# Patient Record
Sex: Female | Born: 1970 | Race: Black or African American | Hispanic: No | Marital: Single | Smoking: Never smoker
Health system: Southern US, Community
[De-identification: ages and names within clinical notes are randomized; demographics above are authoritative.]

## PROBLEM LIST (undated history)

## (undated) ENCOUNTER — Emergency Department (HOSPITAL_COMMUNITY): Payer: 59

## (undated) DIAGNOSIS — I639 Cerebral infarction, unspecified: Secondary | ICD-10-CM

## (undated) DIAGNOSIS — N939 Abnormal uterine and vaginal bleeding, unspecified: Secondary | ICD-10-CM

## (undated) DIAGNOSIS — J45909 Unspecified asthma, uncomplicated: Secondary | ICD-10-CM

## (undated) HISTORY — DX: Cerebral infarction, unspecified: I63.9

## (undated) HISTORY — PX: FOOT SURGERY: SHX648

## (undated) HISTORY — PX: TUBAL LIGATION: SHX77

---

## 2017-08-23 DIAGNOSIS — J45909 Unspecified asthma, uncomplicated: Secondary | ICD-10-CM | POA: Insufficient documentation

## 2021-05-20 ENCOUNTER — Other Ambulatory Visit: Payer: Self-pay

## 2021-05-20 ENCOUNTER — Ambulatory Visit (HOSPITAL_COMMUNITY)
Admission: RE | Admit: 2021-05-20 | Discharge: 2021-05-20 | Disposition: A | Discharge status: Home / Self Care | Payer: Self-pay | Source: Ambulatory Visit | Attending: Emergency Medicine | Admitting: Emergency Medicine

## 2021-05-20 ENCOUNTER — Encounter (HOSPITAL_COMMUNITY): Payer: Self-pay

## 2021-05-20 VITALS — BP 131/90 | HR 85 | Temp 98.5°F | Resp 17

## 2021-05-20 DIAGNOSIS — N939 Abnormal uterine and vaginal bleeding, unspecified: Secondary | ICD-10-CM | POA: Insufficient documentation

## 2021-05-20 LAB — POCT URINALYSIS DIPSTICK, ED / UC
Bilirubin Urine: NEGATIVE
Glucose, UA: NEGATIVE mg/dL
Ketones, ur: NEGATIVE mg/dL
Nitrite: NEGATIVE
Protein, ur: NEGATIVE mg/dL
Specific Gravity, Urine: 1.015 (ref 1.005–1.030)
Urobilinogen, UA: 1 mg/dL (ref 0.0–1.0)
pH: 6 (ref 5.0–8.0)

## 2021-05-20 LAB — POC URINE PREG, ED: Preg Test, Ur: NEGATIVE

## 2021-05-20 MED ORDER — MEGESTROL ACETATE 40 MG PO TABS: 40.0000 mg | ORAL_TABLET | Freq: Two times a day (BID) | ORAL | 3 refills | Status: DC

## 2021-05-20 NOTE — ED Provider Notes (Signed)
[facility]    CSN: [id number] Arrival date & time: 05/20/21  1646      History   Chief Complaint Chief Complaint  Patient presents with   APPOINTMENT : Vaginal Bleeding    HPI April Reilly is a 50 y.o. female.   Patient here for evaluation of abnormal vaginal bleeding and passing large clots for the past 2 weeks.  Patient reports that she does have uterine fibroids and has had irregular periods in the past.  Reports having a heavy period approximately 3 years ago but states her bleeding was heavy but without the clots.  Reports that she is not having to change tampons or pads frequently.  Denies any dizziness, weakness, fatigue, or paleness.  Patient does report that she moved from [place] several years ago and has not gotten established with a GYN locally.  Denies any trauma, injury, or other precipitating event.  Denies any specific alleviating or aggravating factors.  Denies any fevers, chest pain, shortness of breath, N/V/D, numbness, tingling, weakness, abdominal pain, or headaches.    The history is provided by the patient.   History reviewed. No pertinent past medical history.  There are no problems to display for this patient.   History reviewed. No pertinent surgical history.  OB History   No obstetric history on file.      Home Medications    Prior to Admission medications   Medication Sig Start Date End Date Taking? Authorizing Provider  megestrol (MEGACE) 40 MG tablet Take 1 tablet (40 mg total) by mouth 2 (two) times daily. 05/20/21  Yes Pearson Forster, NP    Family History Family History  Family history unknown: Yes    Social History Social History   Tobacco Use   Smoking status: Never   Smokeless tobacco: Never     Allergies   Patient has no known allergies.   Review of Systems Review of Systems  Genitourinary:  Positive for vaginal bleeding.  All other systems reviewed and are negative.   Physical Exam Triage Vital  Signs ED Triage Vitals  Enc Vitals Group     BP 05/20/21 1719 131/90     Pulse Rate 05/20/21 1719 85     Resp 05/20/21 1719 17     Temp 05/20/21 1719 98.5 F (36.9 C)     Temp Source 05/20/21 1719 Oral     SpO2 05/20/21 1719 100 %     Weight --      Height --      Head Circumference --      Peak Flow --      Pain Score 05/20/21 1724 2     Pain Loc --      Pain Edu? --      Excl. in [place]? --    No data found.  Updated Vital Signs BP 131/90 (BP Location: Left Arm)   Pulse 85   Temp 98.5 F (36.9 C) (Oral)   Resp 17   LMP 05/04/2021 Comment: Continous bleeding from 04/30/2021  SpO2 100%   Visual Acuity Right Eye Distance:   Left Eye Distance:   Bilateral Distance:    Right Eye Near:   Left Eye Near:    Bilateral Near:     Physical Exam Vitals and nursing note reviewed.  Constitutional:      General: She is not in acute distress.    Appearance: Normal appearance. She is not ill-appearing, toxic-appearing or diaphoretic.  HENT:     Head: Normocephalic  and atraumatic.  Eyes:     Conjunctiva/sclera: Conjunctivae normal.  Cardiovascular:     Rate and Rhythm: Normal rate.     Pulses: Normal pulses.  Pulmonary:     Effort: Pulmonary effort is normal.  Abdominal:     General: Abdomen is flat.     Tenderness: There is no right CVA tenderness or left CVA tenderness.  Musculoskeletal:        General: Normal range of motion.     Cervical back: Normal range of motion.  Skin:    General: Skin is warm and dry.  Neurological:     General: No focal deficit present.     Mental Status: She is alert and oriented to person, place, and time.  Psychiatric:        Mood and Affect: Mood normal.     UC Treatments / Results  Labs (all labs ordered are listed, but only abnormal results are displayed) Labs Reviewed  POCT URINALYSIS DIPSTICK, ED / UC - Abnormal; Notable for the following components:      Result Value   Hgb urine dipstick LARGE (*)    Leukocytes,Ua SMALL (*)     All other components within normal limits  URINE CULTURE  POC URINE PREG, ED    EKG   Radiology No results found.  Procedures Procedures (including critical care time)  Medications Ordered in UC Medications - No data to display  Initial Impression / Assessment and Plan / UC Course  I have reviewed the triage vital signs and the nursing notes.  Pertinent labs & imaging results that were available during my care of the patient were reviewed by me and considered in my medical decision making (see chart for details).    Assessment negative for red flags or concerns.  Urinalysis was positive for hemoglobin and leukocytes so we will obtain a urine culture.  Patient declined CBC to check hemoglobin and as she denies any dizziness or fatigue and is not pale I think her hemoglobin is likely fine.  We will treat abnormal vaginal bleeding with Megace twice daily.  Patient was instructed to continue medication until she has been seen by a gynecologist.  Referral to OB/GYN placed. Final Clinical Impressions(s) / UC Diagnoses   Final diagnoses:  Abnormal vaginal bleeding     Discharge Instructions      Take the Megace twice a day until you are seen by a gynecologist.  You can take Tylenol and/or ibuprofen as needed for pain relief and fever reduction. Make sure you are drinking plenty of fluids, especially water.  Follow-up with the gynecologist as soon as possible for reevaluation.  A referral has been placed so someone should be contacting you in the next few days but if you do not hear anything please call the number provided.     ED Prescriptions     Medication Sig Dispense Auth. Provider   megestrol (MEGACE) 40 MG tablet Take 1 tablet (40 mg total) by mouth 2 (two) times daily. 60 tablet Pearson Forster, NP      PDMP not reviewed this encounter.   Pearson Forster, NP 05/20/21 [phone]

## 2021-05-20 NOTE — Discharge Instructions (Addendum)
Take the Megace twice a day until you are seen by a gynecologist.  You can take Tylenol and/or ibuprofen as needed for pain relief and fever reduction. Make sure you are drinking plenty of fluids, especially water.  Follow-up with the gynecologist as soon as possible for reevaluation.  A referral has been placed so someone should be contacting you in the next few days but if you do not hear anything please call the number provided.

## 2021-05-20 NOTE — ED Triage Notes (Signed)
Pt presents with abnormal vaginal bleeding for over 2 weeks with passing huge clots.

## 2021-05-21 LAB — URINE CULTURE

## 2021-08-21 ENCOUNTER — Encounter (HOSPITAL_COMMUNITY): Payer: Self-pay

## 2021-08-21 ENCOUNTER — Emergency Department (HOSPITAL_COMMUNITY): Payer: 59

## 2021-08-21 ENCOUNTER — Emergency Department (HOSPITAL_COMMUNITY)
Admission: EM | Admit: 2021-08-21 | Discharge: 2021-08-21 | Disposition: A | Discharge status: Home / Self Care | Payer: 59 | Attending: Emergency Medicine | Admitting: Emergency Medicine

## 2021-08-21 ENCOUNTER — Other Ambulatory Visit: Payer: Self-pay

## 2021-08-21 DIAGNOSIS — R000 Tachycardia, unspecified: Secondary | ICD-10-CM | POA: Insufficient documentation

## 2021-08-21 DIAGNOSIS — N938 Other specified abnormal uterine and vaginal bleeding: Secondary | ICD-10-CM | POA: Diagnosis not present

## 2021-08-21 DIAGNOSIS — D649 Anemia, unspecified: Secondary | ICD-10-CM | POA: Diagnosis not present

## 2021-08-21 DIAGNOSIS — R110 Nausea: Secondary | ICD-10-CM | POA: Insufficient documentation

## 2021-08-21 DIAGNOSIS — R1032 Left lower quadrant pain: Secondary | ICD-10-CM | POA: Diagnosis present

## 2021-08-21 DIAGNOSIS — R102 Pelvic and perineal pain: Secondary | ICD-10-CM

## 2021-08-21 HISTORY — DX: Unspecified asthma, uncomplicated: J45.909

## 2021-08-21 LAB — COMPREHENSIVE METABOLIC PANEL
ALT: 16 U/L (ref 0–44)
AST: 25 U/L (ref 15–41)
Albumin: 4.1 g/dL (ref 3.5–5.0)
Alkaline Phosphatase: 63 U/L (ref 38–126)
Anion gap: 8 (ref 5–15)
BUN: 10 mg/dL (ref 6–20)
CO2: 21 mmol/L — ABNORMAL LOW (ref 22–32)
Calcium: 9.2 mg/dL (ref 8.9–10.3)
Chloride: 108 mmol/L (ref 98–111)
Creatinine, Ser: 0.77 mg/dL (ref 0.44–1.00)
GFR, Estimated: >60 mL/min (ref >60)
Glucose, Bld: 102 mg/dL — ABNORMAL HIGH (ref 70–99)
Potassium: 3.7 mmol/L (ref 3.5–5.1)
Sodium: 137 mmol/L (ref 135–145)
Total Bilirubin: 0.9 mg/dL (ref 0.3–1.2)
Total Protein: 8.3 g/dL — ABNORMAL HIGH (ref 6.5–8.1)

## 2021-08-21 LAB — CBC
HCT: 29.7 % — ABNORMAL LOW (ref 36.0–46.0)
Hemoglobin: 8.8 g/dL — ABNORMAL LOW (ref 12.0–15.0)
MCH: 19.5 pg — ABNORMAL LOW (ref 26.0–34.0)
MCHC: 29.6 g/dL — ABNORMAL LOW (ref 30.0–36.0)
MCV: 65.7 fL — ABNORMAL LOW (ref 80.0–100.0)
Platelets: 287 10*3/uL (ref 150–400)
RBC: 4.52 MIL/uL (ref 3.87–5.11)
RDW: 19 % — ABNORMAL HIGH (ref 11.5–15.5)
WBC: 15 10*3/uL — ABNORMAL HIGH (ref 4.0–10.5)
nRBC: 0 % (ref 0.0–0.2)

## 2021-08-21 LAB — URINALYSIS, ROUTINE W REFLEX MICROSCOPIC
Bilirubin Urine: NEGATIVE
Glucose, UA: NEGATIVE mg/dL
Ketones, ur: NEGATIVE mg/dL
Leukocytes,Ua: NEGATIVE
Nitrite: NEGATIVE
Specific Gravity, Urine: 1.01 (ref 1.005–1.030)
pH: 8 (ref 5.0–8.0)

## 2021-08-21 LAB — WET PREP, GENITAL
Clue Cells Wet Prep HPF POC: NONE SEEN
Sperm: NONE SEEN
Trich, Wet Prep: NONE SEEN
WBC, Wet Prep HPF POC: <10 (ref <10)
Yeast Wet Prep HPF POC: NONE SEEN

## 2021-08-21 LAB — MAGNESIUM: Magnesium: 2 mg/dL (ref 1.7–2.4)

## 2021-08-21 LAB — LACTIC ACID, PLASMA
Lactic Acid, Venous: 2 mmol/L (ref 0.5–1.9)
Lactic Acid, Venous: 1.6 mmol/L (ref 0.5–1.9)

## 2021-08-21 LAB — URINALYSIS, MICROSCOPIC (REFLEX)
RBC / HPF: >50 RBC/hpf (ref 0–5)
WBC, UA: NONE SEEN WBC/hpf (ref 0–5)

## 2021-08-21 LAB — TROPONIN I (HIGH SENSITIVITY)
Troponin I (High Sensitivity): <2 ng/L (ref <18)
Troponin I (High Sensitivity): 2 ng/L (ref <18)

## 2021-08-21 LAB — POC URINE PREG, ED: Preg Test, Ur: NEGATIVE

## 2021-08-21 LAB — LIPASE, BLOOD: Lipase: 46 U/L (ref 11–51)

## 2021-08-21 MED ORDER — ONDANSETRON HCL 4 MG/2ML IJ SOLN: 4.0000 mg | Freq: Once | INTRAMUSCULAR | Status: AC

## 2021-08-21 MED ORDER — LACTATED RINGERS IV BOLUS: 1000.0000 mL | Freq: Once | INTRAVENOUS | Status: DC

## 2021-08-21 MED ORDER — KETOROLAC TROMETHAMINE 15 MG/ML IJ SOLN
15.0000 mg | Freq: Once | INTRAMUSCULAR | Status: AC
  Administered 2021-08-21: 15 mg via INTRAVENOUS
  Filled 2021-08-21: qty 1

## 2021-08-21 MED ORDER — HYDROMORPHONE HCL 1 MG/ML IJ SOLN
1.0000 mg | Freq: Once | INTRAMUSCULAR | Status: AC
  Administered 2021-08-21: 1 mg via INTRAVENOUS
  Filled 2021-08-21: qty 1

## 2021-08-21 MED ORDER — LACTATED RINGERS IV BOLUS
1000.0000 mL | Freq: Once | INTRAVENOUS | Status: AC
  Administered 2021-08-21: 1000 mL via INTRAVENOUS

## 2021-08-21 MED ORDER — IOHEXOL 350 MG/ML SOLN
100.0000 mL | Freq: Once | INTRAVENOUS | Status: AC | PRN
  Administered 2021-08-21: 100 mL via INTRAVENOUS

## 2021-08-21 MED ORDER — LACTATED RINGERS IV BOLUS
1000.0000 mL | Freq: Once | INTRAVENOUS | Status: AC
Start: 2021-08-21 — End: 2021-08-21
  Administered 2021-08-21: 1000 mL via INTRAVENOUS

## 2021-08-21 MED ORDER — OXYCODONE-ACETAMINOPHEN 5-325 MG PO TABS: 1.0000 | ORAL_TABLET | Freq: Three times a day (TID) | ORAL | 0 refills | Status: DC | PRN

## 2021-08-21 MED ORDER — ONDANSETRON HCL 4 MG/2ML IJ SOLN
INTRAMUSCULAR | Status: AC
  Administered 2021-08-21: 4 mg via INTRAVENOUS
  Filled 2021-08-21: qty 2

## 2021-08-21 MED ORDER — ONDANSETRON HCL 4 MG/2ML IJ SOLN
4.0000 mg | Freq: Once | INTRAMUSCULAR | Status: AC
  Administered 2021-08-21: 4 mg via INTRAVENOUS
  Filled 2021-08-21: qty 2

## 2021-08-21 NOTE — ED Notes (Signed)
Patient Alert and oriented to baseline. Stable and ambulatory to baseline. Patient verbalized understanding of the discharge instructions.  Patient belongings were taken by the patient.   

## 2021-08-21 NOTE — ED Triage Notes (Signed)
Patient complaining of lower left quadrant abdominal pain with nausea and back pain that started the previous day. Has had abnormal vaginal bleeding for the past couple months.

## 2021-08-21 NOTE — ED Provider Notes (Signed)
[facility] EMERGENCY DEPARTMENT Provider Note   CSN: [id number] Arrival date & time: 08/21/21  0160     History  Chief Complaint  Patient presents with   Abdominal Pain    April Reilly is a 51 y.o. female.   Abdominal Pain Associated symptoms: nausea   Patient presents for left lower quadrant abdominal pain and nausea.  Medical history is notable for abnormal uterine bleeding for the past 3 months.  Patient was previously living in [place] and moved to the local area within the last 2 years.  She was seen by OB/GYN in October for abnormal vaginal bleeding.  She was started on Megace which she continues to take.  She denies any other daily medications.  She has had improvement in her uterine bleeding but states that she still has persistent spotting.  Yesterday, while at rest, she developed the acute onset of left lower quadrant/left pelvic pain.  This pain has been persistent since that time.  She has had some associated nausea but has not had vomiting.  She has not had any fevers or chills.  Pain does radiate to the area of her lower back.  She has not had any recent dysuria.  She has never experienced pain like this before.  She still has all of her organs.  She has undergone tubal ligation in the past.    Home Medications Prior to Admission medications   Medication Sig Start Date End Date Taking? Authorizing Provider  ibuprofen (ADVIL) 200 MG tablet Take 200 mg by mouth every 6 (six) hours as needed for mild pain.   Yes [provider]  megestrol (MEGACE) 40 MG tablet Take 1 tablet (40 mg total) by mouth 2 (two) times daily. 05/20/21  Yes Pearson Forster, NP  oxyCODONE-acetaminophen (PERCOCET/ROXICET) 5-325 MG tablet Take 1-2 tablets by mouth every 8 (eight) hours as needed for severe pain. 08/21/21  Yes Davonna Belling, MD      Allergies    Patient has no known allergies.    Review of Systems   Review of Systems  Gastrointestinal:  Positive for abdominal pain and  nausea.  Genitourinary:  Positive for pelvic pain.  Musculoskeletal:  Positive for back pain.  All other systems reviewed and are negative.  Physical Exam Updated Vital Signs BP 109/73    Pulse (!) 101    Temp 99.4 F (37.4 C) (Oral)    Resp 18    Ht 4\' 11"  (1.499 m)    Wt 66.7 kg    SpO2 99%    BMI 29.69 kg/m  Physical Exam Vitals and nursing note reviewed. Exam conducted with a chaperone present.  Constitutional:      General: She is not in acute distress.    Appearance: She is well-developed and normal weight. She is not ill-appearing, toxic-appearing or diaphoretic.  HENT:     Head: Normocephalic and atraumatic.     Mouth/Throat:     Mouth: Mucous membranes are moist.     Pharynx: Oropharynx is clear.  Eyes:     General: No scleral icterus.    Conjunctiva/sclera: Conjunctivae normal.     Pupils: Pupils are equal, round, and reactive to light.  Cardiovascular:     Rate and Rhythm: Regular rhythm. Tachycardia present.     Heart sounds: No murmur heard. Pulmonary:     Effort: Pulmonary effort is normal. No respiratory distress.     Breath sounds: Normal breath sounds. No wheezing or rales.  Abdominal:  Palpations: Abdomen is soft.     Tenderness: There is abdominal tenderness in the left lower quadrant. There is right CVA tenderness. There is no left CVA tenderness, guarding or rebound.     Hernia: No hernia is present.  Genitourinary:    General: Normal vulva.     Vagina: Normal. No foreign body. No vaginal discharge, erythema or bleeding.     Cervix: No cervical motion tenderness, discharge, friability, erythema or cervical bleeding.  Musculoskeletal:        General: No swelling.     Cervical back: Neck supple.  Skin:    General: Skin is warm and dry.     Capillary Refill: Capillary refill takes less than 2 seconds.  Neurological:     General: No focal deficit present.     Mental Status: She is alert and oriented to person, place, and time.     Cranial Nerves: No  cranial nerve deficit.     Motor: No weakness.  Psychiatric:        Mood and Affect: Mood normal.        Behavior: Behavior normal.    ED Results / Procedures / Treatments   Labs (all labs ordered are listed, but only abnormal results are displayed) Labs Reviewed  COMPREHENSIVE METABOLIC PANEL - Abnormal; Notable for the following components:      Result Value   CO2 21 (*)    Glucose, Bld 102 (*)    Total Protein 8.3 (*)    All other components within normal limits  CBC - Abnormal; Notable for the following components:   WBC 15.0 (*)    Hemoglobin 8.8 (*)    HCT 29.7 (*)    MCV 65.7 (*)    MCH 19.5 (*)    MCHC 29.6 (*)    RDW 19.0 (*)    All other components within normal limits  URINALYSIS, ROUTINE W REFLEX MICROSCOPIC - Abnormal; Notable for the following components:   Hgb urine dipstick LARGE (*)    Protein, ur TRACE (*)    All other components within normal limits  LACTIC ACID, PLASMA - Abnormal; Notable for the following components:   Lactic Acid, Venous 2.0 (*)    All other components within normal limits  URINALYSIS, MICROSCOPIC (REFLEX) - Abnormal; Notable for the following components:   Bacteria, UA RARE (*)    All other components within normal limits  WET PREP, GENITAL  LIPASE, BLOOD  LACTIC ACID, PLASMA  MAGNESIUM  POC URINE PREG, ED  GC/CHLAMYDIA PROBE AMP ([place]) NOT AT [facility]  TROPONIN I (HIGH SENSITIVITY)  TROPONIN I (HIGH SENSITIVITY)    EKG EKG Interpretation  Date/Time:  Tuesday August 21 2021 08:30:54 EST Ventricular Rate:  115 PR Interval:  132 QRS Duration: 94 QT Interval:  324 QTC Calculation: 447 R Axis:   -1 Text Interpretation: Sinus tachycardia Anterior infarct, old Confirmed by Godfrey Pick ([id number]) on 08/21/2021 9:40:05 AM  Radiology US PELVIC COMPLETE WITH TRANSVAGINAL  Result Date: 08/21/2021 CLINICAL DATA:  Left lower quadrant pain, nausea and vomiting EXAM: TRANSABDOMINAL AND TRANSVAGINAL ULTRASOUND OF PELVIS DOPPLER  ULTRASOUND OF OVARIES TECHNIQUE: Both transabdominal and transvaginal ultrasound examinations of the pelvis were performed. Transabdominal technique was performed for global imaging of the pelvis including uterus, ovaries, adnexal regions, and pelvic cul-de-sac. It was necessary to proceed with endovaginal exam following the transabdominal exam to visualize the uterus/endometrium and ovaries. Color and duplex Doppler ultrasound was utilized to evaluate blood flow to the ovaries. COMPARISON:  Same  day CT FINDINGS: Uterus Measurements: 10.3 x 4.7 x 5.5 cm = volume: 140.0 mL. There is a large fundal uterine fibroid measuring 11.5 x 10.6 x 11.5 cm. Endometrium Thickness: 10 mm. This is considered abnormal if postmenopausal and can be normal if premenopausal. Right ovary Not visualized. Left ovary Measurements: 3.0 x 1.2 x 2.9 cm = volume: 5.26 mL. Normal appearance/no adnexal mass. Pulsed Doppler evaluation of both ovaries demonstrates normal low-resistance arterial and venous waveforms. Other findings Trace simple appearing free fluid in the pelvis, likely physiologic. IMPRESSION: No evidence of left-sided ovarian torsion. The right ovary is not visualized. Large fundal uterine fibroid measuring up to 11.5 x 10.6 x 11.5 cm. Endometrial thickness measures 10 mm. This is normal if premenopausal but abnormal if postmenopausal. Electronically Signed   By: Maurine Simmering M.D.   On: 08/21/2021 16:02   CT Angio Chest/Abd/Pel for Dissection W and/or Wo Contrast  Result Date: 08/21/2021 CLINICAL DATA:  Chest pain, abdominal pain, radiating to back EXAM: CT ANGIOGRAPHY CHEST, ABDOMEN AND PELVIS TECHNIQUE: Noncontrast CT of the chest was initially obtained. Multidetector CT imaging through the chest, abdomen and pelvis was performed using the standard protocol during bolus administration of intravenous contrast. Multiplanar reconstructed images and MIPs were obtained and reviewed to evaluate the vascular anatomy. RADIATION DOSE  REDUCTION: This exam was performed according to the departmental dose-optimization program which includes automated exposure control, adjustment of the mA and/or kV according to patient size and/or use of iterative reconstruction technique. CONTRAST:  120mL OMNIPAQUE IOHEXOL 350 MG/ML SOLN COMPARISON:  None. FINDINGS: CTA CHEST FINDINGS Cardiovascular: Heart size normal. No pericardial effusion. RV nondilated. Satisfactory opacification of pulmonary arteries noted, and there is no evidence of pulmonary emboli. Adequate contrast opacification of the thoracic aorta with no evidence of dissection, aneurysm, or stenosis. There is classic 3-vessel brachiocephalic arch anatomy without proximal stenosis. Mediastinum/Nodes: No mediastinal hematoma, mass or adenopathy. Lungs/Pleura: No pleural effusion. Lungs are clear. No pneumothorax. Musculoskeletal: There is a dextroscoliosis of the thoracic spine apex T9 without evident underlying vertebral anomaly. Bilateral breast implants. Review of the MIP images confirms the above findings. CTA ABDOMEN AND PELVIS FINDINGS VASCULAR Aorta: Normal caliber aorta without aneurysm, dissection, vasculitis or significant stenosis. Celiac: Patent without evidence of aneurysm, dissection, vasculitis or significant stenosis. SMA: Patent without evidence of aneurysm, dissection, vasculitis or significant stenosis. Renals: Both renal arteries are patent without evidence of aneurysm, dissection, vasculitis, fibromuscular dysplasia or significant stenosis. IMA: Patent without evidence of aneurysm, dissection, vasculitis or significant stenosis. Inflow: Patent without evidence of aneurysm, dissection, vasculitis or significant stenosis. Markedly enlarged bilateral uterine arteries and distal branches supplying large uterine fibroids. Veins: No obvious venous abnormality within the limitations of this arterial phase study. Review of the MIP images confirms the above findings. NON-VASCULAR  Hepatobiliary: No focal liver abnormality is seen. No gallstones, gallbladder wall thickening, or biliary dilatation. Pancreas: Unremarkable. No pancreatic ductal dilatation or surrounding inflammatory changes. Spleen: Normal in size without focal abnormality. Adrenals/Urinary Tract: Adrenal glands are unremarkable. Kidneys are normal, without renal calculi, focal lesion, or hydronephrosis. Bladder is unremarkable. Stomach/Bowel: Stomach incompletely distended, unremarkable. Small bowel decompressed. Normal appendix. The colon is nondilated, unremarkable. Lymphatic: No abdominal or pelvic adenopathy. Reproductive: Marked uterine enlargement containing multiple low-attenuation lesions presumably fibroids, with enlargement of bilateral uterine arterial supply. No adnexal mass. Other: No ascites.  No free air. Musculoskeletal: There is a mild compensatory levoscoliosis in the lumbar spine. Review of the MIP images confirms the above findings. IMPRESSION: 1. Negative for  acute PE or thoracic aortic dissection. 2. Enlarged myomatous uterus 3. Moderate thoracic dextroscoliosis Electronically Signed   By: Lucrezia Europe M.D.   On: 08/21/2021 10:58    Procedures Procedures    Medications Ordered in ED Medications  HYDROmorphone (DILAUDID) injection 1 mg (1 mg Intravenous Given 08/21/21 1014)  lactated ringers bolus 1,000 mL (0 mLs Intravenous Stopped 08/21/21 1255)  ondansetron (ZOFRAN) injection 4 mg (4 mg Intravenous Given 08/21/21 1014)  iohexol (OMNIPAQUE) 350 MG/ML injection 100 mL (100 mLs Intravenous Contrast Given 08/21/21 1038)  HYDROmorphone (DILAUDID) injection 1 mg (1 mg Intravenous Given 08/21/21 1254)  ketorolac (TORADOL) 15 MG/ML injection 15 mg (15 mg Intravenous Given 08/21/21 1708)  lactated ringers bolus 1,000 mL (0 mLs Intravenous Stopped 08/21/21 1742)  ondansetron (ZOFRAN) injection 4 mg (4 mg Intravenous Given 08/21/21 1635)    ED Course/ Medical Decision Making/ A&P                            Medical Decision Making Amount and/or Complexity of Data Reviewed Labs: ordered. Radiology: ordered.  Risk Prescription drug management.   This patient presents to the ED for concern of left lower quadrant abdominal pain, this involves an extensive number of treatment options, and is a complaint that carries with it a high risk of complications and morbidity.  The differential diagnosis includes diverticulitis, constipation, PID, ovarian torsion, ovarian cystic rupture   Co morbidities that complicate the patient evaluation  Anemia, abnormal uterine bleeding   Additional history obtained:  Additional history obtained from N/A External records from outside source obtained and reviewed including EMR   Lab Tests:  I Ordered, and personally interpreted labs.  The pertinent results include: Anemia, leukocytosis   Imaging Studies ordered:  I ordered imaging studies including CT angio of chest, abdomen, and pelvis; transvaginal pelvic ultrasound I independently visualized and interpreted imaging which showed presence of fibroids without acute findings to explain patient's pain I agree with the radiologist interpretation   Cardiac Monitoring:  The patient was maintained on a cardiac monitor.  I personally viewed and interpreted the cardiac monitored which showed an underlying rhythm of: Sinus rhythm   Medicines ordered and prescription drug management:  I ordered medication including Dilaudid for analgesia, Zofran for nausea; IV fluids for tachycardia; Toradol for further analgesia Reevaluation of the patient after these medicines showed that the patient improved I have reviewed the patients home medicines and have made adjustments as needed  Problem List / ED Course:  Healthy 51 year old female presenting for cute onset of left lower quadrant/left pelvic pain starting yesterday.  She has had ongoing abnormal uterine bleeding for the past 3 months.  She does state that she  has a history of anemia but does not know what her baseline hemoglobin is.  She was previously seen by medical providers in [place].  She moved to the local area 2 years ago.  Since that time, she has only been seen by OB/GYN last October for her abnormal uterine bleeding.  No previous lab work is available in EMR.  On arrival in the ED, she does appear uncomfortable but is otherwise well-appearing.  Dilaudid was ordered for analgesia.  Vital signs are notable for tachycardia.  IV fluids were given.  On exam, she has tenderness in the left pelvic region as well as the right CVA region.  Patient underwent lab work which did not show any evidence of urinary infection.  There was hematuria  which I suspect is secondary to her ongoing, mild uterine bleeding.  Today, patient has a microcytic anemia with a hemoglobin of 8.8.  Patient is not aware of what her baseline hemoglobin is.  She states that the uterine bleeding she has had over the past several months has been minimal with spotting only.  Additional lab findings show a leukocytosis.  This is nonspecific but does raise concern of possible infection.  Given her abdominal pain that radiates to her back, patient underwent a CT angiogram of the chest, abdomen, and pelvis.  This did not show any vessel abnormalities.  It did show evidence of uterine fibroids, which may be related to her pain and is likely related to her ongoing uterine bleeding.  There were, however, no definitive acute findings to explain her pain.  Following this scan, patient had improved but persistent pain.  Additional Dilaudid was given.  Patient underwent a pelvic ultrasound.  Ultrasound was able to visualize the left adnexa and did not show evidence of ovarian torsion.  There was a trace amount of free pelvic fluid which might suggest a cystic rupture causing her pain.  On pelvic exam, with nurse chaperone present, there was a small amount of serosanguineous fluid in the vaginal vault.  No  active bleeding was identified.  There was no evidence of friability or purulence to suggest cervicitis.  Swabs were obtained.  At this time, patient remained tachycardic.  Additional IV fluids were given.  Toradol was ordered for further analgesia.  Care of patient was signed out to oncoming ED provider.   Reevaluation:  After the interventions noted above, I reevaluated the patient and found that they have :improved   Social Determinants of Health:  Patient recently moved to the local area 2 years ago and has not yet established primary care doctor.   Dispostion:  After consideration of the diagnostic results and the patients response to treatment, I feel that the patent would benefit from reassessment.          Final Clinical Impression(s) / ED Diagnoses Final diagnoses:  Pelvic pain  Dysfunctional uterine bleeding  Anemia, unspecified type    Rx / DC Orders ED Discharge Orders          Ordered    oxyCODONE-acetaminophen (PERCOCET/ROXICET) 5-325 MG tablet  Every 8 hours PRN        08/21/21 1821              Godfrey Pick, MD 08/22/21 [phone]

## 2021-08-21 NOTE — ED Provider Notes (Signed)
°  Physical Exam  BP 121/88    Pulse 100    Temp 99.4 F (37.4 C) (Oral)    Resp 18    Ht 4\' 11"  (1.499 m)    Wt 66.7 kg    SpO2 98%    BMI 29.69 kg/m   Physical Exam  Procedures  Procedures  ED Course / MDM    Medical Decision Making Amount and/or Complexity of Data Reviewed Labs: ordered. Radiology: ordered.  Risk Prescription drug management.   Received in signout.  Lower abdominal pain.  Acute.  Also tachycardia.  Has an anemia.  I think most likely chronic.  Has had vaginal bleeding for 3 months now.  Not hypotensive and not dizzy but the tachycardia could be related to it.  CT scan and ultrasound done.  No clear cause of pain found although does have fibroids.  Potentially could have been a cyst.  Will discharge home with OB follow-up.  His microcytic anemia is likely iron deficiency, however patient has states she has not done well taking iron because it will constipate her.  With the abdominal pain she is been having will not add iron back on at this time.  Will discharge.       Davonna Belling, MD 08/21/21 [phone]

## 2021-08-22 LAB — GC/CHLAMYDIA PROBE AMP (~~LOC~~) NOT AT ARMC
Chlamydia: NEGATIVE
Comment: NEGATIVE
Comment: NORMAL
Neisseria Gonorrhea: NEGATIVE

## 2021-09-10 ENCOUNTER — Inpatient Hospital Stay (HOSPITAL_COMMUNITY): Payer: 59

## 2021-09-10 ENCOUNTER — Inpatient Hospital Stay (HOSPITAL_COMMUNITY)
Admission: EM | Admit: 2021-09-10 | Discharge: 2021-09-19 | DRG: 064 | Disposition: A | Discharge status: Rehabilitation Facility | Payer: 59 | Attending: Internal Medicine | Admitting: Internal Medicine

## 2021-09-10 ENCOUNTER — Emergency Department (HOSPITAL_COMMUNITY): Payer: 59

## 2021-09-10 ENCOUNTER — Inpatient Hospital Stay: Payer: Self-pay

## 2021-09-10 DIAGNOSIS — R509 Fever, unspecified: Secondary | ICD-10-CM

## 2021-09-10 DIAGNOSIS — D571 Sickle-cell disease without crisis: Secondary | ICD-10-CM | POA: Diagnosis present

## 2021-09-10 DIAGNOSIS — I676 Nonpyogenic thrombosis of intracranial venous system: Secondary | ICD-10-CM | POA: Diagnosis present

## 2021-09-10 DIAGNOSIS — D573 Sickle-cell trait: Secondary | ICD-10-CM | POA: Diagnosis not present

## 2021-09-10 DIAGNOSIS — J189 Pneumonia, unspecified organism: Secondary | ICD-10-CM

## 2021-09-10 DIAGNOSIS — R5381 Other malaise: Secondary | ICD-10-CM | POA: Diagnosis not present

## 2021-09-10 DIAGNOSIS — G936 Cerebral edema: Secondary | ICD-10-CM | POA: Diagnosis not present

## 2021-09-10 DIAGNOSIS — I10 Essential (primary) hypertension: Secondary | ICD-10-CM | POA: Diagnosis present

## 2021-09-10 DIAGNOSIS — M79661 Pain in right lower leg: Secondary | ICD-10-CM | POA: Diagnosis not present

## 2021-09-10 DIAGNOSIS — R401 Stupor: Secondary | ICD-10-CM

## 2021-09-10 DIAGNOSIS — E785 Hyperlipidemia, unspecified: Secondary | ICD-10-CM | POA: Diagnosis present

## 2021-09-10 DIAGNOSIS — E039 Hypothyroidism, unspecified: Secondary | ICD-10-CM | POA: Diagnosis present

## 2021-09-10 DIAGNOSIS — R000 Tachycardia, unspecified: Secondary | ICD-10-CM | POA: Diagnosis not present

## 2021-09-10 DIAGNOSIS — E1165 Type 2 diabetes mellitus with hyperglycemia: Secondary | ICD-10-CM | POA: Diagnosis not present

## 2021-09-10 DIAGNOSIS — Z79899 Other long term (current) drug therapy: Secondary | ICD-10-CM

## 2021-09-10 DIAGNOSIS — E870 Hyperosmolality and hypernatremia: Secondary | ICD-10-CM | POA: Diagnosis not present

## 2021-09-10 DIAGNOSIS — Z4659 Encounter for fitting and adjustment of other gastrointestinal appliance and device: Secondary | ICD-10-CM

## 2021-09-10 DIAGNOSIS — F32A Depression, unspecified: Secondary | ICD-10-CM | POA: Diagnosis not present

## 2021-09-10 DIAGNOSIS — N939 Abnormal uterine and vaginal bleeding, unspecified: Secondary | ICD-10-CM | POA: Diagnosis not present

## 2021-09-10 DIAGNOSIS — D259 Leiomyoma of uterus, unspecified: Secondary | ICD-10-CM | POA: Diagnosis present

## 2021-09-10 DIAGNOSIS — G9341 Metabolic encephalopathy: Secondary | ICD-10-CM

## 2021-09-10 DIAGNOSIS — I611 Nontraumatic intracerebral hemorrhage in hemisphere, cortical: Secondary | ICD-10-CM | POA: Diagnosis present

## 2021-09-10 DIAGNOSIS — R4182 Altered mental status, unspecified: Secondary | ICD-10-CM

## 2021-09-10 DIAGNOSIS — I6389 Other cerebral infarction: Principal | ICD-10-CM | POA: Diagnosis present

## 2021-09-10 DIAGNOSIS — R197 Diarrhea, unspecified: Secondary | ICD-10-CM | POA: Diagnosis not present

## 2021-09-10 DIAGNOSIS — W06XXXA Fall from bed, initial encounter: Secondary | ICD-10-CM | POA: Diagnosis present

## 2021-09-10 DIAGNOSIS — G935 Compression of brain: Secondary | ICD-10-CM | POA: Diagnosis present

## 2021-09-10 DIAGNOSIS — Z20822 Contact with and (suspected) exposure to covid-19: Secondary | ICD-10-CM | POA: Diagnosis present

## 2021-09-10 DIAGNOSIS — D72829 Elevated white blood cell count, unspecified: Secondary | ICD-10-CM

## 2021-09-10 DIAGNOSIS — J690 Pneumonitis due to inhalation of food and vomit: Secondary | ICD-10-CM | POA: Diagnosis present

## 2021-09-10 DIAGNOSIS — G8194 Hemiplegia, unspecified affecting left nondominant side: Secondary | ICD-10-CM | POA: Diagnosis present

## 2021-09-10 DIAGNOSIS — R29810 Facial weakness: Secondary | ICD-10-CM | POA: Diagnosis present

## 2021-09-10 DIAGNOSIS — Z789 Other specified health status: Secondary | ICD-10-CM

## 2021-09-10 DIAGNOSIS — R29727 NIHSS score 27: Secondary | ICD-10-CM | POA: Diagnosis present

## 2021-09-10 DIAGNOSIS — D509 Iron deficiency anemia, unspecified: Secondary | ICD-10-CM | POA: Diagnosis not present

## 2021-09-10 DIAGNOSIS — R569 Unspecified convulsions: Secondary | ICD-10-CM

## 2021-09-10 DIAGNOSIS — I639 Cerebral infarction, unspecified: Secondary | ICD-10-CM

## 2021-09-10 DIAGNOSIS — G08 Intracranial and intraspinal phlebitis and thrombophlebitis: Secondary | ICD-10-CM

## 2021-09-10 DIAGNOSIS — F419 Anxiety disorder, unspecified: Secondary | ICD-10-CM | POA: Diagnosis present

## 2021-09-10 DIAGNOSIS — R1310 Dysphagia, unspecified: Secondary | ICD-10-CM | POA: Diagnosis present

## 2021-09-10 DIAGNOSIS — R471 Dysarthria and anarthria: Secondary | ICD-10-CM | POA: Diagnosis present

## 2021-09-10 DIAGNOSIS — I619 Nontraumatic intracerebral hemorrhage, unspecified: Secondary | ICD-10-CM

## 2021-09-10 DIAGNOSIS — E872 Acidosis, unspecified: Secondary | ICD-10-CM | POA: Diagnosis present

## 2021-09-10 HISTORY — DX: Abnormal uterine and vaginal bleeding, unspecified: N93.9

## 2021-09-10 LAB — CBC WITH DIFFERENTIAL/PLATELET
Abs Immature Granulocytes: 0.06 10*3/uL (ref 0.00–0.07)
Basophils Absolute: 0 10*3/uL (ref 0.0–0.1)
Basophils Relative: 0 %
Eosinophils Absolute: 0 10*3/uL (ref 0.0–0.5)
Eosinophils Relative: 0 %
HCT: 32 % — ABNORMAL LOW (ref 36.0–46.0)
Hemoglobin: 9.1 g/dL — ABNORMAL LOW (ref 12.0–15.0)
Immature Granulocytes: 1 %
Lymphocytes Relative: 7 %
Lymphs Abs: 0.9 10*3/uL (ref 0.7–4.0)
MCH: 18.8 pg — ABNORMAL LOW (ref 26.0–34.0)
MCHC: 28.4 g/dL — ABNORMAL LOW (ref 30.0–36.0)
MCV: 66 fL — ABNORMAL LOW (ref 80.0–100.0)
Monocytes Absolute: 0.7 10*3/uL (ref 0.1–1.0)
Monocytes Relative: 6 %
Neutro Abs: 11.2 10*3/uL — ABNORMAL HIGH (ref 1.7–7.7)
Neutrophils Relative %: 86 %
Platelets: 402 10*3/uL — ABNORMAL HIGH (ref 150–400)
RBC: 4.85 MIL/uL (ref 3.87–5.11)
RDW: 20.5 % — ABNORMAL HIGH (ref 11.5–15.5)
WBC: 12.9 10*3/uL — ABNORMAL HIGH (ref 4.0–10.5)
nRBC: 0 % (ref 0.0–0.2)

## 2021-09-10 LAB — URINALYSIS, ROUTINE W REFLEX MICROSCOPIC
Bilirubin Urine: NEGATIVE
Glucose, UA: NEGATIVE mg/dL
Ketones, ur: 5 mg/dL — AB
Nitrite: NEGATIVE
Protein, ur: 30 mg/dL — AB
Specific Gravity, Urine: 1.016 (ref 1.005–1.030)
pH: 5 (ref 5.0–8.0)

## 2021-09-10 LAB — COMPREHENSIVE METABOLIC PANEL
ALT: 19 U/L (ref 0–44)
AST: 30 U/L (ref 15–41)
Albumin: 3.9 g/dL (ref 3.5–5.0)
Alkaline Phosphatase: 58 U/L (ref 38–126)
Anion gap: 15 (ref 5–15)
BUN: 8 mg/dL (ref 6–20)
CO2: 18 mmol/L — ABNORMAL LOW (ref 22–32)
Calcium: 9.9 mg/dL (ref 8.9–10.3)
Chloride: 105 mmol/L (ref 98–111)
Creatinine, Ser: 0.79 mg/dL (ref 0.44–1.00)
GFR, Estimated: >60 mL/min (ref >60)
Glucose, Bld: 139 mg/dL — ABNORMAL HIGH (ref 70–99)
Potassium: 3.6 mmol/L (ref 3.5–5.1)
Sodium: 138 mmol/L (ref 135–145)
Total Bilirubin: 1.2 mg/dL (ref 0.3–1.2)
Total Protein: 8.1 g/dL (ref 6.5–8.1)

## 2021-09-10 LAB — RESP PANEL BY RT-PCR (FLU A&B, COVID) ARPGX2
Influenza A by PCR: NEGATIVE
Influenza B by PCR: NEGATIVE
SARS Coronavirus 2 by RT PCR: NEGATIVE

## 2021-09-10 LAB — CK TOTAL AND CKMB (NOT AT ARMC)
CK, MB: 1.6 ng/mL (ref 0.5–5.0)
Relative Index: 0.7 (ref 0.0–2.5)
Total CK: 230 U/L (ref 38–234)

## 2021-09-10 LAB — ECHOCARDIOGRAM COMPLETE
Area-P 1/2: 2.8 cm2
Height: 59 in
S' Lateral: 2.3 cm
Weight: 2186.96 oz

## 2021-09-10 LAB — PROCALCITONIN: Procalcitonin: 0.14 ng/mL

## 2021-09-10 LAB — LACTIC ACID, PLASMA
Lactic Acid, Venous: 4.2 mmol/L (ref 0.5–1.9)
Lactic Acid, Venous: 2.9 mmol/L (ref 0.5–1.9)
Lactic Acid, Venous: 1.2 mmol/L (ref 0.5–1.9)

## 2021-09-10 LAB — SODIUM
Sodium: 178 mmol/L (ref 135–145)
Sodium: 143 mmol/L (ref 135–145)
Sodium: 140 mmol/L (ref 135–145)

## 2021-09-10 LAB — RAPID URINE DRUG SCREEN, HOSP PERFORMED
Amphetamines: NOT DETECTED
Barbiturates: NOT DETECTED
Benzodiazepines: NOT DETECTED
Cocaine: NOT DETECTED
Opiates: NOT DETECTED
Tetrahydrocannabinol: NOT DETECTED

## 2021-09-10 LAB — AMMONIA: Ammonia: 22 umol/L (ref 9–35)

## 2021-09-10 LAB — I-STAT BETA HCG BLOOD, ED (MC, WL, AP ONLY): I-stat hCG, quantitative: <5 m[IU]/mL (ref <5)

## 2021-09-10 LAB — HIV ANTIBODY (ROUTINE TESTING W REFLEX): HIV Screen 4th Generation wRfx: NONREACTIVE

## 2021-09-10 LAB — MRSA NEXT GEN BY PCR, NASAL: MRSA by PCR Next Gen: NOT DETECTED

## 2021-09-10 LAB — PROTIME-INR
INR: 1.2 (ref 0.8–1.2)
Prothrombin Time: 15.5 seconds — ABNORMAL HIGH (ref 11.4–15.2)

## 2021-09-10 LAB — APTT: aPTT: 30 seconds (ref 24–36)

## 2021-09-10 MED ORDER — ACETAMINOPHEN 650 MG RE SUPP
650.0000 mg | RECTAL | Status: DC | PRN
  Administered 2021-09-10 (×2): 650 mg via RECTAL
  Filled 2021-09-10 (×2): qty 1

## 2021-09-10 MED ORDER — SODIUM CHLORIDE 0.9 % IV SOLN
20.0000 mg/kg | Freq: Once | INTRAVENOUS | Status: AC
  Administered 2021-09-10: 1240 mg via INTRAVENOUS
  Filled 2021-09-10: qty 24.8

## 2021-09-10 MED ORDER — SODIUM CHLORIDE 0.9% FLUSH: 10.0000 mL | INTRAVENOUS | Status: DC | PRN

## 2021-09-10 MED ORDER — LACTATED RINGERS IV BOLUS (SEPSIS)
1000.0000 mL | Freq: Once | INTRAVENOUS | Status: AC
  Administered 2021-09-10: 1000 mL via INTRAVENOUS

## 2021-09-10 MED ORDER — ACETAMINOPHEN 325 MG PO TABS
650.0000 mg | ORAL_TABLET | ORAL | Status: DC | PRN
  Administered 2021-09-15 – 2021-09-16 (×2): 650 mg via ORAL
  Filled 2021-09-10 (×3): qty 2

## 2021-09-10 MED ORDER — CHLORHEXIDINE GLUCONATE CLOTH 2 % EX PADS
6.0000 | MEDICATED_PAD | Freq: Every day | CUTANEOUS | Status: DC
  Administered 2021-09-11 – 2021-09-19 (×9): 6 via TOPICAL

## 2021-09-10 MED ORDER — STROKE: EARLY STAGES OF RECOVERY BOOK
Freq: Once | Status: AC
  Filled 2021-09-10: qty 1

## 2021-09-10 MED ORDER — HEPARIN (PORCINE) 25000 UT/250ML-% IV SOLN
850.0000 [IU]/h | INTRAVENOUS | Status: DC
  Administered 2021-09-11: 01:00:00 600 [IU]/h via INTRAVENOUS
  Administered 2021-09-12 – 2021-09-15 (×3): 850 [IU]/h via INTRAVENOUS
  Administered 2021-09-16: 750 [IU]/h via INTRAVENOUS
  Administered 2021-09-17: 850 [IU]/h via INTRAVENOUS
  Filled 2021-09-10 (×7): qty 250

## 2021-09-10 MED ORDER — SENNOSIDES-DOCUSATE SODIUM 8.6-50 MG PO TABS: 1.0000 | ORAL_TABLET | Freq: Every evening | ORAL | Status: DC | PRN

## 2021-09-10 MED ORDER — SODIUM CHLORIDE 0.9 % IV SOLN
2.0000 g | INTRAVENOUS | Status: DC
  Administered 2021-09-10: 2 g via INTRAVENOUS
  Filled 2021-09-10: qty 20

## 2021-09-10 MED ORDER — ACETAMINOPHEN 650 MG RE SUPP
650.0000 mg | Freq: Once | RECTAL | Status: AC
  Administered 2021-09-10: 650 mg via RECTAL
  Filled 2021-09-10: qty 1

## 2021-09-10 MED ORDER — PHENYTOIN SODIUM 50 MG/ML IJ SOLN
100.0000 mg | Freq: Three times a day (TID) | INTRAMUSCULAR | Status: DC
  Administered 2021-09-11 – 2021-09-16 (×16): 100 mg via INTRAVENOUS
  Filled 2021-09-10 (×18): qty 2

## 2021-09-10 MED ORDER — SODIUM CHLORIDE 0.9% FLUSH
10.0000 mL | Freq: Two times a day (BID) | INTRAVENOUS | Status: DC
  Administered 2021-09-10 – 2021-09-11 (×3): 10 mL
  Administered 2021-09-12: 20 mL
  Administered 2021-09-12 – 2021-09-14 (×4): 10 mL
  Administered 2021-09-14: 20 mL
  Administered 2021-09-15 – 2021-09-18 (×6): 10 mL
  Administered 2021-09-18: 30 mL
  Administered 2021-09-19: 10 mL

## 2021-09-10 MED ORDER — ORAL CARE MOUTH RINSE
15.0000 mL | Freq: Two times a day (BID) | OROMUCOSAL | Status: DC
  Administered 2021-09-10 – 2021-09-16 (×13): 15 mL via OROMUCOSAL

## 2021-09-10 MED ORDER — SODIUM CHLORIDE 3 % IV SOLN
INTRAVENOUS | Status: DC
  Filled 2021-09-10 (×3): qty 500

## 2021-09-10 MED ORDER — LORAZEPAM 2 MG/ML IJ SOLN
INTRAMUSCULAR | Status: AC
  Administered 2021-09-10: 2 mg via INTRAVENOUS
  Filled 2021-09-10: qty 1

## 2021-09-10 MED ORDER — CHLORHEXIDINE GLUCONATE 0.12 % MT SOLN
15.0000 mL | Freq: Two times a day (BID) | OROMUCOSAL | Status: DC
  Administered 2021-09-10 – 2021-09-19 (×14): 15 mL via OROMUCOSAL
  Filled 2021-09-10 (×13): qty 15

## 2021-09-10 MED ORDER — LORAZEPAM 2 MG/ML IJ SOLN
INTRAMUSCULAR | Status: AC
  Administered 2021-09-10: 1 mg via INTRAVENOUS
  Filled 2021-09-10: qty 1

## 2021-09-10 MED ORDER — ACETAMINOPHEN 160 MG/5ML PO SOLN
650.0000 mg | ORAL | Status: DC | PRN
  Administered 2021-09-11 – 2021-09-15 (×10): 650 mg
  Filled 2021-09-10 (×10): qty 20.3

## 2021-09-10 MED ORDER — IOHEXOL 350 MG/ML SOLN
75.0000 mL | Freq: Once | INTRAVENOUS | Status: AC | PRN
  Administered 2021-09-10: 75 mL via INTRAVENOUS

## 2021-09-10 MED ORDER — GADOBUTROL 1 MMOL/ML IV SOLN
6.0000 mL | Freq: Once | INTRAVENOUS | Status: AC | PRN
  Administered 2021-09-10: 6 mL via INTRAVENOUS

## 2021-09-10 MED ORDER — PANTOPRAZOLE SODIUM 40 MG IV SOLR
40.0000 mg | INTRAVENOUS | Status: DC
  Administered 2021-09-10 – 2021-09-15 (×6): 40 mg via INTRAVENOUS
  Filled 2021-09-10 (×6): qty 10

## 2021-09-10 MED ORDER — LORAZEPAM 2 MG/ML IJ SOLN: 2.0000 mg | Freq: Once | INTRAMUSCULAR | Status: AC

## 2021-09-10 MED ORDER — LACTATED RINGERS IV SOLN: INTRAVENOUS | Status: DC

## 2021-09-10 MED ORDER — DEXTROSE 5 % IV SOLN
600.0000 mg | Freq: Three times a day (TID) | INTRAVENOUS | Status: DC
  Administered 2021-09-10 – 2021-09-11 (×2): 600 mg via INTRAVENOUS
  Filled 2021-09-10 (×4): qty 12

## 2021-09-10 MED ORDER — LABETALOL HCL 5 MG/ML IV SOLN
10.0000 mg | INTRAVENOUS | Status: DC | PRN
  Administered 2021-09-12: 10 mg via INTRAVENOUS
  Filled 2021-09-10: qty 4

## 2021-09-10 MED ORDER — SODIUM CHLORIDE 0.9 % IV SOLN
500.0000 mg | INTRAVENOUS | Status: DC
  Administered 2021-09-10: 500 mg via INTRAVENOUS
  Filled 2021-09-10 (×2): qty 5

## 2021-09-10 MED ORDER — LORAZEPAM 2 MG/ML IJ SOLN: 1.0000 mg | Freq: Once | INTRAMUSCULAR | Status: AC

## 2021-09-10 NOTE — Progress Notes (Signed)
EEG completed, results pending. 

## 2021-09-10 NOTE — Progress Notes (Signed)
Echocardiogram 2D Echocardiogram has been performed.  Oneal Deputy Jaquitta Dupriest RDCS 09/10/2021, 2:35 PM

## 2021-09-10 NOTE — Plan of Care (Signed)
°  Problem: Education: Goal: Knowledge of General Education information will improve Description: Including pain rating scale, medication(s)/side effects and non-pharmacologic comfort measures Outcome: Progressing   Problem: Coping: Goal: Level of anxiety will decrease Outcome: Progressing   Problem: Safety: Goal: Ability to remain free from injury will improve Outcome: Progressing   Problem: Education: Goal: Knowledge of disease or condition will improve Outcome: Progressing

## 2021-09-10 NOTE — H&P (Addendum)
Admission H&P    Chief Complaint: Altered mental status HPI: April Reilly is an 51 y.o. female with history of sickle cell trait, heavy vaginal bleeding and anemia from uterine fibroids presents after an unwitnessed fall out of bed last night and 3 day history of flu-like symptom and worsening altered mental status. Family reports gradual worsening somnolence for past 3 days and today was minimally arousable. Family reports no significant symptoms from sickle cell trait to their knowledge.   Family at bedside and patient continues to be obtunded.   ED Course: CT Head showed significant edema in R frontal, parietal, occipital and temporal lobe with superimposed hemorrhage. Neurology was consulted due to CT findings. Lactic acid on admission 4.2, now 2.9. Patient on IV azithromycin, ceftriaxone, LR infusion and hypertonic saline solution. Patient to be admitted to neuro ICU.   Past Medical History:  Diagnosis Date   Asthma     Past Surgical History:  Procedure Laterality Date   FOOT SURGERY     TUBAL LIGATION      Family History  Family history unknown: Yes   Social History:  reports that she has never smoked. She has never used smokeless tobacco. She reports current alcohol use. She reports that she does not use drugs.  Allergies: No Known Allergies  (Not in a hospital admission)   ROS: Unable to assess due to obtundation   Physical Examination: Blood pressure (!) 119/97, pulse 81, temperature (!) 101.3 F (38.5 C), temperature source Rectal, resp. rate 19, height 4\' 11"  (1.499 m), weight 66.6 kg, SpO2 97 %.  HEENT-  Normocephalic, no lesions, without obvious abnormality.  Normal external eye and conjunctiva.  Normal TM's bilaterally.  Normal auditory canals and external ears. Normal external nose, mucus membranes and septum.  Normal pharynx. Neck supple with no masses, nodes, nodules or enlargement. Cardiovascular - regular rate and rhythm, S1, S2 normal, no murmur, click, rub  or gallop Lungs - chest clear, no wheezing, rales, normal symmetric air entry, Heart exam - S1, S2 normal, no murmur, no gallop, rate regular Abdomen - soft, non-tender; bowel sounds normal; no masses,  no organomegaly Extremities - less then 2 second capillary refill  Neurologic Examination: Mental Status: Patient is non-arousable and minimally responsive. Does not open eyes to voice or any stimuli. Groans barely audibly to noxious. Will semipurposefully move RUE. Only followed command to squeeze examiner's hand (with her right hand); not following any other commands. Cranial Nerves: Right pupil fluctuates from 5 mm nonreactive to 4 mm sluggishly reactive. Left pupil reactive and 3 mm. When passively elevating lids; disconjugate gaze with eyes near the midline; does not fixate or track.  Minimal facial movement appears symmetric.  Hearing intact to one command. Unable to assess uvula and palate due to mental status Unable to assess shoulder shrug due to noncooperation. Motor/Sensory: Tone is normal. Bulk is normal. Semi purposeful movement to noxious stimuli in BUE but appears to be move RUE more than LUE. Inward rotation and slight flexion at hips and knees to noxious stimuli in BLE, with less brisk movement on the left. Sensation appears intact to noxious stimuli and relatively symmetric in all extremities.   Deep Tendon Reflexes: Brisk in the biceps and patellae bilatearally.   Positive Babinski in Left foot and negative Babinski in right foot. Coordination: Unable to assess due to obtundation. Gait - Unable to assess  Results for orders placed or performed during the hospital encounter of 09/10/21 (from the past 48 hour(s))  Lactic acid,  plasma     Status: Abnormal   Collection Time: 09/10/21  8:45 AM  Result Value Ref Range   Lactic Acid, Venous 4.2 (HH) 0.5 - 1.9 mmol/L    Comment: CRITICAL RESULT CALLED TO, READ BACK BY AND VERIFIED WITH: T.YONGQ,RN 09/10/21 AT 0948  A.HUGHES Performed at [facility], [place] [address] [place], [place] [address]   Comprehensive metabolic panel     Status: Abnormal   Collection Time: 09/10/21  8:47 AM  Result Value Ref Range   Sodium 138 135 - 145 mmol/L   Potassium 3.6 3.5 - 5.1 mmol/L   Chloride 105 98 - 111 mmol/L   CO2 18 (L) 22 - 32 mmol/L   Glucose, Bld 139 (H) 70 - 99 mg/dL    Comment: Glucose reference range applies only to samples taken after fasting for at least 8 hours.   BUN 8 6 - 20 mg/dL   Creatinine, Ser 0.79 0.44 - 1.00 mg/dL   Calcium 9.9 8.9 - 10.3 mg/dL   Total Protein 8.1 6.5 - 8.1 g/dL   Albumin 3.9 3.5 - 5.0 g/dL   AST 30 15 - 41 U/L   ALT 19 0 - 44 U/L   Alkaline Phosphatase 58 38 - 126 U/L   Total Bilirubin 1.2 0.3 - 1.2 mg/dL   GFR, Estimated >60 >60 mL/min    Comment: (NOTE) Calculated using the CKD-EPI Creatinine Equation (2021)    Anion gap 15 5 - 15    Comment: Performed at [facility] [place] [address] [place], [place] [address]  CBC WITH DIFFERENTIAL     Status: Abnormal   Collection Time: 09/10/21  8:47 AM  Result Value Ref Range   WBC 12.9 (H) 4.0 - 10.5 K/uL   RBC 4.85 3.87 - 5.11 MIL/uL   Hemoglobin 9.1 (L) 12.0 - 15.0 g/dL   HCT 32.0 (L) 36.0 - 46.0 %   MCV 66.0 (L) 80.0 - 100.0 fL   MCH 18.8 (L) 26.0 - 34.0 pg   MCHC 28.4 (L) 30.0 - 36.0 g/dL   RDW 20.5 (H) 11.5 - 15.5 %   Platelets 402 (H) 150 - 400 K/uL   nRBC 0.0 0.0 - 0.2 %   Neutrophils Relative % 86 %   Neutro Abs 11.2 (H) 1.7 - 7.7 K/uL   Lymphocytes Relative 7 %   Lymphs Abs 0.9 0.7 - 4.0 K/uL   Monocytes Relative 6 %   Monocytes Absolute 0.7 0.1 - 1.0 K/uL   Eosinophils Relative 0 %   Eosinophils Absolute 0.0 0.0 - 0.5 K/uL   Basophils Relative 0 %   Basophils Absolute 0.0 0.0 - 0.1 K/uL   Immature Granulocytes 1 %   Abs Immature Granulocytes 0.06 0.00 - 0.07 K/uL   Polychromasia PRESENT    Target Cells PRESENT     Comment: Performed at [facility], [place] [address]  [place],  [address]  I-Stat beta hCG blood, ED     Status: None   Collection Time: 09/10/21  8:47 AM  Result Value Ref Range   I-stat hCG, quantitative <5.0 <5 mIU/mL   Comment 3            Comment:   GEST. AGE      CONC.  (mIU/mL)   <=1 WEEK        5 - 50     2 WEEKS       50 - 500     3 WEEKS  100 - 10,000     4 WEEKS     1,000 - 30,000        FEMALE AND NON-PREGNANT FEMALE:     LESS THAN 5 mIU/mL   Ammonia     Status: None   Collection Time: 09/10/21  8:47 AM  Result Value Ref Range   Ammonia 22 9 - 35 umol/L    Comment: Performed at [facility], [place] [address], [place], [place] [address]   CT Head Wo Contrast  Addendum Date: 09/10/2021   ADDENDUM REPORT: 09/10/2021 09:58 ADDENDUM: These results were called by telephone at the time of interpretation on 09/10/2021 at 9:54 am to provider [facility] , who verbally acknowledged these results. Electronically Signed   By: Kellie Simmering D.O.   On: 09/10/2021 09:58   Result Date: 09/10/2021 CLINICAL DATA:  Provided history: Mental status change, unknown cause. EXAM: CT HEAD WITHOUT CONTRAST TECHNIQUE: Contiguous axial images were obtained from the base of the skull through the vertex without intravenous contrast. RADIATION DOSE REDUCTION: This exam was performed according to the departmental dose-optimization program which includes automated exposure control, adjustment of the mA and/or kV according to patient size and/or use of iterative reconstruction technique. COMPARISON:  None. FINDINGS: Brain: Large region of cortical/subcortical edema within the right frontal, parietal, occipital and temporal lobes, as well as right insula and subinsular region. There are superimposed patchy foci of acute parenchymal hemorrhage within the right temporal, occipital and parietal lobes. Moderate-volume extension of hemorrhage into the right lateral ventricle is questioned (see annotations on series 3, image 11). Associated mass effect with  significant partial effacement of the right lateral ventricle. 9 mm leftward midline shift measured at the level of the septum pellucidum. The basal cisterns are effaced. No definite evidence of ventricular entrapment at this time. No extra-axial fluid collection. Vascular: No hyperdense vessel. Skull: Normal. Negative for fracture or focal lesion. Sinuses/Orbits: Visualized orbits show no acute finding. Small volume fluid layering within a posterior left ethmoid air cell. Attempts are being made to reach the ordering provider at this time. IMPRESSION: Large region of cortical/subcortical edema within the right frontal, parietal, occipital and temporal lobes, as well as right insula and subinsular region. Superimposed patchy foci of acute parenchymal hemorrhage within the right parietal, occipital and temporal lobes. These findings are favored to reflect an acute/early subacute infarct from arterial ischemia (with hemorrhagic conversion) or a hemorrhagic infarct due to intracranial venous thrombosis. An MRI of the brain with contrast (including thin-slice [id number] post-contrast imaging for assessment of the intracranial venous structures) is recommended for further evaluation. A CTA of the head/neck should also be considered. Associated mass effect with significant partial effacement of the right lateral ventricle and 9 mm leftward midline shift. The basal cisterns are effaced. Neurosurgical consultation is advised. Moderate-volume extension of hemorrhage into the right lateral ventricle is questioned. No definite evidence of hydrocephalus or ventricular entrapment at this time. Electronically Signed: By: Kellie Simmering D.O. On: 09/10/2021 09:48   DG Chest Port 1 View  Result Date: 09/10/2021 CLINICAL DATA:  Questionable sepsis - evaluate for abnormality EXAM: PORTABLE CHEST 1 VIEW COMPARISON:  None. FINDINGS: Rotated. No consolidation or edema. No pleural effusion. Cardiomediastinal contours are likely within  normal limits for technique. Thoracic spine dextroscoliosis. IMPRESSION: No acute process in the chest. Electronically Signed   By: Macy Mis M.D.   On: 09/10/2021 08:55    Assessment: 51 year old female presenting with large right frontoparietal ischemic infarction  with hemorrhagic conversion, mass effect with right to left midline shift and initial stages of midbrain compression - Exam reveals findings, including obtundation and decreased left sided movement, referable to the large right hemisphere stroke with mass effect. Pupillary asymmetry is noted.  - Seen by Neurosurgery Team who recommend close monitoring and hypertonic saline but no emergent decompressive craniotomy for now, unless she clinically worsens or repeat CT head shows increasing mass effect. - Was not on an anticoagulant at home.  - DDx regarding etiology for ischemic stroke includes cardioembolic, artery-to-artery embolization and in situ thrombosis  Recommendations: - Admitting to the ICU under the Neurology service.  - Hypertonic saline protocol initiated with 3% saline at a rate of 50 cc/hr and goal Na of 150-155 - MRI brain - CTA of head and neck - TTE - Cardiac telemetry - PT/OT/Speech - HgbA1c, fasting lipid panel - Given hemorrhagic conversion on CT, hold off on antiplatelet agents. No anticoagulants. DVT prophylaxis with SCDs - Repeat CT head in 12 hours (ordered for 9:30 PM).  - Discussed with RN to call Neurology STAT for any changes in neurological exam.  - Frequent neuro checks - NPO  - CCM is being consulted for possible intubation given the relatively high risk of acute respiratory decompensation or inability to protect her airway  60 minutes spent in the emergent neurological evaluation and management of this critically ill patient.   Electronically signed: Dr. Kerney Elbe 09/10/2021, 10:16 AM

## 2021-09-10 NOTE — Progress Notes (Signed)
Peripherally Inserted Central Catheter Placement  The IV Nurse has discussed with the patient and/or persons authorized to consent for the patient, the purpose of this procedure and the potential benefits and risks involved with this procedure.  The benefits include less needle sticks, lab draws from the catheter, and the patient may be discharged home with the catheter. Risks include, but not limited to, infection, bleeding, blood clot (thrombus formation), and puncture of an artery; nerve damage and irregular heartbeat and possibility to perform a PICC exchange if needed/ordered by physician.  Alternatives to this procedure were also discussed.  Bard Power PICC patient education guide, fact sheet on infection prevention and patient information card has been provided to patient /or left at bedside.    PICC Placement Documentation  PICC Triple Lumen 09/10/21 Right Brachial 37 cm 3 cm (Active)  Indication for Insertion or Continuance of Line Administration of hyperosmolar/irritating solutions (i.e. TPN, Vancomycin, etc.) 09/10/21 1530  Exposed Catheter (cm) 3 cm 09/10/21 1530  Site Assessment Clean, Dry, Intact 09/10/21 1530  Lumen #1 Status Flushed;Saline locked;Blood return noted 09/10/21 1530  Lumen #2 Status Flushed;Saline locked;Blood return noted 09/10/21 1530  Lumen #3 Status Flushed;Saline locked;Blood return noted 09/10/21 1530  Dressing Type Securing device 09/10/21 1530  Dressing Status Antimicrobial disc in place;Clean, Dry, Intact 09/10/21 1530  Dressing Change Due 09/17/21 09/10/21 [place] 09/10/2021, 3:42 PM

## 2021-09-10 NOTE — ED Triage Notes (Signed)
3 days hx of flu like sx. Here for AMS and decreased LOC. Today, EMS reports GCS of 6. RR in the 30s. Hx of sickle cell. Took double dose of nyquil yesterday.

## 2021-09-10 NOTE — Progress Notes (Signed)
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  No Known Allergies  Patient Measurements: Height: 4\' 11"  (149.9 cm) Weight: 62 kg (136 lb 11 oz) IBW/kg (Calculated) : 43.2 Heparin Dosing Weight: 55kg  Vital Signs: Temp: 101.1 F (38.4 C) (02/20 2100) Temp Source: Bladder (02/20 1245) BP: 140/85 (02/20 2100) Pulse Rate: 107 (02/20 2100)  Labs: Recent Labs    09/10/21 0847 09/10/21 1437 09/10/21 1627  HGB 9.1*  --   --   HCT 32.0*  --   --   PLT 402*  --   --   APTT  --  30  --   LABPROT  --  15.5*  --   INR  --  1.2  --   CREATININE 0.79  --   --   CKTOTAL  --   --  230  CKMB  --   --  1.6    Estimated Creatinine Clearance: 67.3 mL/min (by C-G formula based on SCr of 0.79 mg/dL).   Medical History: Past Medical History:  Diagnosis Date   Asthma     Medications:  Medications Prior to Admission  Medication Sig Dispense Refill Last Dose   ibuprofen (ADVIL) 200 MG tablet Take 200 mg by mouth every 6 (six) hours as needed for mild pain.   09/09/2021   megestrol (MEGACE) 40 MG tablet Take 1 tablet (40 mg total) by mouth 2 (two) times daily. 60 tablet 3 09/09/2021   oxyCODONE-acetaminophen (PERCOCET/ROXICET) 5-325 MG tablet Take 1-2 tablets by mouth every 8 (eight) hours as needed for severe pain. (Patient not taking: Reported on 09/10/2021) 6 tablet 0 Not Taking   Scheduled:   chlorhexidine  15 mL Mouth Rinse BID   [START ON 09/11/2021] Chlorhexidine Gluconate Cloth  6 each Topical Q0600   mouth rinse  15 mL Mouth Rinse q12n4p   pantoprazole (PROTONIX) IV  40 mg Intravenous Q24H   [START ON 09/11/2021] phenytoin (DILANTIN) IV  100 mg Intravenous Q8H   sodium chloride flush  10-40 mL Intracatheter Q12H   Infusions:   acyclovir (ZOVIRAX) </= 700 mg IVPB 112 mL/hr at 09/10/21 1900   azithromycin Stopped (09/10/21 1142)   cefTRIAXone (ROCEPHIN)   IV Stopped (09/10/21 0921)   sodium chloride (hypertonic) 50 mL/hr at 09/10/21 2204    Assessment: 51yo female presents to ED w/ AMS and decreased LOC,  >> CT reveals large right hemisphere stroke with mass effect and hemorrhagic conversion >> now CT venogram shows cerebral venous sinus thromosis >> to begin heparin.  D/w neuro Dr Rory Percy to confirm anticoagulation in this pt with concurrent bleed; will dose and adjust cautiously with close monitoring.  Goal of Therapy:  Heparin level 0.3-0.5 units/ml Monitor platelets by anticoagulation protocol: Yes   Plan:  Heparin infusion at 600 units/hr and monitor heparin levels, CBC, and imaging.  Wynona Neat, PharmD, BCPS  09/10/2021,11:14 PM

## 2021-09-10 NOTE — ED Notes (Signed)
Pt is very tachypneic at 30-40 cpm. O2 at 2LPM [place] initiated for comfort.

## 2021-09-10 NOTE — Consult Note (Signed)
NAME:  April Reilly, MRN:  [id number], DOB:  13-Nov-1970, LOS: 0 ADMISSION DATE:  09/10/2021, CONSULTATION DATE:   REFERRING MD:  stroke team , CHIEF COMPLAINT:  concern for airway protection    History of Present Illness:  51 year old female, presented to ER via EMS 2/20 w/ altered mental status following 3 days of URI symptoms. Family also reporting more sleepy and difficult to arouse over the 3 days as well.  On arrival pt grunting to interactions.  Moving right side only, left hemiparesis.  CT and exam c/w large right frontoparietal infarct w/ hemorrhagic conversion and cerebral edema  Was seen by neuro-surg who recommended supportive care including HT saline. UDS was negative. Started on Rocephin and azithro. Initial lactic acid 4.2; cleared to 2.9 w/ IVFs PCCM asked to assist w/ care.   Pertinent  Medical History  Sickle cell trait  Heavy vaginal bleeding.  Anemia   Significant Hospital Events: Including procedures, antibiotic start and stop dates in addition to other pertinent events   2/20: admitted after being found obtunded and 3d or UR symptoms. CT head -> significant (R) frontal, parietal, occipital and temporal lobe cerebral edema w/ superimposed hemorrhage. Admitted by neuro w/ working dx right frontoparietal infarct w/ hemorrhagic conversion and cerebral edema. Sen by neuro-surg. Recommended HT saline infusion but not emergent decompressive crani. PCCM asked to assist w/ care.   Interim History / Subjective:  A little more reactive now she's in ICU   Objective   Blood pressure 130/83, pulse 67, temperature (Abnormal) 100.5 F (38.1 C), resp. rate (Abnormal) 33, height 4\' 11"  (1.499 m), weight 66.6 kg, SpO2 99 %.        Intake/Output Summary (Last 24 hours) at 09/10/2021 1243 Last data filed at 09/10/2021 1144 Gross per 24 hour  Intake 1572.5 ml  Output 100 ml  Net 1472.5 ml   Filed Weights   09/10/21 0825  Weight: 66.6 kg    Examination: General: 51 year old  female resting in  bed no distress but not interactive  HENT: NCAT no JVD MMM Lungs: some scattered rhonchi. No accessory use Cardiovascular: RRR w/out MRG  Abdomen: soft not tender  Extremities: warm and dry  Neuro: awake. Spontaneously moves all ext but left side weak has right sided gaze preference and right pupil is > in size than left. Not verbal. Not following commands but is purposeful at this point  GU: due to void   Resolved Hospital Problem list     Assessment & Plan:  Acute right frontoparietal CVA w/ hemorrhagic conversion, complicated by cerebral edema and associated brain compression  Plan Admit to neuro ICU  Serial neuro checks HT saline infusion w/ goal Na 150-155 Holding antiplatelet agents d/t hemorrhagic component.  Repeat CT ordered for 2130 SBP goal 130-150   At risk for ineffective airway protection  -currently spontaneously breathing and seems to be protecting airway Plan Pulse ox Am cxr Neuro support   Acute metabolic encephalopathy 2/2 stroke Plan See above   Fever w/ leukocytosis  -family describing 3 days of URI symptoms.  -pcxr clear but she is also risk for aspiration  Plan Repeat am Pcxr  Blood cultures PCT Day 1 azith and ceftriaxone. (Will continue for now but not convinced she has infection)   Mild lactic acidosis.  Clearing nicely w/ IVFs. Ddx sepsis but also consider could she have had a seizure at some point  Plan Check total CKs Avoid hypotension Cultures sent   Difficult IV access  Plan PICC ordered  Best Practice (right click and "Reselect all SmartList Selections" daily)   Diet/type: tubefeeds and NPO DVT prophylaxis: SCD GI prophylaxis: N/A Lines: N/A Foley:  N/A Code Status:  full code Last date of multidisciplinary goals of care discussion [per primary ]  Labs   CBC: Recent Labs  Lab 09/10/21 0847  WBC 12.9*  NEUTROABS 11.2*  HGB 9.1*  HCT 32.0*  MCV 66.0*  PLT 402*    Basic Metabolic Panel: Recent  Labs  Lab 09/10/21 0847 09/10/21 1045  NA 138 178*  K 3.6  --   CL 105  --   CO2 18*  --   GLUCOSE 139*  --   BUN 8  --   CREATININE 0.79  --   CALCIUM 9.9  --    GFR: Estimated Creatinine Clearance: 69.9 mL/min (by C-G formula based on SCr of 0.79 mg/dL). Recent Labs  Lab 09/10/21 0845 09/10/21 0847 09/10/21 1045  WBC  --  12.9*  --   LATICACIDVEN 4.2*  --  2.9*    Liver Function Tests: Recent Labs  Lab 09/10/21 0847  AST 30  ALT 19  ALKPHOS 58  BILITOT 1.2  PROT 8.1  ALBUMIN 3.9   No results for input(s): LIPASE, AMYLASE in the last 168 hours. Recent Labs  Lab 09/10/21 0847  AMMONIA 22    ABG No results found for: PHART, PCO2ART, PO2ART, HCO3, TCO2, ACIDBASEDEF, O2SAT   Coagulation Profile: No results for input(s): INR, PROTIME in the last 168 hours.  Cardiac Enzymes: No results for input(s): CKTOTAL, CKMB, CKMBINDEX, TROPONINI in the last 168 hours.  HbA1C: No results found for: HGBA1C  CBG: No results for input(s): GLUCAP in the last 168 hours.  Review of Systems:   Not able   Past Medical History:  She,  has a past medical history of Asthma.   Surgical History:   Past Surgical History:  Procedure Laterality Date   FOOT SURGERY     TUBAL LIGATION       Social History:   reports that she has never smoked. She has never used smokeless tobacco. She reports current alcohol use. She reports that she does not use drugs.   Family History:  Her Family history is unknown by patient.   Allergies No Known Allergies   Home Medications  Prior to Admission medications   Medication Sig Start Date End Date Taking? Authorizing Provider  ibuprofen (ADVIL) 200 MG tablet Take 200 mg by mouth every 6 (six) hours as needed for mild pain.    [provider]  megestrol (MEGACE) 40 MG tablet Take 1 tablet (40 mg total) by mouth 2 (two) times daily. 05/20/21   Pearson Forster, NP  oxyCODONE-acetaminophen (PERCOCET/ROXICET) 5-325 MG tablet Take  1-2 tablets by mouth every 8 (eight) hours as needed for severe pain. 08/21/21   Davonna Belling, MD     Critical care time: 32 min     Erick Colace ACNP-BC [facility] Pager # [phone] OR # [phone] if no answer

## 2021-09-10 NOTE — Progress Notes (Signed)
Elink following code sepsis °

## 2021-09-10 NOTE — ED Notes (Signed)
IV team at bedside attempting IV and blood draw.

## 2021-09-10 NOTE — Progress Notes (Addendum)
CT venogram with right transverse sinus and sigmoid sinus dural venous sinus thrombus-appears to be subocclusive. This is one where there is already bleed, but we will have to anticoagulate due to the CVST. Will initiate heparin IV stroke protocol. MRI being done-full set of imaging not available yet. Will need review with neuro rads in the morning. Discussed with pharmacist. Repeat CT head at 6 AM  -- Amie Portland, MD Neurologist [facility] Pager: [phone]

## 2021-09-10 NOTE — ED Notes (Signed)
Azithromycin paused and LR IVF continuous paused as pt only has 1 IV in place. IV team consult in place at this time.

## 2021-09-10 NOTE — Progress Notes (Signed)
An USGPIV (ultrasound guided PIV) has been placed for short-term vasopressor infusion. A correctly placed ivWatch must be used when administering Vasopressors. Should this treatment be needed beyond 72 hours, central line access should be obtained.  It will be the responsibility of the bedside nurse to follow best practice to prevent extravasations.   ?

## 2021-09-10 NOTE — Procedures (Signed)
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 EEG to evaluate for seizure  Level of alertness:  lethargic   AEDs during EEG study: None  Technical aspects: This EEG study was done with scalp electrodes positioned according to the 10-20 International system of electrode placement. Electrical activity was acquired at a sampling rate of 500Hz  and reviewed with a high frequency filter of 70Hz  and a low frequency filter of 1Hz . EEG data were recorded continuously and digitally stored.   Description: No clear posterior dominant rhythm was seen. EEG showed continuous generalized and lateralized right hemisphere polymorphic disorganized 6-9hz  theta-alpha activity. Lateralized periodic discharges were noted in right hemisphere, maximal right parieto-occipital region with fluctuating frequency of 0.5 to 1.5hz  at times with overriding rhythmicity ( LPD+R) Hyperventilation and photic stimulation were not performed.     ABNORMALITY - Lateralized periodic discharges with overriding rhythmicity ( LPD +R),  right hemisphere, maximal right parieto-occipital region  - Continuous slow, generalized and lateralized right hemisphere   IMPRESSION: This study showed evidence of epileptogenicity and cortical dysfunction arising from right hemisphere, maximal right parieto-occipital region. Likely due to underlying hemorrhage. This EEG pattern is on the ictal-interictal continuum with higher potential for seizure recurrence. Additionally there is moderate diffuse encephalopathy, nonspecific etiology.  No definite seizures were seen throughout the recording.  Tarell Schollmeyer Barbra Sarks

## 2021-09-10 NOTE — ED Notes (Signed)
Dr.Lindzen was at bedside and noted to have right pupil changed from 34mm to 79mm in a few minutes to the right side. Left pupil remains at 94mm at this. Pt remains unresponsive and moves on painful stimulation. Will continue to monitor.

## 2021-09-10 NOTE — ED Notes (Signed)
Attempted IV twice with no success. Phlebotomist took blood at this time for labs.

## 2021-09-10 NOTE — Progress Notes (Signed)
Pharmacy Antibiotic Note  April Reilly is a 51 y.o. 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 Pharmacy has been consulted for acyclovir dosing with reduced maintenance fluid at 50 ml/hr.  ClCr ~ 67 ml/min. Patient is on 3% NaCl 50 ml/hr, will not add additional IV fluids.   Plan: Acyclovir 10mg /kg = 600 mg Q 8 hr Continue 3% NaCl per team; start IVF if off 3% NaCl Monitor cultures, clinical status, renal function, duration    Height: 4\' 11"  (149.9 cm) Weight: 62 kg (136 lb 11 oz) IBW/kg (Calculated) : 43.2  Temp (24hrs), Avg:101 F (38.3 C), Min:100.1 F (37.8 C), Max:101.3 F (38.5 C)  Recent Labs  Lab 09/10/21 0845 09/10/21 0847 09/10/21 1045  WBC  --  12.9*  --   CREATININE  --  0.79  --   LATICACIDVEN 4.2*  --  2.9*    Estimated Creatinine Clearance: 67.3 mL/min (by C-G formula based on SCr of 0.79 mg/dL).    No Known Allergies  Antimicrobials this admission: Azithro 2/20 >>  CTX 2/20>> Acyclovir 2/20 >>    Microbiology results: 2/20 BCx: ngtd 2/20 UCx: pend  2/20 MRSA PCR: neg   Thank you for allowing pharmacy to be a part of this patients care.  Benetta Spar, PharmD, BCPS, BCCP Clinical Pharmacist  Please check AMION for all [place] phone numbers After 10:00 PM, call [place] [phone]

## 2021-09-10 NOTE — Progress Notes (Signed)
Notified bedside nurse of need to draw repeat lactic acid. 

## 2021-09-10 NOTE — Consult Note (Incomplete)
NAME:  April Reilly, MRN:  [id number], DOB:  1971/04/27, LOS: 0 ADMISSION DATE:  09/10/2021, CONSULTATION DATE:  *** REFERRING MD:  ***, CHIEF COMPLAINT:  ***   History of Present Illness:  History obtained from chart review EMS and family due to patient unable to give history.  50yoF with 3 day hx of FLU like symptoms/URI symptoms presenting with EMS for altered mental status and decreased LOC. Family reported that over the last few days patient has had gradually worsening somnolence and prior calling EMS was having difficulty arousing her altogether. EMS reported tachypnea, somnolence; blood pressure, heart rate and oxygen saturation on room within normal limits. On presentation patient is not answering any questions and has a wet sounding cough on exam.   Head CT significant edema, shift and evidence of bleeding. Radiology reported a large region of cortical subcortical edema within the right frontal parietal occipital and temporal lobes as well as right insula and subinsular region with patchy foci of acute parenchymal hemorrhage in the same areas. Associated mass effect with significant partial effacement of the right lateral ventricle and 9 mm leftward midline shift. Neurology consulted due to CT findings.   Pertinent  Medical History  Microcytic anemia (from vaginal bleeding), Sickle cell, uterine fibroids, irregular menses, asthma, heavy vaginal and anemia since October 2022.   Significant Hospital Events: Including procedures, antibiotic start and stop dates in addition to other pertinent events   2/20 PCCM consulted related to ICU admission. Neuro exam completed by Dr. Cheral Marker on exam noted left pupil 3 mm and reactive, right pupil 4 mm and sluggishly reactive.   Interim History / Subjective:    Objective   Blood pressure 130/83, pulse 67, temperature (!) 100.5 F (38.1 C), resp. rate (!) 33, height 4\' 11"  (1.499 m), weight 66.6 kg, SpO2 99 %.        Intake/Output Summary (Last  24 hours) at 09/10/2021 1245 Last data filed at 09/10/2021 1144 Gross per 24 hour  Intake 1572.5 ml  Output 100 ml  Net 1472.5 ml   Filed Weights   09/10/21 0825  Weight: 66.6 kg    Examination: General: *** HENT: *** Lungs: *** Cardiovascular: *** Abdomen: *** Extremities: *** Neuro: *** GU: ***  Resolved Hospital Problem list     Assessment & Plan:  Present on Admission:  Stroke (cerebrum) ([place])   Ischemic infarction with hemorrhagic conversion, right to left midline shift and initial stages of midbrain compression Early neurologic deterioration related to cerebral edema  Plan Repeat Head CT and MRI pending Hypertonic saline  Neurosurgery consulted Neuro check Q1hr PT/OT/SLP  Fluid and electrolyte imbalance Hypernatremia Plan  Trend serum sodium Q6hr serum sodium checks while on hypertonic saline  Lactic acidosis etiology unclear could be likely underlying infection given recent URI infection on exam febrile with Tmax 38.5 but can not rule out unwitnessed seizure activity. Lactic acid trending down 2.9<4.2 Plan  ECHO results pending Trend lactic acid levels Abx Azithromycin 500mg  IV (2/20) Ceftriaxone 2gm IV (2/20) BC pending CXR pending  Best Practice (right click and "Reselect all SmartList Selections" daily)   Diet/type: {diet DPOE:42353} DVT prophylaxis: {anticoagulation (Optional):25687} GI prophylaxis: {IR:44315} Lines: {Central Venous Access:25771} Foley:  {Central Venous Access:25691} Code Status:  {Code Status:26939} Last date of multidisciplinary goals of care discussion []   Labs   CBC: Recent Labs  Lab 09/10/21 0847  WBC 12.9*  NEUTROABS 11.2*  HGB 9.1*  HCT 32.0*  MCV 66.0*  PLT 402*    Basic Metabolic Panel:  Recent Labs  Lab 09/10/21 0847 09/10/21 1045  NA 138 178*  K 3.6  --   CL 105  --   CO2 18*  --   GLUCOSE 139*  --   BUN 8  --   CREATININE 0.79  --   CALCIUM 9.9  --    GFR: Estimated Creatinine Clearance:  69.9 mL/min (by C-G formula based on SCr of 0.79 mg/dL). Recent Labs  Lab 09/10/21 0845 09/10/21 0847 09/10/21 1045  WBC  --  12.9*  --   LATICACIDVEN 4.2*  --  2.9*    Liver Function Tests: Recent Labs  Lab 09/10/21 0847  AST 30  ALT 19  ALKPHOS 58  BILITOT 1.2  PROT 8.1  ALBUMIN 3.9   No results for input(s): LIPASE, AMYLASE in the last 168 hours. Recent Labs  Lab 09/10/21 0847  AMMONIA 22    ABG No results found for: PHART, PCO2ART, PO2ART, HCO3, TCO2, ACIDBASEDEF, O2SAT   Coagulation Profile: No results for input(s): INR, PROTIME in the last 168 hours.  Cardiac Enzymes: No results for input(s): CKTOTAL, CKMB, CKMBINDEX, TROPONINI in the last 168 hours.  HbA1C: No results found for: HGBA1C  CBG: No results for input(s): GLUCAP in the last 168 hours.  Review of Systems:     Past Medical History:  She,  has a past medical history of Asthma.   Surgical History:   Past Surgical History:  Procedure Laterality Date   FOOT SURGERY     TUBAL LIGATION       Social History:   reports that she has never smoked. She has never used smokeless tobacco. She reports current alcohol use. She reports that she does not use drugs.   Family History:  Her Family history is unknown by patient.   Allergies No Known Allergies   Home Medications  Prior to Admission medications   Medication Sig Start Date End Date Taking? Authorizing Provider  ibuprofen (ADVIL) 200 MG tablet Take 200 mg by mouth every 6 (six) hours as needed for mild pain.    [provider]  megestrol (MEGACE) 40 MG tablet Take 1 tablet (40 mg total) by mouth 2 (two) times daily. 05/20/21   Pearson Forster, NP  oxyCODONE-acetaminophen (PERCOCET/ROXICET) 5-325 MG tablet Take 1-2 tablets by mouth every 8 (eight) hours as needed for severe pain. 08/21/21   Davonna Belling, MD     Critical care time: ***

## 2021-09-10 NOTE — Procedures (Signed)
Cortrak ° °Person Inserting Tube:  April Reilly, RD °Tube Type:  Cortrak - 43 inches °Tube Size:  10 °Tube Location:  Left nare °Initial Placement:  Stomach °Secured by: Bridle °Technique Used to Measure Tube Placement:  Marking at nare/corner of mouth °Cortrak Secured At:  66 cm ° °Cortrak Tube Team Note: ° °Consult received to place a Cortrak feeding tube.  ° °X-ray is required, abdominal x-ray has been ordered by the Cortrak team. Please confirm tube placement before using the Cortrak tube.  ° °If the tube becomes dislodged please keep the tube and contact the Cortrak team at [email or web address] (password [email or web address]) for replacement.  °If after hours and replacement cannot be delayed, place a NG tube and confirm placement with an abdominal x-ray.  ° ° °April Reilly P., RD, LDN, CNSC °See AMiON for contact information  ° ° °

## 2021-09-10 NOTE — Progress Notes (Signed)
°  Transition of Care [facility]) Screening Note   Patient Details  Name: April Reilly Date of Birth: Jul 16, 1971   Transition of Care [facility]) CM/SW Contact:    Ella Bodo, RN Phone Number: 09/10/2021, 2:17 PM    Transition of Care Department [facility]) has reviewed patient and no TOC needs have been identified at this time. We will continue to monitor patient advancement through interdisciplinary progression rounds. If new patient transition needs arise, please place a TOC consult.  Reinaldo Raddle, RN, BSN  Trauma/Neuro ICU Case Manager [phone]

## 2021-09-10 NOTE — ED Notes (Signed)
Attempted x 2 again for IV placement with no success. Pt has a 22g on the hand that is working for the moment. IV team consult placed for a better IV. Will continue to monitor.

## 2021-09-10 NOTE — ED Provider Notes (Signed)
[facility] EMERGENCY DEPARTMENT Provider Note   CSN: [id number] Arrival date & time: 09/10/21  1937     History  Chief Complaint  Patient presents with   Altered Mental Status    April Reilly is a 51 y.o. female.  Patient is a 51 year old female with a history of heavy vaginal bleeding and anemia who is presenting today with EMS for altered mental status and URI symptoms over the last 3 days.  Family reported that over the last few days patient has had gradually worsening somnolence and then today it was difficult to arouse her altogether.  EMS reported tachypnea, somnolence but normal blood pressure, heart rate and oxygen saturation on room air.  Not a lot else is known of the patient at this point.  She is not answering any questions.  She does have a wet sounding cough on exam.  The history is provided by the EMS personnel and a relative. The history is limited by the condition of the patient.  Altered Mental Status     Home Medications Prior to Admission medications   Medication Sig Start Date End Date Taking? Authorizing Provider  ibuprofen (ADVIL) 200 MG tablet Take 200 mg by mouth every 6 (six) hours as needed for mild pain.    [provider]  megestrol (MEGACE) 40 MG tablet Take 1 tablet (40 mg total) by mouth 2 (two) times daily. 05/20/21   Pearson Forster, NP  oxyCODONE-acetaminophen (PERCOCET/ROXICET) 5-325 MG tablet Take 1-2 tablets by mouth every 8 (eight) hours as needed for severe pain. 08/21/21   Davonna Belling, MD      Allergies    Patient has no known allergies.    Review of Systems   Review of Systems  Physical Exam Updated Vital Signs BP (!) 129/102    Pulse 78    Temp (!) 101.3 F (38.5 C) (Rectal)    Resp (!) 37    Ht 4\' 11"  (1.499 m)    Wt 66.6 kg    SpO2 99%    BMI 29.66 kg/m  Physical Exam Vitals and nursing note reviewed.  Constitutional:      General: She is not in acute distress.    Appearance: She is  well-developed.  HENT:     Head: Normocephalic and atraumatic.     Mouth/Throat:     Mouth: Mucous membranes are dry.  Eyes:     Pupils: Pupils are equal, round, and reactive to light.     Comments: Pale conjunctive a  Cardiovascular:     Rate and Rhythm: Normal rate and regular rhythm.     Heart sounds: Normal heart sounds. No murmur heard.   No friction rub.  Pulmonary:     Effort: Pulmonary effort is normal. Tachypnea present.     Breath sounds: Normal breath sounds. Decreased air movement present. No wheezing or rales.  Abdominal:     General: Bowel sounds are normal. There is no distension.     Palpations: Abdomen is soft.     Tenderness: There is no abdominal tenderness. There is no guarding or rebound.  Musculoskeletal:        General: No tenderness. Normal range of motion.     Right lower leg: No edema.     Left lower leg: No edema.     Comments: No edema  Skin:    General: Skin is warm and dry.     Coloration: Skin is pale.     Findings: No rash.  Neurological:     Mental Status: She is lethargic.     Cranial Nerves: No cranial nerve deficit.     Comments: Patient does grunt when speaking to her but will not open her eyes spontaneously.  She is noted to move only the right upper and lower extremity.  She appears flaccid on the left side.  No notable facial droop but patient cannot follow with an exam.  It does appear that she is trying to follow commands intermittently.  Psychiatric:     Comments: Lethargic    ED Results / Procedures / Treatments   Labs (all labs ordered are listed, but only abnormal results are displayed) Labs Reviewed  LACTIC ACID, PLASMA - Abnormal; Notable for the following components:      Result Value   Lactic Acid, Venous 4.2 (*)    All other components within normal limits  COMPREHENSIVE METABOLIC PANEL - Abnormal; Notable for the following components:   CO2 18 (*)    Glucose, Bld 139 (*)    All other components within normal limits   CBC WITH DIFFERENTIAL/PLATELET - Abnormal; Notable for the following components:   WBC 12.9 (*)    Hemoglobin 9.1 (*)    HCT 32.0 (*)    MCV 66.0 (*)    MCH 18.8 (*)    MCHC 28.4 (*)    RDW 20.5 (*)    Platelets 402 (*)    Neutro Abs 11.2 (*)    All other components within normal limits  URINALYSIS, ROUTINE W REFLEX MICROSCOPIC - Abnormal; Notable for the following components:   APPearance HAZY (*)    Hgb urine dipstick MODERATE (*)    Ketones, ur 5 (*)    Protein, ur 30 (*)    Leukocytes,Ua TRACE (*)    Bacteria, UA RARE (*)    All other components within normal limits  RESP PANEL BY RT-PCR (FLU A&B, COVID) ARPGX2  CULTURE, BLOOD (ROUTINE X 2)  CULTURE, BLOOD (ROUTINE X 2)  URINE CULTURE  AMMONIA  LACTIC ACID, PLASMA  PROTIME-INR  APTT  SODIUM  SODIUM  SODIUM  I-STAT BETA HCG BLOOD, ED ([facility], [facility], [facility] ONLY)    EKG EKG Interpretation  Date/Time:  Monday September 10 2021 08:30:10 EST Ventricular Rate:  68 PR Interval:  104 QRS Duration: 98 QT Interval:  372 QTC Calculation: 396 R Axis:   59 Text Interpretation: Sinus rhythm Atrial premature complex Short PR interval Probable left ventricular hypertrophy Confirmed by Blanchie Dessert [phone]) on 09/10/2021 9:39:14 AM  Radiology CT Head Wo Contrast  Addendum Date: 09/10/2021   ADDENDUM REPORT: 09/10/2021 09:58 ADDENDUM: These results were called by telephone at the time of interpretation on 09/10/2021 at 9:54 am to provider [facility] , who verbally acknowledged these results. Electronically Signed   By: Kellie Simmering D.O.   On: 09/10/2021 09:58   Result Date: 09/10/2021 CLINICAL DATA:  Provided history: Mental status change, unknown cause. EXAM: CT HEAD WITHOUT CONTRAST TECHNIQUE: Contiguous axial images were obtained from the base of the skull through the vertex without intravenous contrast. RADIATION DOSE REDUCTION: This exam was performed according to the departmental dose-optimization program which includes  automated exposure control, adjustment of the mA and/or kV according to patient size and/or use of iterative reconstruction technique. COMPARISON:  None. FINDINGS: Brain: Large region of cortical/subcortical edema within the right frontal, parietal, occipital and temporal lobes, as well as right insula and subinsular region. There are superimposed patchy foci of acute parenchymal hemorrhage within the right  temporal, occipital and parietal lobes. Moderate-volume extension of hemorrhage into the right lateral ventricle is questioned (see annotations on series 3, image 11). Associated mass effect with significant partial effacement of the right lateral ventricle. 9 mm leftward midline shift measured at the level of the septum pellucidum. The basal cisterns are effaced. No definite evidence of ventricular entrapment at this time. No extra-axial fluid collection. Vascular: No hyperdense vessel. Skull: Normal. Negative for fracture or focal lesion. Sinuses/Orbits: Visualized orbits show no acute finding. Small volume fluid layering within a posterior left ethmoid air cell. Attempts are being made to reach the ordering provider at this time. IMPRESSION: Large region of cortical/subcortical edema within the right frontal, parietal, occipital and temporal lobes, as well as right insula and subinsular region. Superimposed patchy foci of acute parenchymal hemorrhage within the right parietal, occipital and temporal lobes. These findings are favored to reflect an acute/early subacute infarct from arterial ischemia (with hemorrhagic conversion) or a hemorrhagic infarct due to intracranial venous thrombosis. An MRI of the brain with contrast (including thin-slice X5-TZGYFVCB post-contrast imaging for assessment of the intracranial venous structures) is recommended for further evaluation. A CTA of the head/neck should also be considered. Associated mass effect with significant partial effacement of the right lateral ventricle and  9 mm leftward midline shift. The basal cisterns are effaced. Neurosurgical consultation is advised. Moderate-volume extension of hemorrhage into the right lateral ventricle is questioned. No definite evidence of hydrocephalus or ventricular entrapment at this time. Electronically Signed: By: Kellie Simmering D.O. On: 09/10/2021 09:48   DG Chest Port 1 View  Result Date: 09/10/2021 CLINICAL DATA:  Questionable sepsis - evaluate for abnormality EXAM: PORTABLE CHEST 1 VIEW COMPARISON:  None. FINDINGS: Rotated. No consolidation or edema. No pleural effusion. Cardiomediastinal contours are likely within normal limits for technique. Thoracic spine dextroscoliosis. IMPRESSION: No acute process in the chest. Electronically Signed   By: Macy Mis M.D.   On: 09/10/2021 08:55    Procedures Procedures    Medications Ordered in ED Medications  lactated ringers infusion (0 mLs Intravenous Stopped 09/10/21 1018)  cefTRIAXone (ROCEPHIN) 2 g in sodium chloride 0.9 % 100 mL IVPB (0 g Intravenous Stopped 09/10/21 0921)  azithromycin (ZITHROMAX) 500 mg in sodium chloride 0.9 % 250 mL IVPB (0 mg Intravenous Paused 09/10/21 1019)  sodium chloride (hypertonic) 3 % solution ( Intravenous New Bag/Given 09/10/21 1026)  lactated ringers bolus 1,000 mL (0 mLs Intravenous Stopped 09/10/21 1014)  acetaminophen (TYLENOL) suppository 650 mg (650 mg Rectal Given 09/10/21 4496)    ED Course/ Medical Decision Making/ A&P                           Medical Decision Making Amount and/or Complexity of Data Reviewed Independent Historian: EMS External Data Reviewed: notes. Labs: ordered. Decision-making details documented in ED Course. Radiology: ordered and independent interpretation performed. Decision-making details documented in ED Course. ECG/medicine tests: ordered and independent interpretation performed. Decision-making details documented in ED Course.  Risk OTC drugs. Prescription drug management.   Patient is a  51 year old female with little known history other than prior ER visits that show she has a history of microcytic anemia from vaginal bleeding who is presenting today with EMS for altered mental status.  Additional history was obtained by EMS and family because patient is not able to give any today.  Report of 2 to 3 days of infectious type symptoms.  Patient is febrile here to 101.3 with tachypnea  but normal oxygen saturation and blood pressure.  Concern for sepsis most likely from pneumonia however cannot rule out other infectious etiology such as COVID, meningitis, encephalitis.  Given patient's exam she was given IV fluids but will wait to do 30/kg until initial lactate returns.  She was given PR Tylenol.  We will get additional labs and imaging.  Patient was started on antibiotics focused toward pneumonia given exam findings however may have to be broadened.  11:07 AM I independently interpreted and viewed patient's chest x-ray and head CT.  The chest x-ray is within normal limits.  Head CT was significant edema, shift and evidence of bleeding.  Radiology reported a large region of cortical subcortical edema within the right frontal parietal occipital and temporal lobes as well as right insula and subinsular region with patchy foci of acute parenchymal hemorrhage in the same areas.  They are concerned for an acute, early subacute ischemia versus thrombosis of the venous system.  I independently interpreted patient's labs and EKG and EKG without acute findings today, labs show a minimal leukocytosis of 12 with hemoglobin stable at 9, COVID and flu are negative, lactic acid of 4.2, ammonia within normal limits, hCG negative and CMP without acute findings, UA without significant findings.  Patient had received antibiotics based on her initial evaluation however feel that most of patient's symptoms are all related to her acute intracranial pathology.  I consulted neurosurgery Dr. Trenton Gammon and he will consult on the  patient but at this time is requesting neurology evaluation.  Patient discussed with Dr. Cheral Marker and she was started on hypertonic saline.  She will need to go to the ICU.  At this time she does not appear to need an airway however if she declines further she would need intubation.  Patient's sister is at bedside and all these findings were discussed with her sister.  CRITICAL CARE Performed by: Jenney Brester Total critical care time: 40 minutes Critical care time was exclusive of separately billable procedures and treating other patients. Critical care was necessary to treat or prevent imminent or life-threatening deterioration. Critical care was time spent personally by me on the following activities: development of treatment plan with patient and/or surrogate as well as nursing, discussions with consultants, evaluation of patient's response to treatment, examination of patient, obtaining history from patient or surrogate, ordering and performing treatments and interventions, ordering and review of laboratory studies, ordering and review of radiographic studies, pulse oximetry and re-evaluation of patient's condition.        Final Clinical Impression(s) / ED Diagnoses Final diagnoses:  Cerebrovascular accident (CVA), unspecified mechanism ([place])  Stupor  Nontraumatic cerebral edema ([place])  Right-sided nontraumatic intracerebral hemorrhage, unspecified cerebral location [facility])    Rx / DC Orders ED Discharge Orders     None         Blanchie Dessert, MD 09/10/21 1113

## 2021-09-10 NOTE — Progress Notes (Signed)
LTM EEG hooked up and running - no initial skin breakdown - push button tested - neuro notified. [facility] monitoring.  

## 2021-09-10 NOTE — Progress Notes (Signed)
Re-examined at 1:20 PM. No change to previously documented neurological exam. Left pupil 3 mm and reactive, right pupil 4 mm and sluggishly reactive.   Discussed with family.   Electronically signed: Dr. Kerney Elbe

## 2021-09-11 ENCOUNTER — Inpatient Hospital Stay (HOSPITAL_COMMUNITY): Payer: 59

## 2021-09-11 DIAGNOSIS — I6389 Other cerebral infarction: Principal | ICD-10-CM

## 2021-09-11 DIAGNOSIS — R509 Fever, unspecified: Secondary | ICD-10-CM | POA: Diagnosis not present

## 2021-09-11 DIAGNOSIS — D72829 Elevated white blood cell count, unspecified: Secondary | ICD-10-CM

## 2021-09-11 DIAGNOSIS — G9341 Metabolic encephalopathy: Secondary | ICD-10-CM

## 2021-09-11 DIAGNOSIS — R569 Unspecified convulsions: Secondary | ICD-10-CM | POA: Diagnosis not present

## 2021-09-11 DIAGNOSIS — G08 Intracranial and intraspinal phlebitis and thrombophlebitis: Secondary | ICD-10-CM

## 2021-09-11 DIAGNOSIS — G936 Cerebral edema: Secondary | ICD-10-CM

## 2021-09-11 DIAGNOSIS — R401 Stupor: Secondary | ICD-10-CM | POA: Diagnosis not present

## 2021-09-11 DIAGNOSIS — R4182 Altered mental status, unspecified: Secondary | ICD-10-CM | POA: Diagnosis not present

## 2021-09-11 LAB — BASIC METABOLIC PANEL
Anion gap: 10 (ref 5–15)
BUN: 6 mg/dL (ref 6–20)
BUN: 9 mg/dL (ref 6–20)
CO2: 21 mmol/L — ABNORMAL LOW (ref 22–32)
CO2: 16 mmol/L — ABNORMAL LOW (ref 22–32)
Calcium: 8.7 mg/dL — ABNORMAL LOW (ref 8.9–10.3)
Calcium: 9 mg/dL (ref 8.9–10.3)
Chloride: 115 mmol/L — ABNORMAL HIGH (ref 98–111)
Chloride: >130 mmol/L (ref 98–111)
Creatinine, Ser: 0.75 mg/dL (ref 0.44–1.00)
Creatinine, Ser: 0.73 mg/dL (ref 0.44–1.00)
GFR, Estimated: >60 mL/min (ref >60)
GFR, Estimated: >60 mL/min (ref >60)
Glucose, Bld: 134 mg/dL — ABNORMAL HIGH (ref 70–99)
Glucose, Bld: 161 mg/dL — ABNORMAL HIGH (ref 70–99)
Potassium: 3 mmol/L — ABNORMAL LOW (ref 3.5–5.1)
Potassium: 3.7 mmol/L (ref 3.5–5.1)
Sodium: 146 mmol/L — ABNORMAL HIGH (ref 135–145)
Sodium: 160 mmol/L — ABNORMAL HIGH (ref 135–145)

## 2021-09-11 LAB — CBC
HCT: 26.6 % — ABNORMAL LOW (ref 36.0–46.0)
Hemoglobin: 8 g/dL — ABNORMAL LOW (ref 12.0–15.0)
MCH: 19.2 pg — ABNORMAL LOW (ref 26.0–34.0)
MCHC: 30.1 g/dL (ref 30.0–36.0)
MCV: 63.8 fL — ABNORMAL LOW (ref 80.0–100.0)
Platelets: 325 10*3/uL (ref 150–400)
RBC: 4.17 MIL/uL (ref 3.87–5.11)
RDW: 20 % — ABNORMAL HIGH (ref 11.5–15.5)
WBC: 8.4 10*3/uL (ref 4.0–10.5)
nRBC: 0 % (ref 0.0–0.2)

## 2021-09-11 LAB — URINE CULTURE

## 2021-09-11 LAB — MAGNESIUM
Magnesium: 2.3 mg/dL (ref 1.7–2.4)
Magnesium: 2.4 mg/dL (ref 1.7–2.4)

## 2021-09-11 LAB — PROCALCITONIN: Procalcitonin: 0.11 ng/mL

## 2021-09-11 LAB — GLUCOSE, CAPILLARY
Glucose-Capillary: 143 mg/dL — ABNORMAL HIGH (ref 70–99)
Glucose-Capillary: 158 mg/dL — ABNORMAL HIGH (ref 70–99)
Glucose-Capillary: 165 mg/dL — ABNORMAL HIGH (ref 70–99)
Glucose-Capillary: 149 mg/dL — ABNORMAL HIGH (ref 70–99)

## 2021-09-11 LAB — LIPID PANEL
Cholesterol: 139 mg/dL (ref 0–200)
HDL: 46 mg/dL (ref >40)
LDL Cholesterol: 84 mg/dL (ref 0–99)
Total CHOL/HDL Ratio: 3 RATIO
Triglycerides: 47 mg/dL (ref <150)
VLDL: 9 mg/dL (ref 0–40)

## 2021-09-11 LAB — TSH: TSH: 0.164 u[IU]/mL — ABNORMAL LOW (ref 0.350–4.500)

## 2021-09-11 LAB — VITAMIN B12: Vitamin B-12: 364 pg/mL (ref 180–914)

## 2021-09-11 LAB — PHENYTOIN LEVEL, TOTAL: Phenytoin Lvl: 18.3 ug/mL (ref 10.0–20.0)

## 2021-09-11 LAB — SODIUM
Sodium: 153 mmol/L — ABNORMAL HIGH (ref 135–145)
Sodium: 159 mmol/L — ABNORMAL HIGH (ref 135–145)
Sodium: 161 mmol/L (ref 135–145)

## 2021-09-11 LAB — PHOSPHORUS
Phosphorus: 3.1 mg/dL (ref 2.5–4.6)
Phosphorus: 1.5 mg/dL — ABNORMAL LOW (ref 2.5–4.6)

## 2021-09-11 LAB — HEPARIN LEVEL (UNFRACTIONATED)
Heparin Unfractionated: <0.1 IU/mL — ABNORMAL LOW (ref 0.30–0.70)
Heparin Unfractionated: <0.1 IU/mL — ABNORMAL LOW (ref 0.30–0.70)

## 2021-09-11 MED ORDER — POTASSIUM PHOSPHATES 15 MMOLE/5ML IV SOLN
20.0000 mmol | Freq: Once | INTRAVENOUS | Status: AC
  Administered 2021-09-11: 20 mmol via INTRAVENOUS
  Filled 2021-09-11: qty 6.67

## 2021-09-11 MED ORDER — SODIUM CHLORIDE 0.9 % IV SOLN: INTRAVENOUS | Status: DC

## 2021-09-11 MED ORDER — POTASSIUM CHLORIDE 20 MEQ PO PACK
40.0000 meq | PACK | Freq: Four times a day (QID) | ORAL | Status: AC
  Administered 2021-09-11 (×2): 40 meq
  Filled 2021-09-11 (×2): qty 2

## 2021-09-11 MED ORDER — SODIUM CHLORIDE 0.9 % IV SOLN
INTRAVENOUS | Status: DC
  Filled 2021-09-11 (×3): qty 250

## 2021-09-11 MED ORDER — OSMOLITE 1.5 CAL PO LIQD
1000.0000 mL | ORAL | Status: DC
  Administered 2021-09-11 – 2021-09-12 (×2): 1000 mL

## 2021-09-11 MED ORDER — INSULIN ASPART 100 UNIT/ML IJ SOLN
0.0000 [IU] | INTRAMUSCULAR | Status: DC
  Administered 2021-09-11: 2 [IU] via SUBCUTANEOUS
  Administered 2021-09-12 (×2): 1 [IU] via SUBCUTANEOUS
  Administered 2021-09-12 (×2): 2 [IU] via SUBCUTANEOUS
  Administered 2021-09-12 – 2021-09-13 (×3): 1 [IU] via SUBCUTANEOUS
  Administered 2021-09-13: 2 [IU] via SUBCUTANEOUS
  Administered 2021-09-13: 1 [IU] via SUBCUTANEOUS
  Administered 2021-09-13 – 2021-09-14 (×5): 2 [IU] via SUBCUTANEOUS
  Administered 2021-09-14 – 2021-09-15 (×2): 1 [IU] via SUBCUTANEOUS
  Administered 2021-09-15: 2 [IU] via SUBCUTANEOUS

## 2021-09-11 MED ORDER — PROSOURCE TF PO LIQD
45.0000 mL | Freq: Three times a day (TID) | ORAL | Status: DC
  Administered 2021-09-11 – 2021-09-15 (×13): 45 mL
  Filled 2021-09-11 (×13): qty 45

## 2021-09-11 MED ORDER — SODIUM CHLORIDE 0.9 % IV SOLN
INTRAVENOUS | Status: DC
  Filled 2021-09-11: qty 500

## 2021-09-11 NOTE — Progress Notes (Signed)
EEG maintenance performed.  No skin breakdown observed at electrode sites Fp1, Fp2, F4, T7, A1.

## 2021-09-11 NOTE — Progress Notes (Signed)
SLP Cancellation Note  Patient Details Name: April Reilly MRN: [id number] DOB: [birth year]   Cancelled treatment:       Reason Eval/Treat Not Completed: Patient's level of consciousness. Will f/u for readiness.    Samule Life, Katherene Ponto 09/11/2021, 9:13 AM

## 2021-09-11 NOTE — Progress Notes (Signed)
OT Cancellation Note  Patient Details Name: April Reilly MRN: [id number] DOB: 03-15-1971   Cancelled Treatment:    Reason Eval/Treat Not Completed: Patient not medically ready  Time, OT/L   Acute OT Clinical Specialist Acute Rehabilitation Services Pager [phone] Office [phone]  09/11/2021, 12:48 PM

## 2021-09-11 NOTE — Progress Notes (Signed)
NAME:  April Reilly, MRN:  [id number], DOB:  1971/05/03, LOS: 1 ADMISSION DATE:  09/10/2021, CONSULTATION DATE:  2/20 REFERRING MD:  Stroke team, CHIEF COMPLAINT:  concern for airway protection   History of Present Illness:  51 year old female, presented to ER via EMS 2/20 w/ altered mental status following 3 days of URI symptoms. Family also reporting more sleepy and difficult to arouse over the 3 days as well. On arrival pt grunting to interactions. Moving right side only, left hemiparesis. CT and exam c/w large right frontoparietal infarct w/ hemorrhagic conversion and cerebral edema  Was seen by neuro-surg who recommended supportive care including HT saline. PCCM asked to assist w/ care.   Pertinent  Medical History  Sickle cell trait  Heavy vaginal bleeding.  Anemia   Significant Hospital Events: Including procedures, antibiotic start and stop dates in addition to other pertinent events   2/20: admitted after being found obtunded and 3d of URI symptoms. CT head -> significant (R) frontal, parietal, occipital and temporal lobe cerebral edema w/ superimposed hemorrhage. Admitted by neuro w/ working dx right frontoparietal infarct w/ hemorrhagic conversion and cerebral edema. Sen by neuro-surg. Recommended HT saline infusion but not emergent decompressive crani. PCCM asked to assist w/ care.  2/21 Venogram showed venous sinus thrombosis, heparin started  Interim History / Subjective:  Patient assessed at bedside this AM. She does not open eyes, but moves right arm and leg spontaneously. She mumbles and grunts. Daughters at bedside.  Objective   Blood pressure 138/82, pulse 98, temperature (!) 100.4 F (38 C), resp. rate (!) 33, height 4\' 11"  (1.499 m), weight 62.7 kg, SpO2 100 %.        Intake/Output Summary (Last 24 hours) at 09/11/2021 0844 Last data filed at 09/11/2021 0700 Gross per 24 hour  Intake 2712.73 ml  Output 2115 ml  Net 597.73 ml   Filed Weights   09/10/21 0825  09/10/21 1245 09/11/21 0500  Weight: 66.6 kg 62 kg 62.7 kg    Examination: General appearance: does not open eyes to voice or touch, unable to follow commands Eyes: anicteric sclerae, moist conjunctivae HENT: NCAT  Neck: Trachea midline Lungs: CTAB, no crackles, no wheeze, with normal respiratory effort and no intercostal retractions CV: RRR, S1, S2, no MRGs  Abdomen: Soft, non-tender; non-distended, BS present  Extremities: No peripheral edema, radial and DP pulses present bilaterally  Skin: Normal temperature, turgor and texture; no rash Neuro: moves right upper and lower extremity spontaneously, no movement of left upper or lower extremity  Na 146 K 3.0 CO2 21 Hgb 9.1-> 8.0/ HCT 32-> 26.6 WBC 12.9-> 8.4 Pro cal 0.11  CT head 6AM venous infarction with hemorrhage in right cerebral hemisphere. 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 With venous sinus thrombus, neurosurgery would be extremely risky because she cannot be on Anticoagulation post Crani. Repeat CT head this AM with small increase in edema. Clinically patient is moving more spontaneously and making more sounds. -Serial neuro checks -EEG showed epileptogenicity and cortical dysfunction arising from right hemisphere, no definite seizure. -repeat head CT at 6 AM. 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 Echo showed no evidence of thrombus, EF 60-65%. -Heparin per pharmacy  Viral pneumonia Procal 0.11, repeat CXR this AM with interval development of right lower lobe airspace opacity.  Family describing 3 days of URI symptoms. - discontinue azithromycin and ceftriaxone  Microcytic anemia Baseline Hgb in 8.0 in patient with history of heavy vaginal bleeding. -trend CBC -if hgb <7.0 transfuse -consider iron studies and further work-up once mental status improves.  Best Practice (right click and "Reselect all SmartList Selections" daily)   Diet/type: NPO DVT prophylaxis: systemic heparin GI prophylaxis: PPI Lines: yes and it is still needed PICC line Foley:  Yes, and it is still needed Code Status:  full code Last date of multidisciplinary goals of care discussion [updated with daughters at bedside 2/21]  Brookelynne Dimperio M. Manvi Guilliams, D.O.  Internal Medicine Resident, PGY-1 [facility]  Pager: [phone] 8:44 AM, 09/11/2021

## 2021-09-11 NOTE — Progress Notes (Signed)
Lower extremity venous study attempted. Patient receiving RN care. Will attempt again as schedule and patient availability permits.   Darlin Coco, RDMS, RVT

## 2021-09-11 NOTE — Progress Notes (Addendum)
STROKE TEAM PROGRESS NOTE   INTERVAL HISTORY Her mother is at the bedside.  Patient febrile overnight. Low suspicion for meningitis and infection, antivirals and antibiotics discontinued this morning. Heparin initiated for venous sinus thrombus, 3% NS at 55ml/hr. Follow up CT in the morning. CT this morning shows slight increase in midline shift.  Vitals:   09/11/21 0500 09/11/21 0600 09/11/21 0630 09/11/21 0700  BP: 124/81 (!) 135/94 131/83 138/82  Pulse: 95 (!) 108 95 98  Resp: (!) 33 (!) 28 (!) 32 (!) 33  Temp: (!) 100.4 F (38 C) (!) 100.6 F (38.1 C) (!) 100.6 F (38.1 C) (!) 100.4 F (38 C)  TempSrc:      SpO2: 99% 100% 98% 100%  Weight: 62.7 kg     Height:       CBC:  Recent Labs  Lab 09/10/21 0847 09/11/21 0419  WBC 12.9* 8.4  NEUTROABS 11.2*  --   HGB 9.1* 8.0*  HCT 32.0* 26.6*  MCV 66.0* 63.8*  PLT 402* 643   Basic Metabolic Panel:  Recent Labs  Lab 09/10/21 0847 09/10/21 1045 09/10/21 2110 09/11/21 0419  NA 138   < > 140 146*  K 3.6  --   --  3.0*  CL 105  --   --  115*  CO2 18*  --   --  21*  GLUCOSE 139*  --   --  134*  BUN 8  --   --  6  CREATININE 0.79  --   --  0.75  CALCIUM 9.9  --   --  8.7*   < > = values in this interval not displayed.   Lipid Panel:  Recent Labs  Lab 09/11/21 0422  CHOL 139  TRIG 47  HDL 46  CHOLHDL 3.0  VLDL 9  LDLCALC 84   HgbA1c: No results for input(s): HGBA1C in the last 168 hours. Urine Drug Screen:  Recent Labs  Lab 09/10/21 1313  LABOPIA NONE DETECTED  COCAINSCRNUR NONE DETECTED  LABBENZ NONE DETECTED  AMPHETMU NONE DETECTED  THCU NONE DETECTED  LABBARB NONE DETECTED    Alcohol Level No results for input(s): ETH in the last 168 hours.  IMAGING past 24 hours CT ANGIO HEAD NECK W WO CM  Result Date: 09/10/2021 CLINICAL DATA:  Infarct with hemorrhagic conversion versus hemorrhagic infarct EXAM: CT ANGIOGRAPHY HEAD AND NECK TECHNIQUE: Multidetector CT imaging of the head and neck was performed using  the standard protocol during bolus administration of intravenous contrast. Multiplanar CT image reconstructions and MIPs were obtained to evaluate the vascular anatomy. Carotid stenosis measurements (when applicable) are obtained utilizing NASCET criteria, using the distal internal carotid diameter as the denominator. RADIATION DOSE REDUCTION: This exam was performed according to the departmental dose-optimization program which includes automated exposure control, adjustment of the mA and/or kV according to patient size and/or use of iterative reconstruction technique. CONTRAST:  1mL OMNIPAQUE IOHEXOL 350 MG/ML SOLN COMPARISON:  09/10/2021 FINDINGS: CT HEAD FINDINGS Brain: Redemonstrated area of hypodensity in the right frontal, parietal, occipital, and temporal lobes, including involvement of the right insula and right medial temporal lobe, with superimposed parenchymal hemorrhage in the right parietal, occipital, and temporal lobes. Hemorrhage is again noted in the occipital horn of the right lateral ventricle. Redemonstrated mass effect with approximately 11 mm of right-to-left midline shift, unchanged when measured similarly. Redemonstrated effacement of the basilar cisterns. Vascular: No hyperdense vessel. Skull: No acute fracture or focal lesion. Well corticated focal defects in the left greater  than right parietal calvarium (series 10, image 40), of indeterminate etiology, which were present on the prior exam. Sinuses: Minimal fluid in the left ethmoid air cells. Imaged portions are otherwise clear. Orbits: No acute finding. Review of the MIP images confirms the above findings CTA NECK FINDINGS Aortic arch: Standard branching. Imaged portion shows no evidence of aneurysm or dissection. No significant stenosis of the major arch vessel origins. Right carotid system: No evidence of dissection, stenosis (50% or greater) or occlusion. Left carotid system: No evidence of dissection, stenosis (50% or greater) or  occlusion. Vertebral arteries: No evidence of dissection, stenosis (50% or greater) or occlusion. Skeleton: Straightening of the normal cervical lordosis. No acute osseous abnormality. Other neck: Nasogastric tube.  Otherwise negative. Upper chest: Ground-glass opacities in the dependent right lung (series 10, image 330), and to a much lesser extent the dependent left lung, which may be infectious or inflammatory. No pleural effusion. Review of the MIP images confirms the above findings CTA HEAD FINDINGS Anterior circulation: Both internal carotid arteries are patent to the termini, with mild narrowing in the right petrous and cavernous segments, compared to the left, without focal calcification or stenosis. A1 segments patent, although there is multifocal irregularity of the right-greater-than-left A1, possibly representing vasospasm. Normal anterior communicating artery. Anterior cerebral arteries are patent to their distal aspects. No M1 stenosis or occlusion, although there is multifocal irregularity in the right M1 segment, likely representing vasospasm. This irregularity extends into the proximal right M2 segments, with less irregularity in the distal right MCA branches. Left M1 and MCA branches are perfused without focal stenosis. Posterior circulation: Vertebral arteries patent to the vertebrobasilar junction without stenosis. Posterior inferior cerebral arteries patent bilaterally. Basilar patent to its distal aspect. Superior cerebellar arteries patent bilaterally. Patent P1 segments. PCAs perfused to their distal aspects without stenosis. The left posterior communicating artery is somewhat irregular but patent. The left posterior communicating artery is not definitively visualized. Venous sinuses: Please see same-day CT venogram. Anatomic variants: None significant Review of the MIP images confirms the above findings IMPRESSION: 1. Multifocal narrowing in the right-greater-than-left A1, right M1, and  proximal right M2 segments, likely reflecting vasospasm. Narrowing in the right petrous and cavernous ICA may also be related to known intracranial hemorrhage. 2. No intracranial large vessel occlusion or significant stenosis. 3.  No hemodynamically significant stenosis in the neck. 4. Redemonstrated hyperdensity and intraparenchymal hemorrhage in the right cerebral hemisphere, concerning for infarct with hemorrhagic conversion, with 11 mm of right-to-left midline shift, unchanged when remeasured similarly, and effacement of the basal cisterns. 5. Redemonstrated hemorrhage within the right lateral ventricle, without evidence of hydrocephalus. 6. Please see same day CT venogram for findings involving the venous sinuses. Electronically Signed   By: Merilyn Baba M.D.   On: 09/10/2021 22:20   CT HEAD WO CONTRAST (5MM)  Result Date: 09/11/2021 CLINICAL DATA:  Neuro deficit with acute stroke suspected EXAM: CT HEAD WITHOUT CONTRAST TECHNIQUE: Contiguous axial images were obtained from the base of the skull through the vertex without intravenous contrast. RADIATION DOSE REDUCTION: This exam was performed according to the departmental dose-optimization program which includes automated exposure control, adjustment of the mA and/or kV according to patient size and/or use of iterative reconstruction technique. COMPARISON:  Head CT from yesterday FINDINGS: Brain: Cytotoxic edema and patchy parenchymal hemorrhage in the posterior right cerebrum affecting the temporal, occipital, and parietal lobes. Diffuse effacement of sulci and leftward midline shift measuring 11 mm. Hemorrhage within the right posterior occipital horn  of the lateral ventricle, which is otherwise compressed. Vascular: See prior CTA and CT.  No interval finding. Skull: Normal. Negative for fracture or focal lesion. Sinuses/Orbits: No acute finding. IMPRESSION: Unchanged extent of venous infarction with hemorrhage in the right cerebral hemisphere. Mildly  progressive swelling causes further midline shift which is increased from 9 to 11 mm. Electronically Signed   By: Jorje Guild M.D.   On: 09/11/2021 07:09   CT Head Wo Contrast  Addendum Date: 09/10/2021   ADDENDUM REPORT: 09/10/2021 09:58 ADDENDUM: These results were called by telephone at the time of interpretation on 09/10/2021 at 9:54 am to provider [facility] , who verbally acknowledged these results. Electronically Signed   By: Kellie Simmering D.O.   On: 09/10/2021 09:58   Result Date: 09/10/2021 CLINICAL DATA:  Provided history: Mental status change, unknown cause. EXAM: CT HEAD WITHOUT CONTRAST TECHNIQUE: Contiguous axial images were obtained from the base of the skull through the vertex without intravenous contrast. RADIATION DOSE REDUCTION: This exam was performed according to the departmental dose-optimization program which includes automated exposure control, adjustment of the mA and/or kV according to patient size and/or use of iterative reconstruction technique. COMPARISON:  None. FINDINGS: Brain: Large region of cortical/subcortical edema within the right frontal, parietal, occipital and temporal lobes, as well as right insula and subinsular region. There are superimposed patchy foci of acute parenchymal hemorrhage within the right temporal, occipital and parietal lobes. Moderate-volume extension of hemorrhage into the right lateral ventricle is questioned (see annotations on series 3, image 11). Associated mass effect with significant partial effacement of the right lateral ventricle. 9 mm leftward midline shift measured at the level of the septum pellucidum. The basal cisterns are effaced. No definite evidence of ventricular entrapment at this time. No extra-axial fluid collection. Vascular: No hyperdense vessel. Skull: Normal. Negative for fracture or focal lesion. Sinuses/Orbits: Visualized orbits show no acute finding. Small volume fluid layering within a posterior left ethmoid air  cell. Attempts are being made to reach the ordering provider at this time. IMPRESSION: Large region of cortical/subcortical edema within the right frontal, parietal, occipital and temporal lobes, as well as right insula and subinsular region. Superimposed patchy foci of acute parenchymal hemorrhage within the right parietal, occipital and temporal lobes. These findings are favored to reflect an acute/early subacute infarct from arterial ischemia (with hemorrhagic conversion) or a hemorrhagic infarct due to intracranial venous thrombosis. An MRI of the brain with contrast (including thin-slice [id number] post-contrast imaging for assessment of the intracranial venous structures) is recommended for further evaluation. A CTA of the head/neck should also be considered. Associated mass effect with significant partial effacement of the right lateral ventricle and 9 mm leftward midline shift. The basal cisterns are effaced. Neurosurgical consultation is advised. Moderate-volume extension of hemorrhage into the right lateral ventricle is questioned. No definite evidence of hydrocephalus or ventricular entrapment at this time. Electronically Signed: By: Kellie Simmering D.O. On: 09/10/2021 09:48   MR BRAIN W WO CONTRAST  Result Date: 09/10/2021 CLINICAL DATA:  Stroke EXAM: MRI HEAD WITHOUT AND WITH CONTRAST TECHNIQUE: Multiplanar, multiecho pulse sequences of the brain and surrounding structures were obtained without and with intravenous contrast. CONTRAST:  38mL GADAVIST GADOBUTROL 1 MMOL/ML IV SOLN COMPARISON:  No prior MRI, correlation is made with CT head and CTA head and CT venogram 09/10/2021 FINDINGS: Brain: Restricted diffusion in the right temporal lobe, occipital lobe, parietal lobe, and posterior frontal lobe, predominantly in the cortex (series 5, images 66-85). This area also  demonstrates a large amount of increased T2 signal, consistent with edema, as well as T1 isointense and T2 hypointense signal consistent  with acute hemorrhage in the right temporal and occipital lobes (series 11, image 11). This causes significant mass effect and 11 mm of right-to-left midline shift, with medial displacement of the right uncus and temporal horn of the lateral ventricle. Additional area of restricted diffusion in the medial right aspect of the basal ganglia (series 5, image 75 and series 7, image 56), possibly in the preoptic area of the hypothalamus, likely reflecting sequela of the aforementioned mass effect. Effacement of the basal cisterns. Hemorrhage is noted layering in the occipital horns of the lateral ventricles. Effacement of the right lateral ventricle and third ventricle. No evidence of hydrocephalus. Small amount of subdural hemorrhage along the right frontal lobe (series 10, image 17), measuring up to 4 mm. Leptomeningeal enhancement in the previously noted affected areas of the right cerebral hemisphere. Vascular: Filling defect in the right distal transverse and proximal and mid sigmoid sinus, consistent with thrombus, as seen on the CTV. Arterial flow voids appear normal, although there is leftward displacement of the anterior cerebral arteries and right PCA. Skull and upper cervical spine: Normal marrow signal. Sinuses/Orbits: Negative. Other: None. IMPRESSION: 1. Large area of cortical infarction in the right temporal, occipital, parietal, and posterior frontal lobes, with significant associated edema and redemonstrated intraparenchymal hemorrhage. The mass effect causes 11 mm of right-to-left midline shift, medial displacement of the right uncus and right temporal horn and effacement of the basal cisterns, unchanged from the prior CT and raising concern for right uncal herniation. 2. Additional focal area of restricted diffusion in the medial aspect of the right basal ganglia, suspected to be in the preoptic area of the hypothalamus, likely sequela of aforementioned mass effect. 3. Hemorrhage layering in the  occipital horns of the lateral ventricles, with redemonstrated effacement of the right lateral and third ventricle. No evidence of hydrocephalus. 4. Small amount of subdural hemorrhage along the right frontal lobe, measuring up to 4 mm. 5. Filling defect in the distal right transverse sinus and proximal and mid right sigmoid sinus, consistent with thrombus, as seen on the same-day CTV. These results were called by telephone at the time of interpretation on 09/10/2021 at 11:58 pm to provider DR. ARORA, who verbally acknowledged these results. Electronically Signed   By: Merilyn Baba M.D.   On: 09/10/2021 23:59   DG Chest Port 1 View  Result Date: 09/11/2021 CLINICAL DATA:  Pneumonia EXAM: PORTABLE CHEST 1 VIEW COMPARISON:  09/10/2021 FINDINGS: Enteric tube courses below the diaphragm with distal tip beyond the inferior margin of the film. Interval placement of right-sided PICC line with distal tip terminating at the level of the superior cavoatrial junction. Normal heart size. Interval development of hazy right lower lobe airspace opacity. Suspected small right pleural effusion. Left lung is clear. No pneumothorax. Dextroscoliotic thoracic curvature. IMPRESSION: Interval development of hazy right lower lobe airspace opacity with probable small right pleural effusion. Findings concerning for pneumonia. Electronically Signed   By: Davina Poke D.O.   On: 09/11/2021 08:12   DG Chest Port 1 View  Result Date: 09/10/2021 CLINICAL DATA:  Questionable sepsis - evaluate for abnormality EXAM: PORTABLE CHEST 1 VIEW COMPARISON:  None. FINDINGS: Rotated. No consolidation or edema. No pleural effusion. Cardiomediastinal contours are likely within normal limits for technique. Thoracic spine dextroscoliosis. IMPRESSION: No acute process in the chest. Electronically Signed   By: Addison Lank.D.  On: 09/10/2021 08:55   DG Abd Portable 1V  Result Date: 09/10/2021 CLINICAL DATA:  Feeding tube inserted.  Check  placement. EXAM: PORTABLE ABDOMEN - 1 VIEW COMPARISON:  None. FINDINGS: Weighted tip enteric tube descends below the diaphragm and curls along the gastric fundus and greater curvature of the stomach with the tip overlying the distal aspect of the stomach. Moderate dextrocurvature of the mid to lower thoracic spine Nonobstructed bowel-gas pattern. Scattered subsegmental atelectasis within the visualized lungs. IMPRESSION: Enteric tube tip overlies the distal stomach. Electronically Signed   By: Yvonne Kendall M.D.   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 EEG to evaluate for seizure Level of alertness:  lethargic AEDs during EEG study: None Technical aspects: This EEG study was done with scalp electrodes positioned according to the 10-20 International system of electrode placement. Electrical activity was acquired at a sampling rate of 500Hz  and reviewed with a high frequency filter of 70Hz  and a low frequency filter of 1Hz . EEG data were recorded continuously and digitally stored. Description: No clear posterior dominant rhythm was seen. EEG showed continuous generalized and lateralized right hemisphere polymorphic disorganized 6-9hz  theta-alpha activity. Lateralized periodic discharges were noted in right hemisphere, maximal right parieto-occipital region with fluctuating frequency of 0.5 to 1.5hz  at times with overriding rhythmicity ( LPD+R) Hyperventilation and photic stimulation were not performed.   ABNORMALITY - Lateralized periodic discharges with overriding rhythmicity (  LPD +R),  right hemisphere, maximal right parieto-occipital region - Continuous slow, generalized and lateralized right hemisphere IMPRESSION: This study showed evidence of epileptogenicity and cortical dysfunction arising from right hemisphere, maximal right parieto-occipital region. Likely due to underlying hemorrhage. This EEG pattern is on the ictal-interictal continuum with higher potential for seizure recurrence. Additionally there is moderate diffuse encephalopathy, nonspecific etiology.  No definite seizures were seen throughout the recording. Lora Havens   ECHOCARDIOGRAM COMPLETE  Result Date: 09/10/2021    ECHOCARDIOGRAM REPORT   Patient Name:   VALISSA LYVERS Date of Exam: 09/10/2021 Medical Rec #:  [id number]     Height:       59.0 in Accession #:    [id number]    Weight:       136.7 lb Date of Birth:  Mar 08, 1971     BSA:          1.569 m Patient Age:    75 years      BP:           137/53 mmHg Patient Gender: F             HR:           97 bpm. Exam Location:  Inpatient Procedure: 2D Echo, Color Doppler and Cardiac Doppler Indications:    Stroke i63.9  History:        Patient has no prior history of Echocardiogram examinations.  Sonographer:    Raquel Sarna Senior RDCS Referring Phys: [phone] ERIC LINDZEN  Sonographer Comments: Windows limited by implants IMPRESSIONS  1. Left ventricular ejection fraction, by estimation, is 60 to 65%. The left ventricle has normal function. The left ventricle has no regional wall motion abnormalities.  Left ventricular diastolic parameters were normal.  2. Right ventricular systolic function is normal. The right ventricular size is normal.  3. The mitral valve is normal in structure. Trivial mitral valve regurgitation.  4. The aortic valve is normal in structure. Aortic valve regurgitation is not visualized. No aortic stenosis is present. FINDINGS  Left Ventricle: Left ventricular ejection fraction, by estimation, is 60 to 65%. The left ventricle has normal function. The left  ventricle has no regional wall motion abnormalities. The left ventricular internal cavity size was normal in size. There is  no left ventricular hypertrophy. Left ventricular diastolic parameters were normal. Right Ventricle: The right ventricular size is normal. Right vetricular wall thickness was not well visualized. Right ventricular systolic function is normal. Left Atrium: Left atrial size was normal in size. Right Atrium: Right atrial size was normal in size. Pericardium: There is no evidence of pericardial effusion. Mitral Valve: The mitral valve is normal in structure. Trivial mitral valve regurgitation. Tricuspid Valve: The tricuspid valve is normal in structure. Tricuspid valve regurgitation is trivial. Aortic Valve: The aortic valve is normal in structure. Aortic valve regurgitation is not visualized. No aortic stenosis is present. Pulmonic Valve: The pulmonic valve was normal in structure. Pulmonic valve regurgitation is not visualized. Aorta: The aortic root and ascending aorta are structurally normal, with no evidence of dilitation. IAS/Shunts: The atrial septum is grossly normal.  LEFT VENTRICLE PLAX 2D LVIDd:         3.90 cm LVIDs:         2.30 cm LV PW:         0.70 cm LV IVS:        0.50 cm LVOT diam:     1.70 cm LV SV:         66 LV SV Index:   42 LVOT Area:     2.27 cm  RIGHT VENTRICLE RV S prime:     14.00 cm/s TAPSE (M-mode): 2.9 cm LEFT ATRIUM             Index        RIGHT ATRIUM           Index LA diam:        3.10 cm 1.98 cm/m   RA Area:     11.40 cm LA Vol (A2C):   32.8 ml 20.91 ml/m  RA Volume:   24.10 ml  15.36 ml/m LA Vol (A4C):   38.6 ml 24.60 ml/m LA Biplane Vol: 38.7 ml 24.67 ml/m  AORTIC VALVE LVOT Vmax:   161.00 cm/s LVOT Vmean:  91.600 cm/s LVOT VTI:    0.289 m  AORTA Ao Root diam: 2.60 cm MITRAL VALVE MV Area (PHT): 2.80 cm    SHUNTS MV Decel Time: 271 msec    Systemic VTI:  0.29 m MV E velocity: 90.00 cm/s  Systemic Diam: 1.70 cm MV A velocity: 38.10 cm/s MV E/A ratio:   2.36 Mertie Moores MD Electronically signed by Mertie Moores MD Signature Date/Time: 09/10/2021/4:02:58 PM    Final    CT VENOGRAM HEAD  Result Date: 09/10/2021 CLINICAL DATA:  Stroke, hemorrhagic EXAM: CT VENOGRAM HEAD TECHNIQUE: Venographic phase images of the brain were obtained following the administration of intravenous contrast. Multiplanar reformats and maximum intensity projections were generated. RADIATION DOSE REDUCTION: This exam was performed according to the departmental dose-optimization program which includes automated exposure control, adjustment of the mA and/or kV according to patient size and/or use of iterative reconstruction technique. CONTRAST:  71mL OMNIPAQUE  IOHEXOL 350 MG/ML SOLN COMPARISON:  No prior CTV, correlation is made with same day CTA. FINDINGS: Superior sagittal sinus: Normal. Straight sinus: Normal. Inferior sagittal sinus, vein of Galen and internal cerebral veins: Normal. Transverse sinuses: Filling defect in the distal right transverse sinus (series 4, image 108). Normal left transverse sinus. Sigmoid sinuses: Filling defect in the proximal and mid right sigmoid sinus (series 4, images 109-130), with reconstitution just proximal to the jugular bulb. The left sigmoid sinuses normal. 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 These results will be called to the ordering clinician or representative by the Radiologist Assistant, and communication documented in the PACS or [employer]. Electronically Signed   By: Merilyn Baba M.D.   On: 09/10/2021 22:24   [place] EKG SITE RITE  Result Date: 09/10/2021 [facility] image not attached, placement could not be confirmed due to current cardiac rhythm.   PHYSICAL EXAM  Physical Exam  Constitutional: Appears well-developed and well-nourished.  Psych: Affect appropriate to situation Cardiovascular: Normal rate and regular rhythm.   Respiratory: Effort normal, non-labored breathing  Neuro: Mental Status: Patient is unresponsive to verbal stimuli, she moans with stimulation. Eyes do not open to command or stimulation.  Cranial Nerves: Right eye dysconjugate to the right , right pupil 40mm, left pupil 45mm, Does not blink to threat, Facial movement appears symmetric, cough and gag intact, Head is midline, Tongue does not protrude Motor: Tone is normal. Bulk is normal. Moves all extremities antigravity, Moves right upper and lower extremities more than left extremities.  Sensory: Withdraws to pain in all extremities Cerebellar: Unable to participate   ASSESSMENT/PLAN Ms. Jamyria [place] is a 51 y.o. female with history of sickle cell,  presenting with altered mental status, decreased LOC, flu like symptoms and possible unwitnessed fall out of bed. CT shows  large region of cortical subcortical edema within the right frontal parietal occipital and temporal lobes as well as right insula and subinsular region with patchy foci of acute parenchymal hemorrhage in the same areas. Concern for an acute, early subacute ischemia versus thrombosis of the venous system. 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 Heparin stroke protocol initiated. Hypertonic saline for cerebral edema. CT scan 2/21 showed progressive increase in midline shift from 55mm to 28mm, unchanged hemorrhage in right cerebral hemisphere. Repeat CT scan ordered for 2/22. Neurosurgery recommends medical management with hypertonic saline as opposed to crani due to heparin infusion.  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 Midline shift  increased from 9 to 11 mm. CTA head & neck- Multifocal narrowing in the right-greater-than-left A1, right M1, and proximal right M2 segments, likely reflecting vasospasm. Narrowing in the right petrous and cavernous ICA may also be related to known intracranial hemorrhage. Hyperdensity and IPH in the right cerebral hemisphere, concerning for infarct with hemorrhagic conversion, with 11 mm of right-to-left midline shift, and effacement of the basal cisterns. 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 MRI- Large area of cortical infarction in the right temporal, occipital, parietal, and posterior frontal lobes, with significant associated edema and redemonstrated intraparenchymal hemorrhage. The mass effect causes 11 mm of  right-to-left midline shift, medial displacement of the right uncus and right temporal horn and effacement of the basal cisterns [place] Lower extremity duplex- pending 2D Echo EF 60-65%, normal LV function LDL 84 HgbA1c pending VTE prophylaxis - IV Heparin No antithrombotic prior to admission, now on heparin IV.  Therapy recommendations:  Pending Disposition:  Pending  Cerebral edema- concern for uncal herniation Repeat CT scan midline shift increase from 21mm -> 31mm Hypertonic saline 43ml/hr -> NS Na goal 150-155 Na- 146-> 153->157  Concern for seizure activity EEG- evidence of epileptogenicity and cortical dysfunction, no definite seizures seen throughout the recording, high potential for seizure recurrence. LTM EEG 2/21 evidence of epileptogenicity and cortical dysfunction arising from right hemisphere, maximal right parieto-occipital region likely due to underlying hemorrhage Phenytoin 100mg  q8hr followed by load Dilantin level 18.3-> penidng  Hypertension Stable BP goal 130-150 PRN labetolol 10mg  q10 min  Hyperlipidemia Home meds:  None LDL 84, goal < 70 Initiate statin therapy when appropriate  Diabetes type  II Controlled Home meds:  None HgbA1c pending, goal < 7.0 CBGs  SSI  Dysphagia Cortrak placed Tube feeding initiated- Osmolite 1.5 @ 36ml/hr  Febrile Trend WBC- 12.9-> 8.4 Central fever?  PRN tylenol given Off rocephin, azithromycine and acyclovir  Viral pneumonia- CCM managing Procal 0.11 Repeat CXR- interval development of right lower lobe airspace opacity. Family describes 3 days of URI symptoms. 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 MD to update note as needed.   Janine Ores, DNP, FNP-BC [facility] Pager: [phone]  ATTENDING NOTE: I reviewed above note and agree with the assessment and plan. Pt was seen and examined.   51 year old female with history of sickle cell trait, large fibroid, anemia admitted after lethargy and altered mental status for 3 days, fever and left-sided weakness for 1 day, fell out of bed.  CT showed right large hypodensity with hemorrhagic transformation.  Repeat CT showed midline shift stable.  CT head and neck showed anterior circulation mild vasospasm, CTV showed possible right transverse and sigmoid sinus nonocclusive thrombosis.  MRI consistent with large infarct with hemorrhagic transformation.  CT repeat this morning shows stable midline shift.  EEG concerning for seizure, loaded with fosphenytoin followed by Dilantin.  On long-term EEG.  EF 60 to 65%, UDS negative.  LDL 84 and A1c pending.  Creatinine 0.75.  Blood culture pending, continue to have fever with improved leukocytosis.  On exam, patient lethargic, barely open eyes on voice, not answer questions, nonverbal, however moaning with pain stimulation.  With forced eye opening, left eye in mid position, right eye lateral gaze position, disconjugate, pupil left to millimeter, right 3 mm, both reactive to light.  Not blinking  to visual threat.  No significant facial droop, bilateral upper and lower extremity withdraw to pain more on right.  Etiology for patient's symptoms not typical for arterial infarct given insidious presentation, and predominant symptoms of fever and altered mental status.  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  CTV concerning for venous thrombosis, started on heparin IV.  Severe cerebral edema with midline shift, on 3% saline, sodium target 1 50-1 55.  Follow-up urine culture and long-term EEG.  Dilantin level 18.3, at goal.  Repeat CT in a.m. appreciate CCM assistance.  For detailed assessment and plan, please refer to above as I have made changes  wherever appropriate.   Rosalin Hawking, MD PhD Stroke Neurology 09/11/2021 10:37 PM  This patient is critically ill due to large renal infarct with hemorrhagic transformation, significant cerebral edema with midline shift, fever, persistent altered mental status, seizure and at significant risk of neurological worsening, death form brain herniation, brain death, hydrocephalus, stroke, status epilepticus, hematoma expansion. This patient's care requires constant monitoring of vital signs, hemodynamics, respiratory and cardiac monitoring, review of multiple databases, neurological assessment, discussion with family, other specialists and medical decision making of high complexity. I spent 50 minutes of neurocritical care time in the care of this patient. I had long discussion with mom and brother at bedside, updated pt current condition, treatment plan and potential prognosis, and answered all the questions.  Very expressed understanding and appreciation.      To contact Stroke Continuity provider, please refer to [email or web address] After hours, contact General Neurology

## 2021-09-11 NOTE — Progress Notes (Signed)
[place] Progress Note Patient Name: April Reilly DOB: Dec 24, 1970 MRN: [id number]   Date of Service  09/11/2021  HPI/Events of Note  Hypernatremia - Na+ = 160 --> 161. Now off 3% NaCl since 8 PM and on 0.9 NaCl at 50 mL/hour.   eICU Interventions  Plan: Decrease 0.9 NaCl IV fluid to 20 mL/hour.  Continue to trend Na+.     Intervention Category Major Interventions: Electrolyte abnormality - evaluation and management  April Reilly 09/11/2021, 10:41 PM

## 2021-09-11 NOTE — Progress Notes (Signed)
Date and time results received: 09/11/21 0749 (use smartphrase ".now" to insert current time)  Test: chloride Critical Value: >130  Name of Provider Notified: elink  Orders Received? Or Actions Taken?: See New orders

## 2021-09-11 NOTE — Progress Notes (Signed)
Pt BP 121/80. SBP goal is 130-150. Dr. Rory Percy notified. No new orders at this time.

## 2021-09-11 NOTE — Progress Notes (Signed)
Date and time results received: 09/11/21 2230 (use smartphrase ".now" to insert current time)  Test: Sodium Critical Value: 161  Name of Provider Notified: Elink  Orders Received? Or Actions Taken?: No new orders at this time

## 2021-09-11 NOTE — Procedures (Addendum)
Patient Name: April Reilly  MRN: [id number]  Epilepsy Attending: Lora Havens  Referring Physician/Provider: Lora Havens, MD Duration: 09/10/2021 1802 to 09/11/2021 1802   Patient history: 51 year old female presenting with large right frontoparietal ischemic infarction with hemorrhagic conversion, mass effect with right to left midline shift and initial stages of midbrain compression. EEG to evaluate for seizure   Level of alertness:  lethargic    AEDs during EEG study: Ativan, PHT   Technical aspects: This EEG study was done with scalp electrodes positioned according to the 10-20 International system of electrode placement. Electrical activity was acquired at a sampling rate of 500Hz  and reviewed with a high frequency filter of 70Hz  and a low frequency filter of 1Hz . EEG data were recorded continuously and digitally stored.    Description: No clear posterior dominant rhythm was seen. EEG showed continuous generalized and lateralized right hemisphere polymorphic disorganized mixed frequencies with predominantly 6-9hz  theta-alpha activity admixed with 13 to 15 Hz beta activity.  Abundant sharp waves were noted in right hemisphere, maximal right parieto-occipital region which appeared quasiperiodic at 0.5 to 2 hz. Hyperventilation and photic stimulation were not performed.      ABNORMALITY - Sharp waves,  right hemisphere, maximal right parieto-occipital region  - Continuous slow, generalized and lateralized right hemisphere    IMPRESSION: This study showed evidence of epileptogenicity and cortical dysfunction arising from right hemisphere, maximal right parieto-occipital region likely due to underlying hemorrhage. Additionally there is moderate diffuse encephalopathy, nonspecific etiology.  No definite seizures were seen throughout the recording.   [name] Barbra Sarks

## 2021-09-11 NOTE — Progress Notes (Signed)
Initial Nutrition Assessment  DOCUMENTATION CODES:   Not applicable  INTERVENTION:   Initiate tube feeding via Cortrak tube: Osmolite 1.5 at 40 ml/h (960 ml per day) Prosource TF 45 ml TID  Provides 1560 kcal, 93 gm protein, 729 ml free water daily   NUTRITION DIAGNOSIS:   Inadequate oral intake related to inability to eat as evidenced by NPO status.  GOAL:   Patient will meet greater than or equal to 90% of their needs  MONITOR:   TF tolerance  REASON FOR ASSESSMENT:   Consult Enteral/tube feeding initiation and management  ASSESSMENT:   Pt with PMH of sickle cell trait, heavy vaginal bleeding, anemia admitted after AMS for 3 days with large R frontoparietal infarct with hemorrhagic conversion and cerebral edema.    Pt discussed during ICU rounds and with RN. Per MD ok to start TF. No family present. No plans for neurosurgery at this time. Pt is started on 3%.   2/20 cortrak placed; tip gastric  2/21 initiate TF   Medications reviewed and include: protonix, KCl  Hypertonic saline   Labs reviewed: Na 146, K: 3   NUTRITION - FOCUSED PHYSICAL EXAM:  Flowsheet Row Most Recent Value  Orbital Region No depletion  Upper Arm Region No depletion  Thoracic and Lumbar Region No depletion  Buccal Region No depletion  Temple Region No depletion  Clavicle Bone Region No depletion  Clavicle and Acromion Bone Region No depletion  Scapular Bone Region No depletion  Dorsal Hand No depletion  Patellar Region No depletion  Anterior Thigh Region No depletion  Posterior Calf Region No depletion  Edema (RD Assessment) None  Hair Reviewed  Eyes Reviewed  Mouth Reviewed  Skin Reviewed  Nails Reviewed       Diet Order:   Diet Order             Diet NPO time specified  Diet effective now                   EDUCATION NEEDS:   Not appropriate for education at this time  Skin:  Skin Assessment: Reviewed RN Assessment  Last BM:  unknown  Height:   Ht  Readings from Last 1 Encounters:  09/10/21 4\' 11"  (1.499 m)    Weight:   Wt Readings from Last 1 Encounters:  09/11/21 62.7 kg    BMI:  Body mass index is 27.92 kg/m.  Estimated Nutritional Needs:   Kcal:  1550-1800  Protein:  80-95 grams  Fluid:  > 1.5 L/day  Lockie Pares., RD, LDN, CNSC See AMi[facility] for contact information

## 2021-09-11 NOTE — TOC CAGE-AID Note (Signed)
Transition of Care [facility]) - CAGE-AID Screening   Patient Details  Name: April Reilly MRN: [id number] Date of Birth: Nov 21, 1970  [facility]) CM/SW Contact:    Diyari Cherne C Tarpley-Carter, [place] Phone Number: 09/11/2021, 1:40 PM   Clinical Narrative: Pt is unable to participate in Cage Aid. CSW will assess at a better time.  Arlissa Monteverde Tarpley-Carter, MSW, LCSW-A Pronouns:  She/Her/Hers [place] Transitions of Care Clinical Social Worker Direct Number:  [phone] [email or web address]  CAGE-AID Screening: Substance Abuse Screening unable to be completed due to: : Patient unable to participate             Substance Abuse Education Offered: Yes

## 2021-09-11 NOTE — Progress Notes (Signed)
[facility] Progress Note Patient Name: April Reilly DOB: [birth year] MRN: [id number]   Date of Service  09/11/2021  HPI/Events of Note  Hyperglycemia - Blood glucose = 163. Patient on tube feeds.   eICU Interventions  Plan: Q  4 hour sensitive Novolog SSI.     Intervention Category Major Interventions: Hyperglycemia - active titration of insulin therapy  Lysle Dingwall 09/11/2021, 7:57 PM

## 2021-09-11 NOTE — Progress Notes (Signed)
[place] for heparin Indication:  cerebral venous sinus thrombosis in setting of stroke with hemorrhagic conversion  No Known Allergies  Patient Measurements: Height: 4\' 11"  (149.9 cm) Weight: 62.7 kg (138 lb 3.7 oz) IBW/kg (Calculated) : 43.2 Heparin Dosing Weight: 55kg  Vital Signs: Temp: 100.4 F (38 C) (02/21 0700) Temp Source: Bladder (02/20 2300) BP: 138/82 (02/21 0700) Pulse Rate: 98 (02/21 0700)  Labs: Recent Labs    09/10/21 0847 09/10/21 1437 09/10/21 1627 09/11/21 0419 09/11/21 0838  HGB 9.1*  --   --  8.0*  --   HCT 32.0*  --   --  26.6*  --   PLT 402*  --   --  325  --   APTT  --  30  --   --   --   LABPROT  --  15.5*  --   --   --   INR  --  1.2  --   --   --   HEPARINUNFRC  --   --   --   --  <0.10*  CREATININE 0.79  --   --  0.75  --   CKTOTAL  --   --  230  --   --   CKMB  --   --  1.6  --   --      Estimated Creatinine Clearance: 67.7 mL/min (by C-G formula based on SCr of 0.75 mg/dL).   Medical History: Past Medical History:  Diagnosis Date   Asthma     Medications:  Medications Prior to Admission  Medication Sig Dispense Refill Last Dose   ibuprofen (ADVIL) 200 MG tablet Take 200 mg by mouth every 6 (six) hours as needed for mild pain.   09/09/2021   megestrol (MEGACE) 40 MG tablet Take 1 tablet (40 mg total) by mouth 2 (two) times daily. 60 tablet 3 09/09/2021   oxyCODONE-acetaminophen (PERCOCET/ROXICET) 5-325 MG tablet Take 1-2 tablets by mouth every 8 (eight) hours as needed for severe pain. (Patient not taking: Reported on 09/10/2021) 6 tablet 0 Not Taking   Scheduled:   chlorhexidine  15 mL Mouth Rinse BID   Chlorhexidine Gluconate Cloth  6 each Topical Q0600   feeding supplement (PROSource TF)  45 mL Per Tube TID   mouth rinse  15 mL Mouth Rinse q12n4p   pantoprazole (PROTONIX) IV  40 mg Intravenous Q24H   phenytoin (DILANTIN) IV  100 mg Intravenous Q8H   potassium chloride  40 mEq Per Tube  Q6H   sodium chloride flush  10-40 mL Intracatheter Q12H   Infusions:   feeding supplement (OSMOLITE 1.5 CAL)     heparin 600 Units/hr (09/11/21 0700)   sodium chloride (hypertonic) 50 mL/hr at 09/11/21 8144    Assessment: 51yo female presents to ED w/ AMS and decreased LOC,  >> CT reveals large right hemisphere stroke with mass effect and hemorrhagic conversion >> now CT venogram shows cerebral venous sinus thromosis >> to begin heparin.  D/w neuro Dr Rory Percy to confirm anticoagulation in this pt with concurrent bleed; will dose and adjust cautiously with close monitoring.  Initial heparin level undetectable on 600 units/hr, repeat CT head this AM unchanged hemorrhage  Goal of Therapy:  Heparin level 0.3-0.5 units/ml Monitor platelets by anticoagulation protocol: Yes   Plan:  Increase heparin gtt to 700 units/hr, no bolus F/u 6 hour heparin level  Bertis Ruddy, PharmD Clinical Pharmacist ED Pharmacist Phone # [phone] 09/11/2021 9:45 AM

## 2021-09-11 NOTE — Progress Notes (Signed)
PT Cancellation Note  Patient Details Name: April Reilly MRN: [id number] DOB: July 09, 1971   Cancelled Treatment:    Reason Eval/Treat Not Completed: Patient not medically ready. Will check back.   Leighton Roach, [place]  Pager [phone] Office [place] 09/11/2021, 12:12 PM

## 2021-09-11 NOTE — Progress Notes (Addendum)
MRI brain reviewed with neuroradiology.  Concern for uncal herniation. Reached out to neurosurgery-Dr. Annette Stable who also reviewed the scan.   For now, given the fact that she has a venous sinus thrombus and is on anticoagulation, surgery would be extremely risky because she cannot be on anticoagulation post crani. She is likely getting to a point where she is going to peak swelling already and hopefully the swelling would start to come down with hyperosmolar therapy. We will continue to watch her closely clinically and with imaging-next-CT at 6 AM. Appreciate neurosurgical evaluation.  -- Amie [place], MD Neurologist [facility] Pager: [phone]

## 2021-09-11 NOTE — Progress Notes (Signed)
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  No Known Allergies  Patient Measurements: Height: 4\' 11"  (149.9 cm) Weight: 62.7 kg (138 lb 3.7 oz) IBW/kg (Calculated) : 43.2 Heparin Dosing Weight: 55kg  Vital Signs: Temp: 100.2 F (37.9 C) (02/21 1500) BP: 140/95 (02/21 1700) Pulse Rate: 126 (02/21 1700)  Labs: Recent Labs    09/10/21 0847 09/10/21 1437 09/10/21 1627 09/11/21 0419 09/11/21 0838 09/11/21 1633  HGB 9.1*  --   --  8.0*  --   --   HCT 32.0*  --   --  26.6*  --   --   PLT 402*  --   --  325  --   --   APTT  --  30  --   --   --   --   LABPROT  --  15.5*  --   --   --   --   INR  --  1.2  --   --   --   --   HEPARINUNFRC  --   --   --   --  <0.10* <0.10*  CREATININE 0.79  --   --  0.75  --   --   CKTOTAL  --   --  230  --   --   --   CKMB  --   --  1.6  --   --   --      Estimated Creatinine Clearance: 67.7 mL/min (by C-G formula based on SCr of 0.75 mg/dL).   Assessment: 51yo female presents to ED w/ AMS and decreased LOC and CT reveals large right hemisphere stroke with mass effect and hemorrhagic conversion.  CT venogram shows cerebral venous sinus thromosis and patient was started on IV heparin.  D/w neuro Dr. Rory Percy to confirm anticoagulation in this pt with concurrent bleed; will dose and adjust cautiously with close monitoring.  Heparin level remains undetectable on 700 units/hr; repeat CT head this AM showed unchanged hemorrhage.  No issue with heparin infusion per RN.  Goal of Therapy:  Heparin level 0.3-0.5 units/ml Monitor platelets by anticoagulation protocol: Yes   Plan:  Increase heparin gtt to 850 units/hr, no bolus Check 6 hour heparin level  Hanan Mcwilliams D. Mina Marble, PharmD, BCPS, [place] 09/11/2021, 6:20 PM

## 2021-09-12 ENCOUNTER — Inpatient Hospital Stay (HOSPITAL_COMMUNITY): Payer: 59

## 2021-09-12 DIAGNOSIS — G9341 Metabolic encephalopathy: Secondary | ICD-10-CM | POA: Diagnosis not present

## 2021-09-12 DIAGNOSIS — I639 Cerebral infarction, unspecified: Secondary | ICD-10-CM | POA: Diagnosis not present

## 2021-09-12 DIAGNOSIS — R509 Fever, unspecified: Secondary | ICD-10-CM | POA: Diagnosis not present

## 2021-09-12 DIAGNOSIS — D72829 Elevated white blood cell count, unspecified: Secondary | ICD-10-CM | POA: Diagnosis not present

## 2021-09-12 DIAGNOSIS — R401 Stupor: Secondary | ICD-10-CM | POA: Diagnosis not present

## 2021-09-12 DIAGNOSIS — R4182 Altered mental status, unspecified: Secondary | ICD-10-CM | POA: Diagnosis not present

## 2021-09-12 DIAGNOSIS — R569 Unspecified convulsions: Secondary | ICD-10-CM | POA: Diagnosis not present

## 2021-09-12 LAB — BASIC METABOLIC PANEL
BUN: 9 mg/dL (ref 6–20)
CO2: 20 mmol/L — ABNORMAL LOW (ref 22–32)
Calcium: 8.7 mg/dL — ABNORMAL LOW (ref 8.9–10.3)
Chloride: >130 mmol/L (ref 98–111)
Creatinine, Ser: 0.69 mg/dL (ref 0.44–1.00)
GFR, Estimated: >60 mL/min (ref >60)
Glucose, Bld: 112 mg/dL — ABNORMAL HIGH (ref 70–99)
Potassium: 3.3 mmol/L — ABNORMAL LOW (ref 3.5–5.1)
Sodium: 161 mmol/L (ref 135–145)

## 2021-09-12 LAB — CBC
HCT: 26.5 % — ABNORMAL LOW (ref 36.0–46.0)
Hemoglobin: 7.4 g/dL — ABNORMAL LOW (ref 12.0–15.0)
MCH: 18.6 pg — ABNORMAL LOW (ref 26.0–34.0)
MCHC: 27.9 g/dL — ABNORMAL LOW (ref 30.0–36.0)
MCV: 66.6 fL — ABNORMAL LOW (ref 80.0–100.0)
Platelets: 318 10*3/uL (ref 150–400)
RBC: 3.98 MIL/uL (ref 3.87–5.11)
RDW: 20.9 % — ABNORMAL HIGH (ref 11.5–15.5)
WBC: 7.3 10*3/uL (ref 4.0–10.5)
nRBC: 0 % (ref 0.0–0.2)

## 2021-09-12 LAB — GLUCOSE, CAPILLARY
Glucose-Capillary: 156 mg/dL — ABNORMAL HIGH (ref 70–99)
Glucose-Capillary: 147 mg/dL — ABNORMAL HIGH (ref 70–99)
Glucose-Capillary: 149 mg/dL — ABNORMAL HIGH (ref 70–99)
Glucose-Capillary: 134 mg/dL — ABNORMAL HIGH (ref 70–99)
Glucose-Capillary: 159 mg/dL — ABNORMAL HIGH (ref 70–99)
Glucose-Capillary: 152 mg/dL — ABNORMAL HIGH (ref 70–99)

## 2021-09-12 LAB — SODIUM
Sodium: 158 mmol/L — ABNORMAL HIGH (ref 135–145)
Sodium: 157 mmol/L — ABNORMAL HIGH (ref 135–145)
Sodium: 160 mmol/L — ABNORMAL HIGH (ref 135–145)

## 2021-09-12 LAB — HEPARIN LEVEL (UNFRACTIONATED)
Heparin Unfractionated: 0.3 IU/mL (ref 0.30–0.70)
Heparin Unfractionated: 0.4 IU/mL (ref 0.30–0.70)

## 2021-09-12 LAB — MAGNESIUM
Magnesium: 2.4 mg/dL (ref 1.7–2.4)
Magnesium: 2.2 mg/dL (ref 1.7–2.4)

## 2021-09-12 LAB — PHOSPHORUS
Phosphorus: 3.2 mg/dL (ref 2.5–4.6)
Phosphorus: 3.9 mg/dL (ref 2.5–4.6)

## 2021-09-12 LAB — HEMOGLOBIN A1C
Hgb A1c MFr Bld: 5 % (ref 4.8–5.6)
Mean Plasma Glucose: 97 mg/dL

## 2021-09-12 LAB — RPR: RPR Ser Ql: NONREACTIVE

## 2021-09-12 LAB — HOMOCYSTEINE: Homocysteine: 12.6 umol/L (ref 0.0–14.5)

## 2021-09-12 LAB — PHENYTOIN LEVEL, TOTAL: Phenytoin Lvl: 18.1 ug/mL (ref 10.0–20.0)

## 2021-09-12 LAB — PROCALCITONIN: Procalcitonin: <0.1 ng/mL

## 2021-09-12 MED ORDER — ATORVASTATIN CALCIUM 40 MG PO TABS
40.0000 mg | ORAL_TABLET | Freq: Every day | ORAL | Status: DC
  Administered 2021-09-12 – 2021-09-15 (×4): 40 mg
  Filled 2021-09-12 (×3): qty 1

## 2021-09-12 MED ORDER — CALCIUM GLUCONATE-NACL 1-0.675 GM/50ML-% IV SOLN
1.0000 g | Freq: Once | INTRAVENOUS | Status: AC
Start: 2021-09-12 — End: 2021-09-12
  Administered 2021-09-12: 1000 mg via INTRAVENOUS
  Filled 2021-09-12: qty 50

## 2021-09-12 MED ORDER — ATORVASTATIN CALCIUM 40 MG PO TABS: 40.0000 mg | ORAL_TABLET | Freq: Every day | ORAL | Status: DC

## 2021-09-12 MED ORDER — POTASSIUM CHLORIDE 20 MEQ PO PACK
40.0000 meq | PACK | Freq: Once | ORAL | Status: AC
  Administered 2021-09-12: 40 meq
  Filled 2021-09-12: qty 2

## 2021-09-12 MED ORDER — NUTRISOURCE FIBER PO PACK
1.0000 | PACK | Freq: Two times a day (BID) | ORAL | Status: DC
  Administered 2021-09-12 – 2021-09-15 (×7): 1
  Filled 2021-09-12 (×7): qty 1

## 2021-09-12 MED ORDER — ATORVASTATIN CALCIUM 40 MG PO TABS
40.0000 mg | ORAL_TABLET | Freq: Every day | ORAL | Status: DC
Start: 2021-09-12 — End: 2021-09-12
  Filled 2021-09-12: qty 1

## 2021-09-12 MED ORDER — METOPROLOL TARTRATE 25 MG PO TABS
25.0000 mg | ORAL_TABLET | Freq: Two times a day (BID) | ORAL | Status: DC
  Administered 2021-09-12 – 2021-09-15 (×7): 25 mg
  Filled 2021-09-12 (×7): qty 1

## 2021-09-12 MED ORDER — FREE WATER
200.0000 mL | Status: DC
  Administered 2021-09-12: 200 mL

## 2021-09-12 NOTE — Progress Notes (Signed)
Bilateral lower extremity venous duplex has been completed. Preliminary results can be found in CV Proc through chart review.   09/12/21 11:22 AM April Reilly RVT

## 2021-09-12 NOTE — Progress Notes (Signed)
Date and time results received: 09/12/21 0550   Test: Chloride and Sodium Critical Value: Chloride >130 and Sodium 161  Name of Provider Notified: Elink  Orders Received? Or Actions Taken?:  See new orders.

## 2021-09-12 NOTE — Procedures (Addendum)
Patient Name: April Reilly  MRN: [id number]  Epilepsy Attending: Lora Havens  Referring Physician/Provider: Lora Havens, MD Duration: 09/11/2021 1802 to 09/12/2021 1019   Patient history: 51 year old female presenting with large right frontoparietal ischemic infarction with hemorrhagic conversion, mass effect with right to left midline shift and initial stages of midbrain compression. EEG to evaluate for seizure   Level of alertness:  lethargic , asleep   AEDs during EEG study: PHT   Technical aspects: This EEG study was done with scalp electrodes positioned according to the 10-20 International system of electrode placement. Electrical activity was acquired at a sampling rate of 500Hz  and reviewed with a high frequency filter of 70Hz  and a low frequency filter of 1Hz . EEG data were recorded continuously and digitally stored.    Description: No clear posterior dominant rhythm was seen. Sleep was characterized by vertex waves, sleep spindles (12 to 14 Hz), maximal frontocentral region.  EEG showed continuous generalized and lateralized right hemisphere polymorphic disorganized mixed frequencies with predominantly 6-9hz  theta-alpha activity admixed with 13 to 15 Hz beta activity. Lateralized periodic discharges were noted in right hemisphere, maximal right parieto-occipital region with fluctuating frequency of 0.5 to 2hz  at times with overriding rhythmicity ( LPD+R). Hyperventilation and photic stimulation were not performed.      ABNORMALITY - Lateralized periodic discharges with overriding rhythmicity ( LPD +R),  right hemisphere, maximal right parieto-occipital region  - Continuous slow, generalized and lateralized right hemisphere    IMPRESSION: This study showed evidence of epileptogenicity and cortical dysfunction arising from right hemisphere, maximal right parieto-occipital region likely due to underlying hemorrhage. This EEG pattern is on the ictal-interictal continuum with higher  potential for seizure recurrence. Additionally there is moderate diffuse encephalopathy, nonspecific etiology.  No definite seizures were seen throughout the recording.   Valentin Benney Barbra Sarks

## 2021-09-12 NOTE — Evaluation (Signed)
Occupational Therapy Evaluation Patient Details Name: April Reilly MRN: [id number] DOB: 01/05/1971 Today's Date: 09/12/2021   History of Present Illness 51 y.o. female presents to [facility] hospital on 09/10/2021 after fall out of bed followed by AMS. CTH with significant edema in R frontal, parietal, occipital and temporal lobe with superimposed hemorrhage, started on hypertonic saline solution. PMH includes sickle cell trait, anemia.   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 Will need further clarification on PLOF/home set up. Eyes closed throughout session and inconsistently following 1 step commands with increased time. Total A +2 for bed mobility to EOB and ADL tasks due to significant deficits as noted below. Will benefit from extensive rehab and will most likely need 24/7 physical assist after DC home form rehab. Acute OT to follow.      Recommendations for follow up therapy are one component of a multi-disciplinary discharge planning process, led by the attending physician.  Recommendations may be updated based on patient status, additional functional criteria and insurance authorization.   Follow Up Recommendations  Acute inpatient rehab (3hours/day)    Assistance Recommended at Discharge Frequent or constant Supervision/Assistance  Patient can return home with the following Two people to help with walking and/or transfers;Two people to help with bathing/dressing/bathroom;Assistance with feeding;Assistance with cooking/housework;Direct supervision/assist for medications management;Direct supervision/assist for financial management;Assist for transportation;Help with stairs or ramp for entrance    Functional Status Assessment  Patient has had a recent decline in their functional status and demonstrates the ability to make significant improvements in function in a reasonable and predictable amount of time.  Equipment Recommendations   BSC/3in1;Tub/shower bench;Wheelchair (measurements OT);Wheelchair cushion (measurements OT);Hospital bed    Recommendations for Other Services Rehab consult     Precautions / Restrictions Precautions Precautions: Fall Precaution Comments: cortrak Restrictions Weight Bearing Restrictions: No      Mobility Bed Mobility Overal bed mobility: Needs Assistance Bed Mobility: Supine to Sit, Sit to Supine, Rolling Rolling: Total assist   Supine to sit: Total assist, +2 for physical assistance, HOB elevated Sit to supine: Total assist, +2 for physical assistance, HOB elevated        Transfers                   General transfer comment: not attempted this date      Balance Overall balance assessment: Needs assistance Sitting-balance support: Single extremity supported, Feet unsupported Sitting balance-Leahy Scale: Zero Sitting balance - Comments: totalA, posterior and leftward lean Postural control: Posterior lean, Left lateral lean                                 ADL either performed or assessed with clinical judgement   ADL Overall ADL's : Needs assistance/impaired                                       General ADL Comments: able to wash face with cloth; unable to sustain attention to complete other tasks; asking to use the bathroom, wanting to walk to the toilet, also ready to go home     Vision Ability to See in Adequate Light: 1 Impaired Vision Assessment?: Vision impaired- to be further tested in functional context Additional Comments: eyes closed entire session; when manually opened, dysconjugate gaze     Perception Perception  Comments: deficits; L neglect; will further assess   Praxis Praxis Praxis-Other Comments: difficulty with motor planning    Pertinent Vitals/Pain Pain Assessment Pain Assessment: Faces Faces Pain Scale: Hurts even more Pain Location: neck Pain Descriptors / Indicators: Aching     Hand Dominance  Right   Extremity/Trunk Assessment Upper Extremity Assessment Upper Extremity Assessment: LUE deficits/detail LUE Deficits / Details: LUE responds to pain, abnormal tone (flexor) ; not moving on command however noted spontaneous movement - not functional LUE Sensation: decreased light touch;decreased proprioception LUE Coordination: decreased fine motor;decreased gross motor   Lower Extremity Assessment Lower Extremity Assessment: Defer to PT evaluation LLE Deficits / Details: pt appears to have a flicker of movement into knee extension, pt withdraws to pain. PT notes hip adductor tone   Cervical / Trunk Assessment Cervical / Trunk Assessment: Other exceptions Cervical / Trunk Exceptions: pt with head in left lateral flexion and left rotation. PT provides PROM and stretching to maintain midline but pt appears to have preference for this position, returning without PT support   Communication Communication Communication: Expressive difficulties (dysarthria; hypophonia)   Cognition Arousal/Alertness: Lethargic Behavior During Therapy: Impulsive (reaching at tubes) Overall Cognitive Status: No family/caregiver present to determine baseline cognitive functioning Area of Impairment: Orientation, Attention, Memory, Following commands, Safety/judgement, Awareness, Problem solving                 Orientation Level: Disoriented to, Place, Time, Situation Current Attention Level: Focused Memory: Decreased short-term memory Following Commands: Follows one step commands inconsistently Safety/Judgement: Decreased awareness of safety, Decreased awareness of deficits Awareness: Intellectual Problem Solving: Slow processing, Decreased initiation, Difficulty sequencing, Requires verbal cues, Requires tactile cues       General Comments  VSS on RA    Exercises     Shoulder Instructions      Home Living Family/patient expects to be discharged to:: Private residence Living Arrangements:  Children (daughter) Available Help at Discharge:  (unsure) Type of Home: House Home Access:  (unsure)     Home Layout: One level                   Additional Comments: pt is a limited historian due to lethargy and dysarthria      Prior Functioning/Environment Prior Level of Function : Independent/Modified Independent;Working/employed             Mobility Comments: pt reports she is the Veterinary surgeon at [employer]          OT Problem List: Decreased strength;Decreased range of motion;Decreased activity tolerance;Impaired balance (sitting and/or standing);Impaired vision/perception;Decreased coordination;Decreased cognition;Decreased safety awareness;Decreased knowledge of use of DME or AE;Decreased knowledge of precautions;Cardiopulmonary status limiting activity;Impaired sensation;Impaired tone;Impaired UE functional use      OT Treatment/Interventions: Self-care/ADL training;Therapeutic exercise;Neuromuscular education;DME and/or AE instruction;Therapeutic activities;Splinting;Cognitive remediation/compensation;Visual/perceptual remediation/compensation;Patient/family education;Balance training    OT Goals(Current goals can be found in the care plan section) Acute Rehab OT Goals Patient Stated Goal: to go to the bathroom OT Goal Formulation: Patient unable to participate in goal setting Time For Goal Achievement: 09/26/21 Potential to Achieve Goals: Good  OT Frequency: Min 2X/week    Co-evaluation PT/OT/SLP Co-Evaluation/Treatment: Yes Reason for Co-Treatment: Complexity of the patient's impairments (multi-system involvement);Necessary to address cognition/behavior during functional activity;For patient/therapist safety;To address functional/ADL transfers PT goals addressed during session: Mobility/safety with mobility;Balance;Strengthening/ROM OT goals addressed during session: ADL's and self-care      AM-PAC OT "6 Clicks" Daily Activity     Outcome  Measure Help from  another person eating meals?: Total Help from another person taking care of personal grooming?: A Lot Help from another person toileting, which includes using toliet, bedpan, or urinal?: Total Help from another person bathing (including washing, rinsing, drying)?: Total Help from another person to put on and taking off regular upper body clothing?: Total Help from another person to put on and taking off regular lower body clothing?: Total 6 Click Score: 7   End of Session Nurse Communication: Mobility status  Activity Tolerance: Patient limited by lethargy;Patient limited by fatigue Patient left: in bed;with restraints reapplied;with bed alarm set;with call bell/phone within reach  OT Visit Diagnosis: Unsteadiness on feet (R26.81);Other abnormalities of gait and mobility (R26.89);Muscle weakness (generalized) (M62.81);Low vision, both eyes (H54.2);Apraxia (R48.2);Other symptoms and signs involving the nervous system (R29.898);Other symptoms and signs involving cognitive function;Hemiplegia and hemiparesis Hemiplegia - Right/Left: Left Hemiplegia - dominant/non-dominant: Non-Dominant Hemiplegia - caused by: Cerebral infarction;Nontraumatic intracerebral hemorrhage                Time: 5462-7035 OT Time Calculation (min): 31 min Charges:  OT General Charges $OT Visit: 1 Visit OT Evaluation $OT Eval High Complexity: 1 High  [place], OT/L   Acute OT Clinical Specialist [place] Pager [phone] Office [phone]   [facility] 09/12/2021, 4:35 PM

## 2021-09-12 NOTE — Progress Notes (Signed)
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  Labs: Recent Labs    09/10/21 0847 09/10/21 1437 09/10/21 1627 09/11/21 0419 09/11/21 0838 09/11/21 1633 09/12/21 0056  HGB 9.1*  --   --  8.0*  --   --   --   HCT 32.0*  --   --  26.6*  --   --   --   PLT 402*  --   --  325  --   --   --   APTT  --  30  --   --   --   --   --   LABPROT  --  15.5*  --   --   --   --   --   INR  --  1.2  --   --   --   --   --   HEPARINUNFRC  --   --   --   --  <0.10* <0.10* 0.30  CREATININE 0.79  --   --  0.75  --  0.73  --   CKTOTAL  --   --  230  --   --   --   --   CKMB  --   --  1.6  --   --   --   --     Assessment/Plan:  51yo female therapeutic on heparin after conservative rate changes. Will continue infusion at current rate of 850 units/hr and confirm stable with additional level.   Wynona Neat, PharmD, BCPS  09/12/2021,1:44 AM

## 2021-09-12 NOTE — Progress Notes (Signed)
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  No Known Allergies  Patient Measurements: Height: 4\' 11"  (149.9 cm) Weight: 60.4 kg (133 lb 2.5 oz) IBW/kg (Calculated) : 43.2 Heparin Dosing Weight: 55kg  Vital Signs: Temp: 99.8 F (37.7 C) (02/22 0800) Temp Source: Axillary (02/22 0800) BP: 145/99 (02/22 1000) Pulse Rate: 127 (02/22 1000)  Labs: Recent Labs    09/10/21 0847 09/10/21 1437 09/10/21 1627 09/11/21 0419 09/11/21 0838 09/11/21 1633 09/12/21 0056 09/12/21 0507 09/12/21 0946  HGB 9.1*  --   --  8.0*  --   --   --  7.4*  --   HCT 32.0*  --   --  26.6*  --   --   --  26.5*  --   PLT 402*  --   --  325  --   --   --  318  --   APTT  --  30  --   --   --   --   --   --   --   LABPROT  --  15.5*  --   --   --   --   --   --   --   INR  --  1.2  --   --   --   --   --   --   --   HEPARINUNFRC  --   --   --   --    < > <0.10* 0.30  --  0.40  CREATININE 0.79  --   --  0.75  --  0.73  --  0.69  --   CKTOTAL  --   --  230  --   --   --   --   --   --   CKMB  --   --  1.6  --   --   --   --   --   --    < > = values in this interval not displayed.     Estimated Creatinine Clearance: 66.5 mL/min (by C-G formula based on SCr of 0.69 mg/dL).   Assessment: 51yo female presents to ED w/ AMS and decreased LOC and CT reveals large right hemisphere stroke with mass effect and hemorrhagic conversion.  CT venogram shows cerebral venous sinus thromosis and patient was started on IV heparin.  D/w neuro Dr. Rory Percy to confirm anticoagulation in this pt with concurrent bleed; will dose and adjust cautiously with close monitoring.  Heparin level remains therapeutic on 850 units/hr, 2nd head CT this am shows stable hemorrhage  Goal of Therapy:  Heparin level 0.3-0.5 units/ml Monitor platelets by anticoagulation protocol: Yes   Plan:  Continue heparin gtt at 850  units/hr Daily heparin level, CBC, s/s bleeding  Bertis Ruddy, PharmD Clinical Pharmacist ED Pharmacist Phone # [phone] 09/12/2021 11:05 AM

## 2021-09-12 NOTE — Progress Notes (Signed)
LTM D/C. No skin breakdown. [facility] was notified.

## 2021-09-12 NOTE — Progress Notes (Signed)
[facility] Progress Note Patient Name: Sevana Grandinetti DOB: 04/05/1971 MRN: [id number]   Date of Service  09/12/2021  HPI/Events of Note  Multiple issues: 1. Na+ = 161 and Cl- > 120, 2. K+ = 3.3 and Creatinine = 0.69 and Ca++ = 8.7 which corrects to 8.78 (Low) given albumin = 3.9.   eICU Interventions  Plan: Will replace K+ and Ca++. D/C 0.9 NaCl IV fluid. Free water 200 mL per tube now and Q 4 hours. Continue to trend Na+.     Intervention Category Major Interventions: Electrolyte abnormality - evaluation and management  Shaquill Iseman [place] 09/12/2021, 6:18 AM

## 2021-09-12 NOTE — Progress Notes (Signed)
SLP Cancellation Note  Patient Details Name: April Reilly MRN: [id number] DOB: 1971-06-13   Cancelled treatment:       Reason Eval/Treat Not Completed: Patient's level of consciousness. Pts arousal not yet appropriate for SLP assessments. Will continue to follow for readiness.    Roben Schliep, Katherene Ponto 09/12/2021, 9:43 AM

## 2021-09-12 NOTE — Progress Notes (Signed)
STROKE TEAM PROGRESS NOTE   INTERVAL HISTORY Her daughter and RN are at the bedside.  Patient afebrile this morning, per RN, patient was able to answer some orientation questions and follow some simple commands this morning.  Still lethargic with dysarthria, right moving more than left.  However, on my examination, patient was just after a bath, very lethargic and did not follow commands.  Vitals:   09/12/21 1517 09/12/21 1525 09/12/21 1600 09/12/21 1700  BP: (!) 167/151 (!) 132/91 (!) 153/95 (!) 145/93  Pulse: (!) 119 (!) 112 (!) 101 (!) 108  Resp: (!) 24 (!) 25 17 (!) 23  Temp:   99 F (37.2 C)   TempSrc:   Axillary   SpO2: 97% 97%  97%  Weight:      Height:       CBC:  Recent Labs  Lab 09/10/21 0847 09/11/21 0419 09/12/21 0507  WBC 12.9* 8.4 7.3  NEUTROABS 11.2*  --   --   HGB 9.1* 8.0* 7.4*  HCT 32.0* 26.6* 26.5*  MCV 66.0* 63.8* 66.6*  PLT 402* 325 254   Basic Metabolic Panel:  Recent Labs  Lab 09/11/21 1633 09/11/21 2120 09/12/21 0507 09/12/21 0946  NA 160*   159*   < > 161* 158*  K 3.7  --  3.3*  --   CL >130*  --  >130*  --   CO2 16*  --  20*  --   GLUCOSE 161*  --  112*  --   BUN 9  --  9  --   CREATININE 0.73  --  0.69  --   CALCIUM 9.0  --  8.7*  --   MG 2.4  --  2.4  --   PHOS 1.5*  --  3.2  --    < > = values in this interval not displayed.   Lipid Panel:  Recent Labs  Lab 09/11/21 0422  CHOL 139  TRIG 47  HDL 46  CHOLHDL 3.0  VLDL 9  LDLCALC 84   HgbA1c:  Recent Labs  Lab 09/11/21 0422  HGBA1C 5.0   Urine Drug Screen:  Recent Labs  Lab 09/10/21 1313  LABOPIA NONE DETECTED  COCAINSCRNUR NONE DETECTED  LABBENZ NONE DETECTED  AMPHETMU NONE DETECTED  THCU NONE DETECTED  LABBARB NONE DETECTED    Alcohol Level No results for input(s): ETH in the last 168 hours.  IMAGING past 24 hours CT HEAD WO CONTRAST (5MM)  Result Date: 09/12/2021 CLINICAL DATA:  Stroke follow-up EXAM: CT HEAD WITHOUT CONTRAST TECHNIQUE: Contiguous axial  images were obtained from the base of the skull through the vertex without intravenous contrast. RADIATION DOSE REDUCTION: This exam was performed according to the departmental dose-optimization program which includes automated exposure control, adjustment of the mA and/or kV according to patient size and/or use of iterative reconstruction technique. COMPARISON:  Head CT from yesterday FINDINGS: Brain: Cytotoxic pattern edema in the right temporal, occipital, and lower frontal parietal regions with patchy internal parenchymal hemorrhage also affecting the collapsed right occipital horn. Diffuse effacement of sulci. Midline shift measures 9 mm. No ventriculomegaly or change in ventricular volume. No compressive arterial infarct. Vascular: See prior vessel imaging. Skull: Normal. Negative for fracture or focal lesion. Sinuses/Orbits: No acute finding. IMPRESSION: Unchanged cytotoxic edema with hemorrhage in the right cerebral hemisphere. Midline shift measures 9 mm. Stable ventricles. Electronically Signed   By: Jorje Guild M.D.   On: 09/12/2021 05:01   VAS [place] LOWER EXTREMITY VENOUS (DVT)  Result Date: 09/12/2021  Lower Venous DVT Study Patient Name:  CECEILIA CEPHUS  Date of Exam:   09/12/2021 Medical Rec #: [id number]      Accession #:    [id number] Date of Birth: 04/15/1971      Patient Gender: F Patient Age:   51 years Exam Location:  [facility] Procedure:      VAS [place] LOWER EXTREMITY VENOUS (DVT) Referring Phys: Janine Ores --------------------------------------------------------------------------------  Indications: Stroke.  Risk Factors: None identified. Comparison Study: No prior studies. Performing Technologist: Oliver Hum RVT  Examination Guidelines: A complete evaluation includes B-mode imaging, spectral Doppler, color Doppler, and power Doppler as needed of all accessible portions of each vessel. Bilateral testing is considered an integral part of a complete examination. Limited  examinations for reoccurring indications may be performed as noted. The reflux portion of the exam is performed with the patient in reverse Trendelenburg.  +---------+---------------+---------+-----------+----------+--------------+  RIGHT     Compressibility Phasicity Spontaneity Properties Thrombus Aging  +---------+---------------+---------+-----------+----------+--------------+  CFV       Full            Yes       Yes                                    +---------+---------------+---------+-----------+----------+--------------+  SFJ       Full                                                             +---------+---------------+---------+-----------+----------+--------------+  FV Prox   Full                                                             +---------+---------------+---------+-----------+----------+--------------+  FV Mid    Full                                                             +---------+---------------+---------+-----------+----------+--------------+  FV Distal Full                                                             +---------+---------------+---------+-----------+----------+--------------+  PFV       Full                                                             +---------+---------------+---------+-----------+----------+--------------+  POP       Full            Yes  Yes                                    +---------+---------------+---------+-----------+----------+--------------+  PTV       Full                                                             +---------+---------------+---------+-----------+----------+--------------+  PERO      Full                                                             +---------+---------------+---------+-----------+----------+--------------+   +---------+---------------+---------+-----------+----------+--------------+  LEFT      Compressibility Phasicity Spontaneity Properties Thrombus Aging   +---------+---------------+---------+-----------+----------+--------------+  CFV       Full            Yes       Yes                                    +---------+---------------+---------+-----------+----------+--------------+  SFJ       Full                                                             +---------+---------------+---------+-----------+----------+--------------+  FV Prox   Full                                                             +---------+---------------+---------+-----------+----------+--------------+  FV Mid    Full                                                             +---------+---------------+---------+-----------+----------+--------------+  FV Distal Full            Yes       Yes                                    +---------+---------------+---------+-----------+----------+--------------+  PFV       Full                                                             +---------+---------------+---------+-----------+----------+--------------+  POP  Full            Yes       Yes                                    +---------+---------------+---------+-----------+----------+--------------+  PTV       Full                                                             +---------+---------------+---------+-----------+----------+--------------+  PERO      Full                                                             +---------+---------------+---------+-----------+----------+--------------+     Summary: RIGHT: - There is no evidence of deep vein thrombosis in the lower extremity.  - No cystic structure found in the popliteal fossa.  LEFT: - There is no evidence of deep vein thrombosis in the lower extremity.  - No cystic structure found in the popliteal fossa.  *See table(s) above for measurements and observations.    Preliminary     PHYSICAL EXAM  Physical Exam  Constitutional: Appears well-developed and well-nourished.  Cardiovascular: Normal rate and regular rhythm with  intermittent tachycardia.  Respiratory: non-labored breathing at rest, with stimulation, patient woke up with tachypnea  Neuro: Mental Status: Patient is unresponsive to verbal stimuli, she moans with stimulation. Eyes do not open to command or stimulation.  Cranial Nerves: Right eye dysconjugate to the right , right pupil 3->12mm, left pupil 3->22mm, Does not blink to threat, Facial movement weak bilaterally, cough and gag intact, Tongue does not protrude Motor: Tone is normal. Bulk is normal. Moves all extremities antigravity, Moves right upper and lower extremities more than left extremities.  Sensory: Withdraws to pain in all extremities Cerebellar: Unable to participate   ASSESSMENT/PLAN Ms. Maryah Marinaro is a 51 y.o. female with history of sickle cell,  presenting with altered mental status, decreased LOC, flu like symptoms and possible unwitnessed fall out of bed. CT shows  large region of cortical subcortical edema within the right frontal parietal occipital and temporal lobes as well as right insula and subinsular region with patchy foci of acute parenchymal hemorrhage in the same areas. Concern for an acute, early subacute ischemia versus thrombosis of the venous system. 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 Heparin stroke protocol initiated. Hypertonic saline for cerebral edema. CT scan 2/21 showed progressive increase in midline shift from 26mm to 36mm, unchanged hemorrhage in right cerebral hemisphere. Repeat CT scan ordered for 2/22. Neurosurgery recommends medical management with hypertonic saline as opposed to crani due to heparin infusion.  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 Midline shift increased from 9 to 11 mm. CTA head & neck- Multifocal narrowing in the  right-greater-than-left A1, right M1, and proximal right M2 segments, likely reflecting vasospasm. Narrowing in the right petrous and cavernous ICA may also be related to known intracranial hemorrhage. Hyperdensity and IPH in the right cerebral hemisphere, concerning for infarct with hemorrhagic conversion, with 11 mm of right-to-left midline shift, and effacement of the basal cisterns. 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 MRI- Large area of cortical infarction in the right temporal, occipital, parietal, and posterior frontal lobes, with significant associated edema and redemonstrated intraparenchymal hemorrhage. The mass effect causes 11 mm of right-to-left midline shift, medial displacement of the right uncus and right temporal horn and effacement of the basal cisterns CT repeat in a.m. stable cerebral edema [place] Lower extremity duplex no DVT 2D Echo EF 60-65%, normal LV function LDL 84 HgbA1c pending VTE prophylaxis - IV Heparin No antithrombotic prior to admission, now on heparin IV.  Therapy recommendations:  Pending Disposition:  Pending  Cerebral edema- concern for uncal herniation Repeat CT scan midline shift increase from 50mm -> 42mm Repeat CT 2/22 midline shift at 9 mm Hypertonic saline 80ml/hr -> NS->free water 200 Q4 Na goal 150-155 Na- 146-> 153->157->161->158  Concern for seizure activity EEG- evidence of epileptogenicity and cortical dysfunction, no definite seizures seen throughout the recording, high potential for seizure recurrence. LTM EEG 2/21 evidence of epileptogenicity and cortical dysfunction arising from right hemisphere, maximal right parieto-occipital region likely due to underlying hemorrhage LTM EEG 2/22 evidence of epileptogenicity and cortical dysfunction arising from right  hemisphere, maximal right parieto-occipital region likely due to underlying hemorrhage. This EEG pattern is on the ictal-interictal continuum with higher potential for seizure recurrence. Additionally there is moderate diffuse encephalopathy, nonspecific etiology. Phenytoin 100mg  q8hr followed by load Dilantin level 18.3-> 18.1 LTM d/c'ed by CCM Seizure precautions  Hypertension Stable BP goal less than 160 Long-term BP goal normotensive  Hyperlipidemia Home meds:  None LDL 84, goal < 70 Put on Lipitor 40 Continue statin on discharge  Diabetes type II Controlled Home meds:  None HgbA1c pending, goal < 7.0 CBGs  SSI  Dysphagia Not able to pass swallow Cortrak placed Tube feeding initiated- Osmolite 1.5 @ 41ml/hr  Febrile Tmax 101.6->100.6->afebrile respiratory failure Trend WBC- 12.9-> 8.4->7.3 Central fever?  PRN tylenol given Off rocephin, azithromycine and acyclovir  Aspiration pneumonia CCM on board [place]cal 0.11 Repeat CXR- interval development of right lower lobe airspace opacity. Family describes 3 days of URI symptoms. CCM discontinued azithromycin and ceftriaxone Two doses of acyclovir given, low suspicion- d/c'd  Hospital day # 2  Rosalin Hawking, MD PhD Stroke Neurology 09/12/2021 6:22 PM  This patient is critically ill due to large venous infarct with hemorrhagic transformation, severe cerebral edema, fever, encephalopathy, seizure, dysphagia and at significant risk of neurological worsening, death form brain herniation, hydrocephalus, stroke, status epilepticus, sepsis. This patient's care requires constant monitoring of vital signs, hemodynamics, respiratory and cardiac monitoring, review of multiple databases, neurological assessment, discussion with family, other specialists and medical decision making of high complexity. I spent 45 minutes of neurocritical care time in the care of this patient. I had long discussion with daughter at bedside, updated pt current  condition, treatment plan and potential prognosis, and answered all the questions.  She expressed understanding and appreciation.  I also discussed CCM Dr. Lynetta Mare   To contact Stroke Continuity provider, please refer to [email or web address] After hours, contact General Neurology

## 2021-09-12 NOTE — Progress Notes (Signed)
Patient's HR sustaining in the upper 120's -130's when awake. BP's appropriate, CCM and Neuro made aware. Enteral Lopressor ordered.

## 2021-09-12 NOTE — Progress Notes (Signed)
NAME:  April Reilly, MRN:  [id number], DOB:  [birth year], LOS: 2 ADMISSION DATE:  09/10/2021, CONSULTATION DATE:  2/20 REFERRING MD:  Stroke team, CHIEF COMPLAINT:  concern for airway protection   History of Present Illness:  51 year old female, presented to ER via EMS 2/20 w/ altered mental status following 3 days of URI symptoms. Family also reporting more sleepy and difficult to arouse over the 3 days as well. On arrival pt grunting to interactions. Moving right side only, left hemiparesis. CT and exam c/w large right frontoparietal infarct w/ hemorrhagic conversion and cerebral edema  Was seen by neuro-surg who recommended supportive care including HT saline. PCCM asked to assist w/ care.   Pertinent  Medical History  Sickle cell trait  Heavy vaginal bleeding.  Anemia   Significant Hospital Events: Including procedures, antibiotic start and stop dates in addition to other pertinent events   2/20: admitted after being found obtunded and 3d of URI symptoms. CT head -> significant (R) frontal, parietal, occipital and temporal lobe cerebral edema w/ superimposed hemorrhage. Admitted by neuro w/ working dx right frontoparietal infarct w/ hemorrhagic conversion and cerebral edema. Sen by neuro-surg. Recommended HT saline infusion but not emergent decompressive crani. PCCM asked to assist w/ care.  2/21 Venogram showed venous sinus thrombosis, heparin started 2/22 discontinued hypertonic saline  Interim History / Subjective:  Patient assessed at bedside this AM. She is following commands, able to state name, where she is, and date. She continues to have large amount of diarrhea.  Objective   Blood pressure (!) 148/105, pulse (!) 122, temperature 98 F (36.7 C), temperature source Axillary, resp. rate (!) 26, height 4\' 11"  (1.499 m), weight 60.4 kg, SpO2 98 %.        Intake/Output Summary (Last 24 hours) at 09/12/2021 0754 Last data filed at 09/12/2021 0700 Gross per 24 hour  Intake 2236.2  ml  Output 1765 ml  Net 471.2 ml    Filed Weights   09/10/21 1245 09/11/21 0500 09/12/21 0500  Weight: 62 kg 62.7 kg 60.4 kg    Examination: General appearance: Able to follow commands and move right upper extremity and lower extremities bilaterally, oriented x3 Eyes: anicteric sclerae, moist conjunctivae HENT: NCAT  Neck: Trachea midline Lungs: CTAB, no crackles, no wheeze, with normal respiratory effort and no intercostal retractions CV: RRR, S1, S2, no MRGs  Abdomen: Soft, non-tender; non-distended, BS present  Extremities: No peripheral edema, radial and DP pulses present bilaterally  Skin: Normal temperature, turgor and texture; no rash Neuro: pupils equal and reactive to light  Na 161, discontinued hypertonic Cl > 130 K 3.3, repleted Ca 8.7, repleted CO2 20 Hgb 8-> 7.4 Phenytoin level therapeutic RPR non reactive TSH hypo  Resolved Hospital Problem list   Lactic acidosis leukocytosis  Assessment & Plan:  Acute Right frontoparietal ischemic infarction with hemorrhagic conversion Cerebral edema with associated brain compression Mass effect with right to left midline shift  Na up to 161 overnight. Hypertonic saline switched to NS and free water given. -Serial neuro checks q 4 hrs -repeat Na in 6 hours. Stopping hypertonic saline and allowing sodium to re-equilibrate slowly. No need for free water at this time. -SBP goal 130-150  -discontinue long term monitor EEG  Venous sinus thrombosis CT venogram with right transverse sinus and sigmoid sinus dural venous sinus thrombosis. Echo showed no evidence of thrombus, EF 60-65%. - Continue Heparin per pharmacy  Viral pneumonia Lungs clear to auscultation, no requiring supplemental oxygen.  Microcytic anemia  Baseline Hgb in 7.4 in patient with history of heavy vaginal bleeding. - trend CBC - if hgb <7.0 transfuse - iron studies and TIBC  Hypothyroidism TSH low. - f/u T3 - f/u free T4  Dysphagia Cortrak in  place. Tube feeds started. She has been tolerating tube feeds.  Diarrhea  Patient with several episodes of liquid stool yesterday. FMS in place. - Fiber packet  Hyperglycemia A1c pending - SSI q 4 hours  Best Practice (right click and "Reselect all SmartList Selections" daily)   Diet/type: NPO, cortrak in place DVT prophylaxis: systemic heparin GI prophylaxis: PPI Lines: yes and it is still needed PICC line Foley:  Yes, and it is still needed Code Status:  full code Last date of multidisciplinary goals of care discussion [updated with daughters at bedside 2/22]  April Reilly, D.O.  Internal Medicine Resident, PGY-1 [facility]  Pager: [phone] 7:54 AM, 09/12/2021

## 2021-09-12 NOTE — Evaluation (Signed)
Physical Therapy Evaluation Patient Details Name: April Reilly MRN: [id number] DOB: 10-05-1970 Today's Date: 09/12/2021  History of Present Illness  51 y.o. female presents to [facility] hospital on 09/10/2021 after fall out of bed followed by AMS. CTH with significant edema in R frontal, parietal, occipital and temporal lobe with superimposed hemorrhage, started on hypertonic saline solution. PMH includes sickle cell trait, anemia.  Clinical Impression  Pt presents to PT with deficits in communication, cognition, functional mobility, balance, gait, strength, motor control. Pt with left lateral lean and left hemiplegia. Pt with reduced awareness of left side during this session, although left head turn is peculiar. Pt will benefit from frequent mobilization in an effort to improve arousal and aide in improving balance and mobility quality. PT recommends AIR placement at this time if the pt is able to improve her ability to participate in PT session.       Recommendations for follow up therapy are one component of a multi-disciplinary discharge planning process, led by the attending physician.  Recommendations may be updated based on patient status, additional functional criteria and insurance authorization.  Follow Up Recommendations Acute inpatient rehab (3hours/day) (hopeful for progression of level of alertness and participation)    Assistance Recommended at Discharge Frequent or constant Supervision/Assistance  Patient can return home with the following  Two people to help with walking and/or transfers;Two people to help with bathing/dressing/bathroom;Assistance with cooking/housework;Assistance with feeding;Direct supervision/assist for medications management;Direct supervision/assist for financial management;Assist for transportation;Help with stairs or ramp for entrance    Equipment Recommendations Wheelchair (measurements PT);Wheelchair cushion (measurements PT);Hospital bed (hoyer lift)   Recommendations for Other Services  Rehab consult    Functional Status Assessment Patient has had a recent decline in their functional status and demonstrates the ability to make significant improvements in function in a reasonable and predictable amount of time.     Precautions / Restrictions Precautions Precautions: Fall Precaution Comments: cortrak Restrictions Weight Bearing Restrictions: No      Mobility  Bed Mobility Overal bed mobility: Needs Assistance Bed Mobility: Supine to Sit, Sit to Supine, Rolling Rolling: Total assist   Supine to sit: Total assist, +2 for physical assistance, HOB elevated Sit to supine: Total assist, +2 for physical assistance, HOB elevated        Transfers                        Ambulation/Gait                  Stairs            Wheelchair Mobility    Modified Rankin (Stroke Patients Only)       Balance Overall balance assessment: Needs assistance Sitting-balance support: Single extremity supported, Feet unsupported Sitting balance-Leahy Scale: Zero Sitting balance - Comments: totalA, posterior and leftward lean Postural control: Posterior lean, Left lateral lean                                   Pertinent Vitals/Pain Pain Assessment Pain Assessment: Faces Faces Pain Scale: Hurts even more Pain Location: neck Pain Descriptors / Indicators: Aching Pain Intervention(s): Monitored during session    Home Living Family/patient expects to be discharged to:: Private residence Living Arrangements: Children (daughter) Available Help at Discharge:  (unsure) Type of Home: House Home Access:  (unsure)       Home Layout: One level   Additional Comments: pt  is a limited historian due to lethargy and dysarthria    Prior Function Prior Level of Function : Independent/Modified Independent;Working/employed             Mobility Comments: 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    Lower Extremity Assessment Lower Extremity Assessment: LLE deficits/detail LLE Deficits / Details: pt appears to have a flicker of movement into knee extension, pt withdraws to pain. PT notes hip adductor tone    Cervical / Trunk Assessment Cervical / Trunk Assessment: Other exceptions Cervical / Trunk Exceptions: pt with head in left lateral flexion and left rotation. PT provides PROM and stretching to maintain midline but pt appears to have preference for this position, returning without PT support  Communication   Communication: Expressive difficulties (dysarthria)  Cognition Arousal/Alertness: Lethargic Behavior During Therapy: Impulsive (reaching at tubes) Overall Cognitive Status: No family/caregiver present to determine baseline cognitive functioning Area of Impairment: Orientation, Attention, Memory, Following commands, Safety/judgement, Awareness, Problem solving                 Orientation Level: Disoriented to, Place, Time, Situation Current Attention Level: Focused Memory: Decreased recall of precautions, Decreased short-term memory Following Commands: Follows one step commands inconsistently Safety/Judgement: Decreased awareness of safety, Decreased awareness of deficits Awareness: Intellectual Problem Solving: Slow processing, Decreased initiation          General Comments General comments (skin integrity, edema, etc.): VSS on RA, pt maintains eyes closed for majority of session, is observed slightly opening left eye some with changes in position or persistent verbal cues    Exercises     Assessment/Plan    PT Assessment Patient needs continued PT services  PT Problem List Decreased strength;Decreased activity tolerance;Decreased  balance;Decreased mobility;Decreased coordination;Decreased cognition;Decreased knowledge of use of DME;Decreased safety awareness;Decreased knowledge of precautions;Cardiopulmonary status limiting activity;Impaired tone;Pain       PT Treatment Interventions DME instruction;Stair training;Gait training;Functional mobility training;Therapeutic activities;Therapeutic exercise;Balance training;Neuromuscular re-education;Patient/family education;Cognitive remediation;Wheelchair mobility training    PT Goals (Current goals can be found in the Care Plan section)  Acute Rehab PT Goals Patient Stated Goal: to improve reduce caregiver burden PT Goal Formulation: Patient unable to participate in goal setting Time For Goal Achievement: 09/26/21 Potential to Achieve Goals: Fair    Frequency Min 4X/week     Co-evaluation PT/OT/SLP Co-Evaluation/Treatment: Yes Reason for Co-Treatment: Complexity of the patient's impairments (multi-system involvement);Necessary to address cognition/behavior during functional activity;To address functional/ADL transfers;For patient/therapist safety PT goals addressed during session: Mobility/safety with mobility;Balance;Strengthening/ROM         AM-PAC PT "6 Clicks" Mobility  Outcome Measure Help needed turning from your back to your side while in a flat bed without using bedrails?: Total Help needed moving from lying on your back to sitting on the side of a flat bed without using bedrails?: Total Help needed moving to and from a bed to a chair (including a wheelchair)?: Total Help needed standing up from a chair using your arms (e.g., wheelchair or bedside chair)?: Total Help needed to walk in hospital room?: Total Help needed climbing 3-5 steps with a railing? 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

## 2021-09-13 DIAGNOSIS — G08 Intracranial and intraspinal phlebitis and thrombophlebitis: Secondary | ICD-10-CM | POA: Diagnosis not present

## 2021-09-13 DIAGNOSIS — R4182 Altered mental status, unspecified: Secondary | ICD-10-CM | POA: Diagnosis not present

## 2021-09-13 DIAGNOSIS — G9341 Metabolic encephalopathy: Secondary | ICD-10-CM | POA: Diagnosis not present

## 2021-09-13 DIAGNOSIS — R509 Fever, unspecified: Secondary | ICD-10-CM | POA: Diagnosis not present

## 2021-09-13 DIAGNOSIS — D72829 Elevated white blood cell count, unspecified: Secondary | ICD-10-CM | POA: Diagnosis not present

## 2021-09-13 LAB — IRON AND TIBC
Iron: 17 ug/dL — ABNORMAL LOW (ref 28–170)
Saturation Ratios: 5 % — ABNORMAL LOW (ref 10.4–31.8)
TIBC: 370 ug/dL (ref 250–450)
UIBC: 353 ug/dL

## 2021-09-13 LAB — BASIC METABOLIC PANEL
Anion gap: 12 (ref 5–15)
BUN: 11 mg/dL (ref 6–20)
CO2: 21 mmol/L — ABNORMAL LOW (ref 22–32)
Calcium: 9 mg/dL (ref 8.9–10.3)
Chloride: 124 mmol/L — ABNORMAL HIGH (ref 98–111)
Creatinine, Ser: 0.68 mg/dL (ref 0.44–1.00)
GFR, Estimated: >60 mL/min (ref >60)
Glucose, Bld: 152 mg/dL — ABNORMAL HIGH (ref 70–99)
Potassium: 3.1 mmol/L — ABNORMAL LOW (ref 3.5–5.1)
Sodium: 157 mmol/L — ABNORMAL HIGH (ref 135–145)

## 2021-09-13 LAB — CBC
HCT: 27.8 % — ABNORMAL LOW (ref 36.0–46.0)
Hemoglobin: 7.7 g/dL — ABNORMAL LOW (ref 12.0–15.0)
MCH: 18.5 pg — ABNORMAL LOW (ref 26.0–34.0)
MCHC: 27.7 g/dL — ABNORMAL LOW (ref 30.0–36.0)
MCV: 66.7 fL — ABNORMAL LOW (ref 80.0–100.0)
Platelets: 340 10*3/uL (ref 150–400)
RBC: 4.17 MIL/uL (ref 3.87–5.11)
RDW: 21.1 % — ABNORMAL HIGH (ref 11.5–15.5)
WBC: 8 10*3/uL (ref 4.0–10.5)
nRBC: 0.5 % — ABNORMAL HIGH (ref 0.0–0.2)

## 2021-09-13 LAB — PHENYTOIN LEVEL, TOTAL: Phenytoin Lvl: 18.6 ug/mL (ref 10.0–20.0)

## 2021-09-13 LAB — HEMOGLOBIN A1C
Hgb A1c MFr Bld: 5.3 % (ref 4.8–5.6)
Mean Plasma Glucose: 105 mg/dL

## 2021-09-13 LAB — PHOSPHATIDYLSERINE ANTIBODIES
Phosphatydalserine, IgA: 3 APS Units (ref 0–19)
Phosphatydalserine, IgG: <9 Units (ref 0–30)
Phosphatydalserine, IgM: 20 Units (ref 0–30)

## 2021-09-13 LAB — SODIUM
Sodium: 156 mmol/L — ABNORMAL HIGH (ref 135–145)
Sodium: 155 mmol/L — ABNORMAL HIGH (ref 135–145)
Sodium: 153 mmol/L — ABNORMAL HIGH (ref 135–145)

## 2021-09-13 LAB — GLUCOSE, CAPILLARY
Glucose-Capillary: 158 mg/dL — ABNORMAL HIGH (ref 70–99)
Glucose-Capillary: 148 mg/dL — ABNORMAL HIGH (ref 70–99)
Glucose-Capillary: 178 mg/dL — ABNORMAL HIGH (ref 70–99)
Glucose-Capillary: 89 mg/dL (ref 70–99)
Glucose-Capillary: 146 mg/dL — ABNORMAL HIGH (ref 70–99)
Glucose-Capillary: 168 mg/dL — ABNORMAL HIGH (ref 70–99)

## 2021-09-13 LAB — BETA-2-GLYCOPROTEIN I ABS, IGG/M/A
Beta-2 Glyco I IgG: <9 GPI IgG units (ref 0–20)
Beta-2-Glycoprotein I IgA: <9 GPI IgA units (ref 0–25)
Beta-2-Glycoprotein I IgM: <9 GPI IgM units (ref 0–32)

## 2021-09-13 LAB — T4, FREE: Free T4: 0.86 ng/dL (ref 0.61–1.12)

## 2021-09-13 LAB — HEPARIN LEVEL (UNFRACTIONATED): Heparin Unfractionated: 0.42 IU/mL (ref 0.30–0.70)

## 2021-09-13 LAB — FERRITIN: Ferritin: 20 ng/mL (ref 11–307)

## 2021-09-13 LAB — CARDIOLIPIN ANTIBODIES, IGG, IGM, IGA
Anticardiolipin IgA: 10 APL U/mL (ref 0–11)
Anticardiolipin IgG: <9 GPL U/mL (ref 0–14)
Anticardiolipin IgM: <9 MPL U/mL (ref 0–12)

## 2021-09-13 MED ORDER — ENSURE ENLIVE PO LIQD
237.0000 mL | Freq: Two times a day (BID) | ORAL | Status: DC
  Administered 2021-09-13 – 2021-09-15 (×4): 237 mL via ORAL

## 2021-09-13 MED ORDER — ALTEPLASE 2 MG IJ SOLR: 2.0000 mg | Freq: Once | INTRAMUSCULAR | Status: AC

## 2021-09-13 MED ORDER — POTASSIUM CHLORIDE 10 MEQ/50ML IV SOLN
10.0000 meq | INTRAVENOUS | Status: AC
  Administered 2021-09-13 (×4): 10 meq via INTRAVENOUS
  Filled 2021-09-13 (×4): qty 50

## 2021-09-13 MED ORDER — ALTEPLASE 2 MG IJ SOLR
INTRAMUSCULAR | Status: AC
  Administered 2021-09-13: 2 mg
  Filled 2021-09-13: qty 2

## 2021-09-13 MED ORDER — SENNOSIDES-DOCUSATE SODIUM 8.6-50 MG PO TABS: 2.0000 | ORAL_TABLET | Freq: Every day | ORAL | Status: DC

## 2021-09-13 MED ORDER — OSMOLITE 1.5 CAL PO LIQD
720.0000 mL | ORAL | Status: DC
  Administered 2021-09-13 – 2021-09-14 (×3): 720 mL
  Filled 2021-09-13: qty 948
  Filled 2021-09-13 (×3): qty 1000

## 2021-09-13 MED ORDER — POTASSIUM CHLORIDE 20 MEQ PO PACK
20.0000 meq | PACK | ORAL | Status: AC
  Administered 2021-09-13 (×2): 20 meq
  Filled 2021-09-13 (×2): qty 1

## 2021-09-13 MED ORDER — SODIUM CHLORIDE 0.9 % IV SOLN
510.0000 mg | Freq: Once | INTRAVENOUS | Status: AC
  Administered 2021-09-13: 510 mg via INTRAVENOUS
  Filled 2021-09-13: qty 17

## 2021-09-13 NOTE — Progress Notes (Signed)
Nutrition Follow-up  DOCUMENTATION CODES:   Not applicable  INTERVENTION:    PO:  Dysphagia 2 with nectar thickened liquids Ensure Enlive po BID, each supplement provides 350 kcal and 20 grams of protein - thickened as appropriate.   Tube feeding via Cortrak tube: Change to nocturnal TF Osmolite 1.5 at 60 ml/h  x 12 hours (720 ml per day) Prosource TF 45 ml TID  Provides 1200 kcal, 78 gm protein, 547 ml free water daily   NUTRITION DIAGNOSIS:   Inadequate oral intake related to dysphagia as evidenced by  (altered texture diet). Ongoing.   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    Pt discussed during ICU rounds and with RN. Sister at bedside. Pt more awake today and can tell me her name. She is hungry and wants to eat/drink. She specifically asks for coke and for pineapple juice.  Per RN pt wants to wait until dinner to eat a meal.   2/20 cortrak placed; tip gastric  2/21 initiate TF 2/13 SLP advances diet to D2/T, change to nocturnal TF    Medications reviewed and include: nutrisource fiber, SSI, protonix  Labs reviewed: Na 156, K: 3.1 CBG's: 148-178   Diet Order:   Diet Order             DIET DYS 2 Room service appropriate? Yes; Fluid consistency: Nectar Thick  Diet effective now                   EDUCATION NEEDS:   Not appropriate for education at this time  Skin:  Skin Assessment: Reviewed RN Assessment  Last BM:  unknown  Height:   Ht Readings from Last 1 Encounters:  09/10/21 4\' 11"  (1.499 m)    Weight:   Wt Readings from Last 1  Encounters:  09/13/21 62.4 kg    BMI:  Body mass index is 27.79 kg/m.  Estimated Nutritional Needs:   Kcal:  1600-1800  Protein:  80-95 grams  Fluid:  > 1.5 L/day  Lockie Pares., RD, LDN, CNSC See AMiON for contact information

## 2021-09-13 NOTE — Progress Notes (Signed)
   Inpatient Rehab Admissions Coordinator :  Per therapy recommendations patient was screened for CIR candidacy by Charlann Wayne RN MSN. Patient is not yet at a level to tolerate the intensity required to pursue a CIR admit . Patient may have the potential to progress to become a candidate. The CIR admissions team will follow and monitor for progress and place a Rehab Consult order if felt to be appropriate. Please contact me with any questions.  Lailyn Appelbaum RN MSN Admissions Coordinator [phone]  

## 2021-09-13 NOTE — Progress Notes (Signed)
Physical Therapy Treatment Patient Details Name: April Reilly MRN: [id number] DOB: [birth year] Today's Date: 09/13/2021   History of Present Illness 51 y.o. female presents to [facility] hospital on 09/10/2021 after fall out of bed followed by AMS. CTH with significant edema in R frontal, parietal, occipital and temporal lobe with superimposed hemorrhage, started on hypertonic saline solution. PMH includes sickle cell trait, anemia.    PT Comments    Pt is more alert this session, opening eyes more frequently and much more verbally responsive. Pt has very poor awareness of deficits, reporting she walked to the bathroom earlier in the day despite remaining without active motion of left side. Pt requires significant physical assistance to stand and transfer, and demonstrates no awareness of left lateral lean in sitting. Pt will benefit from continued acute PT services, mirror therapy for re-orientation to midline may be beneficial moving forward.   Recommendations for follow up therapy are one component of a multi-disciplinary discharge planning process, led by the attending physician.  Recommendations may be updated based on patient status, additional functional criteria and insurance authorization.  Follow Up Recommendations  Acute inpatient rehab (3hours/day)     Assistance Recommended at Discharge Frequent or constant Supervision/Assistance  Patient can return home with the following Two people to help with walking and/or transfers;Two people to help with bathing/dressing/bathroom;Assistance with cooking/housework;Assistance with feeding;Direct supervision/assist for medications management;Direct supervision/assist for financial management;Assist for transportation;Help with stairs or ramp for entrance   Equipment Recommendations  Wheelchair (measurements PT);Wheelchair cushion (measurements PT);Hospital bed (hoyer lift)    Recommendations for Other Services       Precautions / Restrictions  Precautions Precautions: Fall Precaution Comments: cortrak Restrictions Weight Bearing Restrictions: No     Mobility  Bed Mobility Overal bed mobility: Needs Assistance Bed Mobility: Supine to Sit, Sit to Supine     Supine to sit: Max assist, HOB elevated Sit to supine: Total assist        Transfers Overall transfer level: Needs assistance Equipment used: 1 person hand held assist Transfers: Sit to/from Stand, Bed to chair/wheelchair/BSC Sit to Stand: Max assist Stand pivot transfers: Max assist         General transfer comment: pt requires initiation of stand but is able to provide support through RLE, utilizing RUE to aide in balance. PT provides RLE knee block to improve safety. Pt with LLE flexor tone, with L foot often off ground, buckling with attempts to weight shift to left    Ambulation/Gait                   Stairs             Wheelchair Mobility    Modified Rankin (Stroke Patients Only) Modified Rankin (Stroke Patients Only) Pre-Morbid Rankin Score: No symptoms Modified Rankin: Severe disability     Balance Overall balance assessment: Needs assistance Sitting-balance support: Single extremity supported, Feet unsupported Sitting balance-Leahy Scale: Poor Sitting balance - Comments: maxA-total, pt with poor perception of midline, leaning strongly to left side, initially utilizing RUE to aide in maintaining balance but progressing to pushing to left with RUE Postural control: Left lateral lean Standing balance support: Single extremity supported Standing balance-Leahy Scale: Zero Standing balance comment: max-total                            Cognition Arousal/Alertness: Lethargic (awakens with stimuli and mobility) Behavior During Therapy: Impulsive Overall Cognitive Status: Impaired/Different from baseline Area of Impairment:  Orientation, Attention, Memory, Following commands, Safety/judgement, Awareness, Problem solving                  Orientation Level: Disoriented to, Situation, Time Current Attention Level: Focused Memory: Decreased short-term memory, Decreased recall of precautions Following Commands: Follows one step commands with increased time Safety/Judgement: Decreased awareness of safety, Decreased awareness of deficits Awareness: Intellectual Problem Solving: Slow processing, Decreased initiation, Difficulty sequencing, Requires verbal cues          Exercises      General Comments General comments (skin integrity, edema, etc.): VSS on RA      Pertinent Vitals/Pain Pain Assessment Pain Assessment: No/denies pain    Home Living                          Prior Function            PT Goals (current goals can now be found in the care plan section) Acute Rehab PT Goals Patient Stated Goal: to improve reduce caregiver burden Progress towards PT goals: Progressing toward goals    Frequency    Min 4X/week      PT Plan Current plan remains appropriate    Co-evaluation              AM-PAC PT "6 Clicks" Mobility   Outcome Measure  Help needed turning from your back to your side while in a flat bed without using bedrails?: A Lot Help needed moving from lying on your back to sitting on the side of a flat bed without using bedrails?: A Lot Help needed moving to and from a bed to a chair (including a wheelchair)?: A Lot Help needed standing up from a chair using your arms (e.g., wheelchair or bedside chair)?: A Lot Help needed to walk in hospital room?: Total Help needed climbing 3-5 steps with a railing? : Total 6 Click Score: 10    End of Session   Activity Tolerance: Patient tolerated treatment well Patient left: in bed;with call bell/phone within reach;with bed alarm set Nurse Communication: Mobility status;Need for lift equipment PT Visit Diagnosis: Other abnormalities of gait and mobility (R26.89);Muscle weakness (generalized) (M62.81);Hemiplegia  and hemiparesis;Other symptoms and signs involving the nervous system (R29.898) Hemiplegia - Right/Left: Left Hemiplegia - caused by: Cerebral infarction (w/ hemorrhagic conversion)     Time: 9150-5697 PT Time Calculation (min) (ACUTE ONLY): 41 min  Charges:  $Therapeutic Activity: 38-52 mins                     Zenaida Niece, PT, DPT Acute Rehabilitation Pager: [phone] Office [place] Libr[place] Castronovo 09/13/2021, 4:15 PM

## 2021-09-13 NOTE — Progress Notes (Signed)
[facility] ADULT ICU REPLACEMENT PROTOCOL   The patient does apply for the [facility] Adult ICU Electrolyte Replacment Protocol based on the criteria listed below:   1.Exclusion criteria: TCTS patients, ECMO patients, and Dialysis patients 2. Is GFR >/= 30 ml/min? Yes.    Patient's GFR today is >60 3. Is SCr </= 2? Yes.   Patient's SCr is 0.68 mg/dL 4. Did SCr increase >/= 0.5 in 24 hours? No. 5.Pt's weight >40kg  Yes.   6. Abnormal electrolyte(s):   K 3.1    7. Electrolytes replaced per protocol 8.  Call MD STAT for K+ </= 2.5, Phos </= 1, or Mag </= 1 Physician:  S. Rosie Fate R Keoni Risinger 09/13/2021 5:50 AM

## 2021-09-13 NOTE — Progress Notes (Signed)
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  No Known Allergies  Patient Measurements: Height: 4\' 11"  (149.9 cm) Weight: 62.4 kg (137 lb 9.1 oz) IBW/kg (Calculated) : 43.2 Heparin Dosing Weight: 55kg  Vital Signs: Temp: 98.2 F (36.8 C) (02/23 0400) Temp Source: Oral (02/23 0400) BP: 143/85 (02/23 0400) Pulse Rate: 99 (02/23 0400)  Labs: Recent Labs    09/10/21 1437 09/10/21 1627 09/11/21 0419 09/11/21 6812 09/11/21 1633 09/12/21 0056 09/12/21 0507 09/12/21 0946 09/13/21 0432 09/13/21 0433  HGB  --   --  8.0*  --   --   --  7.4*  --   --  7.7*  HCT  --   --  26.6*  --   --   --  26.5*  --   --  27.8*  PLT  --   --  325  --   --   --  318  --   --  340  APTT 30  --   --   --   --   --   --   --   --   --   LABPROT 15.5*  --   --   --   --   --   --   --   --   --   INR 1.2  --   --   --   --   --   --   --   --   --   HEPARINUNFRC  --   --   --    < > <0.10* 0.30  --  0.40 0.42  --   CREATININE  --   --  0.75  --  0.73  --  0.69  --   --  0.68  CKTOTAL  --  230  --   --   --   --   --   --   --   --   CKMB  --  1.6  --   --   --   --   --   --   --   --    < > = values in this interval not displayed.     Estimated Creatinine Clearance: 67.6 mL/min (by C-G formula based on SCr of 0.68 mg/dL).   Assessment: 51yo female presents to ED w/ AMS and decreased LOC and CT reveals large right hemisphere stroke with mass effect and hemorrhagic conversion.  CT venogram shows cerebral venous sinus thromosis and patient was started on IV heparin.  D/w neuro Dr. Rory Percy to confirm anticoagulation in this pt with concurrent bleed; will dose and adjust cautiously with close monitoring.  Heparin level this AM continues to be therapeutic with low end goals on 850 units/hr, H/H low but stable, last head CT 2/22 with unchanged hemorrhage.    Goal of Therapy:  Heparin level  0.3-0.5 units/ml Monitor platelets by anticoagulation protocol: Yes   Plan:  Continue heparin gtt at 850 units/hr Daily heparin level, CBC, s/s bleeding  Bertis Ruddy, PharmD Clinical Pharmacist ED Pharmacist Phone # [phone] 09/13/2021 7:05 AM

## 2021-09-13 NOTE — Progress Notes (Signed)
STROKE TEAM PROGRESS NOTE   INTERVAL HISTORY Her sister and RN are at the bedside.  Patient mental status much improved. She is mildly lethargic but able to open eyes and asking for food and water. She prefers pineapple juice and Ginger ale. Speech less dysarthric today but still has left hemiparesis. On heparin IV, no fever. BP under control but still has mild tachycardia.    Vitals:   09/13/21 0500 09/13/21 0700 09/13/21 0800 09/13/21 0900  BP:   (!) 127/101 (!) 140/99  Pulse:  (!) 117 (!) 107 (!) 117  Resp:  (!) 25 (!) 24 18  Temp:   (!) 97.5 F (36.4 C)   TempSrc:   Oral   SpO2:  96% 98% 92%  Weight: 62.4 kg     Height:       CBC:  Recent Labs  Lab 09/10/21 0847 09/11/21 0419 09/12/21 0507 09/13/21 0433  WBC 12.9*   < > 7.3 8.0  NEUTROABS 11.2*  --   --   --   HGB 9.1*   < > 7.4* 7.7*  HCT 32.0*   < > 26.5* 27.8*  MCV 66.0*   < > 66.6* 66.7*  PLT 402*   < > 318 340   < > = values in this interval not displayed.   Basic Metabolic Panel:  Recent Labs  Lab 09/12/21 0507 09/12/21 0946 09/12/21 1748 09/12/21 2215 09/13/21 0433 09/13/21 1027  NA 161*   < > 157*   < > 157* 156*  K 3.3*  --   --   --  3.1*  --   CL >130*  --   --   --  124*  --   CO2 20*  --   --   --  21*  --   GLUCOSE 112*  --   --   --  152*  --   BUN 9  --   --   --  11  --   CREATININE 0.69  --   --   --  0.68  --   CALCIUM 8.7*  --   --   --  9.0  --   MG 2.4  --  2.2  --   --   --   PHOS 3.2  --  3.9  --   --   --    < > = values in this interval not displayed.   Lipid Panel:  Recent Labs  Lab 09/11/21 0422  CHOL 139  TRIG 47  HDL 46  CHOLHDL 3.0  VLDL 9  LDLCALC 84   HgbA1c:  Recent Labs  Lab 09/11/21 2009  HGBA1C 5.3   Urine Drug Screen:  Recent Labs  Lab 09/10/21 1313  LABOPIA NONE DETECTED  COCAINSCRNUR NONE DETECTED  LABBENZ NONE DETECTED  AMPHETMU NONE DETECTED  THCU NONE DETECTED  LABBARB NONE DETECTED    Alcohol Level No results for input(s): ETH in the last  168 hours.  IMAGING past 24 hours No results found.  PHYSICAL EXAM  Temp:  [97.5 F (36.4 C)-99.9 F (37.7 C)] 97.5 F (36.4 C) (02/23 0800) Pulse Rate:  [74-130] 117 (02/23 0900) Resp:  [17-29] 18 (02/23 0900) BP: (94-167)/(64-151) 140/99 (02/23 0900) SpO2:  [92 %-98 %] 92 % (02/23 0900) Weight:  [62.4 kg] 62.4 kg (02/23 0500)  General - Well nourished, well developed, lethargic.  Ophthalmologic - fundi not visualized due to noncooperation.  Cardiovascular - Regular rhythm and mild tachycardia.  Neuro - awake,  alert, eyes open on vice, able to keep eyes open but lethargic, orientated to age, place and people, but not to time. No aphasia, able to see simple sentences, following all simple commands, however moderate dysarthria. Able to name and repeat. Right gaze preference but able to cross midline, visual field not cooperative on exam, pupil equal bilaterally today. Mild left facial droop. Tongue protrusion not cooperative. RUE and RLE at least 4/5, LUE 2+/5 and LLE 3-/5. Sensation symmetrical bilaterally subjectively, right FTN intact grossly, gait not tested.     ASSESSMENT/PLAN Ms. Jarah Pember is a 51 y.o. female with history of sickle cell,  presenting with altered mental status, decreased LOC, flu like symptoms and possible unwitnessed fall out of bed. CT shows  large region of cortical subcortical edema within the right frontal parietal occipital and temporal lobes as well as right insula and subinsular region with patchy foci of acute parenchymal hemorrhage in the same areas. Concern for an acute, early subacute ischemia versus thrombosis of the venous system. 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 Heparin stroke protocol initiated. Hypertonic saline for cerebral edema. CT scan 2/21 showed progressive increase in midline shift from 55mm to 70mm, unchanged hemorrhage in right cerebral hemisphere. Repeat CT scan  ordered for 2/22. Neurosurgery recommends medical management with hypertonic saline as opposed to crani due to heparin infusion.  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 Midline shift increased from 9 to 11 mm. CTA head & neck- Multifocal narrowing in the right-greater-than-left A1, right M1, and proximal right M2 segments, likely reflecting vasospasm. Narrowing in the right petrous and cavernous ICA may also be related to known intracranial hemorrhage. Hyperdensity and IPH in the right cerebral hemisphere, concerning for infarct with hemorrhagic conversion, with 11 mm of right-to-left midline shift, and effacement of the basal cisterns. 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 MRI- Large area of cortical infarction in the right temporal, occipital, parietal, and posterior frontal lobes, with significant associated edema and redemonstrated intraparenchymal hemorrhage. The mass effect causes 11 mm of right-to-left midline shift, medial displacement of the right uncus and right temporal horn and effacement of the basal cisterns CT repeat in a.m. stable cerebral edema [place] Lower extremity duplex no DVT 2D Echo EF 60-65%, normal LV function LDL 84 HgbA1c 5.3 VTE prophylaxis - IV Heparin No antithrombotic prior to admission, now on heparin IV.  Therapy recommendations:  Pending Disposition:  Pending  Cerebral edema- concern for uncal herniation Repeat CT scan midline shift increase from 23mm -> 62mm Repeat CT 2/22 midline shift at 9 mm Hypertonic saline 47ml/hr -> NS->free water 200 Q4 Na goal 150-155 Na- 146-> 153->157->161->158->156  Concern for seizure activity EEG-  evidence of epileptogenicity and cortical dysfunction, no definite seizures seen throughout the recording, high potential for seizure recurrence. LTM EEG 2/21 evidence of epileptogenicity and cortical dysfunction arising from right hemisphere, maximal right parieto-occipital region likely due to underlying hemorrhage LTM EEG 2/22 evidence of epileptogenicity and cortical dysfunction arising from right hemisphere, maximal right parieto-occipital region likely due to underlying hemorrhage. This EEG pattern is on the ictal-interictal continuum with higher potential for seizure recurrence. Additionally there is moderate diffuse encephalopathy, nonspecific etiology. Phenytoin  100mg  q8hr followed by load Dilantin level 18.3-> 18.1->18.6 LTM d/c'ed by CCM Seizure precautions  Hypertension Stable BP goal less than 160 Long-term BP goal normotensive  Hyperlipidemia Home meds:  None LDL 84, goal < 70 Put on Lipitor 40 Continue statin on discharge  Diabetes type II Controlled Home meds:  None HgbA1c pending, goal < 7.0 CBGs  SSI  Dysphagia Speech on board Cortrak in place Tube feeding initiated- Osmolite 1.5 @ 11ml/hr Pending swallow re-eval  Febrile Tmax 101.6->100.6->afebrile Trend WBC- 12.9-> 8.4->7.3->8.0 Central fever?  PRN tylenol given Off rocephin, azithromycine and acyclovir  Aspiration pneumonia CCM on board Procal 0.11 Repeat CXR- interval development of right lower lobe airspace opacity. Family describes 3 days of URI symptoms. CCM discontinued azithromycin and ceftriaxone Two doses of acyclovir given, low suspicion- d/c'd  Hospital day # 3  Rosalin Hawking, MD PhD Stroke Neurology 09/13/2021 11:50 AM  This patient is critically ill due to large venous infarct with hemorrhagic transformation, severe cerebral edema, fever, encephalopathy, seizure, dysphagia and at significant risk of neurological worsening, death form brain herniation, hydrocephalus, stroke, status  epilepticus, sepsis. This patient's care requires constant monitoring of vital signs, hemodynamics, respiratory and cardiac monitoring, review of multiple databases, neurological assessment, discussion with family, other specialists and medical decision making of high complexity. I spent 35 minutes of neurocritical care time in the care of this patient. I had long discussion with sister at bedside, updated pt current condition, treatment plan and potential prognosis, and answered all the questions.  She expressed understanding and appreciation.     To contact Stroke Continuity provider, please refer to [email or web address] After hours, contact General Neurology

## 2021-09-13 NOTE — Progress Notes (Signed)
[facility] Progress Note Patient Name: April Reilly DOB: 19-Apr-1971 MRN: [id number]   Date of Service  09/13/2021  HPI/Events of Note  Hypernatremia - Na+ = 157. Goal Na+ = 150-155.  eICU Interventions  Will allow Na+ to correct spontaneously.      Intervention Category Major Interventions: Electrolyte abnormality - evaluation and management  Richards Pherigo Cornelia Copa 09/13/2021, 5:53 AM

## 2021-09-13 NOTE — Progress Notes (Signed)
NAME:  April Reilly, MRN:  [id number], DOB:  May 07, 1971, LOS: 3 ADMISSION DATE:  09/10/2021, CONSULTATION DATE:  2/20 REFERRING MD:  Stroke team, CHIEF COMPLAINT:  concern for airway protection   History of Present Illness:  51 year old female, presented to ER via EMS 2/20 w/ altered mental status following 3 days of URI symptoms. Family also reporting more sleepy and difficult to arouse over the 3 days as well. On arrival pt grunting to interactions. Moving right side only, left hemiparesis. CT and exam c/w large right frontoparietal infarct w/ hemorrhagic conversion and cerebral edema  Was seen by neuro-surg who recommended supportive care including HT saline. PCCM asked to assist w/ care.   Pertinent  Medical History  Sickle cell trait  Heavy vaginal bleeding.  Anemia   Significant Hospital Events: Including procedures, antibiotic start and stop dates in addition to other pertinent events   2/20: admitted after being found obtunded and 3d of URI symptoms. CT head -> significant (R) frontal, parietal, occipital and temporal lobe cerebral edema w/ superimposed hemorrhage. Admitted by neuro w/ working dx right frontoparietal infarct w/ hemorrhagic conversion and cerebral edema. Sen by neuro-surg. Recommended HT saline infusion but not emergent decompressive crani. PCCM asked to assist w/ care.  2/21 Venogram showed venous sinus thrombosis, heparin started 2/22 discontinued hypertonic saline  Interim History / Subjective:  HR into 120-130s overnight.  Patient assessed at bedside this AM. She is more alert, but still seems confused at times. Opens eyelids spontaneously.  Objective   Blood pressure (!) 143/85, pulse 99, temperature 98.2 F (36.8 C), temperature source Oral, resp. rate (!) 24, height 4\' 11"  (1.499 m), weight 62.4 kg, SpO2 97 %.        Intake/Output Summary (Last 24 hours) at 09/13/2021 0753 Last data filed at 09/13/2021 0400 Gross per 24 hour  Intake 1048.08 ml  Output  775 ml  Net 273.08 ml    Filed Weights   09/11/21 0500 09/12/21 0500 09/13/21 0500  Weight: 62.7 kg 60.4 kg 62.4 kg    Examination: General appearance: Able to follow commands and move right upper extremity and lower extremities bilaterally, oriented x3 Eyes: anicteric sclerae, moist conjunctivae HENT: NCAT  Neck: Trachea midline Lungs: CTAB, no crackles, no wheeze, with normal respiratory effort and no intercostal retractions CV: RRR, S1, S2, no MRGs  Abdomen: Soft, non-tender; non-distended, BS present  Extremities: No peripheral edema, radial and DP pulses present bilaterally  Skin: Normal temperature, turgor and texture; no rash Neuro: pupils equal and reactive to light  Na 160-> 157 K 3.1, repleted Cl 124 CO2 21 Glucose 130-160s Hgb 7.4-> 7.7 Iron 17, saturation 5% Ferritin 20  Resolved Hospital Problem list   Lactic acidosis Leukocytosis Viral pneumonia  Assessment & Plan:  Acute Right frontoparietal ischemic infarction with hemorrhagic conversion Cerebral edema with associated brain compression Mass effect with right to left midline shift  Na trending down to 157. -Serial neuro checks q 4 hrs -repeat Na in 6 hours. Stopping hypertonic saline and allowing sodium to re-equilibrate slowly. No need for free water at this time. -consider adding free water 2/24 if Na greater than 155. -SBP goal 130-150   Venous sinus thrombosis CT venogram with right transverse sinus and sigmoid sinus dural venous sinus thrombosis. Echo showed no evidence of thrombus, EF 60-65%. - Continue Heparin per pharmacy - will transition to PO once closer to transfer out of ICU  Iron deficiency anemia Iron deficit of 1144 mg.  - trend CBC -  if hgb <7.0 transfuse - consider iron supplementation once more stable  Dysphagia Cortrak in place. Tube feeds started. She has been tolerating tube feeds. -speech eval  Diarrhea  Patient with several episodes of liquid stool yesterday. FMS in  place. - Fiber packet  Hyperglycemia A1c 5.3, BG controlled 120-180s - SSI q 4 hours  Tachycardia HR 120-130s overnight - Continue Lopressor 25 mg BID  Best Practice (right click and "Reselect all SmartList Selections" daily)   Diet/type: NPO, cortrak in place DVT prophylaxis: systemic heparin GI prophylaxis: PPI Lines: yes and it is still needed PICC line Foley:  Yes, and it is still needed Code Status:  full code Last date of multidisciplinary goals of care discussion [updated sister and patient at bedside 2/23]  Lilit Cinelli M. Raygen Dahm, D.O.  Internal Medicine Resident, PGY-1 [facility]  Pager: [phone] 7:53 AM, 09/13/2021

## 2021-09-13 NOTE — Evaluation (Signed)
Clinical/Bedside Swallow Evaluation Patient Details  Name: April Reilly MRN: [id number] Date of Birth: 1970-09-18  Today's Date: 09/13/2021 Time: SLP Start Time (ACUTE ONLY): 1100 SLP Stop Time (ACUTE ONLY): 1125 SLP Time Calculation (min) (ACUTE ONLY): 25 min  Past Medical History:  Past Medical History:  Diagnosis Date   Asthma    Past Surgical History:  Past Surgical History:  Procedure Laterality Date   FOOT SURGERY     TUBAL LIGATION     HPI:  51 year old female presenting with large right frontoparietal ischemic infarction with hemorrhagic conversion, mass effect with right to left midline shift and initial stages of midbrain compression.CT shows  large region of cortical subcortical edema within the right frontal parietal occipital and temporal lobes as well as right insula and subinsular region with patchy foci of acute parenchymal hemorrhage in the same areas. HAs required 3% saline, was never intubated.    Assessment / Plan / Recommendation  Clinical Impression  Pt demonstrates swallowing impairment related to left CN VII weakness as well as impaired attention, occasional lethargy and also impulsivity following CVA. Pt will drink consecutively and rapidly if given a beverage of choice, but has anterior spillage on the left with a cup and poor oral ccordination of sipping and swallowing with a straw. There was immediate and delayed coughing with most attempts with thin liquids. Pt was able to consume consecutive straw sips of nectar thick liquids without signs of aspiration. Pt additionally had impulsive intake with solids with oral holding and also some left oral residue post swallow. Pt is recommended to initiate a dys 2 (finely chopped) diet and nectar thick liquids with meds whole in puree. Will f/u for tolerance; pt may need instrumental assessment. SLP Visit Diagnosis: Dysphagia, oropharyngeal phase (R13.12)    Aspiration Risk  Moderate aspiration risk    Diet  Recommendation Dysphagia 2 (Fine chop);Nectar-thick liquid   Liquid Administration via: Straw;Cup Medication Administration: Whole meds with puree Supervision: Staff to assist with self feeding;Full supervision/cueing for compensatory strategies Compensations: Minimize environmental distractions;Monitor for anterior loss Postural Changes: Seated upright at 90 degrees    Other  Recommendations Oral Care Recommendations: Oral care BID Other Recommendations: Order thickener from pharmacy;Have oral suction available    Recommendations for follow up therapy are one component of a multi-disciplinary discharge planning process, led by the attending physician.  Recommendations may be updated based on patient status, additional functional criteria and insurance authorization.  Follow up Recommendations Acute inpatient rehab (3hours/day)      Assistance Recommended at Discharge    Functional Status Assessment Patient has had a recent decline in their functional status and demonstrates the ability to make significant improvements in function in a reasonable and predictable amount of time.  Frequency and Duration min 2x/week  2 weeks       Prognosis        Swallow Study   General HPI: 51 year old female presenting with large right frontoparietal ischemic infarction with hemorrhagic conversion, mass effect with right to left midline shift and initial stages of midbrain compression.CT shows  large region of cortical subcortical edema within the right frontal parietal occipital and temporal lobes as well as right insula and subinsular region with patchy foci of acute parenchymal hemorrhage in the same areas. HAs required 3% saline, was never intubated. Type of Study: Bedside Swallow Evaluation Diet Prior to this Study: NPO Temperature Spikes Noted: No Respiratory Status: Room air History of Recent Intubation: No Behavior/Cognition: Cooperative;Requires cueing;Distractible Oral Cavity  Assessment:  Dry Oral Care Completed by SLP: Yes Oral Cavity - Dentition: Adequate natural dentition Vision: Impaired for self-feeding Self-Feeding Abilities: Needs assist Patient Positioning: Upright in bed Baseline Vocal Quality: Normal Volitional Cough: Strong Volitional Swallow: Able to elicit    Oral/Motor/Sensory Function Overall Oral Motor/Sensory Function: Mild impairment Facial ROM: Reduced left;Suspected CN VII (facial) dysfunction Facial Symmetry: Abnormal symmetry left;Suspected CN VII (facial) dysfunction Facial Strength: Reduced left;Suspected CN VII (facial) dysfunction Facial Sensation: Within Functional Limits Lingual ROM: Within Functional Limits Lingual Symmetry: Within Functional Limits Lingual Strength: Reduced Lingual Sensation: Within Functional Limits   Ice Chips Ice chips: Within functional limits   Thin Liquid Thin Liquid: Impaired Presentation: Straw;Cup Oral Phase Impairments: Reduced labial seal Oral Phase Functional Implications: Left anterior spillage;Oral holding Pharyngeal  Phase Impairments: Cough - Immediate    Nectar Thick Nectar Thick Liquid: Within functional limits Presentation: Straw;Self Fed   Honey Thick Honey Thick Liquid: Not tested   Puree Puree: Impaired Presentation: Spoon Oral Phase Impairments: Reduced labial seal Oral Phase Functional Implications: Oral residue;Right anterior spillage;Left anterior spillage   Solid     Solid: Impaired Presentation: Self Fed Oral Phase Impairments: Impaired mastication;Reduced lingual movement/coordination Oral Phase Functional Implications: Oral residue;Prolonged oral transit      Author [place], Katherene Ponto 09/13/2021,12:38 PM

## 2021-09-13 NOTE — Progress Notes (Signed)
[facility] Progress Note Patient Name: April Reilly DOB: 1970-11-19 MRN: [id number]   Date of Service  09/13/2021  HPI/Events of Note  RN reporting pt is anxious, restless, grabbing at tubes.  They are placing mitts on pt, want to avoid restraints.  Asking for order for anxiolytic.  Discussed with RN.  S/p CVA.  130/89. HR sinus tachy. Not in pain. On TF. Has a pure wick. Not suspecting any retension.   eICU Interventions  Avoid benzos, anxiolytics for now. Calming down, HR 114.  Watch for now     Intervention Category Minor Interventions: Communication with other healthcare providers and/or family;Agitation / anxiety - evaluation and management  Elmer Sow 09/13/2021, 8:44 PM

## 2021-09-14 ENCOUNTER — Inpatient Hospital Stay (HOSPITAL_COMMUNITY): Payer: 59

## 2021-09-14 DIAGNOSIS — G08 Intracranial and intraspinal phlebitis and thrombophlebitis: Secondary | ICD-10-CM | POA: Diagnosis not present

## 2021-09-14 DIAGNOSIS — N939 Abnormal uterine and vaginal bleeding, unspecified: Secondary | ICD-10-CM

## 2021-09-14 DIAGNOSIS — G936 Cerebral edema: Secondary | ICD-10-CM | POA: Diagnosis not present

## 2021-09-14 LAB — CBC
HCT: 28.8 % — ABNORMAL LOW (ref 36.0–46.0)
Hemoglobin: 8.2 g/dL — ABNORMAL LOW (ref 12.0–15.0)
MCH: 19.2 pg — ABNORMAL LOW (ref 26.0–34.0)
MCHC: 28.5 g/dL — ABNORMAL LOW (ref 30.0–36.0)
MCV: 67.4 fL — ABNORMAL LOW (ref 80.0–100.0)
Platelets: 294 10*3/uL (ref 150–400)
RBC: 4.27 MIL/uL (ref 3.87–5.11)
RDW: 21.1 % — ABNORMAL HIGH (ref 11.5–15.5)
WBC: 9.8 10*3/uL (ref 4.0–10.5)
nRBC: 1.2 % — ABNORMAL HIGH (ref 0.0–0.2)

## 2021-09-14 LAB — BASIC METABOLIC PANEL
Anion gap: 7 (ref 5–15)
BUN: 13 mg/dL (ref 6–20)
CO2: 23 mmol/L (ref 22–32)
Calcium: 9.4 mg/dL (ref 8.9–10.3)
Chloride: 123 mmol/L — ABNORMAL HIGH (ref 98–111)
Creatinine, Ser: 0.65 mg/dL (ref 0.44–1.00)
GFR, Estimated: >60 mL/min (ref >60)
Glucose, Bld: 125 mg/dL — ABNORMAL HIGH (ref 70–99)
Potassium: 3.8 mmol/L (ref 3.5–5.1)
Sodium: 153 mmol/L — ABNORMAL HIGH (ref 135–145)

## 2021-09-14 LAB — GLUCOSE, CAPILLARY
Glucose-Capillary: 153 mg/dL — ABNORMAL HIGH (ref 70–99)
Glucose-Capillary: 99 mg/dL (ref 70–99)
Glucose-Capillary: 117 mg/dL — ABNORMAL HIGH (ref 70–99)
Glucose-Capillary: 122 mg/dL — ABNORMAL HIGH (ref 70–99)
Glucose-Capillary: 158 mg/dL — ABNORMAL HIGH (ref 70–99)

## 2021-09-14 LAB — SODIUM
Sodium: 149 mmol/L — ABNORMAL HIGH (ref 135–145)
Sodium: 149 mmol/L — ABNORMAL HIGH (ref 135–145)
Sodium: 149 mmol/L — ABNORMAL HIGH (ref 135–145)

## 2021-09-14 LAB — HEPARIN LEVEL (UNFRACTIONATED)
Heparin Unfractionated: 0.56 IU/mL (ref 0.30–0.70)
Heparin Unfractionated: 0.28 IU/mL — ABNORMAL LOW (ref 0.30–0.70)

## 2021-09-14 LAB — T3: T3, Total: 78 ng/dL (ref 71–180)

## 2021-09-14 LAB — PHENYTOIN LEVEL, TOTAL: Phenytoin Lvl: 17.2 ug/mL (ref 10.0–20.0)

## 2021-09-14 MED ORDER — DIPHENHYDRAMINE HCL 25 MG PO CAPS
25.0000 mg | ORAL_CAPSULE | Freq: Once | ORAL | Status: DC
  Filled 2021-09-14: qty 1

## 2021-09-14 MED ORDER — MEGESTROL ACETATE 40 MG PO TABS
40.0000 mg | ORAL_TABLET | Freq: Two times a day (BID) | ORAL | Status: DC
  Administered 2021-09-14 – 2021-09-15 (×3): 40 mg
  Filled 2021-09-14 (×4): qty 1

## 2021-09-14 NOTE — Progress Notes (Addendum)
STROKE TEAM PROGRESS NOTE   INTERVAL HISTORY Her sister and RN are at the bedside.  Patient mental status much improved. She is conservative today. Up in the chair. Able to identify family members. Called gyn attending for recommendations regarding resuming megestrol related to uterine fibroids and bleeding. They recommend resuming home dose   Vitals:   09/14/21 0500 09/14/21 0600 09/14/21 0700 09/14/21 0800  BP: 132/86 121/81 119/79 (!) 123/98  Pulse: (!) 112 98 98 (!) 104  Resp: 19 (!) 24 (!) 27 (!) 25  Temp:    98.1 F (36.7 C)  TempSrc:    Axillary  SpO2: 97% 100% 97% 98%  Weight: 62.4 kg     Height:       CBC:  Recent Labs  Lab 09/10/21 0847 09/11/21 0419 09/13/21 0433 09/14/21 0444  WBC 12.9*   < > 8.0 9.8  NEUTROABS 11.2*  --   --   --   HGB 9.1*   < > 7.7* 8.2*  HCT 32.0*   < > 27.8* 28.8*  MCV 66.0*   < > 66.7* 67.4*  PLT 402*   < > 340 294   < > = values in this interval not displayed.    Basic Metabolic Panel:  Recent Labs  Lab 09/12/21 0507 09/12/21 0946 09/12/21 1748 09/12/21 2215 09/13/21 0433 09/13/21 1027 09/13/21 2117 09/14/21 0444  NA 161*   < > 157*   < > 157*   < > 153* 153*  K 3.3*  --   --   --  3.1*  --   --  3.8  CL >130*  --   --   --  124*  --   --  123*  CO2 20*  --   --   --  21*  --   --  23  GLUCOSE 112*  --   --   --  152*  --   --  125*  BUN 9  --   --   --  11  --   --  13  CREATININE 0.69  --   --   --  0.68  --   --  0.65  CALCIUM 8.7*  --   --   --  9.0  --   --  9.4  MG 2.4  --  2.2  --   --   --   --   --   PHOS 3.2  --  3.9  --   --   --   --   --    < > = values in this interval not displayed.    Lipid Panel:  Recent Labs  Lab 09/11/21 0422  CHOL 139  TRIG 47  HDL 46  CHOLHDL 3.0  VLDL 9  LDLCALC 84    HgbA1c:  Recent Labs  Lab 09/11/21 2009  HGBA1C 5.3    Urine Drug Screen:  Recent Labs  Lab 09/10/21 1313  LABOPIA NONE DETECTED  COCAINSCRNUR NONE DETECTED  LABBENZ NONE DETECTED  AMPHETMU NONE  DETECTED  THCU NONE DETECTED  LABBARB NONE DETECTED     Alcohol Level No results for input(s): ETH in the last 168 hours.  IMAGING past 24 hours No results found.  PHYSICAL EXAM  Temp:  [98.1 F (36.7 C)-99.7 F (37.6 C)] 98.1 F (36.7 C) (02/24 0800) Pulse Rate:  [73-122] 104 (02/24 0800) Resp:  [19-33] 25 (02/24 0800) BP: (117-167)/(79-114) 123/98 (02/24 0800) SpO2:  [93 %-100 %] 98 % (  02/24 0800) Weight:  [62.4 kg] 62.4 kg (02/24 0500)  General - Well nourished, well developed, lethargic.  Ophthalmologic - fundi not visualized due to noncooperation.  Cardiovascular - Regular rhythm and rate.  Neuro - awake, alert, likes having eyes closed, but able to open when requested. Orientated to age, place, person, and time.   No aphasia, able to see simple sentences, following all simple commands, however moderate dysarthria. Able to name and repeat. Right gaze preference but able to cross midline, visual field not cooperative on exam, pupil equal bilaterally today. Mild left facial droop. Tongue protrusion not cooperative. RUE and RLE at least 4/5, LUE 2+/5 and LLE 3-/5. Sensation symmetrical bilaterally subjectively, right FTN intact grossly, gait not tested.     ASSESSMENT/PLAN [name] is a 51 y.o. female with history of sickle cell,  presenting with altered mental status, decreased LOC, flu like symptoms and possible unwitnessed fall out of bed. CT shows  large region of cortical subcortical edema within the right frontal parietal occipital and temporal lobes as well as right insula and subinsular region with patchy foci of acute parenchymal hemorrhage in the same areas. Concern for an acute, early subacute ischemia versus thrombosis of the venous system. 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 Heparin stroke protocol initiated. Hypertonic saline for cerebral edema. CT scan 2/21 showed progressive increase in  midline shift from 5mm to 15mm, unchanged hemorrhage in right cerebral hemisphere. Repeat CT scan ordered for 2/22. Neurosurgery recommends medical management with hypertonic saline as opposed to crani due to heparin infusion. Gyn recommends resuming megestrol 40mg  BID for uterine fibroids.   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 Midline shift increased from 9 to 11 mm. CTA head & neck- Multifocal narrowing in the right-greater-than-left A1, right M1, and proximal right M2 segments, likely reflecting vasospasm. Narrowing in the right petrous and cavernous ICA may also be related to known intracranial hemorrhage. Hyperdensity and IPH in the right cerebral hemisphere, concerning for infarct with hemorrhagic conversion, with 11 mm of right-to-left midline shift, and effacement of the basal cisterns. 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 MRI- Large area of cortical infarction in the right temporal, occipital, parietal, and posterior frontal lobes, with significant associated edema and redemonstrated intraparenchymal hemorrhage. The mass effect causes 11 mm of right-to-left midline shift, medial displacement of the right uncus and right temporal horn and effacement of the basal cisterns CT repeat in a.m. stable cerebral edema [place] Lower extremity duplex no DVT 2D Echo EF 60-65%, normal LV function LDL 84 HgbA1c 5.3 VTE prophylaxis - IV Heparin No antithrombotic prior to admission, now on heparin IV.  Therapy recommendations:  CIR Disposition:  Pending  Cerebral edema- concern for uncal herniation Repeat CT scan midline shift increase from 59mm -> 61mm Repeat CT 2/22  midline shift at 9 mm Repeat CT 2/24 pending Hypertonic saline 61ml/hr -> NS->FW 200 Q4->TF Na goal 150-155 Na- 146-> 153->157->161->158->156->153->149  Concern for seizure activity EEG- evidence of epileptogenicity and cortical dysfunction, no definite seizures seen throughout the recording, high potential for seizure recurrence. LTM EEG 2/21 evidence of epileptogenicity and cortical dysfunction arising from right hemisphere, maximal right parieto-occipital region likely  due to underlying hemorrhage LTM EEG 2/22 evidence of epileptogenicity and cortical dysfunction arising from right hemisphere, maximal right parieto-occipital region likely due to underlying hemorrhage. This EEG pattern is on the ictal-interictal continuum with higher potential for seizure recurrence. Additionally there is moderate diffuse encephalopathy, nonspecific etiology. Phenytoin 100mg  q8hr followed by load Dilantin level 18.3-> 18.1->18.6->17.2 LTM d/c'ed by CCM Seizure precautions  Hypertension Stable BP goal less than 160 Long-term BP goal normotensive  Hyperlipidemia Home meds:  None LDL 84, goal < 70 Put on Lipitor 40 Continue statin on discharge  Dysphagia Speech on board Cortrak in place Tube feeding initiated- Osmolite 1.5 @ 66ml/hr Passed swallow on dys2 and nectar thick liquid. once pt able to consume >50% of every meal, will d/c cortrak and TF.   Febrile, resolved Tmax 101.6->100.6->afebrile Trend WBC- 12.9-> 8.4->7.3->8.0-> 9.8 PRN tylenol given Off rocephin, azithromycine and acyclovir  Aspiration pneumonia CCM on board Procal 0.11 Repeat CXR- interval development of right lower lobe airspace opacity. Family describes 3 days of URI symptoms. CCM discontinued azithromycin and ceftriaxone Two doses of acyclovir given, low suspicion- d/c'd  Vaginal bleeding Per RN, pt in menstrual period Pt does have hx of uterus fibroids, on megace PTA Now on heparin IV also, curbside consulted  OBGYN, [place] to resume [facility] day # 4  Patient seen and examined by NP/APP with MD. MD to update note as needed.   Janine Ores, DNP, FNP-BC [facility] Pager: [phone]  ATTENDING NOTE: I reviewed above note and agree with the assessment and plan. Pt was seen and examined.   Patient sitting in chair, still lethargic, hardly open eyes, however, she was awake alert, introduced to me to her daughter and granddaughters at bedside.  She was able to engage in normal conversation but still has mild dysarthria.  Pupil equal size, right INO, with left incomplete INO.  Left facial droop, still has left hemiparesis more on the arm than the leg. PT/OT recommend CIR.  Patient had mild vaginal bleeding today, per RN, patient is in menstrual period, but pt does have hx of fibroids on megace. We curbside consulted with OBGYN on call, [place] to continue megace. There is low likelihood of megace to be associated with CVST.   Continue heparin IV for now, as well as dilantin. Dilantin level stable. Passed swallow on nectar sick liquid, but also on TF, once pt able to consume >50% of every meal, will d/c cortrak and TF. CT repeat pending.   For detailed assessment and plan, please refer to above as I have made changes wherever appropriate.   Rosalin Hawking, MD PhD Stroke Neurology 09/14/2021 4:12 PM  This patient is critically ill due to cerebral venous sinus thrombosis, cerebral edema, seizure, dysphagia and at significant risk of neurological worsening, death form brain herniation, status epilepticus, sepsis, aspiration pneumonia. This patient's care requires constant monitoring of vital signs, hemodynamics, respiratory and cardiac monitoring, review of multiple databases, neurological assessment, discussion with family, other specialists and medical decision making of high complexity. I spent 35 minutes of neurocritical care time in the care of this patient. I had long discussion with  daughter at bedside, updated pt current condition, treatment plan and potential prognosis, and answered all the questions.  She expressed understanding and appreciation.  I also discussed with CCM Dr. Lynetta Mare      To contact Stroke Continuity provider, please refer to [email or web address] After hours, contact General Neurology

## 2021-09-14 NOTE — Progress Notes (Signed)
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  No Known Allergies  Patient Measurements: Height: 4\' 11"  (149.9 cm) Weight: 62.4 kg (137 lb 9.1 oz) IBW/kg (Calculated) : 43.2 Heparin Dosing Weight: 55kg  Vital Signs: Temp: 98.8 F (37.1 C) (02/24 1200) Temp Source: Oral (02/24 1200) BP: 103/72 (02/24 1400) Pulse Rate: 81 (02/24 1400)  Labs: Recent Labs    09/12/21 0507 09/12/21 0946 09/13/21 0432 09/13/21 0433 09/14/21 0444 09/14/21 1249  HGB 7.4*  --   --  7.7* 8.2*  --   HCT 26.5*  --   --  27.8* 28.8*  --   PLT 318  --   --  340 294  --   HEPARINUNFRC  --    < > 0.42  --  0.56 0.28*  CREATININE 0.69  --   --  0.68 0.65  --    < > = values in this interval not displayed.     Estimated Creatinine Clearance: 67.6 mL/min (by C-G formula based on SCr of 0.65 mg/dL).   Assessment: 51yo female presents to ED w/ AMS and decreased LOC and CT reveals large right hemisphere stroke with mass effect and hemorrhagic conversion.  CT venogram shows cerebral venous sinus thromosis and patient was started on IV heparin.  D/w neuro Dr. Rory Percy to confirm anticoagulation in this pt with concurrent bleed; will dose and adjust cautiously with close monitoring.  Heparin level this AM drifted up to slightly supratherapeutic and rate reduced slightly to 800 units/hr, now slightly SUBtherapeutic.  Has been stable on 850 units/hr, clinically improved with last two CT scans stable  Goal of Therapy:  Heparin level 0.3-0.5 units/ml Monitor platelets by anticoagulation protocol: Yes   Plan:  Adjust heparin gtt back to 850 units/hr Daily heparin level, CBC, s/s bleeding  Bertis Ruddy, PharmD Clinical Pharmacist ED Pharmacist Phone # [phone] 09/14/2021 2:06 PM

## 2021-09-14 NOTE — TOC CAGE-AID Note (Deleted)
Transition of Care [facility]) - CAGE-AID Screening   Patient Details  Name: April Reilly MRN: [id number] Date of Birth: [birth year]  Transition of Care [facility]) CM/SW Contact:    Maitlyn Penza C Tarpley-Carter, [place] Phone Number: 09/14/2021, 2:53 PM   Clinical Narrative: Pt is unable to participate in Cage Aid. Pt could not be aroused.  Pt is still having speaking difficulty.  Please, attempt Cage-Aid over weekend.  Chalise Pe Tarpley-Carter, MSW, LCSW-A Pronouns:  She/Her/Hers [place] Transitions of Care Clinical Social Worker Direct Number:  [phone] [email or web address]   CAGE-AID Screening: Substance Abuse Screening unable to be completed due to: : Patient unable to participate             Substance Abuse Education Offered: Yes  Substance abuse interventions: Scientist, clinical (histocompatibility and immunogenetics)

## 2021-09-14 NOTE — Progress Notes (Signed)
Occupational Therapy Treatment Patient Details Name: Leyani Gargus MRN: [id number] DOB: Nov 14, 1970 Today's Date: 09/14/2021   History of present illness 51 y.o. female presents to [facility] on 09/10/2021 after fall out of bed followed by AMS. CTH with significant edema in R frontal, parietal, occipital and temporal lobe with superimposed hemorrhage, started on hypertonic saline solution. PMH includes sickle cell trait, anemia.   OT comments  Patient up in the recliner lethargic and asleep.  Continues to keep eyes closed unless prompted to open her eyes.  OT was able to move left upper extremity through PROM.  Bulk of the session involved leaning forward to promote trunk mobility and midline unsupported sitting.  Patient needing up to Mod A to maintain balance.  Head rotation and looking right encouraged, but patient very lethargic and unable to maintain her head in neutral.  Patient repositioned for comfort and to promote midline sitting.  OT to continue efforts, and AIR continues to recommended for post acute rehab.     Recommendations for follow up therapy are one component of a multi-disciplinary discharge planning process, led by the attending physician.  Recommendations may be updated based on patient status, additional functional criteria and insurance authorization.    Follow Up Recommendations  Acute inpatient rehab (3hours/day)    Assistance Recommended at Discharge Frequent or constant Supervision/Assistance  Patient can return home with the following  Two people to help with walking and/or transfers;Two people to help with bathing/dressing/bathroom;Assistance with feeding;Assistance with cooking/housework;Direct supervision/assist for medications management;Direct supervision/assist for financial management;Assist for transportation;Help with stairs or ramp for entrance   Equipment Recommendations  BSC/3in1;Tub/shower bench;Wheelchair (measurements OT);Wheelchair cushion (measurements  OT);Hospital bed    Recommendations for Other Services Rehab consult    Precautions / Restrictions Precautions Precautions: Fall Precaution Comments: cortrak Restrictions Weight Bearing Restrictions: No       Mobility Bed Mobility                    Transfers                         Balance Overall balance assessment: Needs assistance Sitting-balance support: Feet supported, Single extremity supported Sitting balance-Leahy Scale: Poor   Postural control: Left lateral lean                                 ADL either performed or assessed with clinical judgement   ADL                                              Extremity/Trunk Assessment Upper Extremity Assessment Upper Extremity Assessment: LUE deficits/detail LUE Deficits / Details: No AROM noted this date, patient very lethargic.  Minor flexor tone noted with PROM to bicep. LUE Sensation: decreased light touch LUE Coordination: decreased fine motor;decreased gross motor   Lower Extremity Assessment Lower Extremity Assessment: Defer to PT evaluation   Cervical / Trunk Assessment Cervical / Trunk Exceptions: pt with head in left lateral flexion and left rotation. Positioned with pillow and towel to maintain a little more in midline    Vision       Perception     Praxis      Cognition Arousal/Alertness: Lethargic Behavior During Therapy: Flat affect Overall Cognitive Status: Impaired/Different from baseline  Exercises General Exercises - Upper Extremity Shoulder Flexion: PROM, Seated, 10 reps, Left Shoulder Extension: PROM, Seated, Left, 10 reps Shoulder ABduction: PROM, Seated, Left, 10 reps Shoulder ADduction: PROM, Seated, Left, 10 reps Elbow Flexion: PROM, Seated, Left, 10 reps Elbow Extension: PROM, Seated, Left, 10 reps Wrist Flexion: PROM, Seated, Left, 10 reps Wrist Extension: PROM,  Seated, 10 reps, Left Digit Composite Flexion: PROM, Seated, Left, 10 reps Composite Extension: PROM, Seated, 10 reps, Left    Shoulder Instructions       General Comments      Pertinent Vitals/ Pain       Pain Assessment Pain Assessment: Faces Faces Pain Scale: No hurt Pain Intervention(s): Monitored during session                                                          Frequency  Min 2X/week        Progress Toward Goals  OT Goals(current goals can now be found in the care plan section)     Acute Rehab OT Goals OT Goal Formulation: Patient unable to participate in goal setting Time For Goal Achievement: 09/26/21 Potential to Achieve Goals: Good  Plan      Co-evaluation                 AM-PAC OT "6 Clicks" Daily Activity     Outcome Measure   Help from another person eating meals?: Total Help from another person taking care of personal grooming?: A Lot Help from another person toileting, which includes using toliet, bedpan, or urinal?: Total Help from another person bathing (including washing, rinsing, drying)?: Total Help from another person to put on and taking off regular upper body clothing?: Total Help from another person to put on and taking off regular lower body clothing?: Total 6 Click Score: 7    End of Session    OT Visit Diagnosis: Unsteadiness on feet (R26.81);Other abnormalities of gait and mobility (R26.89);Muscle weakness (generalized) (M62.81);Low vision, both eyes (H54.2);Apraxia (R48.2);Other symptoms and signs involving the nervous system (R29.898);Other symptoms and signs involving cognitive function;Hemiplegia and hemiparesis Hemiplegia - Right/Left: Left Hemiplegia - dominant/non-dominant: Non-Dominant Hemiplegia - caused by: Cerebral infarction;Nontraumatic intracerebral hemorrhage   Activity Tolerance Patient limited by lethargy;Patient limited by fatigue   Patient Left in chair;with call bell/phone  within reach;with chair alarm set   Nurse Communication          Time: [phone] OT Time Calculation (min): 16 min  Charges: OT General Charges $OT Visit: 1 Visit OT Treatments $Therapeutic Activity: 8-22 mins  09/14/2021  RP, OTR/L  Acute Rehabilitation Services  Office:  [phone]   Metta Clines 09/14/2021, 11:42 AM

## 2021-09-14 NOTE — Progress Notes (Signed)
Physical Therapy Treatment Patient Details Name: April Reilly MRN: [id number] DOB: 12-27-1970 Today's Date: 09/14/2021   History of Present Illness 51 y.o. female presents to [facility] hospital on 09/10/2021 after fall out of bed followed by AMS. CTH with significant edema in R frontal, parietal, occipital and temporal lobe with superimposed hemorrhage, started on hypertonic saline solution. PMH includes sickle cell trait, anemia.    PT Comments    Pt tolerates multiple transfers during today's session, continuing to require significant assistance due to L hemiplegia and neglect. Pt  will benefit from continued aggressive mobilization in an effort to improve balance and reduce caregiver burden. PT provides education to family about left neglect, encouraging more stimulation on left side of the patient. PT continues to recommend AIR admission.  Recommendations for follow up therapy are one component of a multi-disciplinary discharge planning process, led by the attending physician.  Recommendations may be updated based on patient status, additional functional criteria and insurance authorization.  Follow Up Recommendations  Acute inpatient rehab (3hours/day)     Assistance Recommended at Discharge Frequent or constant Supervision/Assistance  Patient can return home with the following Two people to help with walking and/or transfers;Two people to help with bathing/dressing/bathroom;Assistance with cooking/housework;Assistance with feeding;Direct supervision/assist for medications management;Direct supervision/assist for financial management;Assist for transportation;Help with stairs or ramp for entrance   Equipment Recommendations  Wheelchair (measurements PT);Wheelchair cushion (measurements PT);Hospital bed (hoyer lift)    Recommendations for Other Services       Precautions / Restrictions Precautions Precautions: Fall Precaution Comments: cortrak Restrictions Weight Bearing Restrictions: No      Mobility  Bed Mobility Overal bed mobility: Needs Assistance Bed Mobility: Sit to Supine       Sit to supine: Total assist        Transfers Overall transfer level: Needs assistance Equipment used: 1 person hand held assist Transfers: Sit to/from Stand, Bed to chair/wheelchair/BSC Sit to Stand: Max assist Stand pivot transfers: Max assist         General transfer comment: pt transfers from recliner to bedside commode, BSC to recliner, and then recliner to bed. Increased assistance requirements with each subsequent transfer due to fatigue and increased lethargy    Ambulation/Gait                   Stairs             Wheelchair Mobility    Modified Rankin (Stroke Patients Only) Modified Rankin (Stroke Patients Only) Pre-Morbid Rankin Score: No symptoms Modified Rankin: Severe disability     Balance Overall balance assessment: Needs assistance Sitting-balance support: No upper extremity supported, Feet supported, Single extremity supported Sitting balance-Leahy Scale: Poor Sitting balance - Comments: maxA, left lateral lean pt unable to correct for Postural control: Left lateral lean Standing balance support: Single extremity supported Standing balance-Leahy Scale: Zero Standing balance comment: max-totalA, R knee block provided                            Cognition Arousal/Alertness: Awake/alert, Lethargic (awake initially but becomes lethargic with mobility) Behavior During Therapy: WFL for tasks assessed/performed Overall Cognitive Status: Impaired/Different from baseline Area of Impairment: Safety/judgement, Awareness, Problem solving                         Safety/Judgement: Decreased awareness of safety, Decreased awareness of deficits Awareness: Intellectual Problem Solving: Slow processing, Decreased initiation, Requires verbal cues, Requires tactile cues  Exercises      General Comments General  comments (skin integrity, edema, etc.): VSS on RA      Pertinent Vitals/Pain Pain Assessment Pain Assessment: Faces Faces Pain Scale: Hurts little more Pain Location: head Pain Descriptors / Indicators: Headache Pain Intervention(s): Monitored during session    Home Living                          Prior Function            PT Goals (current goals can now be found in the care plan section) Acute Rehab PT Goals Patient Stated Goal: to improve reduce caregiver burden Progress towards PT goals: Progressing toward goals    Frequency    Min 4X/week      PT Plan Current plan remains appropriate    Co-evaluation              AM-PAC PT "6 Clicks" Mobility   Outcome Measure  Help needed turning from your back to your side while in a flat bed without using bedrails?: A Lot Help needed moving from lying on your back to sitting on the side of a flat bed without using bedrails?: A Lot Help needed moving to and from a bed to a chair (including a wheelchair)?: A Lot Help needed standing up from a chair using your arms (e.g., wheelchair or bedside chair)?: A Lot Help needed to walk in hospital room?: Total Help needed climbing 3-5 steps with a railing? 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

## 2021-09-14 NOTE — TOC CAGE-AID Note (Signed)
Transition of Care [facility]) - CAGE-AID Screening   Patient Details  Name: Tia Hieronymus MRN: [id number] Date of Birth: 08-08-1970  Transition of Care [facility]) CM/SW Contact:    [place] Sherburn C Tarpley-Carter, [place] Phone Number: 09/14/2021, 3:04 PM   Clinical Narrative: Pt is unable to participate in Cage Aid. Pt could not be aroused.  Pt is still having speaking difficulty.  Please, attempt Cage-Aid over weekend.  Shamonica Schadt Tarpley-Carter, MSW, LCSW-A Pronouns:  She/Her/Hers [place] Transitions of Care Clinical Social Worker Direct Number:  [phone] [email or web address]    CAGE-AID Screening: Substance Abuse Screening unable to be completed due to: : Patient unable to participate             Substance Abuse Education Offered: No

## 2021-09-14 NOTE — Progress Notes (Signed)
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  No Known Allergies  Patient Measurements: Height: 4\' 11"  (149.9 cm) Weight: 62.4 kg (137 lb 9.1 oz) IBW/kg (Calculated) : 43.2 Heparin Dosing Weight: 55kg  Vital Signs: Temp: 99.7 F (37.6 C) (02/24 0400) Temp Source: Oral (02/24 0400) BP: 121/81 (02/24 0600) Pulse Rate: 98 (02/24 0600)  Labs: Recent Labs    09/12/21 0507 09/12/21 0946 09/13/21 0432 09/13/21 0433 09/14/21 0444  HGB 7.4*  --   --  7.7* 8.2*  HCT 26.5*  --   --  27.8* 28.8*  PLT 318  --   --  340 294  HEPARINUNFRC  --  0.40 0.42  --  0.56  CREATININE 0.69  --   --  0.68 0.65     Estimated Creatinine Clearance: 67.6 mL/min (by C-G formula based on SCr of 0.65 mg/dL).   Assessment: 51yo female presents to ED w/ AMS and decreased LOC and CT reveals large right hemisphere stroke with mass effect and hemorrhagic conversion.  CT venogram shows cerebral venous sinus thromosis and patient was started on IV heparin.  D/w neuro Dr. Rory Percy to confirm anticoagulation in this pt with concurrent bleed; will dose and adjust cautiously with close monitoring.  Heparin level this AM drifted up to slightly supratherapeutic for low end goals on 850 units/hr   Goal of Therapy:  Heparin level 0.3-0.5 units/ml Monitor platelets by anticoagulation protocol: Yes   Plan:  Decrease heparin gtt to 800 units/hr F/u 6 hour heparin level to confirm Daily heparin level, CBC, s/s bleeding  Bertis Ruddy, PharmD Clinical Pharmacist ED Pharmacist Phone # [phone] 09/14/2021 7:09 AM

## 2021-09-15 DIAGNOSIS — I639 Cerebral infarction, unspecified: Secondary | ICD-10-CM | POA: Diagnosis not present

## 2021-09-15 LAB — CBC
HCT: 26.5 % — ABNORMAL LOW (ref 36.0–46.0)
Hemoglobin: 7.2 g/dL — ABNORMAL LOW (ref 12.0–15.0)
MCH: 18.7 pg — ABNORMAL LOW (ref 26.0–34.0)
MCHC: 27.2 g/dL — ABNORMAL LOW (ref 30.0–36.0)
MCV: 68.8 fL — ABNORMAL LOW (ref 80.0–100.0)
Platelets: 272 10*3/uL (ref 150–400)
RBC: 3.85 MIL/uL — ABNORMAL LOW (ref 3.87–5.11)
RDW: 21.2 % — ABNORMAL HIGH (ref 11.5–15.5)
WBC: 9.2 10*3/uL (ref 4.0–10.5)
nRBC: 3.8 % — ABNORMAL HIGH (ref 0.0–0.2)

## 2021-09-15 LAB — GLUCOSE, CAPILLARY
Glucose-Capillary: 101 mg/dL — ABNORMAL HIGH (ref 70–99)
Glucose-Capillary: 111 mg/dL — ABNORMAL HIGH (ref 70–99)
Glucose-Capillary: 132 mg/dL — ABNORMAL HIGH (ref 70–99)
Glucose-Capillary: 158 mg/dL — ABNORMAL HIGH (ref 70–99)

## 2021-09-15 LAB — HEPARIN LEVEL (UNFRACTIONATED)
Heparin Unfractionated: 0.49 IU/mL (ref 0.30–0.70)
Heparin Unfractionated: 0.59 IU/mL (ref 0.30–0.70)
Heparin Unfractionated: 0.54 IU/mL (ref 0.30–0.70)

## 2021-09-15 LAB — CULTURE, BLOOD (ROUTINE X 2)
Culture: NO GROWTH
Culture: NO GROWTH

## 2021-09-15 LAB — PHENYTOIN LEVEL, TOTAL: Phenytoin Lvl: 15 ug/mL (ref 10.0–20.0)

## 2021-09-15 LAB — BASIC METABOLIC PANEL
Anion gap: 9 (ref 5–15)
BUN: 15 mg/dL (ref 6–20)
CO2: 23 mmol/L (ref 22–32)
Calcium: 8.8 mg/dL — ABNORMAL LOW (ref 8.9–10.3)
Chloride: 115 mmol/L — ABNORMAL HIGH (ref 98–111)
Creatinine, Ser: 0.72 mg/dL (ref 0.44–1.00)
GFR, Estimated: >60 mL/min (ref >60)
Glucose, Bld: 161 mg/dL — ABNORMAL HIGH (ref 70–99)
Potassium: 3.7 mmol/L (ref 3.5–5.1)
Sodium: 147 mmol/L — ABNORMAL HIGH (ref 135–145)

## 2021-09-15 LAB — ANTINUCLEAR ANTIBODIES, IFA: ANA Ab, IFA: NEGATIVE

## 2021-09-15 LAB — SODIUM: Sodium: 146 mmol/L — ABNORMAL HIGH (ref 135–145)

## 2021-09-15 MED ORDER — POTASSIUM CHLORIDE 20 MEQ PO PACK
40.0000 meq | PACK | Freq: Once | ORAL | Status: AC
  Administered 2021-09-15: 40 meq
  Filled 2021-09-15: qty 2

## 2021-09-15 MED ORDER — NUTRISOURCE FIBER PO PACK
1.0000 | PACK | Freq: Two times a day (BID) | ORAL | Status: DC
  Administered 2021-09-15 – 2021-09-17 (×4): 1 via ORAL
  Filled 2021-09-15 (×7): qty 1

## 2021-09-15 MED ORDER — SENNOSIDES-DOCUSATE SODIUM 8.6-50 MG PO TABS
2.0000 | ORAL_TABLET | Freq: Every day | ORAL | Status: DC
  Administered 2021-09-15: 2 via ORAL
  Filled 2021-09-15 (×3): qty 2

## 2021-09-15 MED ORDER — TRAMADOL HCL 50 MG PO TABS
25.0000 mg | ORAL_TABLET | Freq: Four times a day (QID) | ORAL | Status: DC | PRN
  Administered 2021-09-15 – 2021-09-19 (×7): 25 mg via ORAL
  Filled 2021-09-15 (×7): qty 1

## 2021-09-15 MED ORDER — MEGESTROL ACETATE 40 MG PO TABS
40.0000 mg | ORAL_TABLET | Freq: Two times a day (BID) | ORAL | Status: DC
  Administered 2021-09-15 – 2021-09-19 (×4): 40 mg via ORAL
  Filled 2021-09-15 (×9): qty 1

## 2021-09-15 MED ORDER — FENTANYL CITRATE PF 50 MCG/ML IJ SOSY
25.0000 ug | PREFILLED_SYRINGE | Freq: Once | INTRAMUSCULAR | Status: AC
  Administered 2021-09-15: 25 ug via INTRAVENOUS
  Filled 2021-09-15: qty 1

## 2021-09-15 MED ORDER — ATORVASTATIN CALCIUM 40 MG PO TABS
40.0000 mg | ORAL_TABLET | Freq: Every day | ORAL | Status: DC
  Administered 2021-09-16 – 2021-09-19 (×4): 40 mg via ORAL
  Filled 2021-09-15 (×4): qty 1

## 2021-09-15 MED ORDER — METOPROLOL TARTRATE 25 MG PO TABS
25.0000 mg | ORAL_TABLET | Freq: Two times a day (BID) | ORAL | Status: DC
  Administered 2021-09-15 – 2021-09-19 (×7): 25 mg via ORAL
  Filled 2021-09-15 (×8): qty 1

## 2021-09-15 MED ORDER — TRAMADOL 5 MG/ML ORAL SUSPENSION: 25.0000 mg | Freq: Four times a day (QID) | ORAL | Status: DC | PRN

## 2021-09-15 MED ORDER — BUTALBITAL-APAP-CAFFEINE 50-325-40 MG PO TABS
1.0000 | ORAL_TABLET | Freq: Four times a day (QID) | ORAL | Status: DC | PRN
  Administered 2021-09-15 – 2021-09-19 (×12): 1 via ORAL
  Filled 2021-09-15 (×13): qty 1

## 2021-09-15 MED ORDER — TRAMADOL HCL 50 MG PO TABS: 25.0000 mg | ORAL_TABLET | Freq: Four times a day (QID) | ORAL | Status: DC | PRN

## 2021-09-15 NOTE — Progress Notes (Signed)
SLP Cancellation Note  Patient Details Name: April Reilly MRN: [id number] DOB: 1971/04/14   Cancelled treatment:       Reason Eval/Treat Not Completed: Patient's level of consciousness. SLP attempted session yesterday and today, pt lethargic unfortunately at both times though she had been previously alert. Will try to return around lunch time if possible   Brendy Ficek, Katherene Ponto 09/15/2021, 11:05 AM

## 2021-09-15 NOTE — Progress Notes (Signed)
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  No Known Allergies  Patient Measurements: Height: 4\' 11"  (149.9 cm) Weight: 61.4 kg (135 lb 5.8 oz) IBW/kg (Calculated) : 43.2 Heparin Dosing Weight: 55kg  Vital Signs: Temp: 99 F (37.2 C) (02/25 1518) Temp Source: Oral (02/25 1518) BP: 112/76 (02/25 2003) Pulse Rate: 84 (02/25 2003)  Labs: Recent Labs    09/13/21 0433 09/14/21 0444 09/14/21 1249 09/15/21 0439 09/15/21 1215 09/15/21 2100  HGB 7.7* 8.2*  --  7.2*  --   --   HCT 27.8* 28.8*  --  26.5*  --   --   PLT 340 294  --  272  --   --   HEPARINUNFRC  --  0.56   < > 0.49 0.59 0.54  CREATININE 0.68 0.65  --  0.72  --   --    < > = values in this interval not displayed.     Estimated Creatinine Clearance: 67.1 mL/min (by C-G formula based on SCr of 0.72 mg/dL).   Assessment: 50yo female presents to ED w/ AMS and decreased LOC and CT reveals large right hemisphere stroke with mass effect and hemorrhagic conversion.  CT venogram shows cerebral venous sinus thromosis and patient was started on IV heparin.  D/w neuro Dr. Rory Percy to confirm anticoagulation in this pt with concurrent bleed; will dose and adjust cautiously with close monitoring.  Heparin level remains slightly supratherapeutic (0.54) on 820 units/hr (Epic rounds to nearest 50 units/hr but confirmed with RN that pump running at 820 units/hr/. RN noted that pt with small amount of blood in urine.   Goal of Therapy:  Heparin level 0.3-0.5 units/ml Monitor platelets by anticoagulation protocol: Yes   Plan:  Decrease to heparin to 750 units/hr  F/u 6 hr HL  F/u for any further blood in urine  Sherlon Handing, PharmD, BCPS Please see amion for complete clinical pharmacist phone list 09/15/2021 10:05 PM

## 2021-09-15 NOTE — Evaluation (Signed)
Speech Language Pathology Evaluation Patient Details Name: April Reilly MRN: [id number] DOB: June 11, 1971 Today's Date: 09/15/2021 Time: 1300-1330 SLP Time Calculation (min) (ACUTE ONLY): 30 min  Problem List:  Patient Active Problem List   Diagnosis Date Noted   Stroke (cerebrum) ([place]) 09/10/2021   Difficult intravenous access 09/10/2021   Nontraumatic cerebral edema ([place]) 17/40/8144   Acute metabolic encephalopathy 81/85/6314   ICH (intracerebral hemorrhage) ([place]) 09/10/2021   Lactic acidosis 09/10/2021   Fever 09/10/2021   Leukocytosis 09/10/2021   Past Medical History:  Past Medical History:  Diagnosis Date   Asthma    Past Surgical History:  Past Surgical History:  Procedure Laterality Date   FOOT SURGERY     TUBAL LIGATION     HPI:  51 year old female presenting with large right frontoparietal ischemic infarction with hemorrhagic conversion, mass effect with right to left midline shift and initial stages of midbrain compression.CT shows  large region of cortical subcortical edema within the right frontal parietal occipital and temporal lobes as well as right insula and subinsular region with patchy foci of acute parenchymal hemorrhage in the same areas. HAs required 3% saline, was never intubated.   Assessment / Plan / Recommendation Clinical Impression  Pt demonstrates impaired memory, attention and awareness with left neglect, all complicated by intermittent lethargy. Pt requires verbal cueing for orientation, reasoning, and multistep problem solving. She is internally distracted by pain and is mildly confabulatory. Hospital delirium is likely complicating pts cognitive function. Provided education to pt and sister with suggestions for cueing.  Recommend supervision and assist with basic functional tasks. Will benefit from SLP f/u at [facility].    SLP Assessment  SLP Recommendation/Assessment: Patient needs continued Speech Lanaguage Pathology Services SLP Visit Diagnosis:  Attention and concentration deficit Attention and concentration deficit following: Cerebral infarction    Recommendations for follow up therapy are one component of a multi-disciplinary discharge planning process, led by the attending physician.  Recommendations may be updated based on patient status, additional functional criteria and insurance authorization.    Follow Up Recommendations  Acute inpatient rehab (3hours/day)    Assistance Recommended at Discharge  Frequent or constant Supervision/Assistance  Functional Status Assessment    Frequency and Duration min 2x/week  2 weeks      SLP Evaluation Cognition  Overall Cognitive Status: Impaired/Different from baseline Arousal/Alertness: Lethargic Orientation Level: Oriented to person;Oriented to place;Disoriented to situation;Disoriented to time Attention: Focused;Sustained;Selective Focused Attention: Appears intact Sustained Attention: Appears intact Selective Attention: Impaired Selective Attention Impairment: Verbal basic;Functional basic Memory: Impaired Memory Impairment: Decreased long term memory;Storage deficit Decreased Long Term Memory: Verbal basic Awareness: Impaired Awareness Impairment: Intellectual impairment;Emergent impairment Problem Solving: Appears intact Executive Function: Self Monitoring;Self Correcting Safety/Judgment: Impaired       Comprehension  Auditory Comprehension Overall Auditory Comprehension: Appears within functional limits for tasks assessed    Expression Verbal Expression Overall Verbal Expression: Appears within functional limits for tasks assessed   Oral / Motor  Oral Motor/Sensory Function Overall Oral Motor/Sensory Function: Mild impairment Facial ROM: Reduced left;Suspected CN VII (facial) dysfunction Facial Symmetry: Abnormal symmetry left;Suspected CN VII (facial) dysfunction Facial Strength: Reduced left;Suspected CN VII (facial) dysfunction Facial Sensation: Within  Functional Limits Lingual ROM: Within Functional Limits Lingual Symmetry: Within Functional Limits Lingual Strength: Reduced Lingual Sensation: Within Functional Limits Velum: Within Functional Limits Mandible: Within Functional Limits Motor Speech Overall Motor Speech: Impaired Respiration: Within functional limits Phonation: Low vocal intensity Resonance: Hypernasality Articulation: Impaired Level of Impairment: Word Intelligibility: Intelligible Motor Planning: Witnin  functional limits            Emelio Schneller, Katherene Ponto 09/15/2021, 2:14 PM

## 2021-09-15 NOTE — Progress Notes (Addendum)
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  No Known Allergies  Patient Measurements: Height: 4\' 11"  (149.9 cm) Weight: 61.4 kg (135 lb 5.8 oz) IBW/kg (Calculated) : 43.2 Heparin Dosing Weight: 55kg  Vital Signs: Temp: 97.8 F (36.6 C) (02/25 1200) Temp Source: Oral (02/25 1200) BP: 114/80 (02/25 1200) Pulse Rate: 82 (02/25 1200)  Labs: Recent Labs    09/13/21 0433 09/14/21 0444 09/14/21 1249 09/15/21 0439 09/15/21 1215  HGB 7.7* 8.2*  --  7.2*  --   HCT 27.8* 28.8*  --  26.5*  --   PLT 340 294  --  272  --   HEPARINUNFRC  --  0.56 0.28* 0.49 0.59  CREATININE 0.68 0.65  --  0.72  --     Estimated Creatinine Clearance: 67.1 mL/min (by C-G formula based on SCr of 0.72 mg/dL).   Assessment: 51yo female presents to ED w/ AMS and decreased LOC and CT reveals large right hemisphere stroke with mass effect and hemorrhagic conversion.  CT venogram shows cerebral venous sinus thromosis and patient was started on IV heparin.  D/w neuro Dr. Rory Percy to confirm anticoagulation in this pt with concurrent bleed; will dose and adjust cautiously with close monitoring.  Heparin level 0.59 is supratherapeutic on 850 units/hr. H/H 7-8, plt stable. CT head is stable on current heparin. Keep goal 0.3-0.5 for now per discussion with Neuro today. Of note, patient was decreased to 800 units/hr on 2/24 for similar high heparin level that was slightly high on 850 units/hr. Level dropped to subtherapeutic 0.28 on 800 units/hr. Will give 820 units/hr (Confirmed with RN, can program 8.2 ml/hr in pump but Epic automatically rounds to nearest 50 unit/hr. Added clarification in admin instructions)  Goal of Therapy:  Heparin level 0.3-0.5 units/ml Monitor platelets by anticoagulation protocol: Yes   Plan:  Decrease to heparin 820 units/hr (note order says 800 units/hr d/t Epic rounding but added  admin instructions)  F/u 8 hr HL  Monitor daily heparin level, CBC Monitor for signs/symptoms of bleeding    Benetta Spar, PharmD, BCPS, BCCP Clinical Pharmacist  Please check AMION for all [place] phone numbers After 10:00 PM, call [place]

## 2021-09-15 NOTE — Progress Notes (Signed)
Speech Language Pathology Treatment: Dysphagia;Cognitive-Linquistic  Patient Details Name: April Reilly MRN: [id number] DOB: 04-Mar-1971 Today's Date: 09/15/2021 Time: 1300-1330 SLP Time Calculation (min) (ACUTE ONLY): 30 min  Assessment / Plan / Recommendation Clinical Impression  Pt seen with lunch meal. Pt able to consume thin liquids (over 8 oz) without signs of aspiration. Able to take pills whole with liquids. Pt continues to need assist with meals due to attention impairment, left CN VII impairment and lethargy. She needed cues to clear left buccal cavity. Liquid wash particularly helpful. Though pt continues to need dys 2 (finely chopped ) diet she can have soft foods like soups and cooked vegetables and starches from outside the hospital. Sister provided very good supports during meal for safety and attention. Will upgrade to thin liquids. Given very good intake today and consistent ability to take oral meds, would recommend removal of Cortrak since pt finds it uncomfortable. See next note for cognitive linguistic eval.   HPI HPI: 51 year old female presenting with large right frontoparietal ischemic infarction with hemorrhagic conversion, mass effect with right to left midline shift and initial stages of midbrain compression.CT shows  large region of cortical subcortical edema within the right frontal parietal occipital and temporal lobes as well as right insula and subinsular region with patchy foci of acute parenchymal hemorrhage in the same areas. HAs required 3% saline, was never intubated.      SLP Plan  Continue with current plan of care      Recommendations for follow up therapy are one component of a multi-disciplinary discharge planning process, led by the attending physician.  Recommendations may be updated based on patient status, additional functional criteria and insurance authorization.    Recommendations  Diet recommendations: Dysphagia 2 (fine chop);Thin liquid Liquids  provided via: Cup;Straw Medication Administration: Whole meds with liquid Supervision: Staff to assist with self feeding;Full supervision/cueing for compensatory strategies Compensations: Minimize environmental distractions;Monitor for anterior loss;Follow solids with liquid                General recommendations: Rehab consult Oral Care Recommendations: Oral care BID Follow Up Recommendations: Acute inpatient rehab (3hours/day) Assistance recommended at discharge: Frequent or constant Supervision/Assistance SLP Visit Diagnosis: Dysphagia, oropharyngeal phase (R13.12) Plan: Continue with current plan of care           Margot Oriordan, Katherene Ponto  09/15/2021, 1:59 PM

## 2021-09-15 NOTE — Progress Notes (Addendum)
STROKE TEAM PROGRESS NOTE   INTERVAL HISTORY Patient is sitting up in bed. She is trying to eat breakfast. Transfer out of ICU today.   Vitals:   09/15/21 0444 09/15/21 0500 09/15/21 0600 09/15/21 0800  BP:  117/71 107/62   Pulse:  89 80   Resp:  (!) 28 (!) 26   Temp:    98 F (36.7 C)  TempSrc:    Oral  SpO2:  98% 97%   Weight: 61.4 kg     Height:       CBC:  Recent Labs  Lab 09/10/21 0847 09/11/21 0419 09/14/21 0444 09/15/21 0439  WBC 12.9*   < > 9.8 9.2  NEUTROABS 11.2*  --   --   --   HGB 9.1*   < > 8.2* 7.2*  HCT 32.0*   < > 28.8* 26.5*  MCV 66.0*   < > 67.4* 68.8*  PLT 402*   < > 294 272   < > = values in this interval not displayed.    Basic Metabolic Panel:  Recent Labs  Lab 09/12/21 0507 09/12/21 0946 09/12/21 1748 09/12/21 2215 09/14/21 0444 09/14/21 1003 09/14/21 2319 09/15/21 0439  NA 161*   < > 157*   < > 153*   < > 149* 147*  K 3.3*  --   --    < > 3.8  --   --  3.7  CL >130*  --   --    < > 123*  --   --  115*  CO2 20*  --   --    < > 23  --   --  23  GLUCOSE 112*  --   --    < > 125*  --   --  161*  BUN 9  --   --    < > 13  --   --  15  CREATININE 0.69  --   --    < > 0.65  --   --  0.72  CALCIUM 8.7*  --   --    < > 9.4  --   --  8.8*  MG 2.4  --  2.2  --   --   --   --   --   PHOS 3.2  --  3.9  --   --   --   --   --    < > = values in this interval not displayed.    Lipid Panel:  Recent Labs  Lab 09/11/21 0422  CHOL 139  TRIG 47  HDL 46  CHOLHDL 3.0  VLDL 9  LDLCALC 84    HgbA1c:  Recent Labs  Lab 09/11/21 2009  HGBA1C 5.3    Urine Drug Screen:  Recent Labs  Lab 09/10/21 1313  LABOPIA NONE DETECTED  COCAINSCRNUR NONE DETECTED  LABBENZ NONE DETECTED  AMPHETMU NONE DETECTED  THCU NONE DETECTED  LABBARB NONE DETECTED     Alcohol Level No results for input(s): ETH in the last 168 hours.  IMAGING past 24 hours CT HEAD WO CONTRAST (5MM)  Result Date: 09/14/2021 CLINICAL DATA:  Stroke, follow up EXAM: CT HEAD  WITHOUT CONTRAST TECHNIQUE: Contiguous axial images were obtained from the base of the skull through the vertex without intravenous contrast. RADIATION DOSE REDUCTION: This exam was performed according to the departmental dose-optimization program which includes automated exposure control, adjustment of the mA and/or kV according to patient size and/or use of iterative reconstruction technique. COMPARISON:  09/12/2021 FINDINGS: Brain: Edema and patchy hemorrhage are again identified in the right parietal, temporal, and occipital lobes extending to involve deep white matter. Mass effect is similar including leftward midline shift (9 mm), diffuse sulcal effacement, and basal cistern effacement. Hemorrhage is again noted within the right occipital horn. Ventricle size is similar with possible minimal increase in size of the left lateral ventricle. No new loss of gray-white differentiation. Vascular: No new findings. Skull: Calvarium is unremarkable. Sinuses/Orbits: No acute finding. Other: None. IMPRESSION: Stable extent of infarction and hemorrhage. Associated edema, mass effect, and ventricle caliber similar. Electronically Signed   By: Macy Mis M.D.   On: 09/14/2021 16:33    PHYSICAL EXAM  Temp:  [98 F (36.7 C)-99.6 F (37.6 C)] 98 F (36.7 C) (02/25 0800) Pulse Rate:  [67-114] 80 (02/25 0600) Resp:  [17-28] 26 (02/25 0600) BP: (95-136)/(60-93) 107/62 (02/25 0600) SpO2:  [93 %-99 %] 97 % (02/25 0600) Weight:  [61.4 kg] 61.4 kg (02/25 0444)  General - Well nourished, well developed, lethargic.  Ophthalmologic - fundi not visualized due to noncooperation.  Cardiovascular - Regular rhythm and rate.  Neuro - awake, alert, likes having eyes closed, but able to open when requested. Orientated to age, place, person, and time.   No aphasia, able to see simple sentences, following all simple commands, however moderate dysarthria. Able to name and repeat. Right gaze preference but able to cross  midline, visual field not cooperative on exam, pupil equal bilaterally today. Mild left facial droop. Tongue protrusion not cooperative. RUE and RLE at least 4/5, LUE 2+/5 and LLE 3-/5. Sensation symmetrical bilaterally subjectively, right FTN intact grossly, gait not tested.     ASSESSMENT/PLAN [name] is a 51 y.o. female with history of sickle cell,  presenting with altered mental status, decreased LOC, flu like symptoms and possible unwitnessed fall out of bed. CT shows  large region of cortical subcortical edema within the right frontal parietal occipital and temporal lobes as well as right insula and subinsular region with patchy foci of acute parenchymal hemorrhage in the same areas. Concern for an acute, early subacute ischemia versus thrombosis of the venous system. 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 Heparin stroke protocol initiated. Hypertonic saline for cerebral edema. CT scan 2/21 showed progressive increase in midline shift from 12mm to 46mm, unchanged hemorrhage in right cerebral hemisphere. Repeat CT scan ordered for 2/22. Neurosurgery recommends medical management with hypertonic saline as opposed to crani due to heparin infusion. Gyn recommends resuming megestrol 40mg  BID for uterine fibroids.   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 Midline shift increased from 9 to 11 mm. 2/24- Repeat CT- midline shift 57mm, diffuse sulcal effacement, and basal cistern effacement. Minimal increase in size of left lateral ventricle.  CTA head & neck- Multifocal narrowing in the right-greater-than-left A1, right M1, and proximal right M2 segments, likely  reflecting vasospasm. Narrowing in the right petrous and cavernous ICA may also be related to known intracranial hemorrhage. Hyperdensity and IPH in the right cerebral hemisphere, concerning for infarct with hemorrhagic conversion, with 11 mm of right-to-left midline shift, and effacement of the basal cisterns. CT Venogram- Venous sinus thrombosis in the distal right transverse sinus and right  sigmoid sinus. MRI- Large area of cortical infarction in the right temporal, occipital, parietal, and posterior frontal lobes, with significant associated edema and redemonstrated intraparenchymal hemorrhage. The mass effect causes 11 mm of right-to-left midline shift, medial displacement of the right uncus and right temporal horn and effacement of the basal cisterns CT repeat in a.m. stable cerebral edema [place] Lower extremity duplex no DVT 2D Echo EF 60-65%, normal LV function LDL 84 HgbA1c 5.3 VTE prophylaxis - IV Heparin No antithrombotic prior to admission, now on heparin IV.  Therapy recommendations:  CIR Disposition:  Pending  Venous sinus thrombus CTV- venous sinus thrombus in the distal right transverse sinus and right sigmoid sinus IV heparin infusion management by pharmacy Goal 0.3-0.7 0.56->0.28-> 0.49  Cerebral edema- concern for uncal herniation Repeat CT scan midline shift increase from 10mm -> 77mm Repeat CT 2/22 midline shift at 9 mm Repeat CT 2/24 Stable with 71mm shift. Hypertonic saline 63ml/hr -> NS->FW 200 Q4->TF Na goal 150-155 Na- 146-> 153->157->161->158->156->153->149  Concern for seizure activity EEG- evidence of epileptogenicity and cortical dysfunction, no definite seizures seen throughout the recording, high potential for seizure recurrence. LTM EEG 2/21 evidence of epileptogenicity and cortical dysfunction arising from right hemisphere, maximal right parieto-occipital region likely due to underlying hemorrhage LTM EEG 2/22 evidence of epileptogenicity and cortical  dysfunction arising from right hemisphere, maximal right parieto-occipital region likely due to underlying hemorrhage. This EEG pattern is on the ictal-interictal continuum with higher potential for seizure recurrence. Additionally there is moderate diffuse encephalopathy, nonspecific etiology. Phenytoin 100mg  q8hr followed by load Dilantin level 18.3-> 18.1->18.6->17.2-> 15.0 LTM d/c'ed by CCM Seizure precautions  Hypertension Stable BP goal less than 160 Long-term BP goal normotensive  Hyperlipidemia Home meds:  None LDL 84, goal < 70 Con't Lipitor 40 Continue statin on discharge  Dysphagia Speech on board Cortrak in place Tube feeding initiated- Osmolite 1.5 @ 71ml/hr Passed swallow on dys2 and nectar thick liquid.  able to consume >50% of every meal, will d/c cortrak and TF.   Febrile, resolved Tmax 101.6->100.6->afebrile Trend WBC- 12.9-> 8.4->7.3->8.0-> 9.8 PRN tylenol given Off rocephin, azithromycine and acyclovir  Aspiration pneumonia CCM on board Procal 0.11 Repeat CXR- interval development of right lower lobe airspace opacity. Family describes 3 days of URI symptoms. CCM discontinued azithromycin and ceftriaxone Two doses of acyclovir given, low suspicion- d/c'd  Vaginal bleeding Per RN, pt in menstrual period Pt does have hx of uterus fibroids, on megace PTA Now on heparin IV also, curbside consulted OBGYN, [place] to resume [name] day # 5  Patient seen and examined by NP/APP with MD. MD to update note as needed.   April Ores, DNP, FNP-BC [facility] Pager: [phone]  ATTENDING ATTESTATION:  51 year old female with history of sickle cell presented with altered mental status and unwitnessed fall.  CT shows right frontoparietal intracranial hemorrhage with 9 mm shift.  CT venogram shows venous sinus thrombosis of the distal right transverse sinus and right sigmoid sinus.  She is on heparin drip and hypertonic saline for severe  edema.  Repeat CT yesterday stable.  Initially was concern for seizures LTM showed right hemispheric cortical dysfunction and epileptic focus on Dilantin with no further seizure episodes.  Right gaze preference goes to midline, left hemiparesis.  She is able to eat breakfast the right hand and was able to eat lunch.  Cortrak stopped.  Speech reevaluate the patient is now on a diet.  We will transfer out of the ICU and to  the hospitalist team  Dr. Reeves Forth evaluated pt independently, reviewed imaging, chart, labs. Discussed and formulated plan with the APP. Please see APP note above for details.      This patient is critically ill due to [place] with shift/edema and at significant risk of neurological worsening, death form heart failure, respiratory failure, recurrent stroke, bleeding from [facility], seizure, sepsis. This patient's care requires constant monitoring of vital signs, hemodynamics, respiratory and cardiac monitoring, review of multiple databases, neurological assessment, discussion with family, other specialists and medical decision making of high complexity. I spent 35 minutes of neurocritical care time in the care of this patient.   April Spaugh,MD    To contact Stroke Continuity provider, please refer to [email or web address] After hours, contact General Neurology

## 2021-09-15 NOTE — Progress Notes (Signed)
Called patient's daughter to provide updates and notification of patient transfer to [phone]. All questions answered.

## 2021-09-15 NOTE — Progress Notes (Signed)
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  No Known Allergies  Patient Measurements: Height: 4\' 11"  (149.9 cm) Weight: 61.4 kg (135 lb 5.8 oz) IBW/kg (Calculated) : 43.2 Heparin Dosing Weight: 55kg  Vital Signs: Temp: 98 F (36.7 C) (02/25 0000) Temp Source: Oral (02/25 0000) BP: 112/65 (02/25 0400) Pulse Rate: 90 (02/25 0400)  Labs: Recent Labs    09/13/21 0433 09/14/21 0444 09/14/21 1249 09/15/21 0439  HGB 7.7* 8.2*  --  7.2*  HCT 27.8* 28.8*  --  26.5*  PLT 340 294  --  272  HEPARINUNFRC  --  0.56 0.28* 0.49  CREATININE 0.68 0.65  --  0.72     Estimated Creatinine Clearance: 67.1 mL/min (by C-G formula based on SCr of 0.72 mg/dL).   Assessment: 51yo female presents to ED w/ AMS and decreased LOC and CT reveals large right hemisphere stroke with mass effect and hemorrhagic conversion.  CT venogram shows cerebral venous sinus thromosis and patient was started on IV heparin.  D/w neuro Dr. Rory Percy to confirm anticoagulation in this pt with concurrent bleed; will dose and adjust cautiously with close monitoring.  Heparin level this AM drifted up to slightly supratherapeutic for low end goals on 850 units/hr   2/25 AM update:  Heparin level therapeutic  Hgb in the 7-8 range for several days  Goal of Therapy:  Heparin level 0.3-0.5 units/ml Monitor platelets by anticoagulation protocol: Yes   Plan:  Cont heparin at 850 units/hr 1200 heparin level  Narda Bonds, PharmD, BCPS Clinical Pharmacist Phone: [phone]

## 2021-09-16 DIAGNOSIS — I639 Cerebral infarction, unspecified: Secondary | ICD-10-CM | POA: Diagnosis not present

## 2021-09-16 DIAGNOSIS — I10 Essential (primary) hypertension: Secondary | ICD-10-CM | POA: Diagnosis present

## 2021-09-16 DIAGNOSIS — E785 Hyperlipidemia, unspecified: Secondary | ICD-10-CM | POA: Diagnosis present

## 2021-09-16 DIAGNOSIS — G936 Cerebral edema: Secondary | ICD-10-CM | POA: Diagnosis not present

## 2021-09-16 DIAGNOSIS — G08 Intracranial and intraspinal phlebitis and thrombophlebitis: Secondary | ICD-10-CM

## 2021-09-16 DIAGNOSIS — D509 Iron deficiency anemia, unspecified: Secondary | ICD-10-CM

## 2021-09-16 DIAGNOSIS — R569 Unspecified convulsions: Secondary | ICD-10-CM

## 2021-09-16 DIAGNOSIS — D571 Sickle-cell disease without crisis: Secondary | ICD-10-CM | POA: Diagnosis present

## 2021-09-16 DIAGNOSIS — J690 Pneumonitis due to inhalation of food and vomit: Secondary | ICD-10-CM | POA: Diagnosis present

## 2021-09-16 LAB — CBC
HCT: 25.2 % — ABNORMAL LOW (ref 36.0–46.0)
Hemoglobin: 7.5 g/dL — ABNORMAL LOW (ref 12.0–15.0)
MCH: 19.7 pg — ABNORMAL LOW (ref 26.0–34.0)
MCHC: 29.8 g/dL — ABNORMAL LOW (ref 30.0–36.0)
MCV: 66.1 fL — ABNORMAL LOW (ref 80.0–100.0)
Platelets: 268 10*3/uL (ref 150–400)
RBC: 3.81 MIL/uL — ABNORMAL LOW (ref 3.87–5.11)
RDW: 20.7 % — ABNORMAL HIGH (ref 11.5–15.5)
WBC: 8.7 10*3/uL (ref 4.0–10.5)
nRBC: 3.5 % — ABNORMAL HIGH (ref 0.0–0.2)

## 2021-09-16 LAB — BASIC METABOLIC PANEL
Anion gap: 9 (ref 5–15)
BUN: 10 mg/dL (ref 6–20)
CO2: 23 mmol/L (ref 22–32)
Calcium: 9.1 mg/dL (ref 8.9–10.3)
Chloride: 110 mmol/L (ref 98–111)
Creatinine, Ser: 0.65 mg/dL (ref 0.44–1.00)
GFR, Estimated: >60 mL/min (ref >60)
Glucose, Bld: 117 mg/dL — ABNORMAL HIGH (ref 70–99)
Potassium: 3.5 mmol/L (ref 3.5–5.1)
Sodium: 142 mmol/L (ref 135–145)

## 2021-09-16 LAB — GLUCOSE, CAPILLARY
Glucose-Capillary: 109 mg/dL — ABNORMAL HIGH (ref 70–99)
Glucose-Capillary: 112 mg/dL — ABNORMAL HIGH (ref 70–99)
Glucose-Capillary: 120 mg/dL — ABNORMAL HIGH (ref 70–99)
Glucose-Capillary: 125 mg/dL — ABNORMAL HIGH (ref 70–99)

## 2021-09-16 LAB — ALBUMIN: Albumin: 2.6 g/dL — ABNORMAL LOW (ref 3.5–5.0)

## 2021-09-16 LAB — HEPARIN LEVEL (UNFRACTIONATED)
Heparin Unfractionated: 0.45 IU/mL (ref 0.30–0.70)
Heparin Unfractionated: 0.35 IU/mL (ref 0.30–0.70)

## 2021-09-16 LAB — PHENYTOIN LEVEL, TOTAL: Phenytoin Lvl: 14.9 ug/mL (ref 10.0–20.0)

## 2021-09-16 MED ORDER — LEVETIRACETAM 500 MG PO TABS
500.0000 mg | ORAL_TABLET | Freq: Two times a day (BID) | ORAL | Status: DC
Start: 2021-09-16 — End: 2021-09-19
  Administered 2021-09-16 – 2021-09-19 (×7): 500 mg via ORAL
  Filled 2021-09-16 (×7): qty 1

## 2021-09-16 MED ORDER — PHENYTOIN SODIUM EXTENDED 100 MG PO CAPS: 100.0000 mg | ORAL_CAPSULE | Freq: Two times a day (BID) | ORAL | Status: DC

## 2021-09-16 MED ORDER — PAROXETINE HCL 10 MG PO TABS
10.0000 mg | ORAL_TABLET | Freq: Every day | ORAL | Status: DC
  Administered 2021-09-16 – 2021-09-19 (×4): 10 mg via ORAL
  Filled 2021-09-16 (×4): qty 1

## 2021-09-16 NOTE — Assessment & Plan Note (Addendum)
Completed course of antibiotics while inpatient.  Oxygen well on room air.

## 2021-09-16 NOTE — Assessment & Plan Note (Addendum)
Stable, hemoglobin 7.7 on discharge.

## 2021-09-16 NOTE — Assessment & Plan Note (Signed)
No crisis.

## 2021-09-16 NOTE — Assessment & Plan Note (Addendum)
Concern for uncal herniation. Repeat CT stable on 2/24 stable with 9 mm shift.  Patient did receive hypertonic saline per neurosurgery recommendations during hospital course. Mass effect on the repeat CT angio of the head and CT venogram is stable to mildly improved.

## 2021-09-16 NOTE — Progress Notes (Signed)
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  No Known Allergies  Patient Measurements: Height: 4\' 11"  (149.9 cm) Weight: 61.4 kg (135 lb 5.8 oz) IBW/kg (Calculated) : 43.2 Heparin Dosing Weight: 55kg  Vital Signs: Temp: 98.3 F (36.8 C) (02/26 0032) Temp Source: Oral (02/26 0032) BP: 116/65 (02/26 0433) Pulse Rate: 104 (02/26 0433)  Labs: Recent Labs    09/14/21 0444 09/14/21 1249 09/15/21 0439 09/15/21 1215 09/15/21 2100 09/16/21 0430 09/16/21 0445  HGB 8.2*  --  7.2*  --   --   --  7.5*  HCT 28.8*  --  26.5*  --   --   --  25.2*  PLT 294  --  272  --   --   --  268  HEPARINUNFRC 0.56   < > 0.49 0.59 0.54 0.45  --   CREATININE 0.65  --  0.72  --   --   --  0.65   < > = values in this interval not displayed.     Estimated Creatinine Clearance: 67.1 mL/min (by C-G formula based on SCr of 0.65 mg/dL).   Assessment: 51yo female presents to ED w/ AMS and decreased LOC and CT reveals large right hemisphere stroke with mass effect and hemorrhagic conversion.  CT venogram shows cerebral venous sinus thromosis and patient was started on IV heparin.  D/w neuro Dr. Rory Percy to confirm anticoagulation in this pt with concurrent bleed; will dose and adjust cautiously with close monitoring.  Heparin level this AM drifted up to slightly supratherapeutic for low end goals on 850 units/hr   2/26 AM update:  Heparin level therapeutic  Hgb in the 7-8 range for several days  Goal of Therapy:  Heparin level 0.3-0.5 units/ml Monitor platelets by anticoagulation protocol: Yes   Plan:  Cont heparin at 750 units/hr 1400 heparin level  Narda Bonds, PharmD, BCPS Clinical Pharmacist Phone: [phone]

## 2021-09-16 NOTE — Progress Notes (Addendum)
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  No Known Allergies  Patient Measurements: Height: 4\' 11"  (149.9 cm) Weight: 61.4 kg (135 lb 5.8 oz) IBW/kg (Calculated) : 43.2 Heparin Dosing Weight: 55kg  Vital Signs: Temp: 98.3 F (36.8 C) (02/26 1146) Temp Source: Oral (02/26 1146) BP: 111/77 (02/26 1146) Pulse Rate: 108 (02/26 1146)  Labs: Recent Labs    09/14/21 0444 09/14/21 1249 09/15/21 0439 09/15/21 1215 09/15/21 2100 09/16/21 0430 09/16/21 0445 09/16/21 1400  HGB 8.2*  --  7.2*  --   --   --  7.5*  --   HCT 28.8*  --  26.5*  --   --   --  25.2*  --   PLT 294  --  272  --   --   --  268  --   HEPARINUNFRC 0.56   < > 0.49   < > 0.54 0.45  --  0.35  CREATININE 0.65  --  0.72  --   --   --  0.65  --    < > = values in this interval not displayed.     Estimated Creatinine Clearance: 67.1 mL/min (by C-G formula based on SCr of 0.65 mg/dL).   Assessment: 51yo female presents to ED w/ AMS and decreased LOC and CT reveals large right hemisphere stroke with mass effect and hemorrhagic conversion.  CT venogram shows cerebral venous sinus thromosis and patient was started on IV heparin.  D/w neuro Dr. Rory Percy to confirm anticoagulation in this pt with concurrent bleed; will dose and adjust cautiously with close monitoring.  Heparin level therapeutic (0.35) on infusion at 750 units/hr. Hgb 7-8s.  Goal of Therapy:  Heparin level 0.3-0.5 units/ml Monitor platelets by anticoagulation protocol: Yes   Plan:  Continue heparin at 750 units/hr F/u daily heparin level  Sherlon Handing, PharmD, BCPS Please see amion for complete clinical pharmacist phone list 09/16/2021 3:42 PM

## 2021-09-16 NOTE — Plan of Care (Signed)
°  Problem: Education: Goal: Knowledge of General Education information will improve Description: Including pain rating scale, medication(s)/side effects and non-pharmacologic comfort measures Outcome: Progressing   Problem: Health Behavior/Discharge Planning: Goal: Ability to manage health-related needs will improve Outcome: Progressing   Problem: Clinical Measurements: Goal: Ability to maintain clinical measurements within normal limits will improve Outcome: Progressing Goal: Will remain free from infection Outcome: Progressing Goal: Diagnostic test results will improve Outcome: Progressing Goal: Respiratory complications will improve Outcome: Progressing Goal: Cardiovascular complication will be avoided Outcome: Progressing   Problem: Nutrition: Goal: Adequate nutrition will be maintained Outcome: Progressing   Problem: Elimination: Goal: Will not experience complications related to bowel motility Outcome: Progressing Goal: Will not experience complications related to urinary retention Outcome: Progressing   Problem: Pain Managment: Goal: General experience of comfort will improve Outcome: Progressing   Problem: Safety: Goal: Ability to remain free from injury will improve Outcome: Progressing   Problem: Skin Integrity: Goal: Risk for impaired skin integrity will decrease Outcome: Progressing   Problem: Education: Goal: Knowledge of disease or condition will improve Outcome: Progressing Goal: Knowledge of secondary prevention will improve (SELECT ALL) Outcome: Progressing Goal: Knowledge of patient specific risk factors will improve (INDIVIDUALIZE FOR PATIENT) Outcome: Progressing Goal: Individualized Educational Video(s) Outcome: Progressing   Problem: Coping: Goal: Will verbalize positive feelings about self Outcome: Progressing   Problem: Health Behavior/Discharge Planning: Goal: Ability to manage health-related needs will improve Outcome: Progressing    Problem: Self-Care: Goal: Verbalization of feelings and concerns over difficulty with self-care will improve Outcome: Progressing Goal: Ability to communicate needs accurately will improve Outcome: Progressing   Problem: Nutrition: Goal: Risk of aspiration will decrease Outcome: Progressing Goal: Dietary intake will improve Outcome: Progressing   Problem: Ischemic Stroke/TIA Tissue Perfusion: Goal: Complications of ischemic stroke/TIA will be minimized Outcome: Progressing   Problem: Spontaneous Subarachnoid Hemorrhage Tissue Perfusion: Goal: Complications of Spontaneous Subarachnoid Hemorrhage will be minimized Outcome: Progressing

## 2021-09-16 NOTE — Progress Notes (Signed)
[facility]                                                                               April Reilly, is a 51 y.o. female, DOB - 1971-05-31, TKP:[id number] Admit date - 09/10/2021    Outpatient Primary MD for the patient is Pcp, No  LOS - 6  days    Brief summary   [name] is a 51 y.o. female with history of sickle cell,  presenting with altered mental status, decreased LOC, flu like symptoms and possible unwitnessed fall out of bed. CT shows  large region of cortical subcortical edema within the right frontal parietal occipital and temporal lobes as well as right insula and subinsular region with patchy foci of acute parenchymal hemorrhage in the same areas. Concern for an acute, early subacute ischemia versus thrombosis of the venous system. 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 Heparin stroke protocol initiated. Hypertonic saline for cerebral edema. CT scan 2/21 showed progressive increase in midline shift from 32mm to 31mm, unchanged hemorrhage in right cerebral hemisphere. Neurosurgery recommends medical management with hypertonic saline as opposed to crani due to heparin infusion.   Assessment & Plan    Assessment and Plan: Acute cerebral venous sinus thrombosis CTA head and neck shows multi focal narrowing in the right > left A1 , right M1 and M2 segments reflecting vasospasm. Hyperdensity and IPH in the right cerebral hemisphere, concerning for infarct with hemorrhagic conversion, with 11 mm of right-to-left midline shift, and effacement of the basal cisterns. CT venogram shows venous sinus thrombosis in the distal right transverse sinus and right sigmoid sinus.   MRI shows large area of cortical infarction in the right temporal , occipital parietal and posterior frontal lobes with sig associated edema and remonstrated intra parenchymal hemorrhage.   Neuro surgery and Neurology on board.  .  [place] lower extremity is neg for DVT.  Echocardiogram reviewed and wnl.  LDL is 84, A1c is 5.3 .  Currently on IV heparin.  Repeat CT angio of the head and CT venogram today.   Seizure April Reilly) Seizure free.  On keppra 500 mg BID.  LTM discontinued   Essential hypertension- (present on admission) Well controlled.   Cerebral edema ([place])- (present on admission) Concern for uncal herniation.  Repeat CT stable on 2/24 stable with 9 mm shift.  Hypertonic saline  Sodium is wnl.  Sickle cell disease ([place])- (present on admission) No crisis.   Aspiration pneumonia ([place])- (present on admission) completed the course of antibiotics. Pt on RA.   Hyperlipidemia- (present on admission) Resume statin  Iron deficiency anemia Transfuse to keep hemoglobin greater than 7.        Malnutrition Type:  Nutrition Problem: Inadequate oral intake Etiology: dysphagia On dysphagia 2 diet with thin fluids.   Nutrition Interventions:  Interventions: Tube feeding, Prostat, Ensure Enlive (each supplement provides 350kcal and 20 grams of protein)  Estimated body mass index is 27.34 kg/m as calculated from the following:   Height as of this encounter: 4\' 11"  (1.499 m).   Weight as of this encounter: 61.4 kg.  Code Status:  full code.  DVT Prophylaxis:  Place and maintain sequential compression device Start: 09/10/21 1450 SCD's Start: 09/10/21 1138   Level of Care: Level of care: Telemetry Medical Family Communication: none at bedside.   Disposition Plan:     Remains inpatient appropriate:  IV heparin , repeat imaging today.   Procedures:  CT venogram.   Consultants:   PCCM  Neurology.  Neuro surgery.   Antimicrobials:   Anti-infectives (From admission, onward)    Start     Dose/Rate Route Frequency Ordered Stop   09/10/21 1800  acyclovir (ZOVIRAX) 600 mg in dextrose 5 % 100 mL IVPB  Status:  Discontinued        600 mg 112 mL/hr over 60 Minutes Intravenous Every 8 hours 09/10/21  1718 09/11/21 0724   09/10/21 0845  cefTRIAXone (ROCEPHIN) 2 g in sodium chloride 0.9 % 100 mL IVPB  Status:  Discontinued        2 g 200 mL/hr over 30 Minutes Intravenous Every 24 hours 09/10/21 0830 09/11/21 0724   09/10/21 0845  azithromycin (ZITHROMAX) 500 mg in sodium chloride 0.9 % 250 mL IVPB  Status:  Discontinued        500 mg 250 mL/hr over 60 Minutes Intravenous Every 24 hours 09/10/21 0830 09/11/21 0724        Medications  Scheduled Meds:  atorvastatin  40 mg Oral Daily   chlorhexidine  15 mL Mouth Rinse BID   Chlorhexidine Gluconate Cloth  6 each Topical Q0600   fiber  1 packet Oral BID   levETIRAcetam  500 mg Oral BID   mouth rinse  15 mL Mouth Rinse q12n4p   megestrol  40 mg Oral BID   metoprolol tartrate  25 mg Oral BID   PARoxetine  10 mg Oral Daily   senna-docusate  2 tablet Oral QHS   sodium chloride flush  10-40 mL Intracatheter Q12H   Continuous Infusions:  heparin 750 Units/hr (09/16/21 0459)   PRN Meds:.acetaminophen **OR** acetaminophen (TYLENOL) oral liquid 160 mg/5 mL **OR** acetaminophen, butalbital-acetaminophen-caffeine, labetalol, sodium chloride flush, traMADol    Subjective:   April Reilly was seen and examined today.  Pt denies any new complaints.   Objective:   Vitals:   09/16/21 0032 09/16/21 0433 09/16/21 0821 09/16/21 1146  BP: 117/76 116/65 108/70 111/77  Pulse: 99 (!) 104 95 (!) 108  Resp: 18  18 18   Temp: 98.3 F (36.8 C)  98.4 F (36.9 C) 98.3 F (36.8 C)  TempSrc: Oral  Oral Oral  SpO2: 98% 98% 100% 99%  Weight:      Height:        Intake/Output Summary (Last 24 hours) at 09/16/2021 1428 Last data filed at 09/16/2021 1400 Gross per 24 hour  Intake 249.54 ml  Output 1300 ml  Net -1050.46 ml   Filed Weights   09/13/21 0500 09/14/21 0500 09/15/21 0444  Weight: 62.4 kg 62.4 kg 61.4 kg     Exam General: Alert and oriented x 3, NAD Cardiovascular: S1 S2 auscultated, no murmurs, RRR Respiratory: Clear to  auscultation bilaterally, no wheezing, rales or rhonchi Gastrointestinal: Soft, nontender, nondistended, + bowel sounds Ext: no pedal edema bilaterally Neuro: AAOx3, Cr N's II- XII.  Skin: No rashes Psych:  alert and oriented x3    Data Reviewed:  I have personally reviewed following labs and imaging studies   CBC Lab Results  Component Value Date   WBC 8.7 09/16/2021   RBC 3.81 (L) 09/16/2021   HGB 7.5 (  L) 09/16/2021   HCT 25.2 (L) 09/16/2021   MCV 66.1 (L) 09/16/2021   MCH 19.7 (L) 09/16/2021   PLT 268 09/16/2021   MCHC 29.8 (L) 09/16/2021   RDW 20.7 (H) 09/16/2021   LYMPHSABS 0.9 09/10/2021   MONOABS 0.7 09/10/2021   EOSABS 0.0 09/10/2021   BASOSABS 0.0 22/44/9753     Last metabolic panel Lab Results  Component Value Date   NA 142 09/16/2021   K 3.5 09/16/2021   CL 110 09/16/2021   CO2 23 09/16/2021   BUN 10 09/16/2021   CREATININE 0.65 09/16/2021   GLUCOSE 117 (H) 09/16/2021   GFRNONAA >60 09/16/2021   CALCIUM 9.1 09/16/2021   PHOS 3.9 09/12/2021   PROT 8.1 09/10/2021   ALBUMIN 2.6 (L) 09/16/2021   BILITOT 1.2 09/10/2021   ALKPHOS 58 09/10/2021   AST 30 09/10/2021   ALT 19 09/10/2021   ANIONGAP 9 09/16/2021    CBG (last 3)  Recent Labs    09/16/21 0433 09/16/21 0819 09/16/21 1150  GLUCAP 109* 112* 125*      Coagulation Profile: Recent Labs  Lab 09/10/21 1437  INR 1.2     Radiology Studies: CT HEAD WO CONTRAST (5MM)  Result Date: 09/14/2021 CLINICAL DATA:  Stroke, follow up EXAM: CT HEAD WITHOUT CONTRAST TECHNIQUE: Contiguous axial images were obtained from the base of the skull through the vertex without intravenous contrast. RADIATION DOSE REDUCTION: This exam was performed according to the departmental dose-optimization program which includes automated exposure control, adjustment of the mA and/or kV according to patient size and/or use of iterative reconstruction technique. COMPARISON:  09/12/2021 FINDINGS: Brain: Edema and patchy  hemorrhage are again identified in the right parietal, temporal, and occipital lobes extending to involve deep white matter. Mass effect is similar including leftward midline shift (9 mm), diffuse sulcal effacement, and basal cistern effacement. Hemorrhage is again noted within the right occipital horn. Ventricle size is similar with possible minimal increase in size of the left lateral ventricle. No new loss of gray-white differentiation. Vascular: No new findings. Skull: Calvarium is unremarkable. Sinuses/Orbits: No acute finding. Other: None. IMPRESSION: Stable extent of infarction and hemorrhage. Associated edema, mass effect, and ventricle caliber similar. Electronically Signed   By: Macy Mis M.D.   On: 09/14/2021 16:33       Hosie Poisson M.D. [facility] 09/16/2021, 2:28 PM  Available via Epic secure chat 7am-7pm After 7 pm, please refer to night coverage provider listed on amion.

## 2021-09-16 NOTE — Assessment & Plan Note (Addendum)
Continue Keppra 500 mg p.o. twice daily.

## 2021-09-16 NOTE — Assessment & Plan Note (Addendum)
Atorvastatin 40 mg p.o. daily

## 2021-09-16 NOTE — Hospital Course (Addendum)
Ms. Keysi Oelkers is a 51 y.o. female with history of sickle cell,  presenting with altered mental status, decreased LOC, flu like symptoms and possible unwitnessed fall out of bed. CT shows  large region of cortical subcortical edema within the right frontal parietal occipital and temporal lobes as well as right insula and subinsular region with patchy foci of acute parenchymal hemorrhage in the same areas. Concern for an acute, early subacute ischemia versus thrombosis of the venous system. 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 Heparin stroke protocol initiated. Hypertonic saline for cerebral edema. CT scan 2/21 showed progressive increase in midline shift from 22mm to 41mm, unchanged hemorrhage in right cerebral hemisphere. Neurosurgery recommends medical management with hypertonic saline as opposed to crani due to heparin infusion.

## 2021-09-16 NOTE — Assessment & Plan Note (Addendum)
Neurology neurosurgery was consulted and followed during hospital course.  CTA head and neck shows multi focal narrowing in the right > left A1 , right M1 and M2 segments reflecting vasospasm. Hyperdensity and IPH in the right cerebral hemisphere, concerning for infarct with hemorrhagic conversion, with 11 mm of right-to-left midline shift, and effacement of the basal cisterns. CT venogram shows venous sinus thrombosis in the distal right transverse sinus and right sigmoid sinus.  She was started on IV heparin and transitioned to Eliquis. MRI shows large area of cortical infarction in the right temporal , occipital parietal and posterior frontal lobes with sig associated edema and remonstrated intra parenchymal hemorrhage. CT venogram showed Evolving hemorrhagic infarction primarily involving temporal and occipital lobes with extension into the parietal lobe. No new hemorrhage. Mass effect is stable to mildly improved.  Venous duplex ultrasound lower extremities negative for DVT.  LDL 84, hemoglobin A1c 5.3.  TTE with no significant findings.  Continue Eliquis.  Outpatient follow-up with neurology in 3 months.

## 2021-09-16 NOTE — Progress Notes (Addendum)
STROKE TEAM PROGRESS NOTE   INTERVAL HISTORY Cortrak removed yesterday, encourage adequate PO intake. K 3.5 Na 142 hemoglobin stable 7.5 She is in bed eating breakfast. She is communicative, but more tired today.   Vitals:   09/15/21 1518 09/15/21 2003 09/16/21 0032 09/16/21 0433  BP: 109/80 112/76 117/76 116/65  Pulse: 98 84 99 (!) 104  Resp: 20 18 18    Temp: 99 F (37.2 C)  98.3 F (36.8 C)   TempSrc: Oral  Oral   SpO2: 98% 97% 98% 98%  Weight:      Height:       CBC:  Recent Labs  Lab 09/10/21 0847 09/11/21 0419 09/15/21 0439 09/16/21 0445  WBC 12.9*   < > 9.2 8.7  NEUTROABS 11.2*  --   --   --   HGB 9.1*   < > 7.2* 7.5*  HCT 32.0*   < > 26.5* 25.2*  MCV 66.0*   < > 68.8* 66.1*  PLT 402*   < > 272 268   < > = values in this interval not displayed.    Basic Metabolic Panel:  Recent Labs  Lab 09/12/21 0507 09/12/21 0946 09/12/21 1748 09/12/21 2215 09/15/21 0439 09/15/21 1008 09/16/21 0445  NA 161*   < > 157*   < > 147* 146* 142  K 3.3*  --   --    < > 3.7  --  3.5  CL >130*  --   --    < > 115*  --  110  CO2 20*  --   --    < > 23  --  23  GLUCOSE 112*  --   --    < > 161*  --  117*  BUN 9  --   --    < > 15  --  10  CREATININE 0.69  --   --    < > 0.72  --  0.65  CALCIUM 8.7*  --   --    < > 8.8*  --  9.1  MG 2.4  --  2.2  --   --   --   --   PHOS 3.2  --  3.9  --   --   --   --    < > = values in this interval not displayed.    Lipid Panel:  Recent Labs  Lab 09/11/21 0422  CHOL 139  TRIG 47  HDL 46  CHOLHDL 3.0  VLDL 9  LDLCALC 84    HgbA1c:  Recent Labs  Lab 09/11/21 2009  HGBA1C 5.3    Urine Drug Screen:  Recent Labs  Lab 09/10/21 1313  LABOPIA NONE DETECTED  COCAINSCRNUR NONE DETECTED  LABBENZ NONE DETECTED  AMPHETMU NONE DETECTED  THCU NONE DETECTED  LABBARB NONE DETECTED     Alcohol Level No results for input(s): ETH in the last 168 hours.  IMAGING past 24 hours No results found.  PHYSICAL EXAM  Temp:  [97.8 F  (36.6 C)-99 F (37.2 C)] 98.3 F (36.8 C) (02/26 0032) Pulse Rate:  [69-109] 104 (02/26 0433) Resp:  [18-29] 18 (02/26 0032) BP: (109-130)/(65-80) 116/65 (02/26 0433) SpO2:  [94 %-99 %] 98 % (02/26 0433)  General - Well nourished, well developed  Ophthalmologic - fundi not visualized due to noncooperation.  Cardiovascular - Regular rhythm and rate.  Neuro - awake, alert, likes having eyes closed, but able to open when requested. Orientated to age, place, person, and time.  No aphasia, able to see simple sentences, following all simple commands, however moderate dysarthria. Able to name and repeat. Right gaze preference but able to cross midline, visual field not cooperative on exam, pupil equal bilaterally today. Mild left facial droop. Tongue protrusion not cooperative. RUE and RLE at least 4/5, LUE 2+/5 and LLE 3-/5. Sensation symmetrical bilaterally subjectively, right FTN intact grossly, gait not tested.     ASSESSMENT/PLAN [name] is a 51 y.o. female with history of sickle cell,  presenting with altered mental status, decreased LOC, flu like symptoms and possible unwitnessed fall out of bed. CT shows  large region of cortical subcortical edema within the right frontal parietal occipital and temporal lobes as well as right insula and subinsular region with patchy foci of acute parenchymal hemorrhage in the same areas. Concern for an acute, early subacute ischemia versus thrombosis of the venous system. 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 Heparin stroke protocol initiated. Hypertonic saline for cerebral edema. CT scan 2/21 showed progressive increase in midline shift from 38mm to 50mm, unchanged hemorrhage in right cerebral hemisphere. Repeat CT scan ordered for 2/22. Neurosurgery recommends medical management with hypertonic saline as opposed to crani due to heparin infusion. Gyn recommends resuming megestrol  40mg  BID for uterine fibroids.   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 Midline shift increased from 9 to 11 mm. 2/24- Repeat CT- midline shift 31mm, diffuse sulcal effacement, and basal cistern effacement. Minimal increase in size of left lateral ventricle.  CTA head & neck- Multifocal narrowing in the right-greater-than-left A1, right M1, and proximal right M2 segments, likely reflecting vasospasm. Narrowing in the right petrous and cavernous ICA may also be related to known intracranial hemorrhage. Hyperdensity and IPH in the right cerebral hemisphere, concerning for infarct with hemorrhagic conversion, with 11 mm of right-to-left midline shift, and effacement of the basal cisterns. 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 MRI- Large area of cortical infarction in the right temporal, occipital, parietal, and posterior frontal lobes, with significant associated edema and redemonstrated intraparenchymal hemorrhage. The mass effect causes 11 mm of right-to-left midline shift, medial displacement of the right uncus and right temporal horn and effacement of the basal cisterns CT repeat in a.m. stable cerebral edema [place] Lower extremity duplex no DVT 2D Echo EF 60-65%, normal LV function LDL 84 HgbA1c 5.3 VTE prophylaxis - IV Heparin No antithrombotic prior to admission, now on heparin IV.  Therapy recommendations:  CIR Disposition:  Pending  Venous sinus thrombus CTV- venous sinus thrombus in the distal right transverse sinus and right sigmoid sinus IV heparin infusion management by pharmacy Goal 0.3-0.7 0.56->0.28-> 0.49  Cerebral edema- concern for uncal  herniation Repeat CT scan midline shift increase from 88mm -> 37mm Repeat CT 2/22 midline shift at 9 mm Repeat CT 2/24 Stable with 70mm shift. Hypertonic saline 43ml/hr -> NS->FW 200 Q4->TF Na goal 150-155 Na- 146-> 153->157->161->158->156->153->149-> 147->146->142  Concern for seizure activity EEG- evidence of epileptogenicity and cortical dysfunction, no definite seizures seen throughout the recording, high potential for seizure recurrence. LTM EEG 2/21 evidence of epileptogenicity and cortical dysfunction arising from right hemisphere, maximal right parieto-occipital region likely due to underlying hemorrhage  LTM EEG 2/22 evidence of epileptogenicity and cortical dysfunction arising from right hemisphere, maximal right parieto-occipital region likely due to underlying hemorrhage. This EEG pattern is on the ictal-interictal continuum with higher potential for seizure recurrence. Additionally there is moderate diffuse encephalopathy, nonspecific etiology. Phenytoin 100mg  q8hr followed by load Dilantin level 18.3-> 18.1->18.6->17.2-> 15.0->14.9 LTM d/c'ed by CCM Seizure precautions  Hypertension Stable BP goal less than 160 Long-term BP goal normotensive  Hyperlipidemia Home meds:  None LDL 84, goal < 70 Con't Lipitor 40 Continue statin on discharge  Dysphagia Speech on board Diet advanced to thin liquids, cortrak d/c'd 2/25  Febrile, resolved Tmax 101.6->100.6->afebrile Trend WBC- 12.9-> 8.4->7.3->8.0-> 9.8 PRN tylenol given Off rocephin, azithromycine and acyclovir  Aspiration pneumonia Repeat CXR- interval development of right lower lobe airspace opacity. Family describes 3 days of URI symptoms. CCM discontinued azithromycin and ceftriaxone Two doses of acyclovir given, low suspicion- d/c'd  Vaginal bleeding Per RN, pt in menstrual period Pt does have hx of uterus fibroids, on megace PTA Now on heparin IV also, curbside consulted OBGYN, [place] to resume [facility]  day # 6  Patient seen and examined by NP/APP with MD. MD to update note as needed.   Janine Ores, DNP, FNP-BC [facility] Pager: [phone]  ATTENDING ATTESTATION:  51 year old female with history of sickle cell presented with altered mental status and unwitnessed fall.  CT shows right frontoparietal intracranial hemorrhage with 9 mm shift.  CT venogram shows venous sinus thrombosis of the distal right transverse sinus and right sigmoid sinus.  She is on heparin drip and hypertonic saline for severe edema.  Repeat CT yesterday stable.  Initially was concern for seizures LTM showed right hemispheric cortical dysfunction and epileptic focus on Dilantin with no further seizure episodes.  Right gaze preference goes to midline, left hemiparesis.  Now on floor bed.   Will get CTA/CTV today to eval for thrombus resolution and consider transition to oral coumain. Stop dilantin IV. Start keppra 500mg  BID.  Add SSRI for post stroke depression.   Dr. Reeves Forth evaluated pt independently, reviewed imaging, chart, labs. Discussed and formulated plan with the APP. Please see APP note above for details.   Total 36 minutes spent on counseling patient and coordinating care, writing notes and reviewing chart.  Woodford Strege,MD   To contact Stroke Continuity provider, please refer to [email or web address] After hours, contact General Neurology

## 2021-09-16 NOTE — Assessment & Plan Note (Addendum)
On metoprolol 50 mg twice a day 

## 2021-09-17 ENCOUNTER — Inpatient Hospital Stay (HOSPITAL_COMMUNITY): Payer: 59

## 2021-09-17 DIAGNOSIS — I10 Essential (primary) hypertension: Secondary | ICD-10-CM | POA: Diagnosis not present

## 2021-09-17 DIAGNOSIS — D509 Iron deficiency anemia, unspecified: Secondary | ICD-10-CM

## 2021-09-17 DIAGNOSIS — I639 Cerebral infarction, unspecified: Secondary | ICD-10-CM | POA: Diagnosis not present

## 2021-09-17 DIAGNOSIS — G936 Cerebral edema: Secondary | ICD-10-CM | POA: Diagnosis not present

## 2021-09-17 LAB — CBC
HCT: 25.9 % — ABNORMAL LOW (ref 36.0–46.0)
Hemoglobin: 7.5 g/dL — ABNORMAL LOW (ref 12.0–15.0)
MCH: 19.6 pg — ABNORMAL LOW (ref 26.0–34.0)
MCHC: 29 g/dL — ABNORMAL LOW (ref 30.0–36.0)
MCV: 67.6 fL — ABNORMAL LOW (ref 80.0–100.0)
Platelets: 280 10*3/uL (ref 150–400)
RBC: 3.83 MIL/uL — ABNORMAL LOW (ref 3.87–5.11)
RDW: 21 % — ABNORMAL HIGH (ref 11.5–15.5)
WBC: 8.5 10*3/uL (ref 4.0–10.5)
nRBC: 3.1 % — ABNORMAL HIGH (ref 0.0–0.2)

## 2021-09-17 LAB — HEPARIN LEVEL (UNFRACTIONATED): Heparin Unfractionated: 0.28 IU/mL — ABNORMAL LOW (ref 0.30–0.70)

## 2021-09-17 MED ORDER — IOHEXOL 350 MG/ML SOLN
75.0000 mL | Freq: Once | INTRAVENOUS | Status: AC | PRN
  Administered 2021-09-17: 75 mL via INTRAVENOUS

## 2021-09-17 NOTE — Progress Notes (Signed)
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  No Known Allergies  Patient Measurements: Height: 4\' 11"  (149.9 cm) Weight: 57.7 kg (127 lb 3.3 oz) IBW/kg (Calculated) : 43.2 Heparin Dosing Weight: 55kg  Vital Signs: Temp: 98.7 F (37.1 C) (02/27 0802) Temp Source: Oral (02/27 0802) BP: 93/80 (02/27 0802) Pulse Rate: 103 (02/27 0802)  Labs: Recent Labs    09/15/21 0439 09/15/21 1215 09/16/21 0430 09/16/21 0445 09/16/21 1400 09/17/21 0430  HGB 7.2*  --   --  7.5*  --  7.5*  HCT 26.5*  --   --  25.2*  --  25.9*  PLT 272  --   --  268  --  280  HEPARINUNFRC 0.49   < > 0.45  --  0.35 0.28*  CREATININE 0.72  --   --  0.65  --   --    < > = values in this interval not displayed.     Estimated Creatinine Clearance: 65.1 mL/min (by C-G formula based on SCr of 0.65 mg/dL).   Assessment: 51yo female presents to ED w/ AMS and decreased LOC and CT reveals large right hemisphere stroke with mass effect and hemorrhagic conversion.  CT venogram shows cerebral venous sinus thromosis and patient was started on IV heparin.  D/w neuro Dr. Rory Percy to confirm anticoagulation in this pt with concurrent bleed; will dose and adjust cautiously with close monitoring.  Heparin level slightly sub-therapeutic this AM  Goal of Therapy:  Heparin level 0.3-0.5 units/ml Monitor platelets by anticoagulation protocol: Yes   Plan:  Increase heparin to 850 units / hr F/u daily heparin level  Thank you Anette Guarneri, PharmD Please see amion for complete clinical pharmacist phone list 09/17/2021 8:34 AM

## 2021-09-17 NOTE — Assessment & Plan Note (Addendum)
Repeat CT venogram and CT angio of the head with and without contrast showed Evolving hemorrhagic infarction primarily involving temporal andoccipital lobes with extension into the parietal lobe. No new hemorrhage. Mass effect is stable to mildly improved. Persistent but improved nonocclusive thrombus within the right distal transverse and sigmoid sinuses.  Outpatient follow-up with neurology 3 months.

## 2021-09-17 NOTE — Progress Notes (Addendum)
Patient ID: April Reilly, female   DOB: [birth year], 51 y.o.   MRN: [id number]  Discussed CTV with pt. Improving but still present venous thrombosis. Plan to transition to pradaxa for at least 3 months will need follow up closely with Stroke Neurology outpt at [facility] with Dr. Leonie Man.   Neurology will sign off. Call with questions.

## 2021-09-17 NOTE — Progress Notes (Addendum)
STROKE TEAM PROGRESS NOTE   INTERVAL HISTORY Still with headaches, fioricet helps. No seizures. Eating well. She has a niece visiting her today.  CTA/V not done yesterday but scheduled for today.   Vitals:   09/17/21 0802 09/17/21 1050 09/17/21 1127 09/17/21 1535  BP: 93/80 93/74 107/74 (!) 87/56  Pulse: (!) 103  (!) 127 97  Resp: 16 20 16 16   Temp: 98.7 F (37.1 C) 97.6 F (36.4 C) 98.1 F (36.7 C)   TempSrc: Oral Oral Oral   SpO2: 98% 98% 97% 98%  Weight:      Height:       CBC:  Recent Labs  Lab 09/16/21 0445 09/17/21 0430  WBC 8.7 8.5  HGB 7.5* 7.5*  HCT 25.2* 25.9*  MCV 66.1* 67.6*  PLT 268 213    Basic Metabolic Panel:  Recent Labs  Lab 09/12/21 0507 09/12/21 0946 09/12/21 1748 09/12/21 2215 09/15/21 0439 09/15/21 1008 09/16/21 0445  NA 161*   < > 157*   < > 147* 146* 142  K 3.3*  --   --    < > 3.7  --  3.5  CL >130*  --   --    < > 115*  --  110  CO2 20*  --   --    < > 23  --  23  GLUCOSE 112*  --   --    < > 161*  --  117*  BUN 9  --   --    < > 15  --  10  CREATININE 0.69  --   --    < > 0.72  --  0.65  CALCIUM 8.7*  --   --    < > 8.8*  --  9.1  MG 2.4  --  2.2  --   --   --   --   PHOS 3.2  --  3.9  --   --   --   --    < > = values in this interval not displayed.    Lipid Panel:  Recent Labs  Lab 09/11/21 0422  CHOL 139  TRIG 47  HDL 46  CHOLHDL 3.0  VLDL 9  LDLCALC 84    HgbA1c:  Recent Labs  Lab 09/11/21 2009  HGBA1C 5.3    Urine Drug Screen:  No results for input(s): LABOPIA, COCAINSCRNUR, LABBENZ, AMPHETMU, THCU, LABBARB in the last 168 hours.   Alcohol Level No results for input(s): ETH in the last 168 hours.  IMAGING past 24 hours CT ANGIO HEAD W OR WO CONTRAST  Result Date: 09/17/2021 CLINICAL DATA:  Stroke, follow-up, intracranial hemorrhage and dural sinus thrombosis EXAM: CT ANGIOGRAPHY HEAD CT VENTOGRAM HEAD TECHNIQUE: Multidetector CT imaging of the head was performed using the standard protocol before and  during bolus administration of intravenous contrast. Imaging was repeated in the delayed venous phase. Multiplanar CT image reconstructions and MIPs were obtained to evaluate the vascular anatomy. RADIATION DOSE REDUCTION: This exam was performed according to the departmental dose-optimization program which includes automated exposure control, adjustment of the mA and/or kV according to patient size and/or use of iterative reconstruction technique. CONTRAST:  16mL OMNIPAQUE IOHEXOL 350 MG/ML SOLN COMPARISON:  Recent CT and MR imaging FINDINGS: CT HEAD Brain: Evolving hemorrhagic infarction is again identified with edema and patchy hemorrhage again noted in the right parietal, temporal, and occipital lobes extending to involve deep white matter tracts. Mass effect is stable to mildly improved with persistent  subcentimeter leftward midline shift. No hydrocephalus. No new loss of gray-white differentiation. Vascular: Better evaluated on CTA and CTV. Skull: Calvarium is unremarkable. Sinuses/Orbits: No acute finding. Other: None. CTA HEAD Anterior circulation: Intracranial internal carotid arteries are patent. Anterior and middle cerebral arteries are patent. Posterior circulation: Intracranial vertebral arteries, basilar artery, and posterior cerebral arteries are patent. Review of the MIP images confirms the above findings. CTV HEAD Superior sagittal sinus is patent. Straight sinus, vein of Galen, and internal cerebral veins are patent. Left transverse and sigmoid sinuses are patent. There are persistent filling defects within the right transverse and sigmoid sinuses but opacification is improved compared to the prior study. IMPRESSION: Evolving hemorrhagic infarction primarily involving temporal and occipital lobes with extension into the parietal lobe. No new hemorrhage. Mass effect is stable to mildly improved. Persistent but improved nonocclusive thrombus within the right distal transverse and sigmoid sinuses.  Electronically Signed   By: Macy Mis M.D.   On: 09/17/2021 13:14   CT VENOGRAM HEAD  Result Date: 09/17/2021 CLINICAL DATA:  Stroke, follow-up, intracranial hemorrhage and dural sinus thrombosis EXAM: CT ANGIOGRAPHY HEAD CT VENTOGRAM HEAD TECHNIQUE: Multidetector CT imaging of the head was performed using the standard protocol before and during bolus administration of intravenous contrast. Imaging was repeated in the delayed venous phase. Multiplanar CT image reconstructions and MIPs were obtained to evaluate the vascular anatomy. RADIATION DOSE REDUCTION: This exam was performed according to the departmental dose-optimization program which includes automated exposure control, adjustment of the mA and/or kV according to patient size and/or use of iterative reconstruction technique. CONTRAST:  51mL OMNIPAQUE IOHEXOL 350 MG/ML SOLN COMPARISON:  Recent CT and MR imaging FINDINGS: CT HEAD Brain: Evolving hemorrhagic infarction is again identified with edema and patchy hemorrhage again noted in the right parietal, temporal, and occipital lobes extending to involve deep white matter tracts. Mass effect is stable to mildly improved with persistent subcentimeter leftward midline shift. No hydrocephalus. No new loss of gray-white differentiation. Vascular: Better evaluated on CTA and CTV. Skull: Calvarium is unremarkable. Sinuses/Orbits: No acute finding. Other: None. CTA HEAD Anterior circulation: Intracranial internal carotid arteries are patent. Anterior and middle cerebral arteries are patent. Posterior circulation: Intracranial vertebral arteries, basilar artery, and posterior cerebral arteries are patent. Review of the MIP images confirms the above findings. CTV HEAD Superior sagittal sinus is patent. Straight sinus, vein of Galen, and internal cerebral veins are patent. Left transverse and sigmoid sinuses are patent. There are persistent filling defects within the right transverse and sigmoid sinuses but  opacification is improved compared to the prior study. IMPRESSION: Evolving hemorrhagic infarction primarily involving temporal and occipital lobes with extension into the parietal lobe. No new hemorrhage. Mass effect is stable to mildly improved. Persistent but improved nonocclusive thrombus within the right distal transverse and sigmoid sinuses. Electronically Signed   By: Macy Mis M.D.   On: 09/17/2021 13:14    PHYSICAL EXAM  Temp:  [97.6 F (36.4 C)-98.7 F (37.1 C)] 98.1 F (36.7 C) (02/27 1127) Pulse Rate:  [88-127] 97 (02/27 1535) Resp:  [15-20] 16 (02/27 1535) BP: (87-107)/(56-80) 87/56 (02/27 1535) SpO2:  [97 %-99 %] 98 % (02/27 1535) Weight:  [57.7 kg] 57.7 kg (02/27 0500)  General - Well nourished, well developed  Ophthalmologic - fundi not visualized due to noncooperation.  Cardiovascular - Regular rhythm and rate.  Neuro - awake, alert, likes having eyes closed, but able to open when requested. Orientated to age, place, person, and time.   No aphasia,  able to see simple sentences, following all simple commands, however moderate dysarthria. Able to name and repeat. Right gaze preference but able to cross midline, visual field not cooperative on exam, pupil equal bilaterally today. Mild left facial droop. Tongue protrusion not cooperative. RUE and RLE at least 4/5, LUE 2+/5 and LLE 3-/5. Sensation symmetrical bilaterally subjectively, right FTN intact grossly, gait not tested.     ASSESSMENT/PLAN [name] is a 51 y.o. female with history of sickle cell,  presenting with altered mental status, decreased LOC, flu like symptoms and possible unwitnessed fall out of bed. CT shows  large region of cortical subcortical edema within the right frontal parietal occipital and temporal lobes as well as right insula and subinsular region with patchy foci of acute parenchymal hemorrhage in the same areas. Concern for an acute, early subacute ischemia versus thrombosis of the  venous system. 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 Heparin stroke protocol initiated. Hypertonic saline for cerebral edema. CT scan 2/21 showed progressive increase in midline shift from 2mm to 49mm, unchanged hemorrhage in right cerebral hemisphere. Repeat CT scan ordered for 2/22. Neurosurgery recommends medical management with hypertonic saline as opposed to crani due to heparin infusion. Gyn recommends resuming megestrol 40mg  BID for uterine fibroids. Repeat CTA/V today shows improving thrombosis and stable ICH.  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 Midline shift increased from 9 to 11 mm. 2/24- Repeat CT- midline shift 42mm, diffuse sulcal effacement, and basal cistern effacement. Minimal increase in size of left lateral ventricle.  CTA head & neck- Multifocal narrowing in the right-greater-than-left A1, right M1, and proximal right M2 segments, likely reflecting vasospasm. Narrowing in the right petrous and cavernous ICA may also be related to known intracranial hemorrhage. Hyperdensity and IPH in the right cerebral hemisphere, concerning for infarct with hemorrhagic conversion, with 11 mm of right-to-left midline shift, and effacement of the basal cisterns. 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 MRI- Large area of cortical infarction in the right temporal, occipital, parietal, and posterior frontal lobes, with significant associated edema and redemonstrated intraparenchymal hemorrhage. The mass effect causes 11 mm of right-to-left midline shift, medial displacement of the right uncus and right temporal  horn and effacement of the basal cisterns CT repeat in a.m. stable cerebral edema Repeat CTA/V: 2/27 shows improving thrombosis. [place] Lower extremity duplex no DVT 2D Echo EF 60-65%, normal LV function LDL 84 HgbA1c 5.3 VTE prophylaxis - IV Heparin No antithrombotic prior to admission, now on heparin IV.  Therapy recommendations:  CIR Disposition:  Pending  Venous sinus thrombus CTV- venous sinus thrombus in the distal right transverse sinus and right sigmoid sinus IV heparin infusion management by pharmacy Goal 0.3-0.7 0.56->0.28-> 0.49  Cerebral edema- concern for uncal herniation Repeat CT scan midline shift increase from 20mm -> 62mm Repeat CT 2/22 midline shift at 9 mm Repeat CT 2/24 Stable with 8mm shift. Hypertonic saline 46ml/hr -> NS->FW 200 Q4->TF Na goal 150-155 Na- 146-> 153->157->161->158->156->153->149-> 147->146->142  Concern for seizure activity EEG- evidence of epileptogenicity and cortical dysfunction, no definite seizures seen throughout the recording, high potential for seizure recurrence. LTM EEG 2/21 evidence of epileptogenicity and cortical dysfunction arising  from right hemisphere, maximal right parieto-occipital region likely due to underlying hemorrhage LTM EEG 2/22 evidence of epileptogenicity and cortical dysfunction arising from right hemisphere, maximal right parieto-occipital region likely due to underlying hemorrhage. This EEG pattern is on the ictal-interictal continuum with higher potential for seizure recurrence. Additionally there is moderate diffuse encephalopathy, nonspecific etiology. Phenytoin 100mg  q8hr followed by load Dilantin level 18.3-> 18.1->18.6->17.2-> 15.0->14.9 LTM d/c'ed by CCM Seizure precautions. On keppra 500mg  BID  Hypertension Stable BP goal less than 160 Long-term BP goal normotensive  Hyperlipidemia Home meds:  None LDL 84, goal < 70 Con't Lipitor 40 Continue statin on discharge  Dysphagia Speech on board Diet  advanced to thin liquids, cortrak d/c'd 2/25  Febrile, resolved Tmax 101.6->100.6->afebrile Trend WBC- 12.9-> 8.4->7.3->8.0-> 9.8 PRN tylenol given Off rocephin, azithromycine and acyclovir  Aspiration pneumonia Repeat CXR- interval development of right lower lobe airspace opacity. Family describes 3 days of URI symptoms. CCM discontinued azithromycin and ceftriaxone Two doses of acyclovir given, low suspicion- d/c'd  Vaginal bleeding Per RN, pt in menstrual period Pt does have hx of uterus fibroids, on megace PTA Now on heparin IV also, curbside consulted OBGYN, Lynn to resume [facility] day # 31   51 year old female with history of sickle cell presented with altered mental status and unwitnessed fall.  CT shows right frontoparietal intracranial hemorrhage with 9 mm shift.  CT venogram shows venous sinus thrombosis of the distal right transverse sinus and right sigmoid sinus.  Initially was concern for seizures LTM showed right hemispheric cortical dysfunction and epileptic focus. On Keppra 500mg  BID. No seizures.     CTA/CTV today shows improving right transverse and sigmoid thrombus.   Total of 35 mins spent reviewing chart, discussion with patient and family on prognosis, Dx and plan. Discussed case with patient's nurse. Reviewed Imaging personally.   Arthella Headings,MD   To contact Stroke Continuity provider, please refer to [email or web address] After hours, contact General Neurology

## 2021-09-17 NOTE — Progress Notes (Signed)
[facility]                                                                               Cadince Hilscher, is a 51 y.o. female, DOB - 1971/02/12, MLY:[id number] Admit date - 09/10/2021    Outpatient Primary MD for the patient is Pcp, No  LOS - 7  days    Brief summary   [name] is a 51 y.o. female with history of sickle cell,  presenting with altered mental status, decreased LOC, flu like symptoms and possible unwitnessed fall out of bed. CT shows  large region of cortical subcortical edema within the right frontal parietal occipital and temporal lobes as well as right insula and subinsular region with patchy foci of acute parenchymal hemorrhage in the same areas. Concern for an acute, early subacute ischemia versus thrombosis of the venous system. 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 Heparin stroke protocol initiated. Hypertonic saline for cerebral edema. CT scan 2/21 showed progressive increase in midline shift from 46mm to 71mm, unchanged hemorrhage in right cerebral hemisphere. Neurosurgery recommends medical management with hypertonic saline as opposed to crani due to heparin infusion. Pt seen today, she reports intermittent headache since morning, partly improved with pain meds. She is scheduled for repeat CT venogram and CT angio head with and without contrast.    Assessment & Plan    Assessment and Plan: Acute cerebral venous sinus thrombosis CTA head and neck shows multi focal narrowing in the right > left A1 , right M1 and M2 segments reflecting vasospasm. Hyperdensity and IPH in the right cerebral hemisphere, concerning for infarct with hemorrhagic conversion, with 11 mm of right-to-left midline shift, and effacement of the basal cisterns. CT venogram shows venous sinus thrombosis in the distal right transverse sinus and right sigmoid sinus.   MRI shows large area of cortical infarction in  the right temporal , occipital parietal and posterior frontal lobes with sig associated edema and remonstrated intra parenchymal hemorrhage.   Neuro surgery and Neurology on board. .  [place] lower extremity is neg for DVT.  Echocardiogram reviewed and wnl.  LDL is 84, A1c is 5.3 .  Currently on IV heparin.  CT venogram showed Evolving hemorrhagic infarction primarily involving temporal and occipital lobes with extension into the parietal lobe. No new hemorrhage. Mass effect is stable to mildly improved.   Persistent but improved nonocclusive thrombus within the right distal transverse and sigmoid sinuses.  waiting for neuro recommendations.   ICH (intracerebral hemorrhage) ([place])- (present on admission) Repeat CT venogram and CT angio of the head with and without contrast showed Evolving hemorrhagic infarction primarily involving temporal and occipital lobes with extension into the parietal lobe. No new hemorrhage. Mass effect is stable to mildly improved.   Persistent but improved nonocclusive thrombus within the right distal transverse and sigmoid sinuses.    Seizure [facility]) Seizure free.  On keppra 500 mg BID.  LTM discontinued   Essential hypertension- (present on admission) BP parameters are optimally controlled.   Cerebral edema ([place])- (present on admission) Concern for uncal herniation.  Repeat CT stable on 2/24 stable with  9 mm shift.  Hypertonic saline  Sodium is wnl. Mass effect on the repeat CT angio of the head and CT venogram is stable to mildly improved.   Sickle cell disease ([place])- (present on admission) No crisis.   Aspiration pneumonia ([place])- (present on admission) completed the course of antibiotics. Pt on RA.   Hyperlipidemia- (present on admission) Resume statin  Iron deficiency anemia Transfuse to keep hemoglobin greater than 7.        Malnutrition Type:  Nutrition Problem: Inadequate oral intake Etiology: dysphagia On dysphagia 2 diet with  thin fluids.   Nutrition Interventions:  Interventions: Tube feeding, Prostat, Ensure Enlive (each supplement provides 350kcal and 20 grams of protein)  Estimated body mass index is 25.69 kg/m as calculated from the following:   Height as of this encounter: 4\' 11"  (1.499 m).   Weight as of this encounter: 57.7 kg.  Code Status: full code.  DVT Prophylaxis:  Place and maintain sequential compression device Start: 09/10/21 1450 SCD's Start: 09/10/21 1138   Level of Care: Level of care: Telemetry Medical Family Communication: none at bedside.   Disposition Plan:     Remains inpatient appropriate:  IV heparin , repeat imaging today.   Procedures:  CT venogram.   Consultants:   PCCM  Neurology.  Neuro surgery.   Antimicrobials:   Anti-infectives (From admission, onward)    Start     Dose/Rate Route Frequency Ordered Stop   09/10/21 1800  acyclovir (ZOVIRAX) 600 mg in dextrose 5 % 100 mL IVPB  Status:  Discontinued        600 mg 112 mL/hr over 60 Minutes Intravenous Every 8 hours 09/10/21 1718 09/11/21 0724   09/10/21 0845  cefTRIAXone (ROCEPHIN) 2 g in sodium chloride 0.9 % 100 mL IVPB  Status:  Discontinued        2 g 200 mL/hr over 30 Minutes Intravenous Every 24 hours 09/10/21 0830 09/11/21 0724   09/10/21 0845  azithromycin (ZITHROMAX) 500 mg in sodium chloride 0.9 % 250 mL IVPB  Status:  Discontinued        500 mg 250 mL/hr over 60 Minutes Intravenous Every 24 hours 09/10/21 0830 09/11/21 0724        Medications  Scheduled Meds:  atorvastatin  40 mg Oral Daily   chlorhexidine  15 mL Mouth Rinse BID   Chlorhexidine Gluconate Cloth  6 each Topical Q0600   fiber  1 packet Oral BID   levETIRAcetam  500 mg Oral BID   mouth rinse  15 mL Mouth Rinse q12n4p   megestrol  40 mg Oral BID   metoprolol tartrate  25 mg Oral BID   PARoxetine  10 mg Oral Daily   senna-docusate  2 tablet Oral QHS   sodium chloride flush  10-40 mL Intracatheter Q12H   Continuous  Infusions:  heparin 850 Units/hr (09/17/21 1044)   PRN Meds:.acetaminophen **OR** acetaminophen (TYLENOL) oral liquid 160 mg/5 mL **OR** acetaminophen, butalbital-acetaminophen-caffeine, labetalol, sodium chloride flush, traMADol    Subjective:   April Reilly was seen and examined today. No chest pain , she has intermittent headache.   Objective:   Vitals:   09/17/21 0802 09/17/21 1050 09/17/21 1127 09/17/21 1535  BP: 93/80 93/74 107/74 (!) 87/56  Pulse: (!) 103  (!) 127 97  Resp: 16 20 16 16   Temp: 98.7 F (37.1 C) 97.6 F (36.4 C) 98.1 F (36.7 C)   TempSrc: Oral Oral Oral   SpO2: 98% 98% 97% 98%  Weight:  Height:        Intake/Output Summary (Last 24 hours) at 09/17/2021 1625 Last data filed at 09/17/2021 1400 Gross per 24 hour  Intake 544.86 ml  Output --  Net 544.86 ml   Filed Weights   09/14/21 0500 09/15/21 0444 09/17/21 0500  Weight: 62.4 kg 61.4 kg 57.7 kg     Exam General exam: Appears calm and comfortable  Respiratory system: Clear to auscultation. Respiratory effort normal. Cardiovascular system: S1 & S2 heard, RRR. No JVD,No pedal edema. Gastrointestinal system: Abdomen is nondistended, soft and nontender.Normal bowel sounds heard. Central nervous system: Alert and oriented. No focal neurological deficits. Extremities: Symmetric 5 x 5 power. Skin: No rashes, lesions or ulcers Psychiatry:anxious.     Data Reviewed:  I have personally reviewed following labs and imaging studies   CBC Lab Results  Component Value Date   WBC 8.5 09/17/2021   RBC 3.83 (L) 09/17/2021   HGB 7.5 (L) 09/17/2021   HCT 25.9 (L) 09/17/2021   MCV 67.6 (L) 09/17/2021   MCH 19.6 (L) 09/17/2021   PLT 280 09/17/2021   MCHC 29.0 (L) 09/17/2021   RDW 21.0 (H) 09/17/2021   LYMPHSABS 0.9 09/10/2021   MONOABS 0.7 09/10/2021   EOSABS 0.0 09/10/2021   BASOSABS 0.0 67/06/4579     Last metabolic panel Lab Results  Component Value Date   NA 142 09/16/2021   K 3.5  09/16/2021   CL 110 09/16/2021   CO2 23 09/16/2021   BUN 10 09/16/2021   CREATININE 0.65 09/16/2021   GLUCOSE 117 (H) 09/16/2021   GFRNONAA >60 09/16/2021   CALCIUM 9.1 09/16/2021   PHOS 3.9 09/12/2021   PROT 8.1 09/10/2021   ALBUMIN 2.6 (L) 09/16/2021   BILITOT 1.2 09/10/2021   ALKPHOS 58 09/10/2021   AST 30 09/10/2021   ALT 19 09/10/2021   ANIONGAP 9 09/16/2021    CBG (last 3)  Recent Labs    09/16/21 0433 09/16/21 0819 09/16/21 1150  GLUCAP 109* 112* 125*      Coagulation Profile: No results for input(s): INR, PROTIME in the last 168 hours.    Radiology Studies: CT ANGIO HEAD W OR WO CONTRAST  Result Date: 09/17/2021 CLINICAL DATA:  Stroke, follow-up, intracranial hemorrhage and dural sinus thrombosis EXAM: CT ANGIOGRAPHY HEAD CT VENTOGRAM HEAD TECHNIQUE: Multidetector CT imaging of the head was performed using the standard protocol before and during bolus administration of intravenous contrast. Imaging was repeated in the delayed venous phase. Multiplanar CT image reconstructions and MIPs were obtained to evaluate the vascular anatomy. RADIATION DOSE REDUCTION: This exam was performed according to the departmental dose-optimization program which includes automated exposure control, adjustment of the mA and/or kV according to patient size and/or use of iterative reconstruction technique. CONTRAST:  78mL OMNIPAQUE IOHEXOL 350 MG/ML SOLN COMPARISON:  Recent CT and MR imaging FINDINGS: CT HEAD Brain: Evolving hemorrhagic infarction is again identified with edema and patchy hemorrhage again noted in the right parietal, temporal, and occipital lobes extending to involve deep white matter tracts. Mass effect is stable to mildly improved with persistent subcentimeter leftward midline shift. No hydrocephalus. No new loss of gray-white differentiation. Vascular: Better evaluated on CTA and CTV. Skull: Calvarium is unremarkable. Sinuses/Orbits: No acute finding. Other: None. CTA HEAD  Anterior circulation: Intracranial internal carotid arteries are patent. Anterior and middle cerebral arteries are patent. Posterior circulation: Intracranial vertebral arteries, basilar artery, and posterior cerebral arteries are patent. Review of the MIP images confirms the above findings. CTV HEAD Superior sagittal  sinus is patent. Straight sinus, vein of [place], and internal cerebral veins are patent. Left transverse and sigmoid sinuses are patent. There are persistent filling defects within the right transverse and sigmoid sinuses but opacification is improved compared to the prior study. IMPRESSION: Evolving hemorrhagic infarction primarily involving temporal and occipital lobes with extension into the parietal lobe. No new hemorrhage. Mass effect is stable to mildly improved. Persistent but improved nonocclusive thrombus within the right distal transverse and sigmoid sinuses. Electronically Signed   By: Macy Mis M.D.   On: 09/17/2021 13:14   CT VENOGRAM HEAD  Result Date: 09/17/2021 CLINICAL DATA:  Stroke, follow-up, intracranial hemorrhage and dural sinus thrombosis EXAM: CT ANGIOGRAPHY HEAD CT VENTOGRAM HEAD TECHNIQUE: Multidetector CT imaging of the head was performed using the standard protocol before and during bolus administration of intravenous contrast. Imaging was repeated in the delayed venous phase. Multiplanar CT image reconstructions and MIPs were obtained to evaluate the vascular anatomy. RADIATION DOSE REDUCTION: This exam was performed according to the departmental dose-optimization program which includes automated exposure control, adjustment of the mA and/or kV according to patient size and/or use of iterative reconstruction technique. CONTRAST:  91mL OMNIPAQUE IOHEXOL 350 MG/ML SOLN COMPARISON:  Recent CT and MR imaging FINDINGS: CT HEAD Brain: Evolving hemorrhagic infarction is again identified with edema and patchy hemorrhage again noted in the right parietal, temporal, and  occipital lobes extending to involve deep white matter tracts. Mass effect is stable to mildly improved with persistent subcentimeter leftward midline shift. No hydrocephalus. No new loss of gray-white differentiation. Vascular: Better evaluated on CTA and CTV. Skull: Calvarium is unremarkable. Sinuses/Orbits: No acute finding. Other: None. CTA HEAD Anterior circulation: Intracranial internal carotid arteries are patent. Anterior and middle cerebral arteries are patent. Posterior circulation: Intracranial vertebral arteries, basilar artery, and posterior cerebral arteries are patent. Review of the MIP images confirms the above findings. CTV HEAD Superior sagittal sinus is patent. Straight sinus, vein of [place], and internal cerebral veins are patent. Left transverse and sigmoid sinuses are patent. There are persistent filling defects within the right transverse and sigmoid sinuses but opacification is improved compared to the prior study. IMPRESSION: Evolving hemorrhagic infarction primarily involving temporal and occipital lobes with extension into the parietal lobe. No new hemorrhage. Mass effect is stable to mildly improved. Persistent but improved nonocclusive thrombus within the right distal transverse and sigmoid sinuses. Electronically Signed   By: Macy Mis M.D.   On: 09/17/2021 13:14       Hosie Poisson M.D. [facility] 09/17/2021, 4:25 PM  Available via Epic secure chat 7am-7pm After 7 pm, please refer to night coverage provider listed on amion.

## 2021-09-17 NOTE — Progress Notes (Signed)
Occupational Therapy Treatment Patient Details Name: April Reilly MRN: [id number] DOB: [birth year] Today's Date: 09/17/2021   History of present illness 51 y.o. female presents to [facility] hospital on 09/10/2021 after fall out of bed followed by AMS. CTH with significant edema in R frontal, parietal, occipital and temporal lobe with superimposed hemorrhage, started on hypertonic saline solution. PMH includes sickle cell trait, anemia.   OT comments  Pt making functional progress, session completed in conjunction with PT to address safe transfers and OOB tasks. She continues to present with weakness and neglect of L hemibody and requires cues to move L extremities functionally. Multiple stand pivots completed with max A +2 for L support and constant cues for attention to task and re-direction. Pt completed toileting, LB dressing and grooming with max A +2 overall, see details below. Pt motivated to participate, joking with daughter throughout. D/c remains appropriate and OT will continue to follow acutely.    Recommendations for follow up therapy are one component of a multi-disciplinary discharge planning process, led by the attending physician.  Recommendations may be updated based on patient status, additional functional criteria and insurance authorization.    Follow Up Recommendations  Acute inpatient rehab (3hours/day)    Assistance Recommended at Discharge Frequent or constant Supervision/Assistance  Patient can return home with the following  Two people to help with walking and/or transfers;Two people to help with bathing/dressing/bathroom;Assistance with feeding;Assistance with cooking/housework;Direct supervision/assist for medications management;Direct supervision/assist for financial management;Assist for transportation;Help with stairs or ramp for entrance   Equipment Recommendations  BSC/3in1;Tub/shower bench;Wheelchair (measurements OT);Wheelchair cushion (measurements OT);Hospital bed     Recommendations for Other Services Rehab consult    Precautions / Restrictions Precautions Precautions: Fall Restrictions Weight Bearing Restrictions: No       Mobility Bed Mobility Overal bed mobility: Needs Assistance Bed Mobility: Rolling, Sidelying to Sit Rolling: Mod assist Sidelying to sit: Mod assist       General bed mobility comments: incr time and cues for sequencing and attention    Transfers Overall transfer level: Needs assistance Equipment used: 2 person hand held assist Transfers: Sit to/from Stand, Bed to chair/wheelchair/BSC Sit to Stand: Max assist, +2 physical assistance, +2 safety/equipment     Step pivot transfers: Max assist, +2 safety/equipment, +2 physical assistance     General transfer comment: increased assist for each transfer with fatigue. constant cues needed for attention with limited carryover noted     Balance Overall balance assessment: Needs assistance Sitting-balance support: Feet supported Sitting balance-Leahy Scale: Poor Sitting balance - Comments: L lateral lean, constant cues and min-min A for mideline balance Postural control: Left lateral lean   Standing balance-Leahy Scale: Zero Standing balance comment: max A                           ADL either performed or assessed with clinical judgement   ADL Overall ADL's : Needs assistance/impaired     Grooming: Wash/dry face;Maximal assistance;Standing Grooming Details (indicate cue type and reason): max A for L side support in sitting adn cues to sustain task. using wipe in R hand             Lower Body Dressing: Maximal assistance;+2 for physical assistance;+2 for safety/equipment Lower Body Dressing Details (indicate cue type and reason): changes mesh underwear and pad Toilet Transfer: Maximal assistance;+2 for physical assistance;+2 for safety/equipment;BSC/3in1;Stand-pivot Toilet Transfer Details (indicate cue type and reason): stood from recliner and  BSC placed behind ToiletingRunner, broadcasting/film/video  and Hygiene: Maximal assistance;+2 for physical assistance;+2 for safety/equipment;Sit to/from stand Toileting - Clothing Manipulation Details (indicate cue type and reason): +1 for standing, pt with BUE supported on sink, +1 for hygiene     Functional mobility during ADLs: Maximal assistance;+2 for physical assistance;+2 for safety/equipment General ADL Comments: multiple stand pivots this session with max A +2, poor attention and requires constant re-direction. Joking with dughter throughout & internall distracted by head ache. L inattention    Extremity/Trunk Assessment Upper Extremity Assessment LUE Deficits / Details: No AROM noted this date, patient very lethargic.  Minor flexor tone noted with PROM to bicep. LUE Sensation: decreased light touch LUE Coordination: decreased fine motor;decreased gross motor   Lower Extremity Assessment Lower Extremity Assessment: Defer to PT evaluation        Vision   Vision Assessment?: Vision impaired- to be further tested in functional context   Perception Perception Perception: Not tested   Praxis Praxis Praxis: Not tested    Cognition Arousal/Alertness: Awake/alert Behavior During Therapy: WFL for tasks assessed/performed Overall Cognitive Status: Impaired/Different from baseline Area of Impairment: Attention, Memory, Following commands, Safety/judgement, Awareness, Problem solving                   Current Attention Level: Sustained Memory: Decreased short-term memory, Decreased recall of precautions Following Commands: Follows one step commands consistently Safety/Judgement: Decreased awareness of deficits, Decreased awareness of safety Awareness: Emergent Problem Solving: Slow processing, Decreased initiation, Requires verbal cues General Comments: Pt alert & oriented, joking appropriately with daughter throughout session. Extremely short attention span, requires frequent  cues and re-directoin.              General Comments VSS on RA, daughter in room    Pertinent Vitals/ Pain       Pain Assessment Pain Assessment: 0-10 Pain Score: 8  Pain Location: head Pain Descriptors / Indicators: Headache Pain Intervention(s): Limited activity within patient's tolerance, Monitored during session   Frequency  Min 2X/week        Progress Toward Goals  OT Goals(current goals can now be found in the care plan section)  Progress towards OT goals: Progressing toward goals  Acute Rehab OT Goals Patient Stated Goal: to walk OT Goal Formulation: Patient unable to participate in goal setting Time For Goal Achievement: 09/26/21 Potential to Achieve Goals: Good ADL Goals Pt Will Perform Grooming: with mod assist;sitting Pt Will Perform Upper Body Bathing: with mod assist;sitting Pt Will Perform Lower Body Bathing: with mod assist;sitting/lateral leans Pt Will Transfer to Toilet: with +2 assist;with mod assist;bedside commode;squat pivot transfer Additional ADL Goal #1: Pt will maintain midline postural control EOB with mod A in preparation for ADL tasks Additional ADL Goal #2: Pt will identify 3/5 targets in L visual field wtih min vc in nondistracting environment  Plan Discharge plan remains appropriate    Co-evaluation    PT/OT/SLP Co-Evaluation/Treatment: Yes Reason for Co-Treatment: Complexity of the patient's impairments (multi-system involvement);For patient/therapist safety;To address functional/ADL transfers   OT goals addressed during session: ADL's and self-care;Strengthening/ROM      AM-PAC OT "6 Clicks" Daily Activity     Outcome Measure   Help from another person eating meals?: A Little Help from another person taking care of personal grooming?: A Lot Help from another person toileting, which includes using toliet, bedpan, or urinal?: A Lot Help from another person bathing (including washing, rinsing, drying)?: A Lot Help from another  person to put on and taking off regular upper body clothing?:  A Lot Help from another person to put on and taking off regular lower body clothing?: A Lot 6 Click Score: 13    End of Session Equipment Utilized During Treatment: Gait belt (BSC)  OT Visit Diagnosis: Unsteadiness on feet (R26.81);Other abnormalities of gait and mobility (R26.89);Muscle weakness (generalized) (M62.81);Low vision, both eyes (H54.2);Apraxia (R48.2);Other symptoms and signs involving the nervous system (R29.898);Other symptoms and signs involving cognitive function;Hemiplegia and hemiparesis Hemiplegia - Right/Left: Left Hemiplegia - dominant/non-dominant: Non-Dominant Hemiplegia - caused by: Cerebral infarction;Nontraumatic intracerebral hemorrhage   Activity Tolerance Patient tolerated treatment well   Patient Left in bed;with call bell/phone within reach;with bed alarm set;with family/visitor present   Nurse Communication Mobility status        Time: 6812-7517 OT Time Calculation (min): 41 min  Charges: OT General Charges $OT Visit: 1 Visit OT Treatments $Self Care/Home Management : 8-22 mins   Breaunna Gottlieb A Ezell Melikian 09/17/2021, 10:08 AM

## 2021-09-17 NOTE — Progress Notes (Signed)
Physical Therapy Treatment Patient Details Name: April Reilly MRN: [id number] DOB: 11/19/1970 Today's Date: 09/17/2021   History of Present Illness 51 y.o. female presents to [facility] hospital on 09/10/2021 after fall out of bed followed by AMS. CTH with significant edema in R frontal, parietal, occipital and temporal lobe with superimposed hemorrhage, started on hypertonic saline solution. PMH includes sickle cell trait, anemia.    PT Comments    Pt very motivated and eager to participate in therapy today however continues with severe L sided inattention, impaired L UE and LE sensation and proprioception, decreased insight to deficits and safety, in addition to poor attention span to stay on task. Pt requiring maxAx2 for OOB mobility. Focused on sitting and standing tolerance and L UE and LE muscle engagement with OT. Acute PT to cont to follow. Continue to recommend AIR upon d/c for maximal functional recovery.   Recommendations for follow up therapy are one component of a multi-disciplinary discharge planning process, led by the attending physician.  Recommendations may be updated based on patient status, additional functional criteria and insurance authorization.  Follow Up Recommendations  Acute inpatient rehab (3hours/day)     Assistance Recommended at Discharge Frequent or constant Supervision/Assistance  Patient can return home with the following Two people to help with walking and/or transfers;Two people to help with bathing/dressing/bathroom;Assistance with cooking/housework;Assistance with feeding;Direct supervision/assist for medications management;Direct supervision/assist for financial management;Assist for transportation;Help with stairs or ramp for entrance   Equipment Recommendations  Wheelchair (measurements PT);Wheelchair cushion (measurements PT)    Recommendations for Other Services Rehab consult     Precautions / Restrictions Precautions Precautions: Fall Precaution  Comments: L sided inattention Restrictions Weight Bearing Restrictions: No     Mobility  Bed Mobility Overal bed mobility: Needs Assistance Bed Mobility: Rolling, Sidelying to Sit Rolling: Mod assist Sidelying to sit: Mod assist       General bed mobility comments: incr time and cues for sequencing and attention, with max verbal and tactile cues pt with good effort to push up with L UE from L sidelying, HOB only elevated slightly    Transfers Overall transfer level: Needs assistance Equipment used: 2 person hand held assist Transfers: Sit to/from Stand, Bed to chair/wheelchair/BSC Sit to Stand: Max assist, +2 physical assistance, +2 safety/equipment   Step pivot transfers: Max assist, +2 safety/equipment, +2 physical assistance       General transfer comment: due to impaired sensation t/o L UE and LE in addition to L inattention pt requiring constant verbal and tactile cues to use L UE and LE to support self, pt did not use L UE functionally and would "melt" on L side requiring blocking of L knee and under arm support to prevent L lateral lean and to provide truncal support. Pt did follow step by step commands to complete step pvt to chair    Ambulation/Gait               General Gait Details: unable at this time   Stairs             Wheelchair Mobility    Modified Rankin (Stroke Patients Only) Modified Rankin (Stroke Patients Only) Pre-Morbid Rankin Score: No symptoms Modified Rankin: Severe disability     Balance Overall balance assessment: Needs assistance Sitting-balance support: Feet supported Sitting balance-Leahy Scale: Poor Sitting balance - Comments: L lateral lean, constant cues and min-min A for mideline balance, pt able to correct to midline however unable to maintain, pt can tell she is leaning to  the L when you ask her however she wont self correct without verbal cues Postural control: Left lateral lean Standing balance support: Single  extremity supported Standing balance-Leahy Scale: Zero Standing balance comment: max Ax2                            Cognition Arousal/Alertness: Awake/alert Behavior During Therapy: WFL for tasks assessed/performed Overall Cognitive Status: Impaired/Different from baseline Area of Impairment: Attention, Memory, Following commands, Safety/judgement, Awareness, Problem solving                   Current Attention Level: Focused (easily distracted) Memory: Decreased short-term memory, Decreased recall of precautions Following Commands: Follows one step commands consistently Safety/Judgement: Decreased awareness of deficits, Decreased awareness of safety Awareness: Emergent (able to tell PT/OT she had to urinate) Problem Solving: Slow processing, Decreased initiation, Requires verbal cues, Difficulty sequencing, Requires tactile cues General Comments: Pt alert & oriented, joking appropriately with daughter throughout session. Extremely short attention span, requires frequent cues and re-direction.Pt more intune with situation and events happening t/o the day despite not realizing it was morning when asking PT/OT to come in the morning next time        Exercises      General Comments General comments (skin integrity, edema, etc.): VSS on RA, daughter Annia Belt present and very helpful/involved in session, appreciative of PT/OT services      Pertinent Vitals/Pain Pain Assessment Pain Assessment: 0-10 Pain Score: 8  Pain Location: head Pain Descriptors / Indicators: Headache Pain Intervention(s): Monitored during session, Premedicated before session    Home Living                          Prior Function            PT Goals (current goals can now be found in the care plan section) Acute Rehab PT Goals Patient Stated Goal: walk PT Goal Formulation: Patient unable to participate in goal setting Time For Goal Achievement: 09/26/21 Potential to Achieve  Goals: Fair Progress towards PT goals: Progressing toward goals    Frequency    Min 4X/week      PT Plan Current plan remains appropriate    Co-evaluation PT/OT/SLP Co-Evaluation/Treatment: Yes Reason for Co-Treatment: Complexity of the patient's impairments (multi-system involvement) PT goals addressed during session: Mobility/safety with mobility OT goals addressed during session: ADL's and self-care;Strengthening/ROM      AM-PAC PT "6 Clicks" Mobility   Outcome Measure  Help needed turning from your back to your side while in a flat bed without using bedrails?: A Lot Help needed moving from lying on your back to sitting on the side of a flat bed without using bedrails?: A Lot Help needed moving to and from a bed to a chair (including a wheelchair)?: A Lot Help needed standing up from a chair using your arms (e.g., wheelchair or bedside chair)?: A Lot Help needed to walk in hospital room?: Total Help needed climbing 3-5 steps with a railing? 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

## 2021-09-17 NOTE — Progress Notes (Signed)
Inpatient Rehab Admissions Coordinator:  ? ?Per therapy recommendations,  patient was screened for CIR candidacy by Cassia Fein, MS, CCC-SLP. At this time, Pt. Appears to be a a potential candidate for CIR. I will place   order for rehab consult per protocol for full assessment. Please contact me any with questions. ? ?Shaneese Tait, MS, CCC-SLP ?Rehab Admissions Coordinator  ?[phone] (celll) ?[phone] (office) ? ?

## 2021-09-18 ENCOUNTER — Other Ambulatory Visit (HOSPITAL_COMMUNITY): Payer: Self-pay

## 2021-09-18 DIAGNOSIS — E785 Hyperlipidemia, unspecified: Secondary | ICD-10-CM

## 2021-09-18 LAB — CBC
HCT: 26 % — ABNORMAL LOW (ref 36.0–46.0)
Hemoglobin: 7.7 g/dL — ABNORMAL LOW (ref 12.0–15.0)
MCH: 20.4 pg — ABNORMAL LOW (ref 26.0–34.0)
MCHC: 29.6 g/dL — ABNORMAL LOW (ref 30.0–36.0)
MCV: 68.8 fL — ABNORMAL LOW (ref 80.0–100.0)
Platelets: 286 10*3/uL (ref 150–400)
RBC: 3.78 MIL/uL — ABNORMAL LOW (ref 3.87–5.11)
RDW: 23.1 % — ABNORMAL HIGH (ref 11.5–15.5)
WBC: 8.2 10*3/uL (ref 4.0–10.5)
nRBC: 3.2 % — ABNORMAL HIGH (ref 0.0–0.2)

## 2021-09-18 LAB — HEPARIN LEVEL (UNFRACTIONATED): Heparin Unfractionated: 0.44 IU/mL (ref 0.30–0.70)

## 2021-09-18 MED ORDER — ENSURE ENLIVE PO LIQD
237.0000 mL | Freq: Two times a day (BID) | ORAL | Status: DC
  Administered 2021-09-19: 237 mL via ORAL

## 2021-09-18 MED ORDER — DABIGATRAN ETEXILATE MESYLATE 150 MG PO CAPS
150.0000 mg | ORAL_CAPSULE | Freq: Two times a day (BID) | ORAL | Status: DC
  Administered 2021-09-18 – 2021-09-19 (×3): 150 mg via ORAL
  Filled 2021-09-18 (×3): qty 1

## 2021-09-18 NOTE — Progress Notes (Signed)
Inpatient Rehabilitation Admissions Coordinator   I met with patient at bedside and then spoke with her daughter, Lethey/Lia by phone. We discussed goals and expectations of a possible CIR admit. They are in agreement. I will begin Auth with [facility] for possible admit.  Danne Baxter, RN, MSN Rehab Admissions Coordinator [phone] 09/18/2021 12:18 PM

## 2021-09-18 NOTE — Plan of Care (Signed)
Insurance will not cover pradaxa. Start eliquis, discussed with pharmacy. She will need this for 3-6 months or longer. Will need close neurology f/u.

## 2021-09-18 NOTE — TOC Benefit Eligibility Note (Signed)
Patient Teacher, English as a foreign language completed.    The patient is currently admitted and upon discharge could be taking dabigatran (Pradaxa) 150 mg capsules.  Must trial Eliquis and Xarelto first if fail then a Prior Authorization can be done  The patient is insured through [place], [place] Patient [place] Patient Advocate Team Direct Number: [id number]  Fax: [phone]

## 2021-09-18 NOTE — Progress Notes (Signed)
[facility]                                                                               April Reilly, is a 51 y.o. female, DOB - 21-Jun-1971, CHY:[id number] Admit date - 09/10/2021    Outpatient Primary MD for the patient is Pcp, No  LOS - 8  days    Brief summary   [name] is a 51 y.o. female with history of sickle cell,  presenting with altered mental status, decreased LOC, flu like symptoms and possible unwitnessed fall out of bed. CT shows  large region of cortical subcortical edema within the right frontal parietal occipital and temporal lobes as well as right insula and subinsular region with patchy foci of acute parenchymal hemorrhage in the same areas. Concern for an acute, early subacute ischemia versus thrombosis of the venous system. 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 Heparin stroke protocol initiated. Hypertonic saline for cerebral edema. CT scan 2/21 showed progressive increase in midline shift from 9 mm to 11 mm, unchanged hemorrhage in right cerebral hemisphere. Repeat CT venogram and CT angio of the head show mass effect is stable to mildly improved. No new hemorrhage    Assessment & Plan    Assessment and Plan: Acute cerebral venous sinus thrombosis CTA head and neck shows multi focal narrowing in the right > left A1 , right M1 and M2 segments reflecting vasospasm. Hyperdensity and IPH in the right cerebral hemisphere, concerning for infarct with hemorrhagic conversion, with 11 mm of right-to-left midline shift, and effacement of the basal cisterns. CT venogram shows venous sinus thrombosis in the distal right transverse sinus and right sigmoid sinus.  She was started on IV heparin and transitioned to pradaxa 150 mg BID. PA has been sent for discharge.  MRI shows large area of cortical infarction in the right temporal , occipital parietal and posterior frontal lobes with sig  associated edema and remonstrated intra parenchymal hemorrhage.   Neuro surgery and Neurology on board. .  [place] lower extremity is neg for DVT.  Echocardiogram reviewed and wnl.  LDL is 84, A1c is 5.3 . .  CT venogram showed Evolving hemorrhagic infarction primarily involving temporal and occipital lobes with extension into the parietal lobe. No new hemorrhage. Mass effect is stable to mildly improved.   Persistent but improved nonocclusive thrombus within the right distal transverse and sigmoid sinuses.  continue with Pradaxa on discharge. Pharmacy sent PA for Pradaxa.   ICH (intracerebral hemorrhage) ([place])- (present on admission) Repeat CT venogram and CT angio of the head with and without contrast showed Evolving hemorrhagic infarction primarily involving temporal and occipital lobes with extension into the parietal lobe. No new hemorrhage. Mass effect is stable to mildly improved.   Persistent but improved nonocclusive thrombus within the right distal transverse and sigmoid sinuses.    Seizure [facility]) Seizure free.  On keppra 500 mg BID.  LTM discontinued   Essential hypertension- (present on admission) BP parameters are optimal.   Cerebral edema ([facility])- (present on admission) Concern for uncal herniation.  Repeat CT stable on 2/24 stable with 9 mm  shift.  Hypertonic saline  Sodium is wnl. Mass effect on the repeat CT angio of the head and CT venogram is stable to mildly improved.   Sickle cell disease ([place])- (present on admission) No crisis.   Aspiration pneumonia ([place])- (present on admission) completed the course of antibiotics. Pt on RA.   Hyperlipidemia- (present on admission) Resume statin  Iron deficiency anemia Transfuse to keep hemoglobin greater than 7.        Malnutrition Type:  Nutrition Problem: Inadequate oral intake Etiology: dysphagia On dysphagia 2 diet with thin fluids.   Nutrition Interventions:  Interventions: Tube feeding, Prostat, Ensure  Enlive (each supplement provides 350kcal and 20 grams of protein)  Estimated body mass index is 26.72 kg/m as calculated from the following:   Height as of this encounter: 4\' 11"  (1.499 m).   Weight as of this encounter: 60 kg.  Code Status: full code.  DVT Prophylaxis:  Place and maintain sequential compression device Start: 09/10/21 1450 SCD's Start: 09/10/21 1138 dabigatran (PRADAXA) capsule 150 mg   Level of Care: Level of care: Telemetry Medical Family Communication: none at bedside.   Disposition Plan:     Remains inpatient appropriate:  CIR    Procedures:  CT venogram.   Consultants:   PCCM  Neurology.  Neuro surgery.   Antimicrobials:   Anti-infectives (From admission, onward)    Start     Dose/Rate Route Frequency Ordered Stop   09/10/21 1800  acyclovir (ZOVIRAX) 600 mg in dextrose 5 % 100 mL IVPB  Status:  Discontinued        600 mg 112 mL/hr over 60 Minutes Intravenous Every 8 hours 09/10/21 1718 09/11/21 0724   09/10/21 0845  cefTRIAXone (ROCEPHIN) 2 g in sodium chloride 0.9 % 100 mL IVPB  Status:  Discontinued        2 g 200 mL/hr over 30 Minutes Intravenous Every 24 hours 09/10/21 0830 09/11/21 0724   09/10/21 0845  azithromycin (ZITHROMAX) 500 mg in sodium chloride 0.9 % 250 mL IVPB  Status:  Discontinued        500 mg 250 mL/hr over 60 Minutes Intravenous Every 24 hours 09/10/21 0830 09/11/21 0724        Medications  Scheduled Meds:  atorvastatin  40 mg Oral Daily   chlorhexidine  15 mL Mouth Rinse BID   Chlorhexidine Gluconate Cloth  6 each Topical Q0600   dabigatran  150 mg Oral Q12H   fiber  1 packet Oral BID   levETIRAcetam  500 mg Oral BID   mouth rinse  15 mL Mouth Rinse q12n4p   megestrol  40 mg Oral BID   metoprolol tartrate  25 mg Oral BID   PARoxetine  10 mg Oral Daily   senna-docusate  2 tablet Oral QHS   sodium chloride flush  10-40 mL Intracatheter Q12H   Continuous Infusions:   PRN Meds:.acetaminophen **OR** acetaminophen  (TYLENOL) oral liquid 160 mg/5 mL **OR** acetaminophen, butalbital-acetaminophen-caffeine, labetalol, sodium chloride flush, traMADol    Subjective:   April Reilly was seen and examined today. Sleeping comfortably.   Objective:   Vitals:   09/17/21 2335 09/18/21 0314 09/18/21 0454 09/18/21 0937  BP: 99/65 107/74  98/67  Pulse: 95 (!) 101  (!) 105  Resp: 16   20  Temp: 98.5 F (36.9 C) 98.6 F (37 C)  98.5 F (36.9 C)  TempSrc: Oral Oral  Oral  SpO2: 97% 96%    Weight:   60 kg   Height:  Intake/Output Summary (Last 24 hours) at 09/18/2021 1213 Last data filed at 09/18/2021 0100 Gross per 24 hour  Intake 329.17 ml  Output --  Net 329.17 ml   Filed Weights   09/15/21 0444 09/17/21 0500 09/18/21 0454  Weight: 61.4 kg 57.7 kg 60 kg     Exam General exam: Appears calm and comfortable  Respiratory system: Clear to auscultation. Respiratory effort normal. Cardiovascular system: S1 & S2 heard, RRR. No JVD, murmurs, rubs, gallops or clicks. No pedal edema. Gastrointestinal system: Abdomen is nondistended, soft and nontender. Normal bowel sounds heard. Central nervous system: Alert and oriented. No focal neurological deficits. Extremities: Symmetric 5 x 5 power. Skin: No rashes, lesions or ulcers Psychiatry:  Mood & affect appropriate.    Data Reviewed:  I have personally reviewed following labs and imaging studies   CBC Lab Results  Component Value Date   WBC 8.2 09/18/2021   RBC 3.78 (L) 09/18/2021   HGB 7.7 (L) 09/18/2021   HCT 26.0 (L) 09/18/2021   MCV 68.8 (L) 09/18/2021   MCH 20.4 (L) 09/18/2021   PLT 286 09/18/2021   MCHC 29.6 (L) 09/18/2021   RDW 23.1 (H) 09/18/2021   LYMPHSABS 0.9 09/10/2021   MONOABS 0.7 09/10/2021   EOSABS 0.0 09/10/2021   BASOSABS 0.0 05/15/8526     Last metabolic panel Lab Results  Component Value Date   NA 142 09/16/2021   K 3.5 09/16/2021   CL 110 09/16/2021   CO2 23 09/16/2021   BUN 10 09/16/2021   CREATININE  0.65 09/16/2021   GLUCOSE 117 (H) 09/16/2021   GFRNONAA >60 09/16/2021   CALCIUM 9.1 09/16/2021   PHOS 3.9 09/12/2021   PROT 8.1 09/10/2021   ALBUMIN 2.6 (L) 09/16/2021   BILITOT 1.2 09/10/2021   ALKPHOS 58 09/10/2021   AST 30 09/10/2021   ALT 19 09/10/2021   ANIONGAP 9 09/16/2021    CBG (last 3)  Recent Labs    09/16/21 0433 09/16/21 0819 09/16/21 1150  GLUCAP 109* 112* 125*      Coagulation Profile: No results for input(s): INR, PROTIME in the last 168 hours.    Radiology Studies: CT ANGIO HEAD W OR WO CONTRAST  Result Date: 09/17/2021 CLINICAL DATA:  Stroke, follow-up, intracranial hemorrhage and dural sinus thrombosis EXAM: CT ANGIOGRAPHY HEAD CT VENTOGRAM HEAD TECHNIQUE: Multidetector CT imaging of the head was performed using the standard protocol before and during bolus administration of intravenous contrast. Imaging was repeated in the delayed venous phase. Multiplanar CT image reconstructions and MIPs were obtained to evaluate the vascular anatomy. RADIATION DOSE REDUCTION: This exam was performed according to the departmental dose-optimization program which includes automated exposure control, adjustment of the mA and/or kV according to patient size and/or use of iterative reconstruction technique. CONTRAST:  66mL OMNIPAQUE IOHEXOL 350 MG/ML SOLN COMPARISON:  Recent CT and MR imaging FINDINGS: CT HEAD Brain: Evolving hemorrhagic infarction is again identified with edema and patchy hemorrhage again noted in the right parietal, temporal, and occipital lobes extending to involve deep white matter tracts. Mass effect is stable to mildly improved with persistent subcentimeter leftward midline shift. No hydrocephalus. No new loss of gray-white differentiation. Vascular: Better evaluated on CTA and CTV. Skull: Calvarium is unremarkable. Sinuses/Orbits: No acute finding. Other: None. CTA HEAD Anterior circulation: Intracranial internal carotid arteries are patent. Anterior and  middle cerebral arteries are patent. Posterior circulation: Intracranial vertebral arteries, basilar artery, and posterior cerebral arteries are patent. Review of the MIP images confirms the above findings. CTV  HEAD Superior sagittal sinus is patent. Straight sinus, vein of Galen, and internal cerebral veins are patent. Left transverse and sigmoid sinuses are patent. There are persistent filling defects within the right transverse and sigmoid sinuses but opacification is improved compared to the prior study. IMPRESSION: Evolving hemorrhagic infarction primarily involving temporal and occipital lobes with extension into the parietal lobe. No new hemorrhage. Mass effect is stable to mildly improved. Persistent but improved nonocclusive thrombus within the right distal transverse and sigmoid sinuses. Electronically Signed   By: Macy Mis M.D.   On: 09/17/2021 13:14   CT VENOGRAM HEAD  Result Date: 09/17/2021 CLINICAL DATA:  Stroke, follow-up, intracranial hemorrhage and dural sinus thrombosis EXAM: CT ANGIOGRAPHY HEAD CT VENTOGRAM HEAD TECHNIQUE: Multidetector CT imaging of the head was performed using the standard protocol before and during bolus administration of intravenous contrast. Imaging was repeated in the delayed venous phase. Multiplanar CT image reconstructions and MIPs were obtained to evaluate the vascular anatomy. RADIATION DOSE REDUCTION: This exam was performed according to the departmental dose-optimization program which includes automated exposure control, adjustment of the mA and/or kV according to patient size and/or use of iterative reconstruction technique. CONTRAST:  86mL OMNIPAQUE IOHEXOL 350 MG/ML SOLN COMPARISON:  Recent CT and MR imaging FINDINGS: CT HEAD Brain: Evolving hemorrhagic infarction is again identified with edema and patchy hemorrhage again noted in the right parietal, temporal, and occipital lobes extending to involve deep white matter tracts. Mass effect is stable to  mildly improved with persistent subcentimeter leftward midline shift. No hydrocephalus. No new loss of gray-white differentiation. Vascular: Better evaluated on CTA and CTV. Skull: Calvarium is unremarkable. Sinuses/Orbits: No acute finding. Other: None. CTA HEAD Anterior circulation: Intracranial internal carotid arteries are patent. Anterior and middle cerebral arteries are patent. Posterior circulation: Intracranial vertebral arteries, basilar artery, and posterior cerebral arteries are patent. Review of the MIP images confirms the above findings. CTV HEAD Superior sagittal sinus is patent. Straight sinus, vein of Galen, and internal cerebral veins are patent. Left transverse and sigmoid sinuses are patent. There are persistent filling defects within the right transverse and sigmoid sinuses but opacification is improved compared to the prior study. IMPRESSION: Evolving hemorrhagic infarction primarily involving temporal and occipital lobes with extension into the parietal lobe. No new hemorrhage. Mass effect is stable to mildly improved. Persistent but improved nonocclusive thrombus within the right distal transverse and sigmoid sinuses. Electronically Signed   By: Macy Mis M.D.   On: 09/17/2021 13:14       Hosie Poisson M.D. [facility] 09/18/2021, 12:13 PM  Available via Epic secure chat 7am-7pm After 7 pm, please refer to night coverage provider listed on amion.

## 2021-09-18 NOTE — Progress Notes (Signed)
Nutrition Follow-up  DOCUMENTATION CODES:  Not applicable  INTERVENTION:  -Ensure Enlive po BID, each supplement provides 350 kcal and 20 grams of protein. -MVI with minerals daily -d/c nutrisource fiber  NUTRITION DIAGNOSIS:  Inadequate oral intake related to dysphagia as evidenced by  (altered texture diet). -- ongoing  GOAL:  Patient will meet greater than or equal to 90% of their needs  -- progressing  MONITOR:  PO intake, Supplement acceptance  REASON FOR ASSESSMENT:  Consult Enteral/tube feeding initiation and management  ASSESSMENT:  Pt with PMH of sickle cell trait, heavy vaginal bleeding, anemia admitted after AMS for 3 days with large R frontoparietal infarct with hemorrhagic conversion and cerebral edema.  2/20 - cortrak placed 2/23 - diet advanced to dysphagia 2 with nectar thick liquids 2/25 - diet advanced to dysphagia 2 with thin liquids; cortrak removed   Pt pending possible d/c to CIR. Pt tolerating dysphagia 2 diet. SLP following. Plan to provide ONS to provide additional kcals/protein  PO Intake: 0-50% x 3 recorded meals (33% avg intake)   UOP: 4x unmeasured occurrences x24 hours I/O: +1482ml since admit  Current weight: 60 kg Admit weight: 66.6 kg   Medications: nutrisource fiber, keppra, megace, senokot-s Labs reviewed.  Diet Order:   Diet Order             DIET DYS 2 Room service appropriate? Yes; Fluid consistency: Thin  Diet effective now                  EDUCATION NEEDS:  Not appropriate for education at this time  Skin:  Skin Assessment: Reviewed RN Assessment  Last BM:  2/27  Height:  Ht Readings from Last 1 Encounters:  09/10/21 4\' 11"  (1.499 m)   Weight:  Wt Readings from Last 1 Encounters:  09/18/21 60 kg   BMI:  Body mass index is 26.72 kg/m.  Estimated Nutritional Needs:  Kcal:  1600-1800 Protein:  80-95 grams Fluid:  > 1.5 L/day    Theone Stanley., MS, RD, LDN (she/her/hers) RD pager number and weekend/on-call  pager number located in [place].

## 2021-09-18 NOTE — Progress Notes (Signed)
Physical Therapy Treatment Patient Details Name: April Reilly MRN: [id number] DOB: Dec 31, 1970 Today's Date: 09/18/2021   History of Present Illness 51 y.o. female presents to [facility] hospital on 09/10/2021 after fall out of bed followed by AMS. CTH with significant edema in R frontal, parietal, occipital and temporal lobe with superimposed hemorrhage, started on hypertonic saline solution. PMH includes sickle cell trait, anemia.    PT Comments    Pt making steady progress towards all goals. Pt desperately wanting to get out of hospital with noted significant impairment to insight to deficits. Pt continues to be easily distracted, has poor safety awareness, L sided inattention, L hemiplegia UE worse than LE, and inability to sequencing tasks without max verbal and tactile cues. Pt however is very motivated to regain independence and very compliant with therapy. After focusing on standing and engaging L LE muscles pt was able to complete step pvt to the chair without L knee buckling today. Pt remains to have difficulty using L UE functionally. I suspect pt with some vision deficits on the L as well as pt had a hard time seeing handle on the back of the chair on the L side. Continue to strongly recommend [facility] Upon d/c for aggressive rehab program for maximal functional recovery and decreased burden on family. Acute PT to cont to follow.    Recommendations for follow up therapy are one component of a multi-disciplinary discharge planning process, led by the attending physician.  Recommendations may be updated based on patient status, additional functional criteria and insurance authorization.  Follow Up Recommendations  Acute inpatient rehab (3hours/day)     Assistance Recommended at Discharge Frequent or constant Supervision/Assistance  Patient can return home with the following Two people to help with walking and/or transfers;Two people to help with bathing/dressing/bathroom;Assistance with  cooking/housework;Assistance with feeding;Direct supervision/assist for medications management;Direct supervision/assist for financial management;Assist for transportation;Help with stairs or ramp for entrance   Equipment Recommendations  Wheelchair (measurements PT);Wheelchair cushion (measurements PT)    Recommendations for Other Services Rehab consult     Precautions / Restrictions Precautions Precautions: Fall Precaution Comments: L sided inattention Restrictions Weight Bearing Restrictions: No     Mobility  Bed Mobility Overal bed mobility: Needs Assistance Bed Mobility: Supine to Sit     Supine to sit: HOB elevated, Mod assist     General bed mobility comments: pt laying diagonally in bed with legs out the side below bed rail. with assist of PT pt initiated pulling self up to EOB, PT in front to prevent sliding off bed    Transfers Overall transfer level: Needs assistance Equipment used: 2 person hand held assist Transfers: Sit to/from Stand, Bed to chair/wheelchair/BSC Sit to Stand: +2 physical assistance, Mod assist Stand pivot transfers: Mod assist, +2 physical assistance Step pivot transfers: +2 physical assistance, Mod assist       General transfer comment: completed many sit to stand up to back of recliner. Tactile cues by PT at knee and posterior hip to engage muscles as pt with L sided neglect. pt with good power up, modA once instanding to prevent L knee buckling. by 4th stand pt able to stand and perform lateral weight shift L/R without L knee buckling, tactile cues at chest to stand up right. pt able to complete step to pvt to chair with max directional sequencing cues, no L knee buckling and min/modAx2    Ambulation/Gait               General Gait Details: worked  on marching in place while holding onto back of chair   Stairs             Wheelchair Mobility    Modified Rankin (Stroke Patients Only) Modified Rankin (Stroke Patients  Only) Pre-Morbid Rankin Score: No symptoms Modified Rankin: Severe disability     Balance Overall balance assessment: Needs assistance Sitting-balance support: Feet supported Sitting balance-Leahy Scale: Poor Sitting balance - Comments: pt initially maxA at EOB with strong L lateral lean, pt progressed to close min G and was able to maintain midline much better today with verbal cues Postural control: Left lateral lean Standing balance support: Bilateral upper extremity supported Standing balance-Leahy Scale: Poor Standing balance comment: mod/maxA                            Cognition Arousal/Alertness: Awake/alert Behavior During Therapy: WFL for tasks assessed/performed Overall Cognitive Status: Impaired/Different from baseline Area of Impairment: Attention, Memory, Following commands, Safety/judgement, Awareness, Problem solving                 Orientation Level: Disoriented to, Time Current Attention Level: Focused (easily distracted, tangential) Memory: Decreased short-term memory, Decreased recall of precautions Following Commands: Follows one step commands consistently Safety/Judgement: Decreased awareness of deficits, Decreased awareness of safety (L sided neglect) Awareness: Emergent Problem Solving: Slow processing, Decreased initiation, Requires verbal cues, Difficulty sequencing, Requires tactile cues General Comments: Pt alert and oriented however continues with severely impaired insight to deficits and remain unrealistic of her functional capabilities at this time. Pt easily distracted and forgets task at hand required freq verbal cues, difficulty processing/sequencing how to do tasks with R hand only        Exercises Other Exercises Other Exercises: worked on standing and engaging L quad and gluts with PT sitting in chair to patient L while pt sitting EOB with recliner chair turned backwards for pt to hold ontox 4 trials. Pt requiring constant  verbal and tactile cues but eventually was able to stand without tactile cues from PT, just support at the trunk. L UE also very weak and limited on ability to support self with L UE Other Exercises: marching while in standing, 10 reps each leg, 2 sets with mod/maxA at L side when in stance phase to prevent buckling    General Comments General comments (skin integrity, edema, etc.): VSS on RA      Pertinent Vitals/Pain Pain Assessment Pain Assessment: No/denies pain    Home Living                          Prior Function            PT Goals (current goals can now be found in the care plan section) Acute Rehab PT Goals PT Goal Formulation: Patient unable to participate in goal setting Time For Goal Achievement: 09/26/21 Potential to Achieve Goals: Fair Progress towards PT goals: Progressing toward goals    Frequency    Min 4X/week      PT Plan Current plan remains appropriate    Co-evaluation              AM-PAC PT "6 Clicks" Mobility   Outcome Measure  Help needed turning from your back to your side while in a flat bed without using bedrails?: A Lot Help needed moving from lying on your back to sitting on the side of a flat bed without using bedrails?: A  Lot Help needed moving to and from a bed to a chair (including a wheelchair)?: A Lot Help needed standing up from a chair using your arms (e.g., wheelchair or bedside chair)?: A Lot Help needed to walk in hospital room?: Total Help needed climbing 3-5 steps with a railing? 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

## 2021-09-18 NOTE — Consult Note (Addendum)
° °  [facility] Inpatient Consult   09/18/2021  Giamarie Bueche 02-Feb-1971 [id number]   [place] Organization [ACO] Patient:  [place] plan  Primary Care Provider:  Pcp, No  Patient reviewed for [facility] employee plan support with [place] [employer] services for post hospital follow up needs.  Plan: Referral for [facility] plan for disease management and community resource support can be made pending disposition from acute hospital. Reviewed PT/OT recommendations. However, patient is being recommended for inpatient rehabilitation level of care for transitional needs. Will continue to follow progress and post hospital follow up needs.  11:20 am Came by to check with patient regarding PCP needs. Spoke with patient's nurse and states patient maybe sleeping had been up with therapy.  Patient asleep on rounds. Continue to follow.  For additional questions or referrals please contact:   Natividad Brood, RN BSN [facility] Liaison  [phone] business mobile phone Toll free office [phone]  Fax number: [phone] [name]

## 2021-09-18 NOTE — TOC Benefit Eligibility Note (Signed)
Patient Advocate Encounter   Received notification that prior authorization for dabigatran (Pradaxa) 150 mg capsules is required.   PA submitted on 09/18/2021 Key [id number] Status is pending       Lyndel Safe, [place] Patient Advocate Specialist [place] Patient Advocate Team Direct Number: [phone]  Fax: [phone]

## 2021-09-18 NOTE — Progress Notes (Signed)
SLP Cancellation Note  Patient Details Name: April Reilly MRN: [id number] DOB: [birth year]   Cancelled treatment:       Reason Eval/Treat Not Completed: Other (comment) Pamala Hurry from [place] in room with pt, will continue efforts)  Kathleen Lime, MS [facility] SLP [place] [phone] Pager [phone]   Macario Golds 09/18/2021, 11:59 AM

## 2021-09-19 ENCOUNTER — Inpatient Hospital Stay (HOSPITAL_COMMUNITY)
Admission: RE | Admit: 2021-09-19 | Discharge: 2021-09-26 | DRG: 945 | Disposition: A | Discharge status: Home / Self Care | Payer: 59 | Source: Intra-hospital | Attending: Physical Medicine and Rehabilitation | Admitting: Physical Medicine and Rehabilitation

## 2021-09-19 ENCOUNTER — Other Ambulatory Visit (HOSPITAL_COMMUNITY): Payer: Self-pay

## 2021-09-19 ENCOUNTER — Encounter (HOSPITAL_COMMUNITY): Payer: Self-pay | Admitting: Neurology

## 2021-09-19 DIAGNOSIS — Z79818 Long term (current) use of other agents affecting estrogen receptors and estrogen levels: Secondary | ICD-10-CM

## 2021-09-19 DIAGNOSIS — E785 Hyperlipidemia, unspecified: Secondary | ICD-10-CM | POA: Diagnosis present

## 2021-09-19 DIAGNOSIS — D259 Leiomyoma of uterus, unspecified: Secondary | ICD-10-CM

## 2021-09-19 DIAGNOSIS — I69254 Hemiplegia and hemiparesis following other nontraumatic intracranial hemorrhage affecting left non-dominant side: Secondary | ICD-10-CM

## 2021-09-19 DIAGNOSIS — G08 Intracranial and intraspinal phlebitis and thrombophlebitis: Secondary | ICD-10-CM | POA: Diagnosis present

## 2021-09-19 DIAGNOSIS — D62 Acute posthemorrhagic anemia: Secondary | ICD-10-CM | POA: Diagnosis present

## 2021-09-19 DIAGNOSIS — G4459 Other complicated headache syndrome: Secondary | ICD-10-CM | POA: Diagnosis present

## 2021-09-19 DIAGNOSIS — E871 Hypo-osmolality and hyponatremia: Secondary | ICD-10-CM

## 2021-09-19 DIAGNOSIS — D509 Iron deficiency anemia, unspecified: Secondary | ICD-10-CM | POA: Diagnosis present

## 2021-09-19 DIAGNOSIS — I69298 Other sequelae of other nontraumatic intracranial hemorrhage: Secondary | ICD-10-CM

## 2021-09-19 DIAGNOSIS — D573 Sickle-cell trait: Secondary | ICD-10-CM | POA: Diagnosis present

## 2021-09-19 DIAGNOSIS — G4700 Insomnia, unspecified: Secondary | ICD-10-CM | POA: Diagnosis not present

## 2021-09-19 DIAGNOSIS — E8809 Other disorders of plasma-protein metabolism, not elsewhere classified: Secondary | ICD-10-CM | POA: Diagnosis present

## 2021-09-19 DIAGNOSIS — Z6827 Body mass index (BMI) 27.0-27.9, adult: Secondary | ICD-10-CM

## 2021-09-19 DIAGNOSIS — R000 Tachycardia, unspecified: Secondary | ICD-10-CM | POA: Diagnosis present

## 2021-09-19 DIAGNOSIS — I69291 Dysphagia following other nontraumatic intracranial hemorrhage: Secondary | ICD-10-CM

## 2021-09-19 DIAGNOSIS — R2689 Other abnormalities of gait and mobility: Secondary | ICD-10-CM | POA: Diagnosis present

## 2021-09-19 DIAGNOSIS — R569 Unspecified convulsions: Secondary | ICD-10-CM

## 2021-09-19 DIAGNOSIS — I69212 Visuospatial deficit and spatial neglect following other nontraumatic intracranial hemorrhage: Secondary | ICD-10-CM | POA: Diagnosis not present

## 2021-09-19 DIAGNOSIS — Z713 Dietary counseling and surveillance: Secondary | ICD-10-CM

## 2021-09-19 DIAGNOSIS — I69219 Unspecified symptoms and signs involving cognitive functions following other nontraumatic intracranial hemorrhage: Secondary | ICD-10-CM

## 2021-09-19 DIAGNOSIS — I959 Hypotension, unspecified: Secondary | ICD-10-CM | POA: Diagnosis present

## 2021-09-19 DIAGNOSIS — R1310 Dysphagia, unspecified: Secondary | ICD-10-CM | POA: Diagnosis present

## 2021-09-19 DIAGNOSIS — I10 Essential (primary) hypertension: Secondary | ICD-10-CM | POA: Diagnosis present

## 2021-09-19 DIAGNOSIS — E663 Overweight: Secondary | ICD-10-CM | POA: Diagnosis present

## 2021-09-19 DIAGNOSIS — I69222 Dysarthria following other nontraumatic intracranial hemorrhage: Secondary | ICD-10-CM

## 2021-09-19 DIAGNOSIS — Z7901 Long term (current) use of anticoagulants: Secondary | ICD-10-CM

## 2021-09-19 DIAGNOSIS — R5381 Other malaise: Principal | ICD-10-CM | POA: Diagnosis present

## 2021-09-19 DIAGNOSIS — N938 Other specified abnormal uterine and vaginal bleeding: Secondary | ICD-10-CM

## 2021-09-19 DIAGNOSIS — Z79891 Long term (current) use of opiate analgesic: Secondary | ICD-10-CM

## 2021-09-19 DIAGNOSIS — R7989 Other specified abnormal findings of blood chemistry: Secondary | ICD-10-CM

## 2021-09-19 DIAGNOSIS — I619 Nontraumatic intracerebral hemorrhage, unspecified: Secondary | ICD-10-CM

## 2021-09-19 DIAGNOSIS — J690 Pneumonitis due to inhalation of food and vomit: Secondary | ICD-10-CM

## 2021-09-19 MED ORDER — JUVEN PO PACK
1.0000 | PACK | Freq: Two times a day (BID) | ORAL | Status: DC
  Administered 2021-09-20 – 2021-09-26 (×11): 1 via ORAL
  Filled 2021-09-19 (×13): qty 1

## 2021-09-19 MED ORDER — TRAMADOL HCL 50 MG PO TABS
25.0000 mg | ORAL_TABLET | Freq: Four times a day (QID) | ORAL | Status: DC | PRN
  Administered 2021-09-20 – 2021-09-23 (×4): 25 mg via ORAL
  Filled 2021-09-19 (×4): qty 1

## 2021-09-19 MED ORDER — APIXABAN 5 MG PO TABS: 5.0000 mg | ORAL_TABLET | Freq: Two times a day (BID) | ORAL | Status: DC

## 2021-09-19 MED ORDER — METOPROLOL TARTRATE 25 MG PO TABS: 25.0000 mg | ORAL_TABLET | Freq: Two times a day (BID) | ORAL | Status: DC

## 2021-09-19 MED ORDER — PROCHLORPERAZINE EDISYLATE 10 MG/2ML IJ SOLN: 5.0000 mg | Freq: Four times a day (QID) | INTRAMUSCULAR | Status: DC | PRN

## 2021-09-19 MED ORDER — BISACODYL 10 MG RE SUPP: 10.0000 mg | Freq: Every day | RECTAL | Status: DC | PRN

## 2021-09-19 MED ORDER — CHLORHEXIDINE GLUCONATE CLOTH 2 % EX PADS: 6.0000 | MEDICATED_PAD | Freq: Every day | CUTANEOUS | Status: DC

## 2021-09-19 MED ORDER — TRAZODONE HCL 50 MG PO TABS
25.0000 mg | ORAL_TABLET | Freq: Every evening | ORAL | Status: DC | PRN
  Administered 2021-09-20 – 2021-09-22 (×3): 50 mg via ORAL
  Administered 2021-09-23: 25 mg via ORAL
  Filled 2021-09-19 (×4): qty 1

## 2021-09-19 MED ORDER — CHLORHEXIDINE GLUCONATE 0.12 % MT SOLN
15.0000 mL | Freq: Two times a day (BID) | OROMUCOSAL | Status: DC
  Administered 2021-09-19 – 2021-09-26 (×12): 15 mL via OROMUCOSAL
  Filled 2021-09-19 (×12): qty 15

## 2021-09-19 MED ORDER — LEVETIRACETAM 500 MG PO TABS: 500.0000 mg | ORAL_TABLET | Freq: Two times a day (BID) | ORAL | Status: DC

## 2021-09-19 MED ORDER — APIXABAN 5 MG PO TABS
5.0000 mg | ORAL_TABLET | Freq: Two times a day (BID) | ORAL | Status: DC
  Administered 2021-09-19 – 2021-09-26 (×14): 5 mg via ORAL
  Filled 2021-09-19 (×14): qty 1

## 2021-09-19 MED ORDER — ACETAMINOPHEN 325 MG PO TABS
325.0000 mg | ORAL_TABLET | ORAL | Status: DC | PRN
  Administered 2021-09-21 – 2021-09-26 (×2): 650 mg via ORAL
  Filled 2021-09-19 (×2): qty 2

## 2021-09-19 MED ORDER — POLYETHYLENE GLYCOL 3350 17 G PO PACK: 17.0000 g | PACK | Freq: Every day | ORAL | Status: DC | PRN

## 2021-09-19 MED ORDER — SENNOSIDES-DOCUSATE SODIUM 8.6-50 MG PO TABS: 2.0000 | ORAL_TABLET | Freq: Every day | ORAL | Status: DC

## 2021-09-19 MED ORDER — ALUM & MAG HYDROXIDE-SIMETH 200-200-20 MG/5ML PO SUSP: 30.0000 mL | ORAL | Status: DC | PRN

## 2021-09-19 MED ORDER — SENNOSIDES-DOCUSATE SODIUM 8.6-50 MG PO TABS
2.0000 | ORAL_TABLET | Freq: Every day | ORAL | Status: DC
  Administered 2021-09-19 – 2021-09-25 (×5): 2 via ORAL
  Filled 2021-09-19 (×6): qty 2

## 2021-09-19 MED ORDER — PAROXETINE HCL 10 MG PO TABS
10.0000 mg | ORAL_TABLET | Freq: Every day | ORAL | Status: DC
  Administered 2021-09-20 – 2021-09-26 (×7): 10 mg via ORAL
  Filled 2021-09-19 (×7): qty 1

## 2021-09-19 MED ORDER — ATORVASTATIN CALCIUM 40 MG PO TABS: 40.0000 mg | ORAL_TABLET | Freq: Every day | ORAL | Status: DC

## 2021-09-19 MED ORDER — ENSURE MAX PROTEIN PO LIQD
11.0000 [oz_av] | Freq: Two times a day (BID) | ORAL | Status: DC
  Administered 2021-09-19 – 2021-09-26 (×13): 11 [oz_av] via ORAL

## 2021-09-19 MED ORDER — METOPROLOL TARTRATE 25 MG PO TABS
25.0000 mg | ORAL_TABLET | Freq: Two times a day (BID) | ORAL | Status: DC
  Administered 2021-09-19 – 2021-09-20 (×2): 25 mg via ORAL
  Filled 2021-09-19 (×2): qty 1

## 2021-09-19 MED ORDER — DIPHENHYDRAMINE HCL 12.5 MG/5ML PO ELIX: 12.5000 mg | ORAL_SOLUTION | Freq: Four times a day (QID) | ORAL | Status: DC | PRN

## 2021-09-19 MED ORDER — LEVETIRACETAM 500 MG PO TABS
500.0000 mg | ORAL_TABLET | Freq: Two times a day (BID) | ORAL | Status: DC
  Administered 2021-09-19 – 2021-09-26 (×14): 500 mg via ORAL
  Filled 2021-09-19 (×14): qty 1

## 2021-09-19 MED ORDER — ATORVASTATIN CALCIUM 40 MG PO TABS
40.0000 mg | ORAL_TABLET | Freq: Every day | ORAL | Status: DC
  Administered 2021-09-20 – 2021-09-26 (×7): 40 mg via ORAL
  Filled 2021-09-19 (×7): qty 1

## 2021-09-19 MED ORDER — BUTALBITAL-APAP-CAFFEINE 50-325-40 MG PO TABS
1.0000 | ORAL_TABLET | Freq: Four times a day (QID) | ORAL | Status: DC | PRN
  Administered 2021-09-20 – 2021-09-25 (×15): 1 via ORAL
  Filled 2021-09-19 (×17): qty 1

## 2021-09-19 MED ORDER — PROCHLORPERAZINE 25 MG RE SUPP: 12.5000 mg | Freq: Four times a day (QID) | RECTAL | Status: DC | PRN

## 2021-09-19 MED ORDER — TRAMADOL HCL 50 MG PO TABS: 25.0000 mg | ORAL_TABLET | Freq: Four times a day (QID) | ORAL | Status: DC | PRN

## 2021-09-19 MED ORDER — GUAIFENESIN-DM 100-10 MG/5ML PO SYRP: 5.0000 mL | ORAL_SOLUTION | Freq: Four times a day (QID) | ORAL | Status: DC | PRN

## 2021-09-19 MED ORDER — FLEET ENEMA 7-19 GM/118ML RE ENEM: 1.0000 | ENEMA | Freq: Once | RECTAL | Status: DC | PRN

## 2021-09-19 MED ORDER — PROCHLORPERAZINE MALEATE 5 MG PO TABS: 5.0000 mg | ORAL_TABLET | Freq: Four times a day (QID) | ORAL | Status: DC | PRN

## 2021-09-19 MED ORDER — ORAL CARE MOUTH RINSE
15.0000 mL | Freq: Two times a day (BID) | OROMUCOSAL | Status: DC
  Administered 2021-09-20 – 2021-09-26 (×8): 15 mL via OROMUCOSAL

## 2021-09-19 MED ORDER — MEGESTROL ACETATE 40 MG PO TABS
40.0000 mg | ORAL_TABLET | Freq: Two times a day (BID) | ORAL | Status: DC
  Administered 2021-09-20 – 2021-09-24 (×9): 40 mg via ORAL
  Filled 2021-09-19 (×10): qty 1

## 2021-09-19 MED ORDER — PAROXETINE HCL 10 MG PO TABS: 10.0000 mg | ORAL_TABLET | Freq: Every day | ORAL | Status: DC

## 2021-09-19 NOTE — Progress Notes (Signed)
Physical Therapy Treatment ?Patient Details ?Name: April Reilly ?MRN: [id number] ?DOB: 1971/07/05 ?Today's Date: 09/19/2021 ? ? ?History of Present Illness 51 y.o. female presents to [facility] hospital on 09/10/2021 after fall out of bed followed by AMS. CTH with significant edema in R frontal, parietal, occipital and temporal lobe with superimposed hemorrhage, started on hypertonic saline solution. PMH includes sickle cell trait, anemia. ? ?  ?PT Comments  ? ? Pt shows big changes since last session with much greater L side functioning.  Emphasis on transition to EOB, sit to stand safety,  balance activity at EOB prior to mobility, gait training while making way to bathroom with stress on stability and w/shift without list L.  Also worked on standing balance/stamina at sink to clean up after having a stool. ?   ?Recommendations for follow up therapy are one component of a multi-disciplinary discharge planning process, led by the attending physician.  Recommendations may be updated based on patient status, additional functional criteria and insurance authorization. ? ?Follow Up Recommendations ? Acute inpatient rehab (3hours/day) ?  ?  ?Assistance Recommended at Discharge Frequent or constant Supervision/Assistance  ?Patient can return home with the following A little help with walking and/or transfers;A little help with bathing/dressing/bathroom;Assistance with cooking/housework;Assistance with feeding;Assist for transportation;Help with stairs or ramp for entrance ?  ?Equipment Recommendations ? Other (comment) (TBA)  ?  ?Recommendations for Other Services   ? ? ?  ?Precautions / Restrictions Precautions ?Precautions: Fall ?Precaution Comments: L sided inattention ?Restrictions ?Weight Bearing Restrictions: No  ?  ? ?Mobility ? Bed Mobility ?Overal bed mobility: Needs Assistance ?Bed Mobility: Supine to Sit, Sit to Supine ?  ?  ?Supine to sit: Min assist ?Sit to supine: Min assist ?  ?  ?  ? ?Transfers ?Overall transfer  level: Needs assistance ?  ?Transfers: Sit to/from Stand ?Sit to Stand: Min assist, Mod assist ?  ?  ?  ?  ?  ?General transfer comment: min initally, mod with increased fatigue with more support required on L ?  ? ?Ambulation/Gait ?Ambulation/Gait assistance: Min assist, Mod assist ?Gait Distance (Feet): 15 Feet (x2) ?Assistive device: 1 person hand held assist, None ?Gait Pattern/deviations: Step-through pattern ?  ?Gait velocity interpretation: <1.31 ft/sec, indicative of household ambulator ?  ?General Gait Details: generally unsteady with degradation due to fatigue.  lean to the L when needing to turn L.  Pt deferred increasing distance. ? ? ?Stairs ?  ?  ?  ?  ?  ? ? ?Wheelchair Mobility ?  ? ?Modified Rankin (Stroke Patients Only) ?Modified Rankin (Stroke Patients Only) ?Modified Rankin: Moderately severe disability ? ? ?  ?Balance Overall balance assessment: Needs assistance ?Sitting-balance support: Feet supported ?Sitting balance-Leahy Scale: Fair ?  ?  ?  ?Standing balance-Leahy Scale: Fair ?Standing balance comment: fair initially and progressed to poor with fatigue.  pt able to maintain  balance at sink during peri care and gown chain, but needed to hold to the sink more as she fatigued. ?  ?  ?  ?  ?  ?  ?  ?  ?  ?  ?  ?  ? ?  ?Cognition Arousal/Alertness: Awake/alert ?Behavior During Therapy: Impulsive ?Overall Cognitive Status: Impaired/Different from baseline ?Area of Impairment: Safety/judgement, Attention, Awareness, Problem solving, Following commands ?  ?  ?  ?  ?  ?  ?  ?  ?  ?Current Attention Level: Focused ?Memory: Decreased short-term memory, Decreased recall of precautions ?Following Commands: Follows one step commands consistently ?  Safety/Judgement: Decreased awareness of deficits, Decreased awareness of safety ?Awareness: Emergent ?Problem Solving: Slow processing, Decreased initiation, Requires verbal cues, Difficulty sequencing, Requires tactile cues ?General Comments: alert &  oriented. easily distracted internally and by environmental distractions, often tangential and requires re-driection to the task. Able to follow simple one step cues but unable to carry out multi-step directional task. ?  ?  ? ?  ?Exercises   ? ?  ?General Comments General comments (skin integrity, edema, etc.): vss ?  ?  ? ?Pertinent Vitals/Pain Pain Assessment ?Pain Assessment: No/denies pain ?Pain Intervention(s): Monitored during session  ? ? ?Home Living   ?  ?  ?  ?  ?  ?  ?  ?  ?  ?   ?  ?Prior Function    ?  ?  ?   ? ?PT Goals (current goals can now be found in the care plan section) Acute Rehab PT Goals ?PT Goal Formulation: Patient unable to participate in goal setting ?Time For Goal Achievement: 09/26/21 ?Potential to Achieve Goals: Fair ?Progress towards PT goals: Progressing toward goals ? ?  ?Frequency ? ? ? Min 4X/week ? ? ? ?  ?PT Plan Current plan remains appropriate  ? ? ?Co-evaluation PT/OT/SLP Co-Evaluation/Treatment: Yes ?Reason for Co-Treatment: Complexity of the patient's impairments (multi-system involvement) ?PT goals addressed during session: Mobility/safety with mobility ?  ?  ? ?  ?AM-PAC PT "6 Clicks" Mobility   ?Outcome Measure ? Help needed turning from your back to your side while in a flat bed without using bedrails?: A Little ?Help needed moving from lying on your back to sitting on the side of a flat bed without using bedrails?: A Little ?Help needed moving to and from a bed to a chair (including a wheelchair)?: A Lot ?Help needed standing up from a chair using your arms (e.g., wheelchair or bedside chair)?: A Lot ?Help needed to walk in hospital room?: A Lot ?Help needed climbing 3-5 steps with a railing? : A Lot ?6 Click Score: 14 ? ?  ?End of Session   ?Activity Tolerance: Patient limited by fatigue;Patient tolerated treatment well ?Patient left: with call bell/phone within reach;in bed;with bed alarm set ?Nurse Communication: Mobility status ?PT Visit Diagnosis: Other  abnormalities of gait and mobility (R26.89);Other symptoms and signs involving the nervous system (R29.898) ?  ? ? ?Time: 1340-1404 ?PT Time Calculation (min) (ACUTE ONLY): 24 min ? ?Charges:  $Gait Training: 8-22 mins          ?          ? ?09/19/2021 ? ?Ginger Carne., PT ?Acute Rehabilitation Services ?[phone]  (pager) ?[phone]  (office) ? ? ?Tessie Fass Almadelia Looman ?09/19/2021, 4:11 PM ? ?

## 2021-09-19 NOTE — Plan of Care (Signed)
Patient ID: April Reilly, female   DOB: [birth year], 51 y.o.   MRN: [id number] ? ?Problem: Education: ?Goal: Knowledge of General Education information will improve ?Description: Including pain rating scale, medication(s)/side effects and non-pharmacologic comfort measures ?09/19/2021 1507 by Haydee Salter, RN ?Outcome: Adequate for Discharge ?09/19/2021 1223 by Haydee Salter, RN ?Outcome: Adequate for Discharge ?  ?Problem: Health Behavior/Discharge Planning: ?Goal: Ability to manage health-related needs will improve ?09/19/2021 1507 by Haydee Salter, RN ?Outcome: Adequate for Discharge ?09/19/2021 1223 by Haydee Salter, RN ?Outcome: Adequate for Discharge ?  ?Problem: Clinical Measurements: ?Goal: Ability to maintain clinical measurements within normal limits will improve ?09/19/2021 1507 by Haydee Salter, RN ?Outcome: Adequate for Discharge ?09/19/2021 1223 by Haydee Salter, RN ?Outcome: Adequate for Discharge ?Goal: Will remain free from infection ?09/19/2021 1507 by Haydee Salter, RN ?Outcome: Adequate for Discharge ?09/19/2021 1223 by Haydee Salter, RN ?Outcome: Adequate for Discharge ?Goal: Diagnostic test results will improve ?09/19/2021 1507 by Haydee Salter, RN ?Outcome: Adequate for Discharge ?09/19/2021 1223 by Haydee Salter, RN ?Outcome: Adequate for Discharge ?Goal: Respiratory complications will improve ?09/19/2021 1507 by Haydee Salter, RN ?Outcome: Adequate for Discharge ?09/19/2021 1223 by Haydee Salter, RN ?Outcome: Adequate for Discharge ?Goal: Cardiovascular complication will be avoided ?09/19/2021 1507 by Haydee Salter, RN ?Outcome: Adequate for Discharge ?09/19/2021 1223 by Haydee Salter, RN ?Outcome: Adequate for Discharge ?  ?Problem: Activity: ?Goal: Risk for activity intolerance will decrease ?09/19/2021 1507 by Haydee Salter, RN ?Outcome: Adequate for Discharge ?09/19/2021 1223 by Haydee Salter, RN ?Outcome: Adequate for Discharge ?  ?Problem:  Nutrition: ?Goal: Adequate nutrition will be maintained ?09/19/2021 1507 by Haydee Salter, RN ?Outcome: Adequate for Discharge ?09/19/2021 1223 by Haydee Salter, RN ?Outcome: Adequate for Discharge ?  ?Problem: Coping: ?Goal: Level of anxiety will decrease ?09/19/2021 1507 by Haydee Salter, RN ?Outcome: Adequate for Discharge ?09/19/2021 1223 by Haydee Salter, RN ?Outcome: Adequate for Discharge ?  ?Problem: Elimination: ?Goal: Will not experience complications related to bowel motility ?09/19/2021 1507 by Haydee Salter, RN ?Outcome: Adequate for Discharge ?09/19/2021 1223 by Haydee Salter, RN ?Outcome: Adequate for Discharge ?Goal: Will not experience complications related to urinary retention ?09/19/2021 1507 by Haydee Salter, RN ?Outcome: Adequate for Discharge ?09/19/2021 1223 by Haydee Salter, RN ?Outcome: Adequate for Discharge ?  ?Problem: Pain Managment: ?Goal: General experience of comfort will improve ?09/19/2021 1507 by Haydee Salter, RN ?Outcome: Adequate for Discharge ?09/19/2021 1223 by Haydee Salter, RN ?Outcome: Adequate for Discharge ?  ?Problem: Safety: ?Goal: Ability to remain free from injury will improve ?09/19/2021 1507 by Haydee Salter, RN ?Outcome: Adequate for Discharge ?09/19/2021 1223 by Haydee Salter, RN ?Outcome: Adequate for Discharge ?  ?Problem: Skin Integrity: ?Goal: Risk for impaired skin integrity will decrease ?09/19/2021 1507 by Haydee Salter, RN ?Outcome: Adequate for Discharge ?09/19/2021 1223 by Haydee Salter, RN ?Outcome: Adequate for Discharge ?  ?Problem: Education: ?Goal: Knowledge of disease or condition will improve ?09/19/2021 1507 by Haydee Salter, RN ?Outcome: Adequate for Discharge ?09/19/2021 1223 by Haydee Salter, RN ?Outcome: Adequate for Discharge ?Goal: Knowledge of secondary prevention will improve (SELECT ALL) ?09/19/2021 1507 by Haydee Salter, RN ?Outcome: Adequate for Discharge ?09/19/2021 1223 by Haydee Salter,  RN ?Outcome: Adequate for Discharge ?Goal: Knowledge of patient specific risk factors will improve (INDIVIDUALIZE FOR PATIENT) ?09/19/2021 1507 by Haydee Salter, RN ?Outcome: Adequate for  Discharge ?09/19/2021 1223 by Haydee Salter, RN ?Outcome: Adequate for Discharge ?Goal: Individualized Educational Video(s) ?09/19/2021 1507 by Haydee Salter, RN ?Outcome: Adequate for Discharge ?09/19/2021 1223 by Haydee Salter, RN ?Outcome: Adequate for Discharge ?  ?Problem: Coping: ?Goal: Will verbalize positive feelings about self ?09/19/2021 1507 by Haydee Salter, RN ?Outcome: Adequate for Discharge ?09/19/2021 1223 by Haydee Salter, RN ?Outcome: Adequate for Discharge ?  ?Problem: Health Behavior/Discharge Planning: ?Goal: Ability to manage health-related needs will improve ?09/19/2021 1507 by Haydee Salter, RN ?Outcome: Adequate for Discharge ?09/19/2021 1223 by Haydee Salter, RN ?Outcome: Adequate for Discharge ?  ?Problem: Self-Care: ?Goal: Ability to participate in self-care as condition permits will improve ?09/19/2021 1507 by Haydee Salter, RN ?Outcome: Adequate for Discharge ?09/19/2021 1223 by Haydee Salter, RN ?Outcome: Adequate for Discharge ?Goal: Verbalization of feelings and concerns over difficulty with self-care will improve ?09/19/2021 1507 by Haydee Salter, RN ?Outcome: Adequate for Discharge ?09/19/2021 1223 by Haydee Salter, RN ?Outcome: Adequate for Discharge ?Goal: Ability to communicate needs accurately will improve ?09/19/2021 1507 by Haydee Salter, RN ?Outcome: Adequate for Discharge ?09/19/2021 1223 by Haydee Salter, RN ?Outcome: Adequate for Discharge ?  ?Problem: Nutrition: ?Goal: Risk of aspiration will decrease ?09/19/2021 1507 by Haydee Salter, RN ?Outcome: Adequate for Discharge ?09/19/2021 1223 by Haydee Salter, RN ?Outcome: Adequate for Discharge ?Goal: Dietary intake will improve ?09/19/2021 1507 by Haydee Salter, RN ?Outcome: Adequate for  Discharge ?09/19/2021 1223 by Haydee Salter, RN ?Outcome: Adequate for Discharge ?  ?Problem: Ischemic Stroke/TIA Tissue Perfusion: ?Goal: Complications of ischemic stroke/TIA will be minimized ?09/19/2021 1507 by Haydee Salter, RN ?Outcome: Adequate for Discharge ?09/19/2021 1223 by Haydee Salter, RN ?Outcome: Adequate for Discharge ?  ?Problem: Spontaneous Subarachnoid Hemorrhage Tissue Perfusion: ?Goal: Complications of Spontaneous Subarachnoid Hemorrhage will be minimized ?09/19/2021 1507 by Haydee Salter, RN ?Outcome: Adequate for Discharge ?09/19/2021 1223 by Haydee Salter, RN ?Outcome: Adequate for Discharge ? Haydee Salter, RN ? ?

## 2021-09-19 NOTE — H&P (Shared)
Physical Medicine and Rehabilitation Admission H&P    Chief Complaint  Patient presents with   Functional decline     HPI:  April Reilly is a 51 year old female with history of SS trait, uterine fibroids, abnormal vaginal bleeding X 4 months otherwise in relatively good health who was admitted on 09/10/21 with 3 day history of malaise due to flu like symptoms with fall night prior to admission and progressive somnolence. Patient noted to be unresponsive with unequal pupils and dysconjugate gaze. She was started on broad spectrum antibiotics for fever and leucocytosis due to RLL aspiration PNA.  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  She was started on hypertonic saline and EEG done showing evidence of epileptogenicity with  high potential for seizures.    CTA head/heck showed multifocal narrowing in R>L A1, M1 and proximal R-M2 segments likely due to vasospasm and hypertensity and IPH in right cerebral hemispheres concerning for infarct with hemorrhagic conversion. CT venogram done showing subocclusive clot in distal right transverse sinus and right sigmoid dural venous sinus. She was started on IV heparin and neurosurgery contacted for input as MRI brain showed concerns of uncal herniation.  Conservative care recommended with serial CT head which showed increase in midline shift to 11 mm. She was loaded with dilantin and with LT_EEG showing evidence of epileptogenicity and cortical dysfunction with moderate diffuse encephalopathy.Keppra added 02/26 and dilatin discontinued.  2D echo done revealing EF 60-65% with no wall abnormality. Mentation has improved and megestrol resumed on 02/04 for ongoing bleeding due to uterine fibroids.   Hypertonic saline weaned off with improvement in bouts of lethargy and electrolyte abnormalities resolved. Swallow function  improved and she was advanced to regular diet. Repeat CTA/V done on 02/27 showing evolving hemorrhagic infarct with mild improvement in mass effect, no hydrocephalus and persistent but improved nonocclusive thrombus in right distal transverse and sigmoid sinus. Heparin transitioned to Disautel on 02/28 and Neurology recommends 3-6 months of AC as well as close follow up with neurology. She has had issues with headaches managed with Fioricet and low dose  tramadol prn. Blood pressures have been on low side and she continues on metoprolol for management of tachycardia. She continues to have cognitive deficits with disorientation, left sided weakness with balance deficits and left inattention affecting ADLs and mobility. CIR recommended due to functional decline.    Review of Systems  Constitutional:  Negative for fever.  HENT:  Negative for hearing loss.   Eyes:  Negative for blurred vision and double vision.  Cardiovascular:  Negative for chest pain.  Gastrointestinal:  Negative for constipation and heartburn.  Genitourinary:  Negative for frequency.  Musculoskeletal:  Negative for myalgias.  Neurological:  Positive for focal weakness. Negative for dizziness and headaches.  Psychiatric/Behavioral:  Positive for memory loss.     Past Medical History:  Diagnosis Date   Asthma    Vaginal bleeding, abnormal    for 4 months    Past Surgical History:  Procedure Laterality Date   FOOT SURGERY     TUBAL LIGATION      Family History  Problem Relation Age of Onset   Clotting disorder Mother     Social History: Single. Lives alone and works as a Biomedical scientist at [employer] She  reports that she has never smoked. She has never used smokeless tobacco. She reports current  alcohol use-- once a week on average.   She reports that she does not use drugs.   Allergies: No Known Allergies   Medications Prior to Admission  Medication Sig Dispense Refill   ibuprofen (ADVIL) 200 MG tablet Take 200 mg by mouth every 6  (six) hours as needed for mild pain.     megestrol (MEGACE) 40 MG tablet Take 1 tablet (40 mg total) by mouth 2 (two) times daily. 60 tablet 3      Home: Home Living Family/patient expects to be discharged to:: Private residence Living Arrangements: Alone Available Help at Discharge: Family, Available 24 hours/day (4 daughters and sister will rotate and provide 24/7 assist) Type of Home: House Home Access: Stairs to enter [employer] of Steps: 3 Entrance Stairs-Rails: None Home Layout: One level Bathroom Shower/Tub: Government social research officer Accessibility: Yes Additional Comments: pt is a limited historian due to lethargy and dysarthria  Lives With: Alone   Functional History: Prior Function Prior Level of Function : Independent/Modified Independent, Working/employed Mobility Comments: 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 with assist of PT pt initiated pulling self up to EOB, PT in front to prevent sliding off bed Transfers Overall transfer level: Needs assistance Equipment used: 2 person hand held assist Transfers: Sit to/from Stand Sit to Stand: Min assist, Mod assist Bed to/from chair/wheelchair/BSC transfer type:: Step pivot Stand pivot transfers: Mod assist, +2 physical assistance Step pivot transfers: +2 physical assistance, Mod assist General transfer comment: min initally, mod with increased fatigue with more support required on L Ambulation/Gait Ambulation/Gait assistance: Min assist, Mod assist Gait Distance (Feet): 15 Feet (x2) Assistive device: 1 person hand held assist, None Gait  Pattern/deviations: Step-through pattern General Gait Details: generally unsteady with degradation due to fatigue.  lean to the L when needing to turn L.  Pt deferred increasing distance. Gait velocity interpretation: <1.31 ft/sec, indicative of household ambulator    ADL: ADL Overall ADL's : Needs assistance/impaired Grooming: Wash/dry face, Wash/dry hands, Cueing for safety, Cueing for sequencing, Minimal assistance Grooming Details (indicate cue type and reason): at the sink Lower Body Dressing: Moderate assistance, Sit to/from stand, Cueing for safety, Cueing for sequencing, Cueing for compensatory techniques Lower Body Dressing Details (indicate cue type and reason): doffed in standing, abel to balance on one leg with min-mod for L LOB Toilet Transfer: Moderate assistance, Ambulation Toilet Transfer Details (indicate cue type and reason): walked from bed>bathroom wtih min A for L LOB, mod A for toilet transfer as pt attempted to sit too early and had L LOB. unable to correct with cues only Toileting- Water quality scientist and Hygiene: Min guard, Sitting/lateral lean Toileting - Clothing Manipulation Details (indicate cue type and reason): +1 for standing, pt with BUE supported on sink, +1 for hygiene Functional mobility during ADLs: Minimal assistance, Moderate assistance General ADL Comments: Pt initally min A at the start of the session, as she fatigued she progressed to needing mod A  Cognition: Cognition Overall Cognitive Status: Impaired/Different from baseline Arousal/Alertness: Lethargic Orientation Level: Oriented X4 Attention: Focused, Sustained, Selective Focused Attention: Appears intact Sustained Attention: Appears intact Selective Attention: Impaired Selective Attention Impairment: Verbal  basic, Functional basic Memory: Impaired Memory Impairment: Decreased long term memory, Storage deficit Decreased Long Term Memory: Verbal basic Awareness: Impaired Awareness  Impairment: Intellectual impairment, Emergent impairment Problem Solving: Appears intact Executive Function: Self Monitoring, Self Correcting Safety/Judgment: Impaired Cognition Arousal/Alertness: Awake/alert Behavior During Therapy: Impulsive Overall Cognitive Status: Impaired/Different from baseline Area of Impairment: Safety/judgement, Attention, Awareness, Problem solving, Following commands Orientation Level: Disoriented to, Time Current Attention Level: Focused Memory: Decreased short-term memory, Decreased recall of precautions Following Commands: Follows one step commands consistently Safety/Judgement: Decreased awareness of deficits, Decreased awareness of safety Awareness: Emergent Problem Solving: Slow processing, Decreased initiation, Requires verbal cues, Difficulty sequencing, Requires tactile cues General Comments: alert & oriented. easily distracted internally and by environmental distractions, often tangential and requires re-driection to the task. Able to follow simple one step cues but unable to carry out multi-step directional task.   Blood pressure 102/68, pulse 97, temperature 97.6 F (36.4 C), temperature source Oral, resp. rate 16, height 4\' 11"  (1.499 m), weight 60.9 kg, SpO2 97 %. Physical Exam Vitals and nursing note reviewed.  Constitutional:      Appearance: Normal appearance.  Neurological:     Mental Status: She is alert and oriented to person, place, and time.     Comments: Speech clear but distracted by pain. She was able to follow simple motor commands. Had difficulty recalling medical history. LUE weakness noted.     Results for orders placed or performed during the hospital encounter of 09/10/21 (from the past 48 hour(s))  Heparin level (unfractionated)     Status: None   Collection Time: 09/18/21  4:53 AM  Result Value Ref Range   Heparin Unfractionated 0.44 0.30 - 0.70 IU/mL    Comment: (NOTE) The clinical reportable range upper limit is being  lowered to >1.10 to align with the FDA approved guidance for the current laboratory assay.  If heparin results are below expected values, and patient dosage has  been confirmed, suggest follow up testing of antithrombin III levels. Performed at [facility], [place] [address] [place], [place] [address]   CBC     Status: Abnormal   Collection Time: 09/18/21  4:53 AM  Result Value Ref Range   WBC 8.2 4.0 - 10.5 K/uL   RBC 3.78 (L) 3.87 - 5.11 MIL/uL   Hemoglobin 7.7 (L) 12.0 - 15.0 g/dL    Comment: Reticulocyte Hemoglobin testing may be clinically indicated, consider ordering this additional test [id number]    HCT 26.0 (L) 36.0 - 46.0 %   MCV 68.8 (L) 80.0 - 100.0 fL   MCH 20.4 (L) 26.0 - 34.0 pg   MCHC 29.6 (L) 30.0 - 36.0 g/dL   RDW 23.1 (H) 11.5 - 15.5 %   Platelets 286 150 - 400 K/uL   nRBC 3.2 (H) 0.0 - 0.2 %    Comment: Performed at [facility], [place] [address], [place], [place] [address]   No results found.    Blood pressure 102/68, pulse 97, temperature 97.6 F (36.4 C), temperature source Oral, resp. rate 16, height 4\' 11"  (1.499 m), weight 60.9 kg, SpO2 97 %.  Medical Problem List and Plan: 1. Functional deficits secondary to ***  -patient may *** shower  -ELOS/Goals: *** 2.  Antithrombotics: -DVT/anticoagulation:  Pharmaceutical: Other (comment)--Eliquis  -antiplatelet therapy: N/A 3. Pain Management: Continue Fioricet (has been using TID on average).  --May need Topamax as continues to report significant headaches.  4. Mood:  LCSW to follow for evaluation and support.   -  antipsychotic agents: N/a 5. Neuropsych: This patient is not fully  capable of making decisions on his own behalf. 6. Skin/Wound Care: Routine pressure relief measures.  7. Fluids/Electrolytes/Nutrition: Monitor I/O. Check CMET in am. 8. SS trait/ABLA: Menses X 4 months due to bleeding fibroids. Continue to monitor H/H as now on Ac.  9. Concern for Seizure: Continue Keppra 500 mg  bid.  10. Tachycardia: Continue metoprolol bid.   --BP soft in high 90-low 100 range without symptoms.  11. Hyperlipidemia: On Lipitor.        ***  Bary Leriche, PA-C 09/19/2021

## 2021-09-19 NOTE — Progress Notes (Addendum)
Speech Language Pathology Treatment: Dysphagia;Cognitive-Linquistic  ?Patient Details ?Name: Sonal Dorwart ?MRN: [id number] ?DOB: 1971-05-17 ?Today's Date: 09/19/2021 ?Time: 1055-1110 ?SLP Time Calculation (min) (ACUTE ONLY): 15 min ? ?Assessment / Plan / Recommendation ?Clinical Impression ? Pt initially sleeping but awakened easily for treatment session. Pt now masticating regular solids without pocketing or residue; arousal now sustained throughout session which significantly improved function. Will upgrade pt to regular diet and thin liquids. Pt able to verbalize some recent events with improving orientation and memory, but still disoriented to time. Pts safety awareness and ability to problem solve with physical deficits are both still developing. Verbal cues often needed. Pt will need cognitive f/u in AIR. Will sign off for dysphagia.   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 HAs required 3% saline, was never intubated. ?  ?   ?SLP Plan ? Continue with current plan of care ? ?  ?  ?Recommendations for follow up therapy are one component of a multi-disciplinary discharge planning process, led by the attending physician.  Recommendations may be updated based on patient status, additional functional criteria and insurance authorization. ?  ? ?Recommendations  ?Diet recommendations: Regular;Thin liquid ?Liquids provided via: Cup;Straw ?Medication Administration: Whole meds with liquid ?Supervision: Patient able to self feed;Intermittent supervision to cue for compensatory strategies ?Compensations: Minimize environmental  distractions;Monitor for anterior loss;Follow solids with liquid  ?   ?    ?   ? ? ? ? Oral Care Recommendations: Oral care BID ?Follow Up Recommendations: Acute inpatient rehab (3hours/day) ?Assistance recommended at discharge: Frequent or constant Supervision/Assistance ?SLP Visit Diagnosis: Attention and concentration deficit ?Attention and concentration deficit following: Cerebral infarction ?Plan: Continue with current plan of care ? ? ? ? ?  ?  ?Herbie Baltimore, MA CCC-SLP  ?Acute Rehabilitation Services ?Office [phone] ? ? ?Surya Folden, Katherene Ponto ? ?09/19/2021, 11:12 AM ?

## 2021-09-19 NOTE — Assessment & Plan Note (Signed)
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  Continue treatment as below.  Mental status now back to normal baseline. ?

## 2021-09-19 NOTE — PMR Pre-admission (Signed)
PMR Admission Coordinator Pre-Admission Assessment  Patient: April Reilly is an 51 y.o., female MRN: [id number] DOB: [birth year] Height: '4\' 11"'  (149.9 cm) Weight: 60.9 kg  Insurance Information HMO:     PPO:      PCP:      IPA:      80/20:      OTHER:  PRIMARYIval Bible      Policy#: [id number]      Subscriber: pt CM Name: Gasper Lloyd      Phone#: [phone]     Fax#: [phone] Pre-Cert#: [id number] approved until 3/8      Employer: [employer] Benefits:  Phone #: [phone]     Name: 2/28 Eff. Date: 07/22/2021     Deduct: $1500      Out of Pocket Max: $4000      Life Max: none CIR: $500 per admission then 80%      SNF: 80% 120 days Outpatient: 80%     Co-Pay: medical neccesity review after 25 visits Home Health: 80%      Co-Pay: visits per medical neccesity review DME: 80%     Co-Pay: 20% Providers: in network  SECONDARY: none  Financial Counselor:       Phone#:   The [employer] for patients in Inpatient Rehabilitation Facilities with attached Privacy Act [place] Records was provided and verbally reviewed with: N/A  Emergency Contact Information Contact Information     Name Relation Home Work Mobile   [place] Daughter   [phone]   Pricilla Handler   [phone]      Current Medical History  Patient Admitting Diagnosis: CVA  History of Present Illness: 51 year old female with history of sickle cell trait, heavy vaginal bleeding and anemia from uterine fibroids presented on 09/10/21 after an unwitnessed fall out of bed the night before and a 3 day history of flu like symptoms and worsening AMS. CT head showed significant edema in right frontal, parietal , occipital and temporal lobe with superimposed hemorrhage. Neurology consulted.  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 Heparin stroke protocol initiated and hypertonic saline for cerebral edema.  Transitioned to Pradaxa. MRI shows large area of cortical infarction in the right temporal , occipital parietal and posterior frontal lobes with signs associated edema and remonstrated intra parenchymal hemorrhage. [place] lower extremities negative for DVT. ECHO WNL. Repeat CT venogram and CT angio head with and without contrast showed evolving hemorrhagic infarction primarily involving temporal and occipital lobes with extension into the parietal lobe. No new hemorrhage. Mass effect stable and improved. Persistent but improved nonocclusive thrombus within the right distal transverse and sigmoid sinuses. Seizure free and on Keppra.Resume statin for HLD. Iron deficiency anemia and plans to transfuse to keep Hgb greater than 7.  Complete NIHSS TOTAL: 6  Patient[facility] has been reviewed by the rehabilitation admission coordinator and physician.  Past Medical History  Past Medical History:  Diagnosis Date   Asthma    Has the patient had major surgery during 100 days prior to admission? Yes  Family History   Family history is unknown by patient.  Current Medications  Current Facility-Administered Medications:    acetaminophen (TYLENOL) tablet 650 mg, 650 mg, Oral, Q4H PRN, 650 mg at 09/16/21 1745 **OR** acetaminophen (TYLENOL) 160 MG/5ML solution 650 mg, 650 mg, Per Tube, Q4H PRN, 650 mg at 09/15/21 0435 **OR** acetaminophen (TYLENOL) suppository 650 mg, 650 mg, Rectal,  Q4H PRN, Kerney Elbe, MD, 650 mg at 09/10/21 2105   apixaban (ELIQUIS) tablet 5 mg, 5 mg, Oral, BID, [place] Eric J, DO   atorvastatin (LIPITOR) tablet 40 mg, 40 mg, Oral, Daily, Donnamae Jude, RPH, 40 mg at 09/19/21 0851   butalbital-acetaminophen-caffeine (FIORICET) 50-325-40 MG per tablet 1 tablet, 1 tablet, Oral, Q6H PRN, Janine Ores, NP, 1 tablet at 09/18/21 2331   chlorhexidine (PERIDEX) 0.12 % solution 15 mL, 15 mL, Mouth Rinse, BID, Agarwala, Ravi, MD, 15 mL at 09/19/21 2440    Chlorhexidine Gluconate Cloth 2 % PADS 6 each, 6 each, Topical, Q0600, Kerney Elbe, MD, 6 each at 09/19/21 0629   feeding supplement (ENSURE ENLIVE / ENSURE PLUS) liquid 237 mL, 237 mL, Oral, BID BM, Karleen Hampshire, Vijaya, MD   labetalol (NORMODYNE) injection 10 mg, 10 mg, Intravenous, Q10 min PRN, Erick Colace, NP, 10 mg at 09/12/21 1515   levETIRAcetam (KEPPRA) tablet 500 mg, 500 mg, Oral, BID, Palikh, Gaurang M, MD, 500 mg at 09/19/21 0851   MEDLINE mouth rinse, 15 mL, Mouth Rinse, q12n4p, Agarwala, Ravi, MD, 15 mL at 09/16/21 1746   megestrol (MEGACE) tablet 40 mg, 40 mg, Oral, BID, Donnamae Jude, RPH, 40 mg at 09/19/21 0851   metoprolol tartrate (LOPRESSOR) tablet 25 mg, 25 mg, Oral, BID, Donnamae Jude, RPH, 25 mg at 09/19/21 1027   PARoxetine (PAXIL) tablet 10 mg, 10 mg, Oral, Daily, Palikh, Gaurang M, MD, 10 mg at 09/19/21 0851   senna-docusate (Senokot-S) tablet 2 tablet, 2 tablet, Oral, QHS, Donnamae Jude, RPH, 2 tablet at 09/15/21 2235   sodium chloride flush (NS) 0.9 % injection 10-40 mL, 10-40 mL, Intracatheter, Q12H, Kerney Elbe, MD, 10 mL at 09/19/21 2536   sodium chloride flush (NS) 0.9 % injection 10-40 mL, 10-40 mL, Intracatheter, PRN, Kerney Elbe, MD   traMADol Veatrice Bourbon) tablet 25 mg, 25 mg, Oral, Q6H PRN, Amie Portland, MD, 25 mg at 09/18/21 1709  Patients Current Diet:  Diet Order             Diet regular Room service appropriate? Yes; Fluid consistency: Thin  Diet effective now                  Precautions / Restrictions Precautions Precautions: Fall Precaution Comments: L sided inattention Restrictions Weight Bearing Restrictions: No   Has the patient had 2 or more falls or a fall with injury in the past year? 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  Independent  Indoor Mobility: Did the patient need assistance with walking from room to room (with or without device)? Independent  Stairs: Did the patient need assistance with internal or external stairs (with or without device)? Independent  Functional Cognition: Did the patient need help planning regular tasks such as shopping or remembering to take medications? Independent  Patient Information Are you of Hispanic, Latino/a,or Spanish origin?: A. No, not of Hispanic, Latino/a, or Spanish origin What is your race?: B. Black or African American Do you need or want an interpreter to communicate with a doctor or health care staff?: 0. No  Patient's Response To:  Health Literacy and Transportation Is the patient able to respond to health literacy and transportation needs?: Yes Health Literacy - How often do you need to have someone help you when you  read instructions, pamphlets, or other written material from your doctor or pharmacy?: Never In the past 12 months, has lack of transportation kept you from medical appointments or from getting medications?: No In the past 12 months, has lack of transportation kept you from meetings, work, or from getting things needed for daily living?: No  Home Assistive Devices / [place] Devices/Equipment: None  Prior Device Use: Indicate devices/aids used by the patient prior to current illness, exacerbation or injury? None of the above  Current Functional Level Cognition  Arousal/Alertness: Lethargic Overall Cognitive Status: Impaired/Different from baseline Current Attention Level: Focused (easily distracted, tangential) Orientation Level: Oriented X4 Following Commands: Follows one step commands consistently Safety/Judgement: Decreased awareness of deficits, Decreased awareness of safety (L sided neglect) General Comments: Pt alert and oriented however continues with severely impaired insight to deficits and remain unrealistic of her  functional capabilities at this time. Pt easily distracted and forgets task at hand required freq verbal cues, difficulty processing/sequencing how to do tasks with R hand only Attention: Focused, Sustained, Selective Focused Attention: Appears intact Sustained Attention: Appears intact Selective Attention: Impaired Selective Attention Impairment: Verbal basic, Functional basic Memory: Impaired Memory Impairment: Decreased long term memory, Storage deficit Decreased Long Term Memory: Verbal basic Awareness: Impaired Awareness Impairment: Intellectual impairment, Emergent impairment Problem Solving: Appears intact Executive Function: Self Monitoring, Self Correcting Safety/Judgment: Impaired    Extremity Assessment (includes Sensation/Coordination)  Upper Extremity Assessment: LUE deficits/detail LUE Deficits / Details: No AROM noted this date, patient very lethargic.  Minor flexor tone noted with PROM to bicep. LUE Sensation: decreased light touch LUE Coordination: decreased fine motor, decreased gross motor  Lower Extremity Assessment: Defer to PT evaluation LLE Deficits / Details: pt appears to have a flicker of movement into knee extension, pt withdraws to pain. PT notes hip adductor tone    ADLs  Overall ADL's : Needs assistance/impaired Grooming: Wash/dry face, Maximal assistance, Standing Grooming Details (indicate cue type and reason): max A for L side support in sitting adn cues to sustain task. using wipe in R hand Lower Body Dressing: Maximal assistance, +2 for physical assistance, +2 for safety/equipment Lower Body Dressing Details (indicate cue type and reason): changes mesh underwear and pad Toilet Transfer: Maximal assistance, +2 for physical assistance, +2 for safety/equipment, BSC/3in1, Stand-pivot Toilet Transfer Details (indicate cue type and reason): stood from recliner and BSC placed behind Toileting- Clothing Manipulation and Hygiene: Maximal assistance, +2 for  physical assistance, +2 for safety/equipment, Sit to/from stand Toileting - Clothing Manipulation Details (indicate cue type and reason): +1 for standing, pt with BUE supported on sink, +1 for hygiene Functional mobility during ADLs: Maximal assistance, +2 for physical assistance, +2 for safety/equipment General ADL Comments: multiple stand pivots this session with max A +2, poor attention and requires constant re-direction. Joking with dughter throughout & internall distracted by head ache. L inattention    Mobility  Overal bed mobility: Needs Assistance Bed Mobility: Supine to Sit Rolling: Mod assist Sidelying to sit: Mod assist Supine to sit: HOB elevated, Mod assist Sit to supine: Total assist General bed mobility comments: pt laying diagonally in bed with legs out the side below bed rail. with assist of PT pt initiated pulling self up to EOB, PT in front to prevent sliding off bed    Transfers  Overall transfer level: Needs assistance Equipment used: 2 person hand held assist Transfers: Sit to/from Stand, Bed to chair/wheelchair/BSC Sit to Stand: +2 physical assistance, Mod assist Bed to/from chair/wheelchair/BSC transfer type::  Step pivot Stand pivot transfers: Mod assist, +2 physical assistance Step pivot transfers: +2 physical assistance, Mod assist General transfer comment: completed many sit to stand up to back of recliner. Tactile cues by PT at knee and posterior hip to engage muscles as pt with L sided neglect. pt with good power up, modA once instanding to prevent L knee buckling. by 4th stand pt able to stand and perform lateral weight shift L/R without L knee buckling, tactile cues at chest to stand up right. pt able to complete step to pvt to chair with max directional sequencing cues, no L knee buckling and min/modAx2    Ambulation / Gait / Stairs / Wheelchair Mobility  Ambulation/Gait General Gait Details: worked on marching in place while holding onto back of chair     Posture / Balance Dynamic Sitting Balance Sitting balance - Comments: pt initially maxA at EOB with strong L lateral lean, pt progressed to close min G and was able to maintain midline much better today with verbal cues Balance Overall balance assessment: Needs assistance Sitting-balance support: Feet supported Sitting balance-Leahy Scale: Poor Sitting balance - Comments: pt initially maxA at EOB with strong L lateral lean, pt progressed to close min G and was able to maintain midline much better today with verbal cues Postural control: Left lateral lean Standing balance support: Bilateral upper extremity supported Standing balance-Leahy Scale: Poor Standing balance comment: mod/maxA    Special needs/care consideration Decreased safety awareness   Previous Home Environment  Living Arrangements: Alone  Lives With: Alone Available Help at Discharge: Family, Available 24 hours/day (4 daughters and sister will rotate and provide 24/7 assist) Type of Home: House Home Layout: One level Home Access: Stairs to enter Entrance Stairs-Rails: None Entrance Stairs-Number of Steps: 3 Bathroom Shower/Tub: Optometrist: Yes How Accessible: Accessible via walker Home Care Services: No Additional Comments: pt is a limited historian due to lethargy and dysarthria  Discharge Living Setting Plans for Discharge Living Setting: Patient's home, Alone Type of Home at Discharge: House Discharge Home Layout: One level Discharge Home Access: Stairs to enter Entrance Stairs-Rails: None Entrance Stairs-Number of Steps: 3 Discharge Bathroom Shower/Tub: Tub/shower unit Discharge Bathroom Toilet: Standard Discharge Bathroom Accessibility: Yes How Accessible: Accessible via walker Does the patient have any problems obtaining your medications?: No  Social/Family/Support Systems Patient Roles: Parent (fulltime [employer] employee) Contact Information:  daughter, Edward Jolly Katha Cabal) Anticipated Caregiver: 4 daughters and sister to rotate to asisst Anticipated Caregiver's Contact Information: see contacts Ability/Limitations of Caregiver: daughters work but will make rotating schedule per [employer] Caregiver Availability: 24/7 Discharge Plan Discussed with Primary Caregiver: Yes Is Caregiver In Agreement with Plan?: Yes Does Caregiver/Family have Issues with Lodging/Transportation while Pt is in Rehab?: No  Goals Patient/Family Goal for Rehab: supervision to min assist with PT, OT and SLP Expected length of stay: ELOS 10 to 14 days Additional Information: decreased safety awareness and awareness of deficits Pt/Family Agrees to Admission and willing to participate: Yes Program Orientation Provided & Reviewed with Pt/Caregiver Including Roles  & Responsibilities: Yes  Decrease burden of Care through IP rehab admission: n/a  Possible need for SNF placement upon discharge: not anticipated  Patient Condition: I have reviewed medical records from [facility] , spoken with CM, and patient and daughter. I met with patient at the bedside for inpatient rehabilitation assessment.  Patient will benefit from ongoing PT, OT, and SLP, can actively participate in 3 hours of therapy a day 5 days  of the week, and can make measurable gains during the admission.  Patient will also benefit from the coordinated team approach during an Inpatient Acute Rehabilitation admission.  The patient will receive intensive therapy as well as Rehabilitation physician, nursing, social worker, and care management interventions.  Due to bladder management, bowel management, safety, skin/wound care, disease management, medication administration, pain management, and patient education the patient requires 24 hour a day rehabilitation nursing.  The patient is currently mod assist overall with max cues with mobility and basic ADLs.  Discharge setting and therapy post discharge at home with  home health is anticipated.  Patient has agreed to participate in the Acute Inpatient Rehabilitation Program and will admit today.  Preadmission Screen Completed By:  Cleatrice Burke, 09/19/2021 11:27 AM ______________________________________________________________________   Discussed status with Dr. Ranell Patrick  on 09/19/2021 at 1142 and received approval for admission today.  Admission Coordinator:  Cleatrice Burke, RN, time 5501 Date 09/19/2021   Assessment/Plan: Diagnosis: ICH Does the need for close, 24 hr/day Medical supervision in concert with the patient's rehab needs make it unreasonable for this patient to be served in a less intensive setting? Yes Co-Morbidities requiring supervision/potential complications: cerebral edema, overweight, acute metabolic encephalopathy, acute cerebral venous thrombosis, HTN Due to bladder management, bowel management, safety, skin/wound care, disease management, medication administration, pain management, and patient education, does the patient require 24 hr/day rehab nursing? Yes Does the patient require coordinated care of a physician, rehab nurse, PT, OT, and SLP to address physical and functional deficits in the context of the above medical diagnosis(es)? Yes Addressing deficits in the following areas: balance, endurance, locomotion, strength, transferring, bowel/bladder control, bathing, dressing, feeding, grooming, toileting, cognition, and psychosocial support Can the patient actively participate in an intensive therapy program of at least 3 hrs of therapy 5 days a week? Yes The potential for patient to make measurable gains while on inpatient rehab is fair Anticipated functional outcomes upon discharge from inpatient rehab: supervision PT, supervision OT, supervision SLP Estimated rehab length of stay to reach the above functional goals is: 12-16 days Anticipated discharge destination: Home 10. Overall Rehab/Functional Prognosis:  fair   MD Signature: Leeroy Cha, MD

## 2021-09-19 NOTE — Plan of Care (Signed)
Patient ID: Keyleigh Manninen, female   DOB: Apr 21, 1971, 51 y.o.   MRN: [id number] ? ? ? ?Problem: Education: ?Goal: Knowledge of General Education information will improve ?Description: Including pain rating scale, medication(s)/side effects and non-pharmacologic comfort measures ?Outcome: Adequate for Discharge ?  ?Problem: Health Behavior/Discharge Planning: ?Goal: Ability to manage health-related needs will improve ?Outcome: Adequate for Discharge ?  ?Problem: Clinical Measurements: ?Goal: Ability to maintain clinical measurements within normal limits will improve ?Outcome: Adequate for Discharge ?Goal: Will remain free from infection ?Outcome: Adequate for Discharge ?Goal: Diagnostic test results will improve ?Outcome: Adequate for Discharge ?Goal: Respiratory complications will improve ?Outcome: Adequate for Discharge ?Goal: Cardiovascular complication will be avoided ?Outcome: Adequate for Discharge ?  ?Problem: Activity: ?Goal: Risk for activity intolerance will decrease ?Outcome: Adequate for Discharge ?  ?Problem: Nutrition: ?Goal: Adequate nutrition will be maintained ?Outcome: Adequate for Discharge ?  ?Problem: Coping: ?Goal: Level of anxiety will decrease ?Outcome: Adequate for Discharge ?  ?Problem: Elimination: ?Goal: Will not experience complications related to bowel motility ?Outcome: Adequate for Discharge ?Goal: Will not experience complications related to urinary retention ?Outcome: Adequate for Discharge ?  ?Problem: Pain Managment: ?Goal: General experience of comfort will improve ?Outcome: Adequate for Discharge ?  ?Problem: Safety: ?Goal: Ability to remain free from injury will improve ?Outcome: Adequate for Discharge ?  ?Problem: Skin Integrity: ?Goal: Risk for impaired skin integrity will decrease ?Outcome: Adequate for Discharge ?  ?Problem: Education: ?Goal: Knowledge of disease or condition will improve ?Outcome: Adequate for Discharge ?Goal: Knowledge of secondary prevention will improve  (SELECT ALL) ?Outcome: Adequate for Discharge ?Goal: Knowledge of patient specific risk factors will improve (INDIVIDUALIZE FOR PATIENT) ?Outcome: Adequate for Discharge ?Goal: Individualized Educational Video(s) ?Outcome: Adequate for Discharge ?  ?Problem: Coping: ?Goal: Will verbalize positive feelings about self ?Outcome: Adequate for Discharge ?  ?Problem: Health Behavior/Discharge Planning: ?Goal: Ability to manage health-related needs will improve ?Outcome: Adequate for Discharge ?  ?Problem: Self-Care: ?Goal: Ability to participate in self-care as condition permits will improve ?Outcome: Adequate for Discharge ?Goal: Verbalization of feelings and concerns over difficulty with self-care will improve ?Outcome: Adequate for Discharge ?Goal: Ability to communicate needs accurately will improve ?Outcome: Adequate for Discharge ?  ?Problem: Nutrition: ?Goal: Risk of aspiration will decrease ?Outcome: Adequate for Discharge ?Goal: Dietary intake will improve ?Outcome: Adequate for Discharge ?  ?Problem: Ischemic Stroke/TIA Tissue Perfusion: ?Goal: Complications of ischemic stroke/TIA will be minimized ?Outcome: Adequate for Discharge ?  ?Problem: Spontaneous Subarachnoid Hemorrhage Tissue Perfusion: ?Goal: Complications of Spontaneous Subarachnoid Hemorrhage will be minimized ?Outcome: Adequate for Discharge ?  ? ?Haydee Salter, RN ? ?

## 2021-09-19 NOTE — Progress Notes (Signed)
Inpatient Rehabilitation Admissions Coordinator  ? ?I have insurance approval and CIR bed available to admit her today. I met with patient at bedside with SLP and discussed admit. She is in agreement. I contacted her daughter , Verl Bangs, by phone and reviewed cost of care. She is also in agreement to Cir admit today. I have alerted acute team and TOC . I will make the arrangements to admit today. ? ?Danne Baxter, RN, MSN ?Rehab Admissions Coordinator ?[phone] ?09/19/2021 11:18 AM ? ?

## 2021-09-19 NOTE — Progress Notes (Signed)
Inpatient Rehabilitation Admission Medication Review by a Pharmacist ? ?A complete drug regimen review was completed for this patient to identify any potential clinically significant medication issues. ? ?High Risk Drug Classes Is patient taking? Indication by Medication  ?Antipsychotic Yes Compazine- N/V  ?Anticoagulant Yes Apixaban- CVA infarct  ?Antibiotic No   ?Opioid Yes Tramadol- acute pain  ?Antiplatelet No   ?Hypoglycemics/insulin No   ?Vasoactive Medication Yes Lopressor- hypertension, rate control  ?Chemotherapy No   ?Other Yes Megace- uterine fibroids ?Lipitor- HDL ?Keppra- seizure prophylaxis ?Paxil- MDD/anxiety ?Trazodone- sleep  ? ? ? ?Type of Medication Issue Identified Description of Issue Recommendation(s)  ?Drug Interaction(s) (clinically significant) ?    ?Duplicate Therapy ?    ?Allergy ?    ?No Medication Administration End Date ?    ?Incorrect Dose ?    ?Additional Drug Therapy Needed ?    ?Significant med changes from prior encounter (inform family/care partners about these prior to discharge).    ?Other ?    ? ? ?Clinically significant medication issues were identified that warrant physician communication and completion of prescribed/recommended actions by midnight of the next day:  No ? ? ?Time spent performing this drug regimen review (minutes):  30 ? ? ?Jaynee Winters BS, PharmD, BCPS ?Clinical Pharmacist ?09/20/2021 9:02 AM ? ? ? ? ?

## 2021-09-19 NOTE — Discharge Summary (Signed)
Physician Discharge Summary  April Reilly WNI:[id number] DOB: 1970/12/25 DOA: 09/10/2021  PCP: Pcp, No  Admit date: 09/10/2021 Discharge date: 09/19/2021  Admitted From: Home Disposition: CIR  Recommendations for Outpatient Follow-up:  Follow up with PCP in 1-2 weeks Follow-up with GYN as scheduled Follow-up with neurology in 3 months  Discharge Condition: Stable CODE STATUS: Full code Diet recommendation: Heart healthy diet  History of present illness:  [name] is a 51 y.o. female with history of sickle cell,  presenting with altered mental status, decreased LOC, flu like symptoms and possible unwitnessed fall out of bed. CT shows  large region of cortical subcortical edema within the right frontal parietal occipital and temporal lobes as well as right insula and subinsular region with patchy foci of acute parenchymal hemorrhage in the same areas. Concern for an acute, early subacute ischemia versus thrombosis of the venous system. 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 Heparin stroke protocol initiated. Hypertonic saline for cerebral edema. CT scan 2/21 showed progressive increase in midline shift from 46mm to 50mm, unchanged hemorrhage in right cerebral hemisphere. Neurosurgery recommends medical management with hypertonic saline as opposed to crani due to heparin infusion.  Hospital course:  Assessment and Plan: Acute metabolic encephalopathy Patient presenting to ED with confusion and was found to have cerebral venous sinus thrombosis complicated by intracranial hemorrhage.  Continue treatment as below.  Mental status now back to normal baseline.  Acute cerebral venous sinus thrombosis Neurology neurosurgery was consulted and followed during hospital course.  CTA head and neck shows multi focal narrowing in the right > left A1 , right M1 and M2 segments reflecting vasospasm. Hyperdensity and IPH in the right  cerebral hemisphere, concerning for infarct with hemorrhagic conversion, with 11 mm of right-to-left midline shift, and effacement of the basal cisterns. CT venogram shows venous sinus thrombosis in the distal right transverse sinus and right sigmoid sinus.  She was started on IV heparin and transitioned to Eliquis. MRI shows large area of cortical infarction in the right temporal , occipital parietal and posterior frontal lobes with sig associated edema and remonstrated intra parenchymal hemorrhage. CT venogram showed Evolving hemorrhagic infarction primarily involving temporal and occipital lobes with extension into the parietal lobe. No new hemorrhage. Mass effect is stable to mildly improved.  Venous duplex ultrasound lower extremities negative for DVT.  LDL 84, hemoglobin A1c 5.3.  TTE with no significant findings.  Continue Eliquis.  Outpatient follow-up with neurology in 3 months.   ICH (intracerebral hemorrhage) ([place])- (present on admission) Repeat CT venogram and CT angio of the head with and without contrast showed Evolving hemorrhagic infarction primarily involving temporal andoccipital lobes with extension into the parietal lobe. No new hemorrhage. Mass effect is stable to mildly improved. Persistent but improved nonocclusive thrombus within the right distal transverse and sigmoid sinuses.  Outpatient follow-up with neurology 3 months.    Cerebral edema ([employer])- (present on admission) Concern for uncal herniation. Repeat CT stable on 2/24 stable with 9 mm shift.  Patient did receive hypertonic saline per neurosurgery recommendations during hospital course. Mass effect on the repeat CT angio of the head and CT venogram is stable to mildly improved.   Seizure (HCC) Continue Keppra 500 mg p.o. twice daily.  Essential hypertension- (present on admission) Metoprolol tartrate 25 mg p.o. twice daily.  Hyperlipidemia- (present on admission) Atorvastatin 40 mg p.o. daily  Aspiration pneumonia  ([place])- (present on admission) Completed course of antibiotics  while inpatient.  Oxygen well on room air.  Iron deficiency anemia Stable, hemoglobin 7.7 on discharge.  Sickle cell disease ([place])- (present on admission) No crisis.   Uterine fibroid- (present on admission) Patient has establish care in the area with GYN.  Continue Megace 40 mg p.o. twice daily.       Discharge Diagnoses:  Active Problems:   Acute metabolic encephalopathy   Acute cerebral venous sinus thrombosis   ICH (intracerebral hemorrhage) (HCC)   Cerebral edema (HCC)   Seizure (HCC)   Essential hypertension   Hyperlipidemia   Aspiration pneumonia (HCC)   Iron deficiency anemia   Sickle cell disease ([place])   Uterine fibroid    Discharge Instructions  Discharge Instructions     Call MD for:  difficulty breathing, headache or visual disturbances   Complete by: As directed    Call MD for:  extreme fatigue   Complete by: As directed    Call MD for:  persistant dizziness or light-headedness   Complete by: As directed    Call MD for:  persistant nausea and vomiting   Complete by: As directed    Call MD for:  severe uncontrolled pain   Complete by: As directed    Call MD for:  temperature >100.4   Complete by: As directed    Diet - low sodium heart healthy   Complete by: As directed    Increase activity slowly   Complete by: As directed       Allergies as of 09/19/2021   No Known Allergies      Medication List     TAKE these medications    apixaban 5 MG Tabs tablet Commonly known as: ELIQUIS Take 1 tablet (5 mg total) by mouth 2 (two) times daily.   atorvastatin 40 MG tablet Commonly known as: LIPITOR Take 1 tablet (40 mg total) by mouth daily. Start taking on: September 20, 2021   ibuprofen 200 MG tablet Commonly known as: ADVIL Take 200 mg by mouth every 6 (six) hours as needed for mild pain.   levETIRAcetam 500 MG tablet Commonly known as: KEPPRA Take 1 tablet (500 mg total) by  mouth 2 (two) times daily.   megestrol 40 MG tablet Commonly known as: MEGACE Take 1 tablet (40 mg total) by mouth 2 (two) times daily.   metoprolol tartrate 25 MG tablet Commonly known as: LOPRESSOR Take 1 tablet (25 mg total) by mouth 2 (two) times daily.   PARoxetine 10 MG tablet Commonly known as: PAXIL Take 1 tablet (10 mg total) by mouth daily. Start taking on: September 20, 2021   senna-docusate 8.6-50 MG tablet Commonly known as: Senokot-S Take 2 tablets by mouth at bedtime.   traMADol 50 MG tablet Commonly known as: ULTRAM Take 0.5 tablets (25 mg total) by mouth every 6 (six) hours as needed for moderate pain.        Follow-up Information     [facility]. Schedule an appointment as soon as possible for a visit in 3 month(s).   Specialty: Neurology Contact information: [address] [place] [phone]               No Known Allergies  Consultations: Neurology Neurosurgery PCCM   Procedures/Studies: CT ANGIO HEAD W OR WO CONTRAST  Result Date: 09/17/2021 CLINICAL DATA:  Stroke, follow-up, intracranial hemorrhage and dural sinus thrombosis EXAM: CT ANGIOGRAPHY HEAD CT VENTOGRAM HEAD TECHNIQUE: Multidetector CT imaging of the head was performed using the  standard protocol before and during bolus administration of intravenous contrast. Imaging was repeated in the delayed venous phase. Multiplanar CT image reconstructions and MIPs were obtained to evaluate the vascular anatomy. RADIATION DOSE REDUCTION: This exam was performed according to the departmental dose-optimization program which includes automated exposure control, adjustment of the mA and/or kV according to patient size and/or use of iterative reconstruction technique. CONTRAST:  61mL OMNIPAQUE IOHEXOL 350 MG/ML SOLN COMPARISON:  Recent CT and MR imaging FINDINGS: CT HEAD Brain: Evolving hemorrhagic infarction is again identified with edema and  patchy hemorrhage again noted in the right parietal, temporal, and occipital lobes extending to involve deep white matter tracts. Mass effect is stable to mildly improved with persistent subcentimeter leftward midline shift. No hydrocephalus. No new loss of gray-white differentiation. Vascular: Better evaluated on CTA and CTV. Skull: Calvarium is unremarkable. Sinuses/Orbits: No acute finding. Other: None. CTA HEAD Anterior circulation: Intracranial internal carotid arteries are patent. Anterior and middle cerebral arteries are patent. Posterior circulation: Intracranial vertebral arteries, basilar artery, and posterior cerebral arteries are patent. Review of the MIP images confirms the above findings. CTV HEAD Superior sagittal sinus is patent. Straight sinus, vein of [place], and internal cerebral veins are patent. Left transverse and sigmoid sinuses are patent. There are persistent filling defects within the right transverse and sigmoid sinuses but opacification is improved compared to the prior study. IMPRESSION: Evolving hemorrhagic infarction primarily involving temporal and occipital lobes with extension into the parietal lobe. No new hemorrhage. Mass effect is stable to mildly improved. Persistent but improved nonocclusive thrombus within the right distal transverse and sigmoid sinuses. Electronically Signed   By: Macy Mis M.D.   On: 09/17/2021 13:14   CT ANGIO HEAD NECK W WO CM  Result Date: 09/10/2021 CLINICAL DATA:  Infarct with hemorrhagic conversion versus hemorrhagic infarct EXAM: CT ANGIOGRAPHY HEAD AND NECK TECHNIQUE: Multidetector CT imaging of the head and neck was performed using the standard protocol during bolus administration of intravenous contrast. Multiplanar CT image reconstructions and MIPs were obtained to evaluate the vascular anatomy. Carotid stenosis measurements (when applicable) are obtained utilizing NASCET criteria, using the distal internal carotid diameter as the  denominator. RADIATION DOSE REDUCTION: This exam was performed according to the departmental dose-optimization program which includes automated exposure control, adjustment of the mA and/or kV according to patient size and/or use of iterative reconstruction technique. CONTRAST:  6mL OMNIPAQUE IOHEXOL 350 MG/ML SOLN COMPARISON:  09/10/2021 FINDINGS: CT HEAD FINDINGS Brain: Redemonstrated area of hypodensity in the right frontal, parietal, occipital, and temporal lobes, including involvement of the right insula and right medial temporal lobe, with superimposed parenchymal hemorrhage in the right parietal, occipital, and temporal lobes. Hemorrhage is again noted in the occipital horn of the right lateral ventricle. Redemonstrated mass effect with approximately 11 mm of right-to-left midline shift, unchanged when measured similarly. Redemonstrated effacement of the basilar cisterns. Vascular: No hyperdense vessel. Skull: No acute fracture or focal lesion. Well corticated focal defects in the left greater than right parietal calvarium (series 10, image 40), of indeterminate etiology, which were present on the prior exam. Sinuses: Minimal fluid in the left ethmoid air cells. Imaged portions are otherwise clear. Orbits: No acute finding. Review of the MIP images confirms the above findings CTA NECK FINDINGS Aortic arch: Standard branching. Imaged portion shows no evidence of aneurysm or dissection. No significant stenosis of the major arch vessel origins. Right carotid system: No evidence of dissection, stenosis (50% or greater) or occlusion. Left carotid system: No evidence of  dissection, stenosis (50% or greater) or occlusion. Vertebral arteries: No evidence of dissection, stenosis (50% or greater) or occlusion. Skeleton: Straightening of the normal cervical lordosis. No acute osseous abnormality. Other neck: Nasogastric tube.  Otherwise negative. Upper chest: Ground-glass opacities in the dependent right lung (series  10, image 330), and to a much lesser extent the dependent left lung, which may be infectious or inflammatory. No pleural effusion. Review of the MIP images confirms the above findings CTA HEAD FINDINGS Anterior circulation: Both internal carotid arteries are patent to the termini, with mild narrowing in the right petrous and cavernous segments, compared to the left, without focal calcification or stenosis. A1 segments patent, although there is multifocal irregularity of the right-greater-than-left A1, possibly representing vasospasm. Normal anterior communicating artery. Anterior cerebral arteries are patent to their distal aspects. No M1 stenosis or occlusion, although there is multifocal irregularity in the right M1 segment, likely representing vasospasm. This irregularity extends into the proximal right M2 segments, with less irregularity in the distal right MCA branches. Left M1 and MCA branches are perfused without focal stenosis. Posterior circulation: Vertebral arteries patent to the vertebrobasilar junction without stenosis. Posterior inferior cerebral arteries patent bilaterally. Basilar patent to its distal aspect. Superior cerebellar arteries patent bilaterally. Patent P1 segments. PCAs perfused to their distal aspects without stenosis. The left posterior communicating artery is somewhat irregular but patent. The left posterior communicating artery is not definitively visualized. Venous sinuses: Please see same-day CT venogram. Anatomic variants: None significant Review of the MIP images confirms the above findings IMPRESSION: 1. Multifocal narrowing in the right-greater-than-left A1, right M1, and proximal right M2 segments, likely reflecting vasospasm. Narrowing in the right petrous and cavernous ICA may also be related to known intracranial hemorrhage. 2. No intracranial large vessel occlusion or significant stenosis. 3.  No hemodynamically significant stenosis in the neck. 4. Redemonstrated  hyperdensity and intraparenchymal hemorrhage in the right cerebral hemisphere, concerning for infarct with hemorrhagic conversion, with 11 mm of right-to-left midline shift, unchanged when remeasured similarly, and effacement of the basal cisterns. 5. Redemonstrated hemorrhage within the right lateral ventricle, without evidence of hydrocephalus. 6. Please see same day CT venogram for findings involving the venous sinuses. Electronically Signed   By: Merilyn Baba M.D.   On: 09/10/2021 22:20   CT HEAD WO CONTRAST (5MM)  Result Date: 09/14/2021 CLINICAL DATA:  Stroke, follow up EXAM: CT HEAD WITHOUT CONTRAST TECHNIQUE: Contiguous axial images were obtained from the base of the skull through the vertex without intravenous contrast. RADIATION DOSE REDUCTION: This exam was performed according to the departmental dose-optimization program which includes automated exposure control, adjustment of the mA and/or kV according to patient size and/or use of iterative reconstruction technique. COMPARISON:  09/12/2021 FINDINGS: Brain: Edema and patchy hemorrhage are again identified in the right parietal, temporal, and occipital lobes extending to involve deep white matter. Mass effect is similar including leftward midline shift (9 mm), diffuse sulcal effacement, and basal cistern effacement. Hemorrhage is again noted within the right occipital horn. Ventricle size is similar with possible minimal increase in size of the left lateral ventricle. No new loss of gray-white differentiation. Vascular: No new findings. Skull: Calvarium is unremarkable. Sinuses/Orbits: No acute finding. Other: None. IMPRESSION: Stable extent of infarction and hemorrhage. Associated edema, mass effect, and ventricle caliber similar. Electronically Signed   By: Macy Mis M.D.   On: 09/14/2021 16:33   CT HEAD WO CONTRAST (5MM)  Result Date: 09/12/2021 CLINICAL DATA:  Stroke follow-up EXAM: CT HEAD WITHOUT  CONTRAST TECHNIQUE: Contiguous axial  images were obtained from the base of the skull through the vertex without intravenous contrast. RADIATION DOSE REDUCTION: This exam was performed according to the departmental dose-optimization program which includes automated exposure control, adjustment of the mA and/or kV according to patient size and/or use of iterative reconstruction technique. COMPARISON:  Head CT from yesterday FINDINGS: Brain: Cytotoxic pattern edema in the right temporal, occipital, and lower frontal parietal regions with patchy internal parenchymal hemorrhage also affecting the collapsed right occipital horn. Diffuse effacement of sulci. Midline shift measures 9 mm. No ventriculomegaly or change in ventricular volume. No compressive arterial infarct. Vascular: See prior vessel imaging. Skull: Normal. Negative for fracture or focal lesion. Sinuses/Orbits: No acute finding. IMPRESSION: Unchanged cytotoxic edema with hemorrhage in the right cerebral hemisphere. Midline shift measures 9 mm. Stable ventricles. Electronically Signed   By: Jorje Guild M.D.   On: 09/12/2021 05:01   CT HEAD WO CONTRAST (5MM)  Result Date: 09/11/2021 CLINICAL DATA:  Neuro deficit with acute stroke suspected EXAM: CT HEAD WITHOUT CONTRAST TECHNIQUE: Contiguous axial images were obtained from the base of the skull through the vertex without intravenous contrast. RADIATION DOSE REDUCTION: This exam was performed according to the departmental dose-optimization program which includes automated exposure control, adjustment of the mA and/or kV according to patient size and/or use of iterative reconstruction technique. COMPARISON:  Head CT from yesterday FINDINGS: Brain: Cytotoxic edema and patchy parenchymal hemorrhage in the posterior right cerebrum affecting the temporal, occipital, and parietal lobes. Diffuse effacement of sulci and leftward midline shift measuring 11 mm. Hemorrhage within the right posterior occipital horn of the lateral ventricle, which is  otherwise compressed. Vascular: See prior CTA and CT.  No interval finding. Skull: Normal. Negative for fracture or focal lesion. Sinuses/Orbits: No acute finding. IMPRESSION: Unchanged extent of venous infarction with hemorrhage in the right cerebral hemisphere. Mildly progressive swelling causes further midline shift which is increased from 9 to 11 mm. Electronically Signed   By: Jorje Guild M.D.   On: 09/11/2021 07:09   CT Head Wo Contrast  Addendum Date: 09/10/2021   ADDENDUM REPORT: 09/10/2021 09:58 ADDENDUM: These results were called by telephone at the time of interpretation on 09/10/2021 at 9:54 am to provider [facility] , who verbally acknowledged these results. Electronically Signed   By: Kellie Simmering D.O.   On: 09/10/2021 09:58   Result Date: 09/10/2021 CLINICAL DATA:  Provided history: Mental status change, unknown cause. EXAM: CT HEAD WITHOUT CONTRAST TECHNIQUE: Contiguous axial images were obtained from the base of the skull through the vertex without intravenous contrast. RADIATION DOSE REDUCTION: This exam was performed according to the departmental dose-optimization program which includes automated exposure control, adjustment of the mA and/or kV according to patient size and/or use of iterative reconstruction technique. COMPARISON:  None. FINDINGS: Brain: Large region of cortical/subcortical edema within the right frontal, parietal, occipital and temporal lobes, as well as right insula and subinsular region. There are superimposed patchy foci of acute parenchymal hemorrhage within the right temporal, occipital and parietal lobes. Moderate-volume extension of hemorrhage into the right lateral ventricle is questioned (see annotations on series 3, image 11). Associated mass effect with significant partial effacement of the right lateral ventricle. 9 mm leftward midline shift measured at the level of the septum pellucidum. The basal cisterns are effaced. No definite evidence of  ventricular entrapment at this time. No extra-axial fluid collection. Vascular: No hyperdense vessel. Skull: Normal. Negative for fracture or focal lesion. Sinuses/Orbits: Visualized orbits  show no acute finding. Small volume fluid layering within a posterior left ethmoid air cell. Attempts are being made to reach the ordering provider at this time. IMPRESSION: Large region of cortical/subcortical edema within the right frontal, parietal, occipital and temporal lobes, as well as right insula and subinsular region. Superimposed patchy foci of acute parenchymal hemorrhage within the right parietal, occipital and temporal lobes. These findings are favored to reflect an acute/early subacute infarct from arterial ischemia (with hemorrhagic conversion) or a hemorrhagic infarct due to intracranial venous thrombosis. An MRI of the brain with contrast (including thin-slice [id number] post-contrast imaging for assessment of the intracranial venous structures) is recommended for further evaluation. A CTA of the head/neck should also be considered. Associated mass effect with significant partial effacement of the right lateral ventricle and 9 mm leftward midline shift. The basal cisterns are effaced. Neurosurgical consultation is advised. Moderate-volume extension of hemorrhage into the right lateral ventricle is questioned. No definite evidence of hydrocephalus or ventricular entrapment at this time. Electronically Signed: By: Kellie Simmering D.O. On: 09/10/2021 09:48   MR BRAIN W WO CONTRAST  Result Date: 09/10/2021 CLINICAL DATA:  Stroke EXAM: MRI HEAD WITHOUT AND WITH CONTRAST TECHNIQUE: Multiplanar, multiecho pulse sequences of the brain and surrounding structures were obtained without and with intravenous contrast. CONTRAST:  1mL GADAVIST GADOBUTROL 1 MMOL/ML IV SOLN COMPARISON:  No prior MRI, correlation is made with CT head and CTA head and CT venogram 09/10/2021 FINDINGS: Brain: Restricted diffusion in the right  temporal lobe, occipital lobe, parietal lobe, and posterior frontal lobe, predominantly in the cortex (series 5, images 66-85). This area also demonstrates a large amount of increased T2 signal, consistent with edema, as well as T1 isointense and T2 hypointense signal consistent with acute hemorrhage in the right temporal and occipital lobes (series 11, image 11). This causes significant mass effect and 11 mm of right-to-left midline shift, with medial displacement of the right uncus and temporal horn of the lateral ventricle. Additional area of restricted diffusion in the medial right aspect of the basal ganglia (series 5, image 75 and series 7, image 56), possibly in the preoptic area of the hypothalamus, likely reflecting sequela of the aforementioned mass effect. Effacement of the basal cisterns. Hemorrhage is noted layering in the occipital horns of the lateral ventricles. Effacement of the right lateral ventricle and third ventricle. No evidence of hydrocephalus. Small amount of subdural hemorrhage along the right frontal lobe (series 10, image 17), measuring up to 4 mm. Leptomeningeal enhancement in the previously noted affected areas of the right cerebral hemisphere. Vascular: Filling defect in the right distal transverse and proximal and mid sigmoid sinus, consistent with thrombus, as seen on the CTV. Arterial flow voids appear normal, although there is leftward displacement of the anterior cerebral arteries and right PCA. Skull and upper cervical spine: Normal marrow signal. Sinuses/Orbits: Negative. Other: None. IMPRESSION: 1. Large area of cortical infarction in the right temporal, occipital, parietal, and posterior frontal lobes, with significant associated edema and redemonstrated intraparenchymal hemorrhage. The mass effect causes 11 mm of right-to-left midline shift, medial displacement of the right uncus and right temporal horn and effacement of the basal cisterns, unchanged from the prior CT and  raising concern for right uncal herniation. 2. Additional focal area of restricted diffusion in the medial aspect of the right basal ganglia, suspected to be in the preoptic area of the hypothalamus, likely sequela of aforementioned mass effect. 3. Hemorrhage layering in the occipital horns of the lateral ventricles, with  redemonstrated effacement of the right lateral and third ventricle. No evidence of hydrocephalus. 4. Small amount of subdural hemorrhage along the right frontal lobe, measuring up to 4 mm. 5. Filling defect in the distal right transverse sinus and proximal and mid right sigmoid sinus, consistent with thrombus, as seen on the same-day CTV. These results were called by telephone at the time of interpretation on 09/10/2021 at 11:58 pm to provider DR. ARORA, who verbally acknowledged these results. Electronically Signed   By: Merilyn Baba M.D.   On: 09/10/2021 23:59   DG Chest Port 1 View  Result Date: 09/11/2021 CLINICAL DATA:  Pneumonia EXAM: PORTABLE CHEST 1 VIEW COMPARISON:  09/10/2021 FINDINGS: Enteric tube courses below the diaphragm with distal tip beyond the inferior margin of the film. Interval placement of right-sided PICC line with distal tip terminating at the level of the superior cavoatrial junction. Normal heart size. Interval development of hazy right lower lobe airspace opacity. Suspected small right pleural effusion. Left lung is clear. No pneumothorax. Dextroscoliotic thoracic curvature. IMPRESSION: Interval development of hazy right lower lobe airspace opacity with probable small right pleural effusion. Findings concerning for pneumonia. Electronically Signed   By: Davina Poke D.O.   On: 09/11/2021 08:12   DG Chest Port 1 View  Result Date: 09/10/2021 CLINICAL DATA:  Questionable sepsis - evaluate for abnormality EXAM: PORTABLE CHEST 1 VIEW COMPARISON:  None. FINDINGS: Rotated. No consolidation or edema. No pleural effusion. Cardiomediastinal contours are likely within  normal limits for technique. Thoracic spine dextroscoliosis. IMPRESSION: No acute process in the chest. Electronically Signed   By: Macy Mis M.D.   On: 09/10/2021 08:55   DG Abd Portable 1V  Result Date: 09/10/2021 CLINICAL DATA:  Feeding tube inserted.  Check placement. EXAM: PORTABLE ABDOMEN - 1 VIEW COMPARISON:  None. FINDINGS: Weighted tip enteric tube descends below the diaphragm and curls along the gastric fundus and greater curvature of the stomach with the tip overlying the distal aspect of the stomach. Moderate dextrocurvature of the mid to lower thoracic spine Nonobstructed bowel-gas pattern. Scattered subsegmental atelectasis within the visualized lungs. IMPRESSION: Enteric tube tip overlies the distal stomach. Electronically Signed   By: Yvonne Kendall M.D.   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 EEG to evaluate for seizure Level of alertness:  lethargic AEDs during EEG study: None Technical aspects: This EEG study was done with scalp electrodes positioned according to the 10-20 International system of electrode placement. Electrical activity was acquired at a sampling rate of 500Hz  and reviewed with a high frequency filter of 70Hz  and a low frequency filter of 1Hz . EEG data were recorded continuously and digitally stored. Description: No clear posterior dominant rhythm was seen. EEG showed continuous generalized and lateralized right hemisphere polymorphic disorganized 6-9hz  theta-alpha activity. Lateralized periodic discharges were noted in right  hemisphere, maximal right parieto-occipital region with fluctuating frequency of 0.5 to 1.5hz  at times with overriding rhythmicity ( LPD+R) Hyperventilation and photic stimulation were not performed.   ABNORMALITY - Lateralized periodic discharges with overriding rhythmicity ( LPD +R),  right hemisphere, maximal right parieto-occipital region - Continuous slow, generalized and  lateralized right hemisphere IMPRESSION: This study showed evidence of epileptogenicity and cortical dysfunction arising from right hemisphere, maximal right parieto-occipital region. Likely due to underlying hemorrhage. This EEG pattern is on the ictal-interictal continuum with higher potential for seizure recurrence. Additionally there is moderate diffuse encephalopathy, nonspecific etiology.  No definite seizures were seen throughout the recording. 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 EEG to evaluate for seizure  Level of alertness:  lethargic  AEDs during EEG study: Ativan, PHT  Technical aspects: This EEG study was done with scalp electrodes positioned according to the 10-20 International system of electrode placement. Electrical activity was acquired at a sampling rate of 500Hz  and reviewed with a high frequency filter of 70Hz  and a low frequency filter of 1Hz . EEG data were recorded continuously and digitally stored.  Description: No clear posterior dominant rhythm was seen. EEG showed continuous generalized and lateralized right hemisphere polymorphic disorganized mixed  frequencies with predominantly 6-9hz  theta-alpha activity admixed with 13 to 15 Hz beta activity.  Abundant sharp waves were noted in right hemisphere, maximal right parieto-occipital region which appeared quasiperiodic at 0.5 to 2 hz. Hyperventilation and photic stimulation were not performed.    ABNORMALITY - Sharp waves,  right hemisphere, maximal right parieto-occipital region - Continuous slow, generalized and lateralized right hemisphere  IMPRESSION: This study showed evidence of epileptogenicity and cortical dysfunction arising from right hemisphere, maximal right parieto-occipital region likely due to underlying hemorrhage. Additionally there is moderate diffuse encephalopathy, nonspecific etiology.  No definite seizures were seen throughout the recording.  Lora Havens    ECHOCARDIOGRAM COMPLETE  Result Date: 09/10/2021    ECHOCARDIOGRAM REPORT   Patient Name:   MERISSA RENWICK Date of Exam: 09/10/2021 Medical Rec #:  [id number]     Height:       59.0 in Accession #:    [id number]    Weight:       136.7 lb Date of Birth:  1971-01-20     BSA:          1.569 m Patient Age:    4 years      BP:           137/53 mmHg Patient Gender: F             HR:           97 bpm. Exam Location:  Inpatient Procedure: 2D Echo, Color Doppler and Cardiac Doppler Indications:    Stroke i63.9  History:        Patient has no prior history of Echocardiogram examinations.  Sonographer:    Raquel Sarna Senior RDCS Referring Phys: [phone] Rocko Fesperman LINDZEN  Sonographer Comments: Windows limited by implants IMPRESSIONS  1. Left ventricular ejection fraction, by estimation, is 60 to 65%. The left ventricle has normal function. The left ventricle has no regional wall motion abnormalities. Left ventricular diastolic parameters were normal.  2. Right ventricular systolic function is normal. The right ventricular size is normal.  3. The mitral valve is normal in structure. Trivial mitral valve regurgitation.  4. The aortic valve is normal in  structure. Aortic valve regurgitation is not visualized. No aortic stenosis is present. FINDINGS  Left Ventricle: Left ventricular ejection fraction, by estimation, is 60 to 65%. The left ventricle has normal function. The left ventricle has no regional wall motion abnormalities. The left ventricular internal cavity size was normal in size. There is  no left ventricular hypertrophy. Left ventricular diastolic parameters were normal. Right Ventricle: The right ventricular size is normal. Right vetricular wall thickness was not well visualized. Right ventricular systolic function is normal. Left Atrium: Left atrial size was normal in size. Right Atrium: Right atrial size was normal in size. Pericardium: There is no evidence of pericardial effusion. Mitral Valve: The mitral valve is normal in structure. Trivial mitral valve regurgitation. Tricuspid Valve: The tricuspid valve is normal in structure. Tricuspid valve regurgitation is trivial. Aortic Valve: The aortic valve is normal in structure. Aortic valve regurgitation is not visualized. No aortic stenosis is present. Pulmonic Valve: The pulmonic valve was normal in structure. Pulmonic valve regurgitation is not visualized. Aorta: The aortic root and ascending aorta are structurally normal, with no evidence of dilitation. IAS/Shunts: The atrial septum is grossly normal.  LEFT VENTRICLE PLAX 2D LVIDd:         3.90 cm LVIDs:         2.30 cm LV PW:         0.70 cm LV IVS:        0.50 cm LVOT diam:     1.70 cm LV SV:         66 LV SV Index:   42 LVOT Area:     2.27 cm  RIGHT VENTRICLE RV S prime:     14.00 cm/s TAPSE (M-mode): 2.9 cm LEFT ATRIUM             Index        RIGHT ATRIUM           Index LA diam:        3.10 cm 1.98 cm/m   RA Area:     11.40 cm LA Vol (A2C):   32.8 ml 20.91 ml/m  RA Volume:   24.10 ml  15.36 ml/m LA Vol (A4C):   38.6 ml 24.60 ml/m LA Biplane Vol: 38.7 ml 24.67 ml/m  AORTIC VALVE LVOT Vmax:   161.00 cm/s LVOT Vmean:  91.600 cm/s LVOT VTI:     0.289 m  AORTA Ao Root diam: 2.60 cm MITRAL VALVE MV Area (PHT): 2.80 cm    SHUNTS MV Decel Time: 271 msec    Systemic VTI:  0.29 m MV E velocity: 90.00 cm/s  Systemic Diam: 1.70 cm MV A velocity: 38.10 cm/s MV E/A ratio:  2.36 Mertie Moores MD Electronically signed by Mertie Moores MD Signature Date/Time: 09/10/2021/4:02:58 PM    Final    CT VENOGRAM HEAD  Result Date: 09/17/2021 CLINICAL DATA:  Stroke, follow-up, intracranial hemorrhage and dural sinus thrombosis EXAM: CT ANGIOGRAPHY HEAD CT VENTOGRAM HEAD TECHNIQUE: Multidetector CT imaging of the head was performed using the standard protocol before and during bolus administration of intravenous contrast. Imaging was repeated in the delayed venous phase. Multiplanar CT image reconstructions and MIPs were obtained to evaluate the vascular anatomy. RADIATION DOSE REDUCTION: This exam was performed according to the departmental dose-optimization program which includes automated exposure control, adjustment of the mA and/or kV according to patient size and/or use of iterative reconstruction technique. CONTRAST:  38mL OMNIPAQUE IOHEXOL 350 MG/ML SOLN COMPARISON:  Recent CT and MR imaging FINDINGS: CT HEAD  Brain: Evolving hemorrhagic infarction is again identified with edema and patchy hemorrhage again noted in the right parietal, temporal, and occipital lobes extending to involve deep white matter tracts. Mass effect is stable to mildly improved with persistent subcentimeter leftward midline shift. No hydrocephalus. No new loss of gray-white differentiation. Vascular: Better evaluated on CTA and CTV. Skull: Calvarium is unremarkable. Sinuses/Orbits: No acute finding. Other: None. CTA HEAD Anterior circulation: Intracranial internal carotid arteries are patent. Anterior and middle cerebral arteries are patent. Posterior circulation: Intracranial vertebral arteries, basilar artery, and posterior cerebral arteries are patent. Review of the MIP images confirms the  above findings. CTV HEAD Superior sagittal sinus is patent. Straight sinus, vein of Galen, and internal cerebral veins are patent. Left transverse and sigmoid sinuses are patent. There are persistent filling defects within the right transverse and sigmoid sinuses but opacification is improved compared to the prior study. IMPRESSION: Evolving hemorrhagic infarction primarily involving temporal and occipital lobes with extension into the parietal lobe. No new hemorrhage. Mass effect is stable to mildly improved. Persistent but improved nonocclusive thrombus within the right distal transverse and sigmoid sinuses. Electronically Signed   By: Macy Mis M.D.   On: 09/17/2021 13:14   CT VENOGRAM HEAD  Result Date: 09/10/2021 CLINICAL DATA:  Stroke, hemorrhagic EXAM: CT VENOGRAM HEAD TECHNIQUE: Venographic phase images of the brain were obtained following the administration of intravenous contrast. Multiplanar reformats and maximum intensity projections were generated. RADIATION DOSE REDUCTION: This exam was performed according to the departmental dose-optimization program which includes automated exposure control, adjustment of the mA and/or kV according to patient size and/or use of iterative reconstruction technique. CONTRAST:  90mL OMNIPAQUE IOHEXOL 350 MG/ML SOLN COMPARISON:  No prior CTV, correlation is made with same day CTA. FINDINGS: Superior sagittal sinus: Normal. Straight sinus: Normal. Inferior sagittal sinus, vein of Galen and internal cerebral veins: Normal. Transverse sinuses: Filling defect in the distal right transverse sinus (series 4, image 108). Normal left transverse sinus. Sigmoid sinuses: Filling defect in the proximal and mid right sigmoid sinus (series 4, images 109-130), with reconstitution just proximal to the jugular bulb. The left sigmoid sinuses normal. 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  These results will be called to the ordering clinician or representative by the Radiologist Assistant, and communication documented in the PACS or [employer]. Electronically Signed   By: Merilyn Baba M.D.   On: 09/10/2021 22:24   US PELVIC COMPLETE WITH TRANSVAGINAL  Result Date: 08/21/2021 CLINICAL DATA:  Left lower quadrant pain, nausea and vomiting EXAM: TRANSABDOMINAL AND TRANSVAGINAL ULTRASOUND OF PELVIS DOPPLER ULTRASOUND OF OVARIES TECHNIQUE: Both transabdominal and transvaginal ultrasound examinations of the pelvis were performed. Transabdominal technique was performed for global imaging of the pelvis including uterus, ovaries, adnexal regions, and pelvic cul-de-sac. It was necessary to proceed with endovaginal exam following the transabdominal exam to visualize the uterus/endometrium and ovaries. Color and duplex Doppler ultrasound was utilized to evaluate blood flow to the ovaries. COMPARISON:  Same day CT FINDINGS: Uterus Measurements: 10.3 x 4.7 x 5.5 cm = volume: 140.0 mL. There is a large fundal uterine fibroid measuring 11.5 x 10.6 x 11.5 cm. Endometrium Thickness: 10 mm. This is considered abnormal if postmenopausal and can be normal if premenopausal. Right ovary Not visualized. Left ovary Measurements: 3.0 x 1.2 x 2.9 cm = volume: 5.26 mL. Normal appearance/no adnexal mass. Pulsed Doppler evaluation of both ovaries demonstrates normal low-resistance arterial and venous waveforms.  Other findings Trace simple appearing free fluid in the pelvis, likely physiologic. IMPRESSION: No evidence of left-sided ovarian torsion. The right ovary is not visualized. Large fundal uterine fibroid measuring up to 11.5 x 10.6 x 11.5 cm. Endometrial thickness measures 10 mm. This is normal if premenopausal but abnormal if postmenopausal. Electronically Signed   By: Maurine Simmering M.D.   On: 08/21/2021 16:02   CT Angio Chest/Abd/Pel for Dissection W and/or Wo Contrast  Result Date: 08/21/2021 CLINICAL DATA:   Chest pain, abdominal pain, radiating to back EXAM: CT ANGIOGRAPHY CHEST, ABDOMEN AND PELVIS TECHNIQUE: Noncontrast CT of the chest was initially obtained. Multidetector CT imaging through the chest, abdomen and pelvis was performed using the standard protocol during bolus administration of intravenous contrast. Multiplanar reconstructed images and MIPs were obtained and reviewed to evaluate the vascular anatomy. RADIATION DOSE REDUCTION: This exam was performed according to the departmental dose-optimization program which includes automated exposure control, adjustment of the mA and/or kV according to patient size and/or use of iterative reconstruction technique. CONTRAST:  166mL OMNIPAQUE IOHEXOL 350 MG/ML SOLN COMPARISON:  None. FINDINGS: CTA CHEST FINDINGS Cardiovascular: Heart size normal. No pericardial effusion. RV nondilated. Satisfactory opacification of pulmonary arteries noted, and there is no evidence of pulmonary emboli. Adequate contrast opacification of the thoracic aorta with no evidence of dissection, aneurysm, or stenosis. There is classic 3-vessel brachiocephalic arch anatomy without proximal stenosis. Mediastinum/Nodes: No mediastinal hematoma, mass or adenopathy. Lungs/Pleura: No pleural effusion. Lungs are clear. No pneumothorax. Musculoskeletal: There is a dextroscoliosis of the thoracic spine apex T9 without evident underlying vertebral anomaly. Bilateral breast implants. Review of the MIP images confirms the above findings. CTA ABDOMEN AND PELVIS FINDINGS VASCULAR Aorta: Normal caliber aorta without aneurysm, dissection, vasculitis or significant stenosis. Celiac: Patent without evidence of aneurysm, dissection, vasculitis or significant stenosis. SMA: Patent without evidence of aneurysm, dissection, vasculitis or significant stenosis. Renals: Both renal arteries are patent without evidence of aneurysm, dissection, vasculitis, fibromuscular dysplasia or significant stenosis. IMA: Patent  without evidence of aneurysm, dissection, vasculitis or significant stenosis. Inflow: Patent without evidence of aneurysm, dissection, vasculitis or significant stenosis. Markedly enlarged bilateral uterine arteries and distal branches supplying large uterine fibroids. Veins: No obvious venous abnormality within the limitations of this arterial phase study. Review of the MIP images confirms the above findings. NON-VASCULAR Hepatobiliary: No focal liver abnormality is seen. No gallstones, gallbladder wall thickening, or biliary dilatation. Pancreas: Unremarkable. No pancreatic ductal dilatation or surrounding inflammatory changes. Spleen: Normal in size without focal abnormality. Adrenals/Urinary Tract: Adrenal glands are unremarkable. Kidneys are normal, without renal calculi, focal lesion, or hydronephrosis. Bladder is unremarkable. Stomach/Bowel: Stomach incompletely distended, unremarkable. Small bowel decompressed. Normal appendix. The colon is nondilated, unremarkable. Lymphatic: No abdominal or pelvic adenopathy. Reproductive: Marked uterine enlargement containing multiple low-attenuation lesions presumably fibroids, with enlargement of bilateral uterine arterial supply. No adnexal mass. Other: No ascites.  No free air. Musculoskeletal: There is a mild compensatory levoscoliosis in the lumbar spine. Review of the MIP images confirms the above findings. IMPRESSION: 1. Negative for acute PE or thoracic aortic dissection. 2. Enlarged myomatous uterus 3. Moderate thoracic dextroscoliosis Electronically Signed   By: Lucrezia Europe M.D.   On: 08/21/2021 10:58   VAS [place] LOWER EXTREMITY VENOUS (DVT)  Result Date: 09/12/2021  Lower Venous DVT Study Patient Name:  April Reilly  Date of Exam:   09/12/2021 Medical Rec #: [id number]      Accession #:    [id number] Date of Birth: [birth year]  Patient Gender: F Patient Age:   51 years Exam Location:  [facility] Procedure:      VAS [place] LOWER EXTREMITY VENOUS (DVT)  Referring Phys: Janine Ores --------------------------------------------------------------------------------  Indications: Stroke.  Risk Factors: None identified. Comparison Study: No prior studies. Performing Technologist: Oliver Hum RVT  Examination Guidelines: A complete evaluation includes B-mode imaging, spectral Doppler, color Doppler, and power Doppler as needed of all accessible portions of each vessel. Bilateral testing is considered an integral part of a complete examination. Limited examinations for reoccurring indications may be performed as noted. The reflux portion of the exam is performed with the patient in reverse Trendelenburg.  +---------+---------------+---------+-----------+----------+--------------+  RIGHT     Compressibility Phasicity Spontaneity Properties Thrombus Aging  +---------+---------------+---------+-----------+----------+--------------+  CFV       Full            Yes       Yes                                    +---------+---------------+---------+-----------+----------+--------------+  SFJ       Full                                                             +---------+---------------+---------+-----------+----------+--------------+  FV Prox   Full                                                             +---------+---------------+---------+-----------+----------+--------------+  FV Mid    Full                                                             +---------+---------------+---------+-----------+----------+--------------+  FV Distal Full                                                             +---------+---------------+---------+-----------+----------+--------------+  PFV       Full                                                             +---------+---------------+---------+-----------+----------+--------------+  POP       Full            Yes       Yes                                     +---------+---------------+---------+-----------+----------+--------------+  PTV  Full                                                             +---------+---------------+---------+-----------+----------+--------------+  PERO      Full                                                             +---------+---------------+---------+-----------+----------+--------------+   +---------+---------------+---------+-----------+----------+--------------+  LEFT      Compressibility Phasicity Spontaneity Properties Thrombus Aging  +---------+---------------+---------+-----------+----------+--------------+  CFV       Full            Yes       Yes                                    +---------+---------------+---------+-----------+----------+--------------+  SFJ       Full                                                             +---------+---------------+---------+-----------+----------+--------------+  FV Prox   Full                                                             +---------+---------------+---------+-----------+----------+--------------+  FV Mid    Full                                                             +---------+---------------+---------+-----------+----------+--------------+  FV Distal Full            Yes       Yes                                    +---------+---------------+---------+-----------+----------+--------------+  PFV       Full                                                             +---------+---------------+---------+-----------+----------+--------------+  POP       Full            Yes       Yes                                    +---------+---------------+---------+-----------+----------+--------------+  PTV       Full                                                             +---------+---------------+---------+-----------+----------+--------------+  PERO      Full                                                              +---------+---------------+---------+-----------+----------+--------------+     Summary: RIGHT: - There is no evidence of deep vein thrombosis in the lower extremity.  - No cystic structure found in the popliteal fossa.  LEFT: - There is no evidence of deep vein thrombosis in the lower extremity.  - No cystic structure found in the popliteal fossa.  *See table(s) above for measurements and observations. Electronically signed by Harold Barban MD on 09/12/2021 at 10:29:07 PM.    Final    [place] EKG SITE RITE  Result Date: 09/10/2021 If [facility] image not attached, placement could not be confirmed due to current cardiac rhythm.    Subjective:   Discharge Exam: Vitals:   09/19/21 0758 09/19/21 0849  BP: 97/79 102/68  Pulse: 99 97  Resp: 16 16  Temp: (!) 97.5 F (36.4 C) 97.6 F (36.4 C)  SpO2: 100% 97%   Vitals:   09/19/21 0313 09/19/21 0500 09/19/21 0758 09/19/21 0849  BP: 97/67  97/79 102/68  Pulse: 84  99 97  Resp: 17  16 16   Temp: 98.8 F (37.1 C)  (!) 97.5 F (36.4 C) 97.6 F (36.4 C)  TempSrc: Oral  Oral Oral  SpO2: 99%  100% 97%  Weight:  60.9 kg    Height:        Physical Exam: GEN: NAD, alert and oriented x 3, wd/wn HEENT: NCAT, PERRL, EOMI, sclera clear, MMM PULM: CTAB w/o wheezes/crackles, normal respiratory effort CV: RRR w/o M/G/R GI: abd soft, NTND, NABS, no R/G/M MSK: no peripheral edema, muscle strength globally intact 5/5 bilateral upper/lower extremities NEURO: CN II-XII intact, no focal deficits, sensation to light touch intact PSYCH: normal mood/affect Integumentary: dry/intact, no rashes or wounds    The results of significant diagnostics from this hospitalization (including imaging, microbiology, ancillary and laboratory) are listed below for reference.     Microbiology: Recent Results (from the past 240 hour(s))  Resp Panel by RT-PCR (Flu A&B, Covid) Nasopharyngeal Swab     Status: None   Collection Time: 09/10/21  8:26 AM   Specimen:  Nasopharyngeal Swab; Nasopharyngeal(NP) swabs in vial transport medium  Result Value Ref Range Status   SARS Coronavirus 2 by RT PCR NEGATIVE NEGATIVE Final    Comment: (NOTE) SARS-CoV-2 target nucleic acids are NOT DETECTED.  The SARS-CoV-2 RNA is generally detectable in upper respiratory specimens during the acute phase of infection. The lowest concentration of SARS-CoV-2 viral copies this assay can detect is 138 copies/mL. A negative result does not preclude SARS-Cov-2 infection and should not be used as the sole basis for treatment or other patient management decisions. A negative result may occur with  improper specimen collection/handling, submission of specimen other than nasopharyngeal swab, presence  of viral mutation(s) within the areas targeted by this assay, and inadequate number of viral copies(<138 copies/mL). A negative result must be combined with clinical observations, patient history, and epidemiological information. The expected result is Negative.  Fact Sheet for Patients:  EntrepreneurPulse.com.au  Fact Sheet for Healthcare Providers:  IncredibleEmployment.be  This test is no t yet approved or cleared by the [place] FDA and  has been authorized for detection and/or diagnosis of SARS-CoV-2 by FDA under an Emergency Use Authorization (EUA). This EUA will remain  in effect (meaning this test can be used) for the duration of the COVID-19 declaration under Section 564(b)(1) of the Act, 21 U.S.C.section 360bbb-3(b)(1), unless the authorization is terminated  or revoked sooner.       Influenza A by PCR NEGATIVE NEGATIVE Final   Influenza B by PCR NEGATIVE NEGATIVE Final    Comment: (NOTE) The Xpert Xpress SARS-CoV-2/FLU/RSV plus assay is intended as an aid in the diagnosis of influenza from Nasopharyngeal swab specimens and should not be used as a sole basis for treatment. Nasal washings and aspirates are unacceptable for  Xpert Xpress SARS-CoV-2/FLU/RSV testing.  Fact Sheet for Patients: EntrepreneurPulse.com.au  Fact Sheet for Healthcare Providers: IncredibleEmployment.be  This test is not yet approved or cleared by the [place] FDA and has been authorized for detection and/or diagnosis of SARS-CoV-2 by FDA under an Emergency Use Authorization (EUA). This EUA will remain in effect (meaning this test can be used) for the duration of the COVID-19 declaration under Section 564(b)(1) of the Act, 21 U.S.C. section 360bbb-3(b)(1), unless the authorization is terminated or revoked.  Performed at [facility], [place] [address], [place], [place] [address]   Urine Culture     Status: Abnormal   Collection Time: 09/10/21  8:29 AM   Specimen: In/Out Cath Urine  Result Value Ref Range Status   Specimen Description IN/OUT CATH URINE  Final   Special Requests   Final    NONE Performed at [facility], [place] [address] [place], [place] [address]    Culture MULTIPLE SPECIES PRESENT, SUGGEST RECOLLECTION (A)  Final   Report Status 09/11/2021 FINAL  Final  Blood Culture (routine x 2)     Status: None   Collection Time: 09/10/21  8:47 AM   Specimen: BLOOD LEFT HAND  Result Value Ref Range Status   Specimen Description BLOOD LEFT HAND  Final   Special Requests   Final    BOTTLES DRAWN AEROBIC ONLY Blood Culture results may not be optimal due to an inadequate volume of blood received in culture bottles   Culture   Final    NO GROWTH 5 DAYS Performed at [facility], [place] [address], [place], [place] [address]    Report Status 09/15/2021 FINAL  Final  Blood Culture (routine x 2)     Status: None   Collection Time: 09/10/21  8:47 AM   Specimen: BLOOD RIGHT HAND  Result Value Ref Range Status   Specimen Description BLOOD RIGHT HAND  Final   Special Requests   Final    BOTTLES DRAWN AEROBIC ONLY Blood Culture results may not be optimal due to an inadequate  volume of blood received in culture bottles   Culture   Final    NO GROWTH 5 DAYS Performed at [facility], [place] [address], [place], [place] [address]    Report Status 09/15/2021 FINAL  Final  MRSA Next Gen by PCR, Nasal     Status:  None   Collection Time: 09/10/21 12:50 PM   Specimen: Nasal Mucosa; Nasal Swab  Result Value Ref Range Status   MRSA by PCR Next Gen NOT DETECTED NOT DETECTED Final    Comment: (NOTE) The GeneXpert MRSA Assay (FDA approved for NASAL specimens only), is one component of a comprehensive MRSA colonization surveillance program. It is not intended to diagnose MRSA infection nor to guide or monitor treatment for MRSA infections. Test performance is not FDA approved in patients less than 71 years old. Performed at [facility], [place] [address], [place], [place] [address]      Labs: BNP (last 3 results) No results for input(s): BNP in the last 8760 hours. Basic Metabolic Panel: Recent Labs  Lab 09/12/21 1748 09/12/21 2215 09/13/21 0433 09/13/21 1027 09/14/21 0444 09/14/21 1003 09/14/21 1611 09/14/21 2319 09/15/21 0439 09/15/21 1008 09/16/21 0445  NA 157*   < > 157*   < > 153*   < > 149* 149* 147* 146* 142  K  --   --  3.1*  --  3.8  --   --   --  3.7  --  3.5  CL  --   --  124*  --  123*  --   --   --  115*  --  110  CO2  --   --  21*  --  23  --   --   --  23  --  23  GLUCOSE  --   --  152*  --  125*  --   --   --  161*  --  117*  BUN  --   --  11  --  13  --   --   --  15  --  10  CREATININE  --   --  0.68  --  0.65  --   --   --  0.72  --  0.65  CALCIUM  --   --  9.0  --  9.4  --   --   --  8.8*  --  9.1  MG 2.2  --   --   --   --   --   --   --   --   --   --   PHOS 3.9  --   --   --   --   --   --   --   --   --   --    < > = values in this interval not displayed.   Liver Function Tests: Recent Labs  Lab 09/16/21 1020  ALBUMIN 2.6*   No results for input(s): LIPASE, AMYLASE in the last 168 hours. No results for input(s):  AMMONIA in the last 168 hours. CBC: Recent Labs  Lab 09/14/21 0444 09/15/21 0439 09/16/21 0445 09/17/21 0430 09/18/21 0453  WBC 9.8 9.2 8.7 8.5 8.2  HGB 8.2* 7.2* 7.5* 7.5* 7.7*  HCT 28.8* 26.5* 25.2* 25.9* 26.0*  MCV 67.4* 68.8* 66.1* 67.6* 68.8*  PLT 294 272 268 280 286   Cardiac Enzymes: No results for input(s): CKTOTAL, CKMB, CKMBINDEX, TROPONINI in the last 168 hours. BNP: Invalid input(s): POCBNP CBG: Recent Labs  Lab 09/15/21 1110 09/16/21 0029 09/16/21 0433 09/16/21 0819 09/16/21 1150  GLUCAP 111* 120* 109* 112* 125*   D-Dimer No results for input(s): DDIMER in the last 72 hours. Hgb A1c No results for input(s): HGBA1C in the last 72 hours. Lipid Profile No results for input(s):  CHOL, HDL, LDLCALC, TRIG, CHOLHDL, LDLDIRECT in the last 72 hours. Thyroid function studies No results for input(s): TSH, T4TOTAL, T3FREE, THYROIDAB in the last 72 hours.  Invalid input(s): FREET3 Anemia work up No results for input(s): VITAMINB12, FOLATE, FERRITIN, TIBC, IRON, RETICCTPCT in the last 72 hours. Urinalysis    Component Value Date/Time   COLORURINE YELLOW 09/10/2021 1033   APPEARANCEUR HAZY (A) 09/10/2021 1033   LABSPEC 1.016 09/10/2021 1033   PHURINE 5.0 09/10/2021 1033   GLUCOSEU NEGATIVE 09/10/2021 1033   HGBUR MODERATE (A) 09/10/2021 1033   BILIRUBINUR NEGATIVE 09/10/2021 1033   KETONESUR 5 (A) 09/10/2021 1033   PROTEINUR 30 (A) 09/10/2021 1033   UROBILINOGEN 1.0 05/20/2021 1742   NITRITE NEGATIVE 09/10/2021 1033   LEUKOCYTESUR TRACE (A) 09/10/2021 1033   Sepsis Labs Invalid input(s): PROCALCITONIN,  WBC,  LACTICIDVEN Microbiology Recent Results (from the past 240 hour(s))  Resp Panel by RT-PCR (Flu A&B, Covid) Nasopharyngeal Swab     Status: None   Collection Time: 09/10/21  8:26 AM   Specimen: Nasopharyngeal Swab; Nasopharyngeal(NP) swabs in vial transport medium  Result Value Ref Range Status   SARS Coronavirus 2 by RT PCR NEGATIVE NEGATIVE Final     Comment: (NOTE) SARS-CoV-2 target nucleic acids are NOT DETECTED.  The SARS-CoV-2 RNA is generally detectable in upper respiratory specimens during the acute phase of infection. The lowest concentration of SARS-CoV-2 viral copies this assay can detect is 138 copies/mL. A negative result does not preclude SARS-Cov-2 infection and should not be used as the sole basis for treatment or other patient management decisions. A negative result may occur with  improper specimen collection/handling, submission of specimen other than nasopharyngeal swab, presence of viral mutation(s) within the areas targeted by this assay, and inadequate number of viral copies(<138 copies/mL). A negative result must be combined with clinical observations, patient history, and epidemiological information. The expected result is Negative.  Fact Sheet for Patients:  EntrepreneurPulse.com.au  Fact Sheet for Healthcare Providers:  IncredibleEmployment.be  This test is no t yet approved or cleared by the [place] FDA and  has been authorized for detection and/or diagnosis of SARS-CoV-2 by FDA under an Emergency Use Authorization (EUA). This EUA will remain  in effect (meaning this test can be used) for the duration of the COVID-19 declaration under Section 564(b)(1) of the Act, 21 U.S.C.section 360bbb-3(b)(1), unless the authorization is terminated  or revoked sooner.       Influenza A by PCR NEGATIVE NEGATIVE Final   Influenza B by PCR NEGATIVE NEGATIVE Final    Comment: (NOTE) The Xpert Xpress SARS-CoV-2/FLU/RSV plus assay is intended as an aid in the diagnosis of influenza from Nasopharyngeal swab specimens and should not be used as a sole basis for treatment. Nasal washings and aspirates are unacceptable for Xpert Xpress SARS-CoV-2/FLU/RSV testing.  Fact Sheet for Patients: EntrepreneurPulse.com.au  Fact Sheet for Healthcare  Providers: IncredibleEmployment.be  This test is not yet approved or cleared by the [place] FDA and has been authorized for detection and/or diagnosis of SARS-CoV-2 by FDA under an Emergency Use Authorization (EUA). This EUA will remain in effect (meaning this test can be used) for the duration of the COVID-19 declaration under Section 564(b)(1) of the Act, 21 U.S.C. section 360bbb-3(b)(1), unless the authorization is terminated or revoked.  Performed at [facility], [place] [address], [place], [place] [address]   Urine Culture     Status: Abnormal   Collection Time: 09/10/21  8:29 AM   Specimen: In/Out  Cath Urine  Result Value Ref Range Status   Specimen Description IN/OUT CATH URINE  Final   Special Requests   Final    NONE Performed at [facility], [address] [place], [place] [address]    Culture MULTIPLE SPECIES PRESENT, SUGGEST RECOLLECTION (A)  Final   Report Status 09/11/2021 FINAL  Final  Blood Culture (routine x 2)     Status: None   Collection Time: 09/10/21  8:47 AM   Specimen: BLOOD LEFT HAND  Result Value Ref Range Status   Specimen Description BLOOD LEFT HAND  Final   Special Requests   Final    BOTTLES DRAWN AEROBIC ONLY Blood Culture results may not be optimal due to an inadequate volume of blood received in culture bottles   Culture   Final    NO GROWTH 5 DAYS Performed at [facility], [place] [address], [place], [place] [address]    Report Status 09/15/2021 FINAL  Final  Blood Culture (routine x 2)     Status: None   Collection Time: 09/10/21  8:47 AM   Specimen: BLOOD RIGHT HAND  Result Value Ref Range Status   Specimen Description BLOOD RIGHT HAND  Final   Special Requests   Final    BOTTLES DRAWN AEROBIC ONLY Blood Culture results may not be optimal due to an inadequate volume of blood received in culture bottles   Culture   Final    NO GROWTH 5 DAYS Performed at [facility], [place] [address]  [place], [place] [address]    Report Status 09/15/2021 FINAL  Final  MRSA Next Gen by PCR, Nasal     Status: None   Collection Time: 09/10/21 12:50 PM   Specimen: Nasal Mucosa; Nasal Swab  Result Value Ref Range Status   MRSA by PCR Next Gen NOT DETECTED NOT DETECTED Final    Comment: (NOTE) The GeneXpert MRSA Assay (FDA approved for NASAL specimens only), is one component of a comprehensive MRSA colonization surveillance program. It is not intended to diagnose MRSA infection nor to guide or monitor treatment for MRSA infections. Test performance is not FDA approved in patients less than 26 years old. Performed at [facility], [place] [address] [place],  [address]      Time coordinating discharge: Over 30 minutes  SIGNED:   Tareva Leske J [place] DO  [facility] 09/19/2021, 11:37 AM

## 2021-09-19 NOTE — H&P (Signed)
Physical Medicine and Rehabilitation Admission H&P    Chief Complaint  Patient presents with   Functional decline     HPI:  April Reilly is a 51 year old female with history of SS trait, uterine fibroids, abnormal vaginal bleeding X 4 months otherwise in relatively good health who was admitted on 09/10/21 with 3 day history of malaise due to flu like symptoms with fall night prior to admission and progressive somnolence. Patient noted to be unresponsive with unequal pupils and dysconjugate gaze. She was started on broad spectrum antibiotics for fever and leucocytosis due to RLL aspiration PNA.  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  She was started on hypertonic saline and EEG done showing evidence of epileptogenicity with  high potential for seizures.    CTA head/heck showed multifocal narrowing in R>L A1, M1 and proximal R-M2 segments likely due to vasospasm and hypertensity and IPH in right cerebral hemispheres concerning for infarct with hemorrhagic conversion. CT venogram done showing subocclusive clot in distal right transverse sinus and right sigmoid dural venous sinus. She was started on IV heparin and neurosurgery contacted for input as MRI brain showed concerns of uncal herniation.  Conservative care recommended with serial CT head which showed increase in midline shift to 11 mm. She was loaded with dilantin and with LT_EEG showing evidence of epileptogenicity and cortical dysfunction with moderate diffuse encephalopathy.Keppra added 02/26 and dilatin discontinued.  2D echo done revealing EF 60-65% with no wall abnormality. Mentation has improved and megestrol resumed on 02/04 for ongoing bleeding due to uterine fibroids.   Hypertonic saline weaned off with improvement in bouts of lethargy and electrolyte abnormalities resolved. Swallow function  improved and she was advanced to regular diet. Repeat CTA/V done on 02/27 showing evolving hemorrhagic infarct with mild improvement in mass effect, no hydrocephalus and persistent but improved nonocclusive thrombus in right distal transverse and sigmoid sinus. Heparin transitioned to [place] on 02/28 and Neurology recommends 3-6 months of AC as well as close follow up with neurology. She has had issues with headaches managed with Fioricet and low dose  tramadol prn. Blood pressures have been on low side and she continues on metoprolol for management of tachycardia. She continues to have cognitive deficits with disorientation, left sided weakness with balance deficits and left inattention affecting ADLs and mobility. CIR recommended due to functional decline. Currently complains of weakness.    Review of Systems  Constitutional:  Negative for fever.  HENT:  Negative for hearing loss.   Eyes:  Negative for blurred vision and double vision.  Cardiovascular:  Negative for chest pain.  Gastrointestinal:  Negative for constipation and heartburn.  Genitourinary:  Negative for frequency.  Musculoskeletal:  Negative for myalgias.  Neurological:  Positive for focal weakness. Negative for dizziness and headaches.  Psychiatric/Behavioral:  Positive for memory loss.     Past Medical History:  Diagnosis Date   Asthma    Vaginal bleeding, abnormal    for 4 months    Past Surgical History:  Procedure Laterality Date   FOOT SURGERY     TUBAL LIGATION      Family History  Problem Relation Age of Onset   Clotting disorder Mother     Social History: Single. Lives alone and works as a Biomedical scientist at [employer] She  reports that she has never smoked. She has never used smokeless  tobacco. She reports current alcohol use-- once a week on average.   She reports that she does not use drugs.   Allergies: No Known Allergies   Medications Prior to Admission  Medication Sig Dispense Refill   apixaban (ELIQUIS) 5 MG TABS  tablet Take 1 tablet (5 mg total) by mouth 2 (two) times daily. 60 tablet    [START ON 09/20/2021] atorvastatin (LIPITOR) 40 MG tablet Take 1 tablet (40 mg total) by mouth daily.     ibuprofen (ADVIL) 200 MG tablet Take 200 mg by mouth every 6 (six) hours as needed for mild pain.     levETIRAcetam (KEPPRA) 500 MG tablet Take 1 tablet (500 mg total) by mouth 2 (two) times daily.     megestrol (MEGACE) 40 MG tablet Take 1 tablet (40 mg total) by mouth 2 (two) times daily. 60 tablet 3   metoprolol tartrate (LOPRESSOR) 25 MG tablet Take 1 tablet (25 mg total) by mouth 2 (two) times daily.     [START ON 09/20/2021] PARoxetine (PAXIL) 10 MG tablet Take 1 tablet (10 mg total) by mouth daily.     senna-docusate (SENOKOT-S) 8.6-50 MG tablet Take 2 tablets by mouth at bedtime.     traMADol (ULTRAM) 50 MG tablet Take 0.5 tablets (25 mg total) by mouth every 6 (six) hours as needed for moderate pain. 30 tablet      Home: Home Living Family/patient expects to be discharged to:: Private residence Living Arrangements: Alone Available Help at Discharge: Family, Available 24 hours/day (4 daughters and sister will rotate and provide 24/7 assist) Type of Home: House Home Access: Stairs to enter [employer] of Steps: 3 Entrance Stairs-Rails: None Home Layout: One level Bathroom Shower/Tub: Government social research officer Accessibility: Yes Additional Comments: pt is a limited historian due to lethargy and dysarthria  Lives With: Alone   Functional History: Prior Function Prior Level of Function : Independent/Modified Independent, Working/employed Mobility Comments: 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 with assist of PT pt initiated pulling self up to EOB, PT in front to prevent sliding off bed Transfers Overall transfer level: Needs assistance Equipment used: 2 person hand held assist Transfers: Sit to/from Stand Sit to Stand: Min assist, Mod assist Bed to/from chair/wheelchair/BSC transfer type:: Step pivot Stand pivot transfers: Mod assist, +2 physical assistance Step pivot transfers: +2 physical assistance, Mod assist General transfer comment: min initally, mod with increased fatigue with more support required on L Ambulation/Gait Ambulation/Gait assistance: Min assist, Mod assist Gait Distance (Feet): 15 Feet (x2) Assistive device: 1 person hand held assist, None Gait Pattern/deviations: Step-through pattern General Gait Details: generally unsteady with degradation due to fatigue.  lean to the L when needing to turn L.  Pt deferred increasing distance. Gait velocity interpretation: <1.31 ft/sec, indicative of household ambulator   ADL: ADL Overall ADL's : Needs assistance/impaired Grooming: Wash/dry face, Wash/dry hands, Cueing for safety, Cueing for sequencing, Minimal assistance Grooming Details (indicate cue type and reason): at the sink Lower Body Dressing: Moderate assistance, Sit to/from stand, Cueing for safety, Cueing for sequencing, Cueing for compensatory techniques Lower Body Dressing Details (indicate cue type and  reason): doffed in standing, abel to balance on one leg with min-mod for L LOB Toilet Transfer: Moderate assistance, Ambulation Toilet Transfer Details (indicate cue type and reason): walked from bed>bathroom wtih min A for L LOB, mod A for toilet transfer as pt attempted to sit too early and had L LOB. unable to correct with cues only Toileting- Water quality scientist and Hygiene: Min guard, Sitting/lateral lean Toileting - Clothing Manipulation Details (indicate cue type and reason):  +1 for standing, pt with BUE supported on sink, +1 for hygiene Functional mobility during ADLs: Minimal assistance, Moderate assistance General ADL Comments: Pt initally min A at the start of the session, as she fatigued she progressed to needing mod A   Cognition: Cognition Overall Cognitive Status: Impaired/Different from baseline Arousal/Alertness: Lethargic Orientation Level: Oriented X4 Attention: Focused, Sustained, Selective Focused Attention: Appears intact Sustained Attention: Appears intact Selective Attention: Impaired Selective Attention Impairment: Verbal basic, Functional basic Memory: Impaired Memory Impairment: Decreased long term memory, Storage deficit Decreased Long Term Memory: Verbal basic Awareness: Impaired Awareness Impairment: Intellectual impairment, Emergent impairment Problem Solving: Appears intact Executive Function: Self Monitoring, Self Correcting Safety/Judgment: Impaired Cognition Arousal/Alertness: Awake/alert Behavior During Therapy: Impulsive Overall Cognitive Status: Impaired/Different from baseline Area of Impairment: Safety/judgement, Attention, Awareness, Problem solving, Following commands Orientation Level: Disoriented to, Time Current Attention Level: Focused Memory: Decreased short-term memory, Decreased recall of precautions Following Commands: Follows one step commands consistently Safety/Judgement: Decreased awareness of deficits, Decreased awareness of safety Awareness: Emergent Problem Solving: Slow processing, Decreased initiation, Requires verbal cues, Difficulty sequencing, Requires tactile cues General Comments: alert & oriented. easily distracted internally and by environmental distractions, often tangential and requires re-driection to the task. Able to follow simple one step cues but unable to carry out multi-step directional task.    Blood pressure 109/64, pulse 90, temperature 98.4 F (36.9 C), temperature source Oral,  resp. rate 18, SpO2 98 %. Physical Exam Vitals and nursing note reviewed.  Constitutional:      Appearance: Normal appearance.  HEENT: [place], AT Cardiac: RR Pulmonary: breathing comfortably on room air. Abdomen: NT, ND Neurological:     Mental Status: She is alert and oriented to person, place, and time.     Comments: Speech clear but distracted by pain. She was able to follow simple motor commands. Had difficulty recalling medical history. LUE weakness noted.    Results for orders placed or performed during the hospital encounter of 09/10/21 (from the past 48 hour(s))  Heparin level (unfractionated)     Status: None   Collection Time: 09/18/21  4:53 AM  Result Value Ref Range   Heparin Unfractionated 0.44 0.30 - 0.70 IU/mL    Comment: (NOTE) The clinical reportable range upper limit is being lowered to >1.10 to align with the FDA approved guidance for the current laboratory assay.  If heparin results are below expected values, and patient dosage has  been confirmed, suggest follow up testing of antithrombin III levels. Performed at [facility], [place] [address] [place] [address]   CBC     Status: Abnormal   Collection Time: 09/18/21  4:53 AM  Result Value Ref Range   WBC 8.2 4.0 - 10.5 K/uL   RBC 3.78 (L) 3.87 - 5.11 MIL/uL   Hemoglobin 7.7 (L) 12.0 - 15.0 g/dL    Comment: Reticulocyte Hemoglobin testing may be clinically indicated, consider ordering this additional test [id number]    HCT 26.0 (L) 36.0 - 46.0 %   MCV 68.8 (L) 80.0 - 100.0  fL   MCH 20.4 (L) 26.0 - 34.0 pg   MCHC 29.6 (L) 30.0 - 36.0 g/dL   RDW 23.1 (H) 11.5 - 15.5 %   Platelets 286 150 - 400 K/uL   nRBC 3.2 (H) 0.0 - 0.2 %    Comment: Performed at [facility] [address], [place], [place] [address]   No results found.    Blood pressure 109/64, pulse 90, temperature 98.4 F (36.9 C), temperature source Oral, resp. rate 18, SpO2 98 %.  Medical Problem List and Plan: 1.  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  Antithrombotics: -DVT/anticoagulation:  Pharmaceutical: Other (comment)--Eliquis  -antiplatelet therapy: N/A 3. Headaches: Continue Fioricet (has been using TID on average). Check magnesium level tomorrow.  --May need Topamax as continues to report significant headaches.  4. Mood:  LCSW to follow for evaluation and support.   -antipsychotic agents: N/a 5. Neuropsych: This patient is not fully  capable of making decisions on his own behalf. 6. Skin/Wound Care: Routine pressure relief measures.  7. Fluids/Electrolytes/Nutrition: Monitor I/O. Check CMET in am. 8. SS trait/ABLA: Menses X 4 months due to bleeding fibroids. Continue to monitor H/H as now on Ac.  9. Concern for Seizure: Continue Keppra 500 mg bid.  10. Tachycardia: Continue metoprolol bid.   --BP soft in high 90-low 100 range without symptoms.  11. Hyperlipidemia: Continue Lipitor.    I have personally performed a face to face diagnostic evaluation, including, but not limited to relevant history and physical exam findings, of this patient and developed relevant assessment and plan.  Additionally, I have reviewed and concur with the physician assistant's documentation above.  Bary Leriche, PA-C   Izora Ribas, MD 09/19/2021

## 2021-09-19 NOTE — Discharge Instructions (Signed)

## 2021-09-19 NOTE — Progress Notes (Signed)
Occupational Therapy Treatment Patient Details Name: April Reilly MRN: [id number] DOB: 09/07/1970 Today's Date: 09/19/2021   History of present illness 51 y.o. female presents to [facility] hospital on 09/10/2021 after fall out of bed followed by AMS. CTH with significant edema in R frontal, parietal, occipital and temporal lobe with superimposed hemorrhage, started on hypertonic saline solution. PMH includes sickle cell trait, anemia.   OT comments  Pt is making excellent progress towards her acute goals. She was able to transfer and ambulate without AD this session given min -mod A for L LOB and cues for attention and safety. Pt also completed standing ADLs with min-mod A. Pt able to use her LUE functionally with cues for attention. She remains limited by impaired cognition, L hemiparesis and L LOB. Pt has active plans to d/c to CIR today.    Recommendations for follow up therapy are one component of a multi-disciplinary discharge planning process, led by the attending physician.  Recommendations may be updated based on patient status, additional functional criteria and insurance authorization.    Follow Up Recommendations  Acute inpatient rehab (3hours/day)    Assistance Recommended at Discharge Frequent or constant Supervision/Assistance  Patient can return home with the following  Two people to help with walking and/or transfers;Two people to help with bathing/dressing/bathroom;Assistance with feeding;Assistance with cooking/housework;Direct supervision/assist for medications management;Direct supervision/assist for financial management;Assist for transportation;Help with stairs or ramp for entrance   Equipment Recommendations  BSC/3in1;Tub/shower bench;Wheelchair (measurements OT);Wheelchair cushion (measurements OT);Hospital bed    Recommendations for Other Services Rehab consult    Precautions / Restrictions Precautions Precautions: Fall Precaution Comments: L sided  inattention Restrictions Weight Bearing Restrictions: No       Mobility Bed Mobility Overal bed mobility: Needs Assistance Bed Mobility: Supine to Sit, Sit to Supine     Supine to sit: Min assist Sit to supine: Min assist        Transfers Overall transfer level: Needs assistance   Transfers: Sit to/from Stand Sit to Stand: Min assist, Mod assist           General transfer comment: min initally, mod with increased fatigue with more support required on L     Balance Overall balance assessment: Needs assistance Sitting-balance support: Feet supported Sitting balance-Leahy Scale: Fair       Standing balance-Leahy Scale: Fair Standing balance comment: fair initially and progressed to poor with fatigue                           ADL either performed or assessed with clinical judgement   ADL Overall ADL's : Needs assistance/impaired     Grooming: Wash/dry face;Wash/dry hands;Cueing for safety;Cueing for sequencing;Minimal assistance Grooming Details (indicate cue type and reason): at the sink             Lower Body Dressing: Moderate assistance;Sit to/from stand;Cueing for safety;Cueing for sequencing;Cueing for compensatory techniques Lower Body Dressing Details (indicate cue type and reason): doffed in standing, abel to balance on one leg with min-mod for L LOB Toilet Transfer: Moderate assistance;Ambulation Toilet Transfer Details (indicate cue type and reason): walked from bed>bathroom wtih min A for L LOB, mod A for toilet transfer as pt attempted to sit too early and had L LOB. unable to correct with cues only Toileting- Water quality scientist and Hygiene: Min guard;Sitting/lateral lean       Functional mobility during ADLs: Minimal assistance;Moderate assistance General ADL Comments: Pt initally min A at the start of the  session, as she fatigued she progressed to needing mod A    Extremity/Trunk Assessment Upper Extremity Assessment Upper  Extremity Assessment: LUE deficits/detail LUE Deficits / Details: moving well against gravity, using funcitonally with cues for attention to L LUE Sensation: decreased light touch LUE Coordination: decreased fine motor;decreased gross motor   Lower Extremity Assessment Lower Extremity Assessment: Defer to PT evaluation        Vision   Vision Assessment?: Vision impaired- to be further tested in functional context Additional Comments: L inattention?   Perception Perception Perception: Not tested   Praxis Praxis Praxis: Not tested    Cognition Arousal/Alertness: Awake/alert Behavior During Therapy: Impulsive Overall Cognitive Status: Impaired/Different from baseline Area of Impairment: Safety/judgement, Attention, Awareness, Problem solving, Following commands                   Current Attention Level: Focused Memory: Decreased short-term memory, Decreased recall of precautions Following Commands: Follows one step commands consistently Safety/Judgement: Decreased awareness of deficits, Decreased awareness of safety Awareness: Emergent Problem Solving: Slow processing, Decreased initiation, Requires verbal cues, Difficulty sequencing, Requires tactile cues General Comments: alert & oriented. easily distracted internally and by environmental distractions, often tangential and requires re-driection to the task. Able to follow simple one step cues but unable to carry out multi-step directional task.        Exercises      Shoulder Instructions       General Comments VSS on RA    Pertinent Vitals/ Pain       Pain Assessment Pain Assessment: No/denies pain Pain Intervention(s): Monitored during session  Home Living                                          Prior Functioning/Environment              Frequency  Min 2X/week        Progress Toward Goals  OT Goals(current goals can now be found in the care plan section)  Progress  towards OT goals: Progressing toward goals  Acute Rehab OT Goals Patient Stated Goal: to get out of here OT Goal Formulation: Patient unable to participate in goal setting Time For Goal Achievement: 09/26/21 Potential to Achieve Goals: Good ADL Goals Pt Will Perform Grooming: with mod assist;sitting Pt Will Perform Upper Body Bathing: with mod assist;sitting Pt Will Perform Lower Body Bathing: with mod assist;sitting/lateral leans Pt Will Transfer to Toilet: with +2 assist;with mod assist;bedside commode;squat pivot transfer Additional ADL Goal #1: Pt will maintain midline postural control EOB with mod A in preparation for ADL tasks Additional ADL Goal #2: Pt will identify 3/5 targets in L visual field wtih min vc in nondistracting environment  Plan Discharge plan remains appropriate    Co-evaluation                 AM-PAC OT "6 Clicks" Daily Activity     Outcome Measure   Help from another person eating meals?: A Little Help from another person taking care of personal grooming?: A Lot Help from another person toileting, which includes using toliet, bedpan, or urinal?: A Lot Help from another person bathing (including washing, rinsing, drying)?: A Lot Help from another person to put on and taking off regular upper body clothing?: A Lot Help from another person to put on and taking off regular lower body clothing?: A Lot 6 Click  Score: 13    End of Session    OT Visit Diagnosis: Unsteadiness on feet (R26.81);Other abnormalities of gait and mobility (R26.89);Muscle weakness (generalized) (M62.81);Low vision, both eyes (H54.2);Apraxia (R48.2);Other symptoms and signs involving the nervous system (R29.898);Other symptoms and signs involving cognitive function;Hemiplegia and hemiparesis Hemiplegia - Right/Left: Left Hemiplegia - dominant/non-dominant: Non-Dominant Hemiplegia - caused by: Cerebral infarction;Nontraumatic intracerebral hemorrhage   Activity Tolerance Patient  tolerated treatment well   Patient Left in bed;with call bell/phone within reach;with bed alarm set   Nurse Communication Mobility status        Time: 1340-1404 OT Time Calculation (min): 24 min  Charges: OT General Charges $OT Visit: 1 Visit   Paulena Servais A Sabin Gibeault 09/19/2021, 2:57 PM

## 2021-09-19 NOTE — Progress Notes (Signed)
Izora Ribas, MD  Physician Physical Medicine and Rehabilitation PMR Pre-admission     Signed Date of Service:  09/19/2021 11:27 AM  Related encounter: ED to Hosp-Admission (Current) from 09/10/2021 in [facility]   Signed      Show:Clear all '[x]' Written'[x]' Templated'[x]' Copied  Added by: '[x]' Cristina Gong, RN'[x]' Raulkar, Clide Deutscher, MD  '[]' Hover for details                                                                                                                                                                                                                                                                                                                                        PMR Admission Coordinator Pre-Admission Assessment   Patient: April Reilly is an 51 y.o., female MRN: [id number] DOB: 09/07/1970 Height: '4\' 11"'  (149.9 cm) Weight: 60.9 kg   Insurance Information HMO:     PPO:      PCP:      IPA:      80/20:      OTHER:  PRIMARYIval Bible      Policy#: [id number]      Subscriber: pt CM Name: Gasper Lloyd      Phone#: [phone]     Fax#: [phone] Pre-Cert#: [id number] approved until 3/8      Employer: [employer] Benefits:  Phone #: [phone]     Name: 2/28 Eff. Date: 07/22/2021     Deduct: $1500      Out of Pocket Max: $4000      Life Max: none CIR: $500 per admission then 80%      SNF: 80% 120 days Outpatient: 80%     Co-Pay: medical neccesity review after 25 visits Home Health: 80%      Co-Pay: visits per medical neccesity review DME: 80%     Co-Pay: 20% Providers: in network  SECONDARY: none   Financial Counselor:  Phone#:    The Data Collection Information Summary for patients in Inpatient Rehabilitation Facilities with attached Privacy Act [place] Records was provided and verbally reviewed with: N/A    Emergency Contact Information Contact Information       Name Relation Home Work Brooks Daughter     [phone]    Pricilla Handler     [phone]         Current Medical History  Patient Admitting Diagnosis: CVA   History of Present Illness: 51 year old female with history of sickle cell trait, heavy vaginal bleeding and anemia from uterine fibroids presented on 09/10/21 after an unwitnessed fall out of bed the night before and a 3 day history of flu like symptoms and worsening AMS. CT head showed significant edema in right frontal, parietal , occipital and temporal lobe with superimposed hemorrhage. Neurology consulted.   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 Heparin stroke protocol initiated and hypertonic saline for cerebral edema. Transitioned to Pradaxa. MRI shows large area of cortical infarction in the right temporal , occipital parietal and posterior frontal lobes with signs associated edema and remonstrated intra parenchymal hemorrhage. [place] lower extremities negative for DVT. ECHO WNL. Repeat CT venogram and CT angio head with and without contrast showed evolving hemorrhagic infarction primarily involving temporal and occipital lobes with extension into the parietal lobe. No new hemorrhage. Mass effect stable and improved. Persistent but improved nonocclusive thrombus within the right distal transverse and sigmoid sinuses. Seizure free and on Keppra.Resume statin for HLD. Iron deficiency anemia and plans to transfuse to keep Hgb greater than 7.   Complete NIHSS TOTAL: 6   Patient's medical record from [facility] has been reviewed by the rehabilitation admission coordinator and physician.   Past Medical History      Past Medical History:  Diagnosis Date   Asthma      Has the patient had major surgery during 100 days prior to admission? Yes   Family History   Family history is  unknown by patient.   Current Medications   Current Facility-Administered Medications:    acetaminophen (TYLENOL) tablet 650 mg, 650 mg, Oral, Q4H PRN, 650 mg at 09/16/21 1745 **OR** acetaminophen (TYLENOL) 160 MG/5ML solution 650 mg, 650 mg, Per Tube, Q4H PRN, 650 mg at 09/15/21 0435 **OR** acetaminophen (TYLENOL) suppository 650 mg, 650 mg, Rectal, Q4H PRN, Kerney Elbe, MD, 650 mg at 09/10/21 2105   apixaban (ELIQUIS) tablet 5 mg, 5 mg, Oral, BID, [place] Eric J, DO   atorvastatin (LIPITOR) tablet 40 mg, 40 mg, Oral, Daily, Donnamae Jude, RPH, 40 mg at 09/19/21 0851   butalbital-acetaminophen-caffeine (FIORICET) 50-325-40 MG per tablet 1 tablet, 1 tablet, Oral, Q6H PRN, Janine Ores, NP, 1 tablet at 09/18/21 2331   chlorhexidine (PERIDEX) 0.12 % solution 15 mL, 15 mL, Mouth Rinse, BID, Agarwala, Ravi, MD, 15 mL at 09/19/21 0852   Chlorhexidine Gluconate Cloth 2 % PADS 6 each, 6 each, Topical, Q0600, Kerney Elbe, MD, 6 each at 09/19/21 0629   feeding supplement (ENSURE ENLIVE / ENSURE PLUS) liquid 237 mL, 237 mL, Oral, BID BM, Karleen Hampshire, Vijaya, MD   labetalol (NORMODYNE) injection 10 mg, 10 mg, Intravenous, Q10 min PRN, Erick Colace, NP, 10 mg at 09/12/21 1515   levETIRAcetam (KEPPRA) tablet 500 mg, 500 mg, Oral, BID, Dorene Grebe, MD, 500 mg at 09/19/21 0851   MEDLINE mouth rinse, 15 mL, Mouth  Rinse, q12n4p, Agarwala, Einar Grad, MD, 15 mL at 09/16/21 1746   megestrol (MEGACE) tablet 40 mg, 40 mg, Oral, BID, Donnamae Jude, RPH, 40 mg at 09/19/21 0851   metoprolol tartrate (LOPRESSOR) tablet 25 mg, 25 mg, Oral, BID, Donnamae Jude, RPH, 25 mg at 09/19/21 6659   PARoxetine (PAXIL) tablet 10 mg, 10 mg, Oral, Daily, Palikh, Gaurang M, MD, 10 mg at 09/19/21 9357   senna-docusate (Senokot-S) tablet 2 tablet, 2 tablet, Oral, QHS, Donnamae Jude, [facility], 2 tablet at 09/15/21 2235   sodium chloride flush (NS) 0.9 % injection 10-40 mL, 10-40 mL, Intracatheter, Q12H, Kerney Elbe, MD, 10 mL at 09/19/21  0177   sodium chloride flush (NS) 0.9 % injection 10-40 mL, 10-40 mL, Intracatheter, PRN, Kerney Elbe, MD   traMADol Veatrice Bourbon) tablet 25 mg, 25 mg, Oral, Q6H PRN, Amie Portland, MD, 25 mg at 09/18/21 1709   Patients Current Diet:  Diet Order                  Diet regular Room service appropriate? Yes; Fluid consistency: Thin  Diet effective now                       Precautions / Restrictions Precautions Precautions: Fall Precaution Comments: L sided inattention Restrictions Weight Bearing Restrictions: No    Has the patient had 2 or more falls or a fall with injury in the past year? 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 Independent   Indoor Mobility: Did the patient need assistance with walking from room to room (with or without device)? Independent   Stairs: Did the patient need assistance with internal or external stairs (with or without device)? Independent   Functional Cognition: Did the patient need help planning regular tasks such as shopping or remembering to take medications? Independent   Patient Information Are you of Hispanic, Latino/a,or Spanish origin?: A. No, not of Hispanic, Latino/a, or Spanish origin What is your race?: B. Black or African American Do you need or want an interpreter to communicate with a doctor or health care staff?: 0. No   Patient's Response To:  Health Literacy and Transportation Is the patient able to respond to health literacy and transportation needs?: Yes Health Literacy - How often do you need to have someone help you when you read instructions, pamphlets, or other written material from your doctor or pharmacy?: Never In the past 12 months, has lack of transportation kept you from medical appointments or from getting  medications?: No In the past 12 months, has lack of transportation kept you from meetings, work, or from getting things needed for daily living?: No   Development worker, international aid / [place] Devices/Equipment: None   Prior Device Use: Indicate devices/aids used by the patient prior to current illness, exacerbation or injury? None of the above   Current Functional Level Cognition   Arousal/Alertness: Lethargic Overall Cognitive Status: Impaired/Different from baseline Current Attention Level: Focused (easily distracted, tangential) Orientation Level: Oriented X4 Following Commands: Follows one step commands consistently Safety/Judgement: Decreased awareness of deficits, Decreased awareness of safety (L sided neglect) General Comments: Pt alert and oriented however continues with severely impaired insight to deficits and remain unrealistic of her functional capabilities at this time. Pt easily distracted and forgets  task at hand required freq verbal cues, difficulty processing/sequencing how to do tasks with R hand only Attention: Focused, Sustained, Selective Focused Attention: Appears intact Sustained Attention: Appears intact Selective Attention: Impaired Selective Attention Impairment: Verbal basic, Functional basic Memory: Impaired Memory Impairment: Decreased long term memory, Storage deficit Decreased Long Term Memory: Verbal basic Awareness: Impaired Awareness Impairment: Intellectual impairment, Emergent impairment Problem Solving: Appears intact Executive Function: Self Monitoring, Self Correcting Safety/Judgment: Impaired    Extremity Assessment (includes Sensation/Coordination)   Upper Extremity Assessment: LUE deficits/detail LUE Deficits / Details: No AROM noted this date, patient very lethargic.  Minor flexor tone noted with PROM to bicep. LUE Sensation: decreased light touch LUE Coordination: decreased fine motor, decreased gross motor  Lower Extremity  Assessment: Defer to PT evaluation LLE Deficits / Details: pt appears to have a flicker of movement into knee extension, pt withdraws to pain. PT notes hip adductor tone     ADLs   Overall ADL's : Needs assistance/impaired Grooming: Wash/dry face, Maximal assistance, Standing Grooming Details (indicate cue type and reason): max A for L side support in sitting adn cues to sustain task. using wipe in R hand Lower Body Dressing: Maximal assistance, +2 for physical assistance, +2 for safety/equipment Lower Body Dressing Details (indicate cue type and reason): changes mesh underwear and pad Toilet Transfer: Maximal assistance, +2 for physical assistance, +2 for safety/equipment, BSC/3in1, Stand-pivot Toilet Transfer Details (indicate cue type and reason): stood from recliner and BSC placed behind Toileting- Clothing Manipulation and Hygiene: Maximal assistance, +2 for physical assistance, +2 for safety/equipment, Sit to/from stand Toileting - Clothing Manipulation Details (indicate cue type and reason): +1 for standing, pt with BUE supported on sink, +1 for hygiene Functional mobility during ADLs: Maximal assistance, +2 for physical assistance, +2 for safety/equipment General ADL Comments: multiple stand pivots this session with max A +2, poor attention and requires constant re-direction. Joking with dughter throughout & internall distracted by head ache. L inattention     Mobility   Overal bed mobility: Needs Assistance Bed Mobility: Supine to Sit Rolling: Mod assist Sidelying to sit: Mod assist Supine to sit: HOB elevated, Mod assist Sit to supine: Total assist General bed mobility comments: pt laying diagonally in bed with legs out the side below bed rail. with assist of PT pt initiated pulling self up to EOB, PT in front to prevent sliding off bed     Transfers   Overall transfer level: Needs assistance Equipment used: 2 person hand held assist Transfers: Sit to/from Stand, Bed to  chair/wheelchair/BSC Sit to Stand: +2 physical assistance, Mod assist Bed to/from chair/wheelchair/BSC transfer type:: Step pivot Stand pivot transfers: Mod assist, +2 physical assistance Step pivot transfers: +2 physical assistance, Mod assist General transfer comment: completed many sit to stand up to back of recliner. Tactile cues by PT at knee and posterior hip to engage muscles as pt with L sided neglect. pt with good power up, modA once instanding to prevent L knee buckling. by 4th stand pt able to stand and perform lateral weight shift L/R without L knee buckling, tactile cues at chest to stand up right. pt able to complete step to pvt to chair with max directional sequencing cues, no L knee buckling and min/modAx2     Ambulation / Gait / Stairs / Wheelchair Mobility   Ambulation/Gait General Gait Details: worked on marching in place while holding onto back of chair     Posture / Balance Dynamic Sitting Balance Sitting balance - Comments: pt initially  maxA at EOB with strong L lateral lean, pt progressed to close min G and was able to maintain midline much better today with verbal cues Balance Overall balance assessment: Needs assistance Sitting-balance support: Feet supported Sitting balance-Leahy Scale: Poor Sitting balance - Comments: pt initially maxA at EOB with strong L lateral lean, pt progressed to close min G and was able to maintain midline much better today with verbal cues Postural control: Left lateral lean Standing balance support: Bilateral upper extremity supported Standing balance-Leahy Scale: Poor Standing balance comment: mod/maxA     Special needs/care consideration Decreased safety awareness    Previous Home Environment  Living Arrangements: Alone  Lives With: Alone Available Help at Discharge: Family, Available 24 hours/day (4 daughters and sister will rotate and provide 24/7 assist) Type of Home: House Home Layout: One level Home Access: Stairs to  enter Entrance Stairs-Rails: None Entrance Stairs-Number of Steps: 3 Bathroom Shower/Tub: Optometrist: Yes How Accessible: Accessible via walker Home Care Services: No Additional Comments: pt is a limited historian due to lethargy and dysarthria   Discharge Living Setting Plans for Discharge Living Setting: Patient's home, Alone Type of Home at Discharge: House Discharge Home Layout: One level Discharge Home Access: Stairs to enter Entrance Stairs-Rails: None Entrance Stairs-Number of Steps: 3 Discharge Bathroom Shower/Tub: Tub/shower unit Discharge Bathroom Toilet: Standard Discharge Bathroom Accessibility: Yes How Accessible: Accessible via walker Does the patient have any problems obtaining your medications?: No   Social/Family/Support Systems Patient Roles: Parent (fulltime [employer] employee) Contact Information: daughter, Edward Jolly Katha Cabal) Anticipated Caregiver: 4 daughters and sister to rotate to asisst Anticipated Caregiver's Contact Information: see contacts Ability/Limitations of Caregiver: daughters work but will make rotating schedule per [employer] Caregiver Availability: 24/7 Discharge Plan Discussed with Primary Caregiver: Yes Is Caregiver In Agreement with Plan?: Yes Does Caregiver/Family have Issues with Lodging/Transportation while Pt is in Rehab?: No   Goals Patient/Family Goal for Rehab: supervision to min assist with PT, OT and SLP Expected length of stay: ELOS 10 to 14 days Additional Information: decreased safety awareness and awareness of deficits Pt/Family Agrees to Admission and willing to participate: Yes Program Orientation Provided & Reviewed with Pt/Caregiver Including Roles  & Responsibilities: Yes   Decrease burden of Care through IP rehab admission: n/a   Possible need for SNF placement upon discharge: not anticipated   Patient Condition: I have reviewed medical records from [facility]  , spoken with CM, and patient and daughter. I met with patient at the bedside for inpatient rehabilitation assessment.  Patient will benefit from ongoing PT, OT, and SLP, can actively participate in 3 hours of therapy a day 5 days of the week, and can make measurable gains during the admission.  Patient will also benefit from the coordinated team approach during an Inpatient Acute Rehabilitation admission.  The patient will receive intensive therapy as well as Rehabilitation physician, nursing, social worker, and care management interventions.  Due to bladder management, bowel management, safety, skin/wound care, disease management, medication administration, pain management, and patient education the patient requires 24 hour a day rehabilitation nursing.  The patient is currently mod assist overall with max cues with mobility and basic ADLs.  Discharge setting and therapy post discharge at home with home health is anticipated.  Patient has agreed to participate in the Acute Inpatient Rehabilitation Program and will admit today.   Preadmission Screen Completed By:  Cleatrice Burke, 09/19/2021 11:27 AM ______________________________________________________________________   Discussed status with Dr. Ranell Patrick  on 09/19/2021 at 1142 and received approval for admission today.   Admission Coordinator:  Cleatrice Burke, RN, time 8338 Date 09/19/2021    Assessment/Plan: Diagnosis: ICH Does the need for close, 24 hr/day Medical supervision in concert with the patient's rehab needs make it unreasonable for this patient to be served in a less intensive setting? Yes Co-Morbidities requiring supervision/potential complications: cerebral edema, overweight, acute metabolic encephalopathy, acute cerebral venous thrombosis, HTN Due to bladder management, bowel management, safety, skin/wound care, disease management, medication administration, pain management, and patient education, does the patient require 24  hr/day rehab nursing? Yes Does the patient require coordinated care of a physician, rehab nurse, PT, OT, and SLP to address physical and functional deficits in the context of the above medical diagnosis(es)? Yes Addressing deficits in the following areas: balance, endurance, locomotion, strength, transferring, bowel/bladder control, bathing, dressing, feeding, grooming, toileting, cognition, and psychosocial support Can the patient actively participate in an intensive therapy program of at least 3 hrs of therapy 5 days a week? Yes The potential for patient to make measurable gains while on inpatient rehab is fair Anticipated functional outcomes upon discharge from inpatient rehab: supervision PT, supervision OT, supervision SLP Estimated rehab length of stay to reach the above functional goals is: 12-16 days Anticipated discharge destination: Home 10. Overall Rehab/Functional Prognosis: fair     MD Signature: Leeroy Cha, MD         Revision History                     Note Details  Author Izora Ribas, MD File Time 09/19/2021  1:00 PM  Author Type Physician Status Signed  Last Editor Izora Ribas, MD Service Physical Medicine and Rehabilitation

## 2021-09-19 NOTE — Progress Notes (Signed)
OT Cancellation Note ? ?Patient Details ?Name: April Reilly ?MRN: [id number] ?DOB: Nov 05, 1970 ? ? ?Cancelled Treatment:    Reason Eval/Treat Not Completed: Patient at procedure or test/ unavailable (Pt's PICC line being removed upon arrival, OT treat to f/u as able.) ? ?Jarad Barth A Maryl Blalock ?09/19/2021, 12:11 PM ?

## 2021-09-19 NOTE — Assessment & Plan Note (Signed)
Patient has establish care in the area with GYN.  Continue Megace 40 mg p.o. twice daily. ?

## 2021-09-19 NOTE — TOC Benefit Eligibility Note (Signed)
Patient Advocate Encounter ? ?Insurance verification completed.   ? ?The patient is currently admitted and upon discharge could be taking Eliquis 5 mg. ? ?The current 30 day co-pay is, $104.36.  ? ?The patient is insured through [employer]  ? ? ? ?Lyndel Safe, CPhT ?Pharmacy Patient Advocate Specialist ?[place] Patient Advocate Team ?Direct Number: [phone]  Fax: [phone] ? ? ? ? ? ?  ?

## 2021-09-19 NOTE — TOC Transition Note (Signed)
Transition of Care (TOC) - CM/SW Discharge Note ? ? ?Patient Details  ?Name: April Reilly ?MRN: [id number] ?Date of Birth: Dec 17, 1970 ? ?Transition of Care (TOC) CM/SW Contact:  ?Pollie Friar, RN ?Phone Number: ?09/19/2021, 1:11 PM ? ? ?Clinical Narrative:    ?Patient is discharging to CIR today. CM signing off.  ? ? ?Final next level of care: [place] ?Barriers to Discharge: No Barriers Identified ? ? ?Patient Goals and CMS Choice ?  ?  ?Choice offered to / list presented to : Patient ? ?Discharge Placement ?  ?           ?  ?  ?  ?  ? ?Discharge Plan and Services ?  ?  ?           ?  ?  ?  ?  ?  ?  ?  ?  ?  ?  ? ?Social Determinants of Health (SDOH) Interventions ?  ? ? ?Readmission Risk Interventions ?No flowsheet data found. ? ? ? ? ?

## 2021-09-20 ENCOUNTER — Other Ambulatory Visit: Payer: Self-pay

## 2021-09-20 ENCOUNTER — Encounter (HOSPITAL_COMMUNITY): Payer: Self-pay | Admitting: Physical Medicine and Rehabilitation

## 2021-09-20 LAB — COMPREHENSIVE METABOLIC PANEL
ALT: 80 U/L — ABNORMAL HIGH (ref 0–44)
AST: 53 U/L — ABNORMAL HIGH (ref 15–41)
Albumin: 3 g/dL — ABNORMAL LOW (ref 3.5–5.0)
Alkaline Phosphatase: 83 U/L (ref 38–126)
Anion gap: 11 (ref 5–15)
BUN: 11 mg/dL (ref 6–20)
CO2: 22 mmol/L (ref 22–32)
Calcium: 9.4 mg/dL (ref 8.9–10.3)
Chloride: 108 mmol/L (ref 98–111)
Creatinine, Ser: 0.73 mg/dL (ref 0.44–1.00)
GFR, Estimated: >60 mL/min (ref >60)
Glucose, Bld: 104 mg/dL — ABNORMAL HIGH (ref 70–99)
Potassium: 3.8 mmol/L (ref 3.5–5.1)
Sodium: 141 mmol/L (ref 135–145)
Total Bilirubin: 0.3 mg/dL (ref 0.3–1.2)
Total Protein: 6.8 g/dL (ref 6.5–8.1)

## 2021-09-20 LAB — CBC WITH DIFFERENTIAL/PLATELET
Abs Immature Granulocytes: 0.09 10*3/uL — ABNORMAL HIGH (ref 0.00–0.07)
Basophils Absolute: 0 10*3/uL (ref 0.0–0.1)
Basophils Relative: 0 %
Eosinophils Absolute: 0.1 10*3/uL (ref 0.0–0.5)
Eosinophils Relative: 2 %
HCT: 29.7 % — ABNORMAL LOW (ref 36.0–46.0)
Hemoglobin: 8.5 g/dL — ABNORMAL LOW (ref 12.0–15.0)
Immature Granulocytes: 1 %
Lymphocytes Relative: 31 %
Lymphs Abs: 2 10*3/uL (ref 0.7–4.0)
MCH: 21 pg — ABNORMAL LOW (ref 26.0–34.0)
MCHC: 28.6 g/dL — ABNORMAL LOW (ref 30.0–36.0)
MCV: 73.3 fL — ABNORMAL LOW (ref 80.0–100.0)
Monocytes Absolute: 0.5 10*3/uL (ref 0.1–1.0)
Monocytes Relative: 8 %
Neutro Abs: 3.8 10*3/uL (ref 1.7–7.7)
Neutrophils Relative %: 58 %
Platelets: 318 10*3/uL (ref 150–400)
RBC: 4.05 MIL/uL (ref 3.87–5.11)
RDW: 28.2 % — ABNORMAL HIGH (ref 11.5–15.5)
WBC: 6.6 10*3/uL (ref 4.0–10.5)
nRBC: 0.9 % — ABNORMAL HIGH (ref 0.0–0.2)

## 2021-09-20 LAB — VITAMIN D 25 HYDROXY (VIT D DEFICIENCY, FRACTURES): Vit D, 25-Hydroxy: 11.54 ng/mL — ABNORMAL LOW (ref 30–100)

## 2021-09-20 LAB — MAGNESIUM: Magnesium: 2 mg/dL (ref 1.7–2.4)

## 2021-09-20 MED ORDER — METOPROLOL TARTRATE 12.5 MG HALF TABLET
12.5000 mg | ORAL_TABLET | Freq: Two times a day (BID) | ORAL | Status: DC
  Filled 2021-09-20 (×2): qty 1

## 2021-09-20 MED ORDER — POTASSIUM CHLORIDE 20 MEQ PO PACK
20.0000 meq | PACK | Freq: Once | ORAL | Status: AC
  Administered 2021-09-20: 20 meq via ORAL
  Filled 2021-09-20: qty 1

## 2021-09-20 NOTE — Plan of Care (Signed)
?  Problem: RH Balance ?Goal: LTG Patient will maintain dynamic sitting balance (PT) ?Description: LTG:  Patient will maintain dynamic sitting balance with assistance during mobility activities (PT) ?Flowsheets (Taken 09/20/2021 1204) ?LTG: Pt will maintain dynamic sitting balance during mobility activities with:: Independent with assistive device  ?Goal: LTG Patient will maintain dynamic standing balance (PT) ?Description: LTG:  Patient will maintain dynamic standing balance with assistance during mobility activities (PT) ?Flowsheets (Taken 09/20/2021 1204) ?LTG: Pt will maintain dynamic standing balance during mobility activities with:: Supervision/Verbal cueing ?  ?Problem: Sit to Stand ?Goal: LTG:  Patient will perform sit to stand with assistance level (PT) ?Description: LTG:  Patient will perform sit to stand with assistance level (PT) ?Flowsheets (Taken 09/20/2021 1204) ?LTG: PT will perform sit to stand in preparation for functional mobility with assistance level: Supervision/Verbal cueing ?  ?Problem: RH Bed Mobility ?Goal: LTG Patient will perform bed mobility with assist (PT) ?Description: LTG: Patient will perform bed mobility with assistance, with/without cues (PT). ?Flowsheets (Taken 09/20/2021 1204) ?LTG: Pt will perform bed mobility with assistance level of: Independent ?  ?Problem: RH Bed to Chair Transfers ?Goal: LTG Patient will perform bed/chair transfers w/assist (PT) ?Description: LTG: Patient will perform bed to chair transfers with assistance (PT). ?Flowsheets (Taken 09/20/2021 1204) ?LTG: Pt will perform Bed to Chair Transfers with assistance level: Supervision/Verbal cueing ?  ?Problem: RH Car Transfers ?Goal: LTG Patient will perform car transfers with assist (PT) ?Description: LTG: Patient will perform car transfers with assistance (PT). ?Flowsheets (Taken 09/20/2021 1204) ?LTG: Pt will perform car transfers with assist:: Supervision/Verbal cueing ?  ?Problem: RH Ambulation ?Goal: LTG Patient will  ambulate in controlled environment (PT) ?Description: LTG: Patient will ambulate in a controlled environment, # of feet with assistance (PT). ?Flowsheets (Taken 09/20/2021 1204) ?LTG: Pt will ambulate in controlled environ  assist needed:: Supervision/Verbal cueing ?LTG: Ambulation distance in controlled environment: 150 ?Goal: LTG Patient will ambulate in home environment (PT) ?Description: LTG: Patient will ambulate in home environment, # of feet with assistance (PT). ?Flowsheets (Taken 09/20/2021 1204) ?LTG: Pt will ambulate in home environ  assist needed:: Supervision/Verbal cueing ?LTG: Ambulation distance in home environment: 50 ?  ?Problem: RH Stairs ?Goal: LTG Patient will ambulate up and down stairs w/assist (PT) ?Description: LTG: Patient will ambulate up and down # of stairs with assistance (PT) ?Flowsheets (Taken 09/20/2021 1204) ?LTG: Pt will ambulate up/down stairs assist needed:: Supervision/Verbal cueing ?LTG: Pt will  ambulate up and down number of stairs: 3 ?Note: HR per home set up ?  ?

## 2021-09-20 NOTE — Evaluation (Signed)
Speech Language Pathology Assessment and Plan ? ?Patient Details  ?Name: April Reilly ?MRN: [id number] ?Date of Birth: 12-03-1970 ? ?SLP Diagnosis: Cognitive Impairments  ?Rehab Potential: Good ?ELOS: 2 weeks  ? ?Today's Date: 09/20/2021 ?SLP Individual Time: 1300-1400 ?SLP Individual Time Calculation (min): 60 min ? ?Hospital Problem: Principal Problem: ?  Cerebral venous sinus thrombosis ? ?Past Medical History:  ?Past Medical History:  ?Diagnosis Date  ? Asthma   ? Vaginal bleeding, abnormal   ? for 4 months  ? ?Past Surgical History:  ?Past Surgical History:  ?Procedure Laterality Date  ? FOOT SURGERY    ? TUBAL LIGATION    ? ? ?Assessment / Plan / Recommendation ?Clinical Impression April Reilly is a 51 year old female with history of SS trait, uterine fibroids, abnormal vaginal bleeding X 4 months otherwise in relatively good health who was admitted on 09/10/21 with 3 day history of malaise due to flu like symptoms with fall night prior to admission and progressive somnolence. She was started on broad spectrum antibiotics for fever and leucocytosis due to RLL aspiration PNA. 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 She was started on hypertonic saline and EEG done showing evidence of epileptogenicity with high potential for seizures. CTA head/heck showed multifocal narrowing in R>L A1, M1 and proximal R-M2 segments likely due to vasospasm and hypertensity and IPH in right cerebral hemispheres concerning for infarct with hemorrhagic conversion. CT venogram done showing subocclusive clot in distal right transverse sinus and right sigmoid dural venous sinus. ?  ?Patient demonstrates cognitive impairments characterized by decreased sustained attention, functional problem solving, recall of functional information, and intellectual awareness which impacts her safety with functional  and familiar tasks. Patient was unable to identify physical or cognitive changes s/p CVA and felt that she would be ready to discharge by tomorrow and immediately get back to work. SLP administered the [facility] Mental Status Examination (SLUMS) and patient scored 15/30 points with a score of 27 or above considered normal. Patient's overall auditory comprehension and verbal expression appeared [facility] for all tasks assessed and patient was 100% intelligible at the conversation level. Patient would benefit from skilled SLP intervention to maximize her cognitive functioning and overall functional independence prior to discharge.  ?  ?Skilled Therapeutic Interventions          Pt participated in [facility] Status Examination (SLUMS) as well as further non-standardized assessments of cognitive-linguistic, speech, and language function. Please see above.     ?SLP Assessment ? Patient will need skilled Speech Lanaguage Pathology Services during CIR admission  ?  ?Recommendations ? Medication Administration: Whole meds with liquid ?Oral Care Recommendations: Oral care BID ?Patient destination: Home ?Follow up Recommendations: 24 hour supervision/assistance;Outpatient SLP ?Equipment Recommended: None recommended by SLP  ?  ?SLP Frequency 3 to 5 out of 7 days   ?SLP Duration ? ?SLP Intensity ? ?SLP Treatment/Interventions 2 weeks ? ?Minumum of 1-2 x/day, 30 to 90 minutes ? ?Cognitive remediation/compensation;Internal/external aids;Cueing hierarchy;Functional tasks;Patient/family education;Therapeutic Activities   ? ?Pain ?Pain Assessment ?Pain Scale: 0-10 ?Pain Score: 0-No pain ? ?Prior Functioning ?Cognitive/Linguistic Baseline: Within functional limits ?Type of Home: House ? Lives With: Alone ?Available Help at Discharge: Family;Available 24 hours/day ?Vocation: Full time employment ? ?SLP Evaluation ?Cognition ?Overall Cognitive Status: Impaired/Different from baseline ?Arousal/Alertness:  Awake/alert ?Orientation Level: Oriented X4 ?Year: 2023 ?Month: March ?Day of  Week: Incorrect ?Attention: Sustained ?Focused Attention: Appears intact ?Sustained Attention: Impaired ?Sustained Attention Impairment: Functional basic ?Selective Attention: Impaired ?Selective Attention Impairment: Verbal basic;Functional basic ?Memory: Impaired ?Memory Impairment: Storage deficit;Retrieval deficit ?Awareness Impairment: Intellectual impairment ?Problem Solving: Impaired ?Problem Solving Impairment: Verbal basic;Functional basic ?Executive Function: Self Monitoring;Self Correcting ?Self Monitoring: Impaired ?Self Monitoring Impairment: Verbal basic;Functional basic ?Self Correcting: Impaired ?Self Correcting Impairment: Verbal basic;Functional basic ?Safety/Judgment: Impaired  ?Comprehension ?Auditory Comprehension ?Overall Auditory Comprehension: Appears within functional limits for tasks assessed ?Expression ?Expression ?Primary Mode of Expression: Verbal ?Verbal Expression ?Overall Verbal Expression: Appears within functional limits for tasks assessed ?Oral Motor ?Oral Motor/Sensory Function ?Overall Oral Motor/Sensory Function: Mild impairment ?Facial ROM: Reduced left;Suspected CN VII (facial) dysfunction ?Facial Symmetry: Abnormal symmetry left;Suspected CN VII (facial) dysfunction ?Facial Strength: Reduced left;Suspected CN VII (facial) dysfunction ?Facial Sensation: Within Functional Limits ?Lingual ROM: Within Functional Limits ?Lingual Symmetry: Within Functional Limits ?Lingual Strength: Reduced ?Lingual Sensation: Within Functional Limits ?Velum: Within Functional Limits ?Mandible: Within Functional Limits ?Motor Speech ?Overall Motor Speech: Appears within functional limits for tasks assessed ?Respiration: Within functional limits ?Articulation: Within functional limitis ? ?Care Tool ?Care Tool Cognition ?Ability to hear (with hearing aid or hearing appliances if normally used Ability to hear (with hearing  aid or hearing appliances if normally used): 0. Adequate - no difficulty in normal conservation, social interaction, listening to TV ?  ?Expression of Ideas and Wants Expression of Ideas and Wants: 4. Without difficulty (complex and basic) - expresses complex messages without difficulty and with speech that is clear and easy to understand ?  ?Understanding Verbal and Non-Verbal Content Understanding Verbal and Non-Verbal Content: 3. Usually understands - understands most conversations, but misses some part/intent of message. Requires cues at times to understand  ?Memory/Recall Ability Memory/Recall Ability : Current season;Location of own room;That he or she is in a hospital/hospital unit  ? ?Short Term Goals: ?Week 1: SLP Short Term Goal 1 (Week 1): Patient will demonstrate selective attention by completing in functional tasks in a mildly distracting environment with min A verbal redirection ?SLP Short Term Goal 2 (Week 1): Patient will demonstrate mildly complex problem solving with min-to-mod A verbal/visual cues ?SLP Short Term Goal 3 (Week 1): Patient will demonstrate awareness and repair errors during functional and novel tasks with min-to-mod A verbal/visual cues ?SLP Short Term Goal 4 (Week 1): Patient will verbalize at least 2 safety precautions associated with new limitation(s) with min-to-mod A cues ?SLP Short Term Goal 5 (Week 1): Patient will utilize compensatory memory strategies to recall novel information with min-to-mod A verbal/visual cues ? ?Refer to Care Plan for Long Term Goals ? ?Recommendations for other services: None  and Therapeutic Recreation  Kitchen group ? ?Discharge Criteria: Patient will be discharged from SLP if patient refuses treatment 3 consecutive times without medical reason, if treatment goals not met, if there is a change in medical status, if patient makes no progress towards goals or if patient is discharged from hospital. ? ?The above assessment, treatment plan, treatment  alternatives and goals were discussed and mutually agreed upon: by patient ? ? ? ? ? ?Altin Sease T Yaneli Keithley ?09/20/2021, 3:41 PM ? ? ?

## 2021-09-20 NOTE — Plan of Care (Signed)
?  Problem: RH Problem Solving ?Goal: LTG Patient will demonstrate problem solving for (SLP) ?Description: LTG:  Patient will demonstrate problem solving for basic/complex daily situations with cues  (SLP) ?Flowsheets (Taken 09/20/2021 1547) ?LTG Patient will demonstrate problem solving for: ? Supervision ? Minimal Assistance - Patient > 75% ?  ?Problem: RH Memory ?Goal: LTG Patient will use memory compensatory aids to (SLP) ?Description: LTG:  Patient will use memory compensatory aids to recall biographical/new, daily complex information with cues (SLP) ?Flowsheets (Taken 09/20/2021 1547) ?LTG: Patient will use memory compensatory aids to (SLP): ? Supervision ? Minimal Assistance - Patient > 75% ?  ?Problem: RH Attention ?Goal: LTG Patient will demonstrate this level of attention during functional activites (SLP) ?Description: LTG:  Patient will will demonstrate this level of attention during functional activites (SLP) ?Flowsheets (Taken 09/20/2021 1547) ?Patient will demonstrate during cognitive/linguistic activities the attention type of: Selective ?Patient will demonstrate this level of attention during cognitive/linguistic activities in: Controlled ?LTG: Patient will demonstrate this level of attention during cognitive/linguistic activities with assistance of (SLP): Supervision ?  ?Problem: RH Awareness ?Goal: LTG: Patient will demonstrate awareness during functional activites type of (SLP) ?Description: LTG: Patient will demonstrate awareness during functional activites type of (SLP) ?Flowsheets (Taken 09/20/2021 1547) ?Patient will demonstrate during cognitive/linguistic activities awareness type of: ? Intellectual ? Emergent ?LTG: Patient will demonstrate awareness during cognitive/linguistic activities with assistance of (SLP): Minimal Assistance - Patient > 75% ?  ?

## 2021-09-20 NOTE — Progress Notes (Addendum)
?                                                       PROGRESS NOTE ? ? ?Subjective/Complaints: ?Hypotensive ?Sleepy ?Improvement in hgb ?Suboptimal potassium ? ?Objective: ?  ?No results found. ?Recent Labs  ?  09/18/21 ?0453 09/20/21 ?1950  ?WBC 8.2 6.6  ?HGB 7.7* 8.5*  ?HCT 26.0* 29.7*  ?PLT 286 318  ? ?Recent Labs  ?  09/20/21 ?9326  ?NA 141  ?K 3.8  ?CL 108  ?CO2 22  ?GLUCOSE 104*  ?BUN 11  ?CREATININE 0.73  ?CALCIUM 9.4  ? ?No intake or output data in the 24 hours ending 09/20/21 1214  ? ?  ? ?Physical Exam: ?Vital Signs ?Blood pressure (!) 94/58, pulse 87, temperature 99.4 ?F (37.4 ?C), temperature source Oral, resp. rate 18, SpO2 99 %. ?Gen: no distress, normal appearing ?HEENT: oral mucosa pink and moist, NCAT ?Cardio: Reg rate ?Chest: normal effort, normal rate of breathing ?Abd: soft, non-distended ?Ext: no edema ?Psych: pleasant, normal affect ?Skin: intact ?Neurological:  ?   Mental Status: She is alert and oriented to person, place, and time.  ?   Comments: Speech clear but distracted by pain. She was able to follow simple motor commands. Had difficulty recalling medical history. LUE weakness noted.   ?  ? ? ?Assessment/Plan: ?1. Functional deficits which require 3+ hours per day of interdisciplinary therapy in a comprehensive inpatient rehab setting. ?Physiatrist is providing close team supervision and 24 hour management of active medical problems listed below. ?Physiatrist and rehab team continue to assess barriers to discharge/monitor patient progress toward functional and medical goals ? ?Care Tool: ? ?Bathing ?   ?Body parts bathed by patient: Left arm, Chest, Abdomen, Right arm, Front perineal area, Buttocks, Right upper leg, Left upper leg, Face, Right lower leg, Left lower leg  ?   ?  ?  ?Bathing assist Assist Level: Supervision/Verbal cueing ?  ?  ?Upper Body Dressing/Undressing ?Upper body dressing   ?What is the patient wearing?: Pull over shirt ?   ?Upper body assist Assist Level:  Supervision/Verbal cueing ?   ?Lower Body Dressing/Undressing ?Lower body dressing ? ? ?   ?What is the patient wearing?: Underwear/pull up, Pants ? ?  ? ?Lower body assist Assist for lower body dressing: Supervision/Verbal cueing ?   ? ?Toileting ?Toileting    ?Toileting assist Assist for toileting: Supervision/Verbal cueing ?  ?  ?Transfers ?Chair/bed transfer ? ?Transfers assist ?   ? ?Chair/bed transfer assist level: Contact Guard/Touching assist ?  ?  ?Locomotion ?Ambulation ? ? ?Ambulation assist ? ?   ? ?Assist level: Minimal Assistance - Patient > 75% ?Assistive device: No Device ?Max distance: 250  ? ?Walk 10 feet activity ? ? ?Assist ?   ? ?Assist level: Minimal Assistance - Patient > 75% ?Assistive device: No Device  ? ?Walk 50 feet activity ? ? ?Assist   ? ?Assist level: Minimal Assistance - Patient > 75% ?Assistive device: No Device  ? ? ?Walk 150 feet activity ? ? ?Assist   ? ?Assist level: Minimal Assistance - Patient > 75% ?Assistive device: No Device ?  ? ?Walk 10 feet on uneven surface  ?activity ? ? ?Assist Walk 10 feet on uneven surfaces activity did not occur: Safety/medical concerns ? ? ?  ?   ? ?  Wheelchair ? ? ? ? ?Assist Is the patient using a wheelchair?: No ?  ?  ? ?  ?   ? ? ?Wheelchair 50 feet with 2 turns activity ? ? ? ?Assist ? ?  ?  ? ? ?   ? ?Wheelchair 150 feet activity  ? ? ? ?Assist ?   ? ? ?   ? ?Blood pressure (!) 94/58, pulse 87, temperature 99.4 ?F (37.4 ?C), temperature source Oral, resp. rate 18, SpO2 99 %. ? ?Medical Problem List and Plan: ?1. 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            -patient may shower ?            -ELOS/Goals: 10-14 days S ?            -Initial CIR evaluations today ?2.  Antithrombotics: ?-DVT/anticoagulation:  Pharmaceutical: Other (comment)--Eliquis ?            -antiplatelet therapy: N/A ?3. Headaches: Continue Fioricet (has been using TID on average). Check magnesium level tomorrow.  ?--May need Topamax as  continues to report significant headaches.  ?4. Mood:  LCSW to follow for evaluation and support.  ?            -antipsychotic agents: N/a ?5. Neuropsych: This patient is not fully  capable of making decisions on his own behalf. ?6. Skin/Wound Care: Routine pressure relief measures.  ?7. Fluids/Electrolytes/Nutrition: Monitor I/O. Check CMET in am. ?8. SS trait/ABLA: Menses X 4 months due to bleeding fibroids. Continue to monitor H/H as now on Ac.  ?9. Concern for Seizure: Continue Keppra 500 mg bid.  ?10. Tachycardia: Continue metoprolol bid.  ?            --BP soft in high 90-low 100 range without symptoms.  ?11. Hyperlipidemia: Continue Lipitor.  ?12. Suboptimal potassium:supplement with 20 meq klor ?13.Hypotension: decrease lopressor to 12.5mg BID ?14.Overweight: provide dietary education ? ?LOS: ?1 days ?A FACE TO FACE EVALUATION WAS PERFORMED ? ?Martha Clan P Denya Buckingham ?09/20/2021, 12:14 PM  ? ?  ?

## 2021-09-20 NOTE — Progress Notes (Signed)
Inpatient Rehabilitation Care Coordinator ?Assessment and Plan ?Patient Details  ?Name: April Reilly ?MRN: [id number] ?Date of Birth: 1971-01-21 ? ?Today's Date: 09/20/2021 ? ?Hospital Problems: Principal Problem: ?  Cerebral venous sinus thrombosis ? ?Past Medical History:  ?Past Medical History:  ?Diagnosis Date  ? Asthma   ? Vaginal bleeding, abnormal   ? for 4 months  ? ?Past Surgical History:  ?Past Surgical History:  ?Procedure Laterality Date  ? FOOT SURGERY    ? TUBAL LIGATION    ? ?Social History:  reports that she has never smoked. She has never used smokeless tobacco. She reports current alcohol use. She reports that she does not use drugs. ? ?Family / Support Systems ?Marital Status: Single ?Patient Roles: Parent, Other (Comment) (sibling and employee) ?Children: [name] [phone] daughter ?Other Supports: [name]-sister [phone] ?Anticipated Caregiver: Four daughter;s all live in [place] as does pt-sister to assist also ?Ability/Limitations of Caregiver: Daughter's work but will rotate to provide 24/7 care to pt ?Caregiver Availability: 24/7 ?Family Dynamics: Close knit with family who are very close to one another along with sister. All live around one another and will work together on her discharge needs. ? ?Social History ?Preferred language: English ?Religion: None ?Cultural Background: No issues ?Education: [employer] ?Health Literacy - How often do you need to have someone help you when you read instructions, pamphlets, or other written material from your doctor or pharmacy?: Never ?Writes: Yes ?Employment Status: Employed ?Name of Employer: Head Chef at [employer] ?Return to Work Plans: Depends upon her progress  after CVA ?Legal History/Current Legal Issues: No issues ?Guardian/Conservator: None-according to MD pt is not fully capable of making her own decisions while here. Will look toward her daughger's since no formal POA/HCPOA in place. Oldest daughter spokes person-[place]   ? ?Abuse/Neglect ?Abuse/Neglect Assessment Can Be Completed: Yes ?Physical Abuse: Denies ?Verbal Abuse: Denies ?Sexual Abuse: Denies ?Exploitation of patient/patient's resources: Denies ?Self-Neglect: Denies ? ?Patient response to: ?Social Isolation - How often do you feel lonely or isolated from those around you?: Never ? 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 She does try though. She has always been independent and taken care of herself and her children while they were growing up. She wants to get back to this level if she can and will work hard to do this while here ?Recent Psychosocial Issues: other health issues-GYN dealing with prior to admission ?Psychiatric History: No history feel would benefit from seeing neuro-psych while here due to young age for coping. Will wait until it is appropriate to see ?Substance Abuse History: No issues ? ?Patient / Family Perceptions, Expectations & Goals ?Pt/Family understanding of illness & functional limitations: Pt and daugther can explain her stroke and deficits. Both do talk with the rounding MD and feel they have a good understanding of her treatment plan moving forward. Both are hopeful she will do well here and have high expectations ?Premorbid pt/family roles/activities: mom, sibling, employee, friend, co-worker, etc ?Anticipated changes in roles/activities/participation: resume ?Pt/family expectations/goals: Pt states: " I want to do well here."  Daughter states: " We are hopeful and will do what we need to do for our Mom, she would [place]." ? ?Community Resources ?Community Agencies: None ?Premorbid Home Care/DME Agencies: None ?Transportation available at discharge: Pt drove and daughter's can now ?Is the patient able to respond to transportation needs?: Yes ?In the past 12 months, has lack of transportation kept you from medical appointments or from  getting medications?: No ?In the past  12 months, has lack of transportation kept you from meetings, work, or from getting things needed for daily living?: No ?Resource referrals recommended: Neuropsychology ? ?Discharge Planning ?Living Arrangements: Alone ?Support Systems: Children, Other relatives, Friends/neighbors ?Type of Residence: Private residence ?Insurance Resources: Multimedia programmer (specify) [facility]) ?Financial Resources: Employment ?Financial Screen Referred: Yes ?Living Expenses: Rent ?Money Management: Patient ?Does the patient have any problems obtaining your medications?: No ?Home Management: Self now family will assist ?Patient/Family Preliminary Plans: Return home with daughter's and sister rotating to assist with her care. Aware she will need 24/7 care at discharge from rehab. Aware team evaluating her today and setting goals. Aware of team conference on wed will update then. ?Care Coordinator Barriers to Discharge: Insurance for SNF coverage ?Care Coordinator Anticipated Follow Up Needs: HH/OP ? ?Clinical Impression ?Pleasant female who is willing to work hard and recover from her stroke. She has good family support via daughter's and sister. Her daughter's live in locally. Would benefit from seeing neuro-psych while here for coping. Will place on list when appropriate. Will work on discharge needs. ? ?[name] ?09/20/2021, 9:43 AM ? ?  ?

## 2021-09-20 NOTE — Progress Notes (Signed)
Inpatient Rehabilitation Center ?Individual Statement of Services ? ?Patient Name:  April Reilly  ?Date:  09/20/2021 ? ?Welcome to the [facility].  Our goal is to provide you with an individualized program based on your diagnosis and situation, designed to meet your specific needs.  With this comprehensive rehabilitation program, you will be expected to participate in at least 3 hours of rehabilitation therapies Monday-Friday, with modified therapy programming on the weekends. ? ?Your rehabilitation program will include the following services:  Physical Therapy (PT), Occupational Therapy (OT), Speech Therapy (ST), 24 hour per day rehabilitation nursing, Therapeutic Recreaction (TR), Neuropsychology, Care Coordinator, Rehabilitation Medicine, Nutrition Services, and Pharmacy Services ? ?Weekly team conferences will be held on Wednesday to discuss your progress.  Your Inpatient Rehabilitation Care Coordinator will talk with you frequently to get your input and to update you on team discussions.  Team conferences with you and your family in attendance may also be held. ? ?Expected length of stay: 14 days  Overall anticipated outcome: overall supervision level ? ?Depending on your progress and recovery, your program may change. Your Inpatient Rehabilitation Care Coordinator will coordinate services and will keep you informed of any changes. Your Inpatient Rehabilitation Care Coordinator's name and contact numbers are listed  below. ? ?The following services may also be recommended but are not provided by the [facility]:  ?Driving Evaluations ?Home Health Rehabiltiation Services ?Outpatient Rehabilitation Services ?Vocational Rehabilitation ?  ?Arrangements will be made to provide these services after discharge if needed.  Arrangements include referral to agencies that provide these services. ? ?Your insurance has been verified to be:  [employer] ?Your primary doctor is:   ? ?Pertinent  information will be shared with your doctor and your insurance company. ? ?Inpatient Rehabilitation Care Coordinator:  Ovidio Kin, [place] or (C) [phone] ? ?Information discussed with and copy given to patient by: [name], 09/20/2021, 9:45 AM    ?

## 2021-09-20 NOTE — Evaluation (Signed)
Physical Therapy Assessment and Plan  Patient Details  Name: April Reilly MRN: [id number] Date of Birth: 1971-06-05  PT Diagnosis: Abnormal posture, Abnormality of gait, Cognitive deficits, Hemiparesis non-dominant, and Muscle weakness Rehab Potential: Good ELOS: 2 weeks   Today's Date: 09/20/2021 PT Individual Time: 1100-1155 PT Individual Time Calculation (min): 55 min    Hospital Problem: Principal Problem:   Cerebral venous sinus thrombosis   Past Medical History:  Past Medical History:  Diagnosis Date   Asthma    Vaginal bleeding, abnormal    for 4 months   Past Surgical History:  Past Surgical History:  Procedure Laterality Date   FOOT SURGERY     TUBAL LIGATION      Assessment & Plan Clinical Impression: Patient is a 51 y.o. year old female with history of SS trait, uterine fibroids, abnormal vaginal bleeding X 4 months otherwise in relatively good health who was admitted on 09/10/21 with 3 day history of malaise due to flu like symptoms with fall night prior to admission and progressive somnolence. Patient noted to be unresponsive with unequal pupils and dysconjugate gaze. She was started on broad spectrum antibiotics for fever and leucocytosis due to RLL aspiration PNA.  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  She was started on hypertonic saline and EEG done showing evidence of epileptogenicity with  high potential for seizures.     CTA head/heck showed multifocal narrowing in R>L A1, M1 and proximal R-M2 segments likely due to vasospasm and hypertensity and IPH in right cerebral hemispheres concerning for infarct with hemorrhagic conversion. CT venogram done showing subocclusive clot in distal right transverse sinus and right sigmoid dural venous sinus. She was started on IV heparin and neurosurgery contacted for input as MRI brain  showed concerns of uncal herniation.  Conservative care recommended with serial CT head which showed increase in midline shift to 11 mm. She was loaded with dilantin and with LT_EEG showing evidence of epileptogenicity and cortical dysfunction with moderate diffuse encephalopathy.Keppra added 02/26 and dilatin discontinued.  2D echo done revealing EF 60-65% with no wall abnormality. Mentation has improved and megestrol resumed on 02/04 for ongoing bleeding due to uterine fibroids.    Hypertonic saline weaned off with improvement in bouts of lethargy and electrolyte abnormalities resolved. Swallow function improved and she was advanced to regular diet. Repeat CTA/V done on 02/27 showing evolving hemorrhagic infarct with mild improvement in mass effect, no hydrocephalus and persistent but improved nonocclusive thrombus in right distal transverse and sigmoid sinus. Heparin transitioned to [place] on 02/28 and Neurology recommends 3-6 months of AC as well as close follow up with neurology. She has had issues with headaches managed with Fioricet and low dose  tramadol prn. Blood pressures have been on low side and she continues on metoprolol for management of tachycardia. She continues to have cognitive deficits with disorientation, left sided weakness with balance deficits and left inattention affecting ADLs and mobility. CIR recommended due to functional decline. Currently complains of weakness.   Patient currently requires min with mobility secondary to muscle weakness, decreased cardiorespiratoy endurance, decreased attention to left and decreased motor planning, decreased initiation, decreased attention, decreased awareness, decreased problem solving, decreased safety awareness, decreased memory, and delayed processing, and decreased sitting balance, decreased standing balance, decreased postural control, hemiplegia, and decreased balance strategies.  Prior to hospitalization, patient was independent  with mobility  and lived with Alone in a House home.  Home access is 3Stairs to enter.  Patient will benefit from skilled PT intervention to maximize safe functional mobility, minimize fall risk, and decrease caregiver burden for planned discharge home with 24 hour supervision.  Anticipate patient will benefit from follow up OP at discharge.  PT - End of Session Activity Tolerance: Tolerates 30+ min activity with multiple rests Endurance Deficit: Yes Endurance Deficit Description: sometimes limited by headaches PT Assessment Rehab Potential (ACUTE/IP ONLY): Good PT Barriers to Discharge: Decreased caregiver support;Lack of/limited family support;Behavior PT Patient demonstrates impairments in the following area(s): Balance;Behavior;Endurance;Motor;Perception;Safety;Sensory;Skin Integrity PT Transfers Functional Problem(s): Bed Mobility;Bed to Chair;Car;Furniture PT Locomotion Functional Problem(s): Ambulation;Stairs PT Plan PT Intensity: Minimum of 1-2 x/day ,45 to 90 minutes PT Frequency: 5 out of 7 days PT Duration Estimated Length of Stay: 2 weeks PT Treatment/Interventions: Ambulation/gait training;Discharge planning;Functional mobility training;Psychosocial support;Therapeutic Activities;Visual/perceptual remediation/compensation;Balance/vestibular training;Disease management/prevention;Neuromuscular re-education;Skin care/wound management;Therapeutic Exercise;Wheelchair propulsion/positioning;Cognitive remediation/compensation;DME/adaptive equipment instruction;Pain management;Splinting/orthotics;UE/LE Strength taining/ROM;Community reintegration;Functional electrical stimulation;Patient/family education;Stair training;UE/LE Coordination activities PT Transfers Anticipated Outcome(s): spv PT Locomotion Anticipated Outcome(s): spv PT Recommendation Recommendations for Other Services: Therapeutic Recreation consult Therapeutic Recreation Interventions: Pet therapy Follow Up Recommendations: Outpatient  PT;24 hour supervision/assistance Patient destination: Home Equipment Recommended: To be determined   PT Evaluation Precautions/Restrictions Precautions Precautions: Fall Restrictions Weight Bearing Restrictions: No  Pain Pain Assessment Pain Scale: 0-10 Pain Score: 3  Pain Location: Head Pain Interference Pain Interference Pain Effect on Sleep: 1. Rarely or not at all Pain Interference with Therapy Activities: 1. Rarely or not at all Pain Interference with Day-to-Day Activities: 1. Rarely or not at all Home Living/Prior [place] expects to be discharged to:: Private residence Living Arrangements: Alone Available Help at Discharge: Family;Available 24 hours/day Type of Home: House Home Access: Stairs to enter [employer] of Steps: 3 Entrance Stairs-Rails: None Home Layout: One level Bathroom Shower/Tub: Chiropodist: Standard Bathroom Accessibility: No  Lives With: Alone Prior Function Level of Independence: Independent with basic ADLs;Independent with homemaking with ambulation;Independent with gait;Independent with transfers  Able to Take Stairs?: Yes Driving: Yes Vocation: Full time employment Vocation Requirements: Veterinary surgeon at [employer]: Hobbies-yes (Comment) (loves to cook) Vision/Perception  Vision - History Ability to See in Adequate Light: 0 Adequate Vision - Assessment Eye Alignment: Within Functional Limits Ocular Range of Motion: Within Functional Limits Alignment/Gaze Preference: Within Defined Limits Tracking/Visual Pursuits: Decreased smoothness of eye movement to LEFT inferior field;Decreased smoothness of eye movement to LEFT superior field Saccades: Decreased speed of saccadic movement (decreased speed to left) Perception Perception: Impaired Inattention/Neglect: Does not attend to left visual field;Does not attend to left side of body Praxis Praxis: Intact   Cognition Overall Cognitive Status: Impaired/Different from baseline Arousal/Alertness: Awake/alert Year: 2023 Month: March Day of Week: Incorrect Attention: Sustained Sustained Attention: Impaired Sustained Attention Impairment: Functional basic Memory: Impaired Memory Impairment: Storage deficit;Retrieval deficit Immediate Memory Recall: Sock;Blue;Bed Memory Recall Sock: With Cue Memory Recall Blue: Without Cue Memory Recall Bed: Without Cue Awareness: Impaired Awareness Impairment: Intellectual impairment (decreased awareness of deficits) Problem Solving: Impaired Safety/Judgment: Appears intact Sensation Sensation Light Touch: Appears Intact Hot/Cold: Appears Intact Proprioception: Appears Intact Stereognosis: Appears Intact Coordination Gross Motor Movements are Fluid and Coordinated: No Fine Motor Movements are Fluid and Coordinated: No Coordination and Movement Description: decreased due to L hemiparesis Finger Nose Finger Test: 5x on L, 10x on R in 10 seconds. accurate but slow due to decreased L sh strength Heel Shin Test: limited  AROM L LE Motor  Motor Motor: Hemiplegia Motor - Skilled Clinical Observations: mild hemiparesis   Trunk/Postural Assessment  Cervical Assessment Cervical Assessment: Within Functional Limits Thoracic Assessment Thoracic Assessment: Within Functional Limits Lumbar Assessment Lumbar Assessment: Within Functional Limits Postural Control Postural Control: Deficits on evaluation Trunk Control: slight limitations to left due to hemiparesis  Balance Balance Balance Assessed: Yes Standardized Balance Assessment Standardized Balance Assessment: Berg Balance Test Berg Balance Test Sit to Stand: Able to stand without using hands and stabilize independently Standing Unsupported: Able to stand 2 minutes with supervision Sitting with Back Unsupported but Feet Supported on Floor or Stool: Able to sit 2 minutes under supervision Stand to  Sit: Controls descent by using hands Transfers: Needs one person to assist Standing Unsupported with Eyes Closed: Able to stand 10 seconds with supervision Standing Ubsupported with Feet Together: Needs help to attain position but able to stand for 30 seconds with feet together From Standing, Reach Forward with Outstretched Arm: Reaches forward but needs supervision From Standing Position, Pick up Object from Floor: Able to pick up shoe, needs supervision From Standing Position, Turn to Look Behind Over each Shoulder: Needs supervision when turning Turn 360 Degrees: Needs close supervision or verbal cueing Standing Unsupported, Alternately Place Feet on Step/Stool: Able to complete >2 steps/needs minimal assist Standing Unsupported, One Foot in Front: Needs help to step but can hold 15 seconds Standing on One Leg: Tries to lift leg/unable to hold 3 seconds but remains standing independently Total Score: 27 Static Sitting Balance Static Sitting - Balance Support: Feet supported Static Sitting - Level of Assistance: 5: Stand by assistance Dynamic Sitting Balance Dynamic Sitting - Balance Support: Feet supported Dynamic Sitting - Level of Assistance: 5: Stand by assistance Static Standing Balance Static Standing - Balance Support: No upper extremity supported Static Standing - Level of Assistance: 5: Stand by assistance Dynamic Standing Balance Dynamic Standing - Balance Support: During functional activity;No upper extremity supported Dynamic Standing - Level of Assistance: 4: Min assist Dynamic Standing - Balance Activities: Lateral lean/weight shifting;Forward lean/weight shifting;Other (comment) (standing to doff clothing over feet) Extremity Assessment  RUE Assessment RUE Assessment: Within Functional Limits LUE Assessment Passive Range of Motion (PROM) Comments: WFL Active Range of Motion (AROM) Comments: sh flexion and abd 90 degrees General Strength Comments: 3-/5 shoulder, 4-/5  elbow grasp RLE Assessment RLE Assessment: Within Functional Limits LLE Assessment LLE Assessment: Exceptions to [facility] LLE Strength Left Hip Flexion: 3-/5 Left Hip ABduction: 3+/5 Left Hip ADduction: 3+/5 Left Knee Flexion: 3+/5 Left Knee Extension: 3+/5 Left Ankle Dorsiflexion: 4-/5 Left Ankle Plantar Flexion: 4-/5  Care Tool Care Tool Bed Mobility Roll left and right activity   Roll left and right assist level: Independent    Sit to lying activity   Sit to lying assist level: Independent    Lying to sitting on side of bed activity   Lying to sitting on side of bed assist level: the ability to move from lying on the back to sitting on the side of the bed with no back support.: Independent     Care Tool Transfers Sit to stand transfer   Sit to stand assist level: Supervision/Verbal cueing    Chair/bed transfer   Chair/bed transfer assist level: Contact Guard/Touching assist     Toilet transfer   Assist Level: Contact Guard/Touching assist    Car transfer   Car transfer assist level: Contact Guard/Touching assist      Care Tool Locomotion Ambulation   Assist level:  Minimal Assistance - Patient > 75% Assistive device: No Device Max distance: 250  Walk 10 feet activity   Assist level: Minimal Assistance - Patient > 75% Assistive device: No Device   Walk 50 feet with 2 turns activity   Assist level: Minimal Assistance - Patient > 75% Assistive device: No Device  Walk 150 feet activity   Assist level: Minimal Assistance - Patient > 75% Assistive device: No Device  Walk 10 feet on uneven surfaces activity Walk 10 feet on uneven surfaces activity did not occur: Safety/medical concerns      Stairs   Assist level: Minimal Assistance - Patient > 75% Stairs assistive device: 2 hand rails Max number of stairs: 12  Walk up/down 1 step activity   Walk up/down 1 step (curb) assist level: Minimal Assistance - Patient > 75% Walk up/down 1 step or curb assistive device: 2  hand rails  Walk up/down 4 steps activity   Walk up/down 4 steps assist level: Minimal Assistance - Patient > 75% Walk up/down 4 steps assistive device: 2 hand rails  Walk up/down 12 steps activity   Walk up/down 12 steps assist level: Minimal Assistance - Patient > 75% Walk up/down 12 steps assistive device: 2 hand rails  Pick up small objects from floor   Pick up small object from the floor assist level: Contact Guard/Touching assist    Wheelchair Is the patient using a wheelchair?: No          Wheel 50 feet with 2 turns activity      Wheel 150 feet activity        Refer to Care Plan for Long Term Goals  SHORT TERM GOAL WEEK 1 PT Short Term Goal 1 (Week 1): Patient will improve Berg score by >/= 7 points PT Short Term Goal 2 (Week 1): Patient will participate in FGA PT Short Term Goal 3 (Week 1): Patient will ambulate >162f with no more than CGA and LRAD  Recommendations for other services: Therapeutic Recreation  Pet therapy  Skilled Therapeutic Intervention Mobility Bed Mobility Bed Mobility: Rolling Right;Rolling Left;Supine to Sit;Sit to Supine Rolling Right: Independent Rolling Left: Independent Supine to Sit: Supervision/Verbal cueing Sit to Supine: Supervision/Verbal cueing Transfers Transfers: Stand to Sit;Sit to Stand;Stand Pivot Transfers Sit to Stand: Supervision/Verbal cueing Stand to Sit: Supervision/Verbal cueing Stand Pivot Transfers: Contact Guard/Touching assist Transfer (Assistive device): 1 person hand held assist Locomotion  Gait Ambulation: Yes Gait Assistance: Contact Guard/Touching assist;Minimal Assistance - Patient > 75% Gait Distance (Feet): 250 Feet Assistive device: 1 person hand held assist Gait Assistance Details: Verbal cues for gait pattern;Verbal cues for precautions/safety Gait Gait: Yes Gait Pattern: Impaired Gait Pattern: Step-through pattern;Trunk flexed;Narrow base of support (postural sway) Gait velocity:  decreased Stairs / Additional Locomotion Stairs: Yes Stairs Assistance: Contact Guard/Touching assist Stair Management Technique: Two rails Number of Stairs: 12 Height of Stairs: 6 Wheelchair Mobility Wheelchair Mobility: No   Discharge Criteria: Patient will be discharged from PT if patient refuses treatment 3 consecutive times without medical reason, if treatment goals not met, if there is a change in medical status, if patient makes no progress towards goals or if patient is discharged from hospital.  The above assessment, treatment plan, treatment alternatives and goals were discussed and mutually agreed upon: by patient  JDebbora Dus3/08/2021, 12:02 PM

## 2021-09-20 NOTE — Evaluation (Signed)
Occupational Therapy Assessment and Plan  Patient Details  Name: April Reilly MRN: [id number] Date of Birth: 06/02/1971  OT Diagnosis: cognitive deficits, disturbance of vision, and hemiplegia affecting non-dominant side Rehab Potential: Rehab Potential (ACUTE ONLY): Excellent ELOS: 14 days   Today's Date: 09/20/2021 OT Individual Time: 5035-4656 OT Individual Time Calculation (min): 60 min     Hospital Problem: Principal Problem:   Cerebral venous sinus thrombosis   Past Medical History:  Past Medical History:  Diagnosis Date   Asthma    Vaginal bleeding, abnormal    for 4 months   Past Surgical History:  Past Surgical History:  Procedure Laterality Date   FOOT SURGERY     TUBAL LIGATION      Assessment & Plan Clinical Impression:  April Reilly is a 51 year old female with history of SS trait, uterine fibroids, abnormal vaginal bleeding X 4 months otherwise in relatively good health who was admitted on 09/10/21 with 3 day history of malaise due to flu like symptoms with fall night prior to admission and progressive somnolence. Patient noted to be unresponsive with unequal pupils and dysconjugate gaze. She was started on broad spectrum antibiotics for fever and leucocytosis due to RLL aspiration PNA.  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  She was started on hypertonic saline and EEG done showing evidence of epileptogenicity with  high potential for seizures.     CTA head/heck showed multifocal narrowing in R>L A1, M1 and proximal R-M2 segments likely due to vasospasm and hypertensity and IPH in right cerebral hemispheres concerning for infarct with hemorrhagic conversion. CT venogram done showing subocclusive clot in distal right transverse sinus and right sigmoid dural venous sinus. She was started on IV heparin and neurosurgery contacted for  input as MRI brain showed concerns of uncal herniation.  Conservative care recommended with serial CT head which showed increase in midline shift to 11 mm. She was loaded with dilantin and with LT_EEG showing evidence of epileptogenicity and cortical dysfunction with moderate diffuse encephalopathy.Keppra added 02/26 and dilatin discontinued.  2D echo done revealing EF 60-65% with no wall abnormality. Mentation has improved and megestrol resumed on 02/04 for ongoing bleeding due to uterine fibroids.    Hypertonic saline weaned off with improvement in bouts of lethargy and electrolyte abnormalities resolved. Swallow function improved and she was advanced to regular diet. Repeat CTA/V done on 02/27 showing evolving hemorrhagic infarct with mild improvement in mass effect, no hydrocephalus and persistent but improved nonocclusive thrombus in right distal transverse and sigmoid sinus. Heparin transitioned to [place] on 02/28 and Neurology recommends 3-6 months of AC as well as close follow up with neurology. She has had issues with headaches managed with Fioricet and low dose  tramadol prn. Blood pressures have been on low side and she continues on metoprolol for management of tachycardia. She continues to have cognitive deficits with disorientation, left sided weakness with balance deficits and left inattention affecting ADLs and mobility. CIR recommended due to functional decline. Currently complains of weakness.  Patient transferred to CIR on 09/19/2021 .    Patient currently requires min with basic self-care skills secondary to decreased coordination, decreased visual motor skills, decreased attention to left, decreased attention, decreased awareness, and decreased memory, and decreased standing balance, decreased postural control, hemiplegia, and decreased balance strategies.  Prior to hospitalization, patient was fully independent and working full  time.   Patient will benefit from skilled intervention to increase  independence with basic self-care skills prior to discharge home with care partner.  Anticipate patient will require 24 hour supervision and follow up outpatient.  OT - End of Session Activity Tolerance: Tolerates 30+ min activity without fatigue Endurance Deficit: Yes Endurance Deficit Description: sometimes limited by headaches OT Assessment Rehab Potential (ACUTE ONLY): Excellent OT Patient demonstrates impairments in the following area(s): Balance;Cognition;Endurance;Motor OT Basic ADL's Functional Problem(s): Bathing;Dressing;Toileting;Grooming OT Advanced ADL's Functional Problem(s): Simple Meal Preparation;Light Housekeeping OT Transfers Functional Problem(s): Tub/Shower;Toilet OT Additional Impairment(s): Fuctional Use of Upper Extremity OT Plan OT Intensity: Minimum of 1-2 x/day, 45 to 90 minutes OT Frequency: 5 out of 7 days OT Duration/Estimated Length of Stay: 14 days OT Treatment/Interventions: Balance/vestibular training;Discharge planning;Cognitive remediation/compensation;DME/adaptive equipment instruction;Neuromuscular re-education;Pain management;Patient/family education;Psychosocial support;Self Care/advanced ADL retraining;Functional mobility training;Therapeutic Activities;Therapeutic Exercise;UE/LE Strength taining/ROM;UE/LE Coordination activities;Visual/perceptual remediation/compensation OT Self Feeding Anticipated Outcome(s): independent OT Basic Self-Care Anticipated Outcome(s): Mod I dressing, set up bathing OT Toileting Anticipated Outcome(s): mod I OT Bathroom Transfers Anticipated Outcome(s): supervision for ambulatory transfers OT Recommendation Recommendations for Other Services: Neuropsych consult;Therapeutic Recreation consult Therapeutic Recreation Interventions: [place] group Patient destination: Home Follow Up Recommendations: Outpatient OT Equipment Recommended: Tub/shower seat   OT Evaluation Precautions/Restrictions  Precautions Precautions:  Fall Restrictions Weight Bearing Restrictions: No   Pain Pain Assessment Pain Scale: 0-10 Pain Score: 3  Pain Location: Head Home Living/Prior Functioning Home Living Family/patient expects to be discharged to:: Private residence Living Arrangements: Alone Available Help at Discharge: Family, Available 24 hours/day Type of Home: House Home Access: Stairs to enter Technical brewer of Steps: 3 Entrance Stairs-Rails: None Home Layout: One level Bathroom Shower/Tub: Optometrist: No  Lives With: Alone Prior Function Level of Independence: Independent with basic ADLs, Independent with homemaking with ambulation, Independent with gait, Independent with transfers  Able to Take Stairs?: Yes Driving: Yes Vocation: Full time employment Vocation Requirements: Veterinary surgeon at [employer]: Hobbies-yes (Comment) (loves to cook) Vision Baseline Vision/History: 0 No visual deficits Ability to See in Adequate Light: 0 Adequate Patient Visual Report: No change from baseline Vision Assessment?: No apparent visual deficits Eye Alignment: Within Functional Limits Ocular Range of Motion: Within Functional Limits Alignment/Gaze Preference: Within Defined Limits Tracking/Visual Pursuits: Decreased smoothness of eye movement to LEFT inferior field;Decreased smoothness of eye movement to LEFT superior field Saccades: Decreased speed of saccadic movement (decreased speed to left) Visual Fields: No apparent deficits Perception  Perception: Impaired Inattention/Neglect: Does not attend to left visual field;Does not attend to left side of body Praxis Praxis: Intact Cognition Overall Cognitive Status: Impaired/Different from baseline Arousal/Alertness: Awake/alert Orientation Level: Person;Place;Situation Person: Oriented Place: Oriented Situation: Oriented Year: 2023 Month: March Day of Week: Incorrect Memory: Impaired Memory  Impairment: Storage deficit;Retrieval deficit Immediate Memory Recall: Sock;Blue;Bed Memory Recall Sock: With Cue Memory Recall Blue: Without Cue Memory Recall Bed: Without Cue Attention: Sustained Sustained Attention: Impaired Sustained Attention Impairment: Functional basic Awareness: Impaired Awareness Impairment: Intellectual impairment (decreased awareness of deficits) Problem Solving: Impaired Safety/Judgment: Appears intact Sensation Sensation Light Touch: Appears Intact Hot/Cold: Appears Intact Proprioception: Appears Intact Stereognosis: Appears Intact Coordination Gross Motor Movements are Fluid and Coordinated: No Fine Motor Movements are Fluid and Coordinated: No Coordination and Movement Description: decreased due to L hemiparesis Finger Nose Finger Test: 5x on L, 10x on R in 10 seconds. accurate but slow due to decreased L sh strength Heel Shin Test: limited AROM L LE Motor  Motor  Motor: Hemiplegia Motor - Skilled Clinical Observations: mild hemiparesis  Trunk/Postural Assessment  Cervical Assessment Cervical Assessment: Within Functional Limits Thoracic Assessment Thoracic Assessment: Within Functional Limits Lumbar Assessment Lumbar Assessment: Within Functional Limits Postural Control Postural Control: Deficits on evaluation Trunk Control: slight limitations to left due to hemiparesis  Balance Balance Balance Assessed: Yes Standardized Balance Assessment Standardized Balance Assessment: Berg Balance Test Berg Balance Test Sit to Stand: Able to stand without using hands and stabilize independently Standing Unsupported: Able to stand 2 minutes with supervision Sitting with Back Unsupported but Feet Supported on Floor or Stool: Able to sit 2 minutes under supervision Stand to Sit: Controls descent by using hands Transfers: Needs one person to assist Standing Unsupported with Eyes Closed: Able to stand 10 seconds with supervision Standing Ubsupported  with Feet Together: Needs help to attain position but able to stand for 30 seconds with feet together From Standing, Reach Forward with Outstretched Arm: Reaches forward but needs supervision From Standing Position, Pick up Object from Floor: Able to pick up shoe, needs supervision From Standing Position, Turn to Look Behind Over each Shoulder: Needs supervision when turning Turn 360 Degrees: Needs close supervision or verbal cueing Standing Unsupported, Alternately Place Feet on Step/Stool: Able to complete >2 steps/needs minimal assist Standing Unsupported, One Foot in Front: Needs help to step but can hold 15 seconds Standing on One Leg: Tries to lift leg/unable to hold 3 seconds but remains standing independently Total Score: 27 Static Sitting Balance Static Sitting - Balance Support: Feet supported Static Sitting - Level of Assistance: 5: Stand by assistance Dynamic Sitting Balance Dynamic Sitting - Balance Support: Feet supported Dynamic Sitting - Level of Assistance: 5: Stand by assistance Static Standing Balance Static Standing - Balance Support: No upper extremity supported Static Standing - Level of Assistance: 5: Stand by assistance Dynamic Standing Balance Dynamic Standing - Balance Support: During functional activity;No upper extremity supported Dynamic Standing - Level of Assistance: 4: Min assist Dynamic Standing - Balance Activities: Lateral lean/weight shifting;Forward lean/weight shifting;Other (comment) (standing to doff clothing over feet) Extremity/Trunk Assessment RUE Assessment RUE Assessment: Within Functional Limits LUE Assessment Passive Range of Motion (PROM) Comments: WFL Active Range of Motion (AROM) Comments: sh flexion and abd 90 degrees General Strength Comments: 3-/5 shoulder, 4-/5 elbow grasp  Care Tool Care Tool Self Care Eating   Eating Assist Level: Independent    Oral Care    Oral Care Assist Level: Set up assist    Bathing   Body parts  bathed by patient: Left arm;Chest;Abdomen;Right arm;Front perineal area;Buttocks;Right upper leg;Left upper leg;Face;Right lower leg;Left lower leg     Assist Level: Supervision/Verbal cueing    Upper Body Dressing(including orthotics)   What is the patient wearing?: Pull over shirt   Assist Level: Supervision/Verbal cueing    Lower Body Dressing (excluding footwear)   What is the patient wearing?: Underwear/pull up;Pants Assist for lower body dressing: Supervision/Verbal cueing    Putting on/Taking off footwear   What is the patient wearing?: Non-skid slipper socks Assist for footwear: Supervision/Verbal cueing       Care Tool Toileting Toileting activity   Assist for toileting: Supervision/Verbal cueing     Care Tool Bed Mobility Roll left and right activity   Roll left and right assist level: Independent    Sit to lying activity   Sit to lying assist level: Independent    Lying to sitting on side of bed activity   Lying to sitting on side of bed assist level:  the ability to move from lying on the back to sitting on the side of the bed with no back support.: Independent     Care Tool Transfers Sit to stand transfer   Sit to stand assist level: Supervision/Verbal cueing    Chair/bed transfer   Chair/bed transfer assist level: Contact Guard/Touching assist     Toilet transfer   Assist Level: Contact Guard/Touching assist     Care Tool Cognition  Expression of Ideas and Wants Expression of Ideas and Wants: 4. Without difficulty (complex and basic) - expresses complex messages without difficulty and with speech that is clear and easy to understand  Understanding Verbal and Non-Verbal Content Understanding Verbal and Non-Verbal Content: 4. Understands (complex and basic) - clear comprehension without cues or repetitions   Memory/Recall Ability Memory/Recall Ability : Current season;Location of own room;Staff names and faces;That he or she is in a hospital/hospital unit    Refer to Care Plan for Speculator 1 OT Short Term Goal 1 (Week 1): Pt will be able to reach LUE overhead to 140 to be able to don a shirt more effeciently. OT Short Term Goal 2 (Week 1): Pt will demonstrate increased awareness of L side weakness to use safe balance strategies when reaching to her L for items. OT Short Term Goal 3 (Week 1): Pt will be able to stand for 30 minutes doing functional activities with S for balance.  Recommendations for other services: Neuropsych and Surveyor, mining group   Skilled Therapeutic Intervention ADL ADL Eating: Independent Grooming: Setup Upper Body Bathing: Setup Where Assessed-Upper Body Bathing: Shower Lower Body Bathing: Supervision/safety Where Assessed-Lower Body Bathing: Shower Upper Body Dressing: Setup Where Assessed-Upper Body Dressing: Edge of bed Lower Body Dressing: Supervision/safety Where Assessed-Lower Body Dressing: Edge of bed Toileting: Supervision/safety Where Assessed-Toileting: Glass blower/designer: Therapist, music Method: Product/process development scientist Method: Heritage manager: Civil engineer, contracting with back;Grab bars Mobility  Bed Mobility Bed Mobility: Rolling Right;Rolling Left;Supine to Sit;Sit to Supine Rolling Right: Independent Rolling Left: Independent Supine to Sit: Supervision/Verbal cueing Sit to Supine: Supervision/Verbal cueing Transfers Sit to Stand: Supervision/Verbal cueing Stand to Sit: Supervision/Verbal cueing  Pt seen for initial evaluation and ADL training with a focus on balance, L side awareness.  In initial interview, pt unable to identify any deficits from her stroke.  Pt very energetic and eager to get going because she is eager to get home soon. Pt does have some L side weakness but does not impair her bed mobility or sit to stands.  She is limited in her dynamic standing  balance and ambulation skills.  Pt completed toileting, shower, dressing with close S and ambulation with CGA. Discussed role of OT, POC, goals, ELOS. Pt resting in bed with alarm set and all needs met.    Discharge Criteria: Patient will be discharged from OT if patient refuses treatment 3 consecutive times without medical reason, if treatment goals not met, if there is a change in medical status, if patient makes no progress towards goals or if patient is discharged from hospital.  The above assessment, treatment plan, treatment alternatives and goals were discussed and mutually agreed upon: by patient  [facility] 09/20/2021, 11:59 AM

## 2021-09-20 NOTE — Discharge Instructions (Addendum)
Inpatient Rehab Discharge Instructions  April Reilly Discharge date and time:  09/26/21  Activities/Precautions/ Functional Status: Activity: no lifting, driving, or strenuous exercise till cleared by MD.  Diet: regular diet  Wound Care: none needed   Functional status:  ___ No restrictions             ___ Walk up steps independently _X__ 24/7 supervision/assistance   ___ Walk up steps with assistance ___ Intermittent supervision/assistance  ___ Bathe/dress independently ___ Walk with walker     ___ Bathe/dress with assistance ___ Walk Independently             ___ Shower independently ___ Walk with assistance            _X__ Shower with assistance _X__ No alcohol             ___ Return to work/school ________   Special Instructions: Will need to follow up with neurology for clearance prior to resumption of driving. 2. Family needs to manage/administer medication to make sure that they are taken as prescribed. 3. Needs 24 hours supervision for safety till cleared by therapy/MD.  4. Will need to be on blood thinner for 3-6 months-->Neurology to decide on length of treatment. 3. Will need to have labs checked next week to follow up sodium level.  4. Contact Gynecology today or tomorrow to reschedule follow up appointment.    Per [facility] statutes, patients with seizures are not allowed to drive until  they have been seizure-free for six months. Use caution when using heavy equipment or power tools. Avoid working on ladders or at heights. Take showers instead of baths. Ensure the water temperature is not too high on the home water heater. Do not go swimming alone. When caring for infants or small children, sit down when holding, feeding, or changing them to minimize risk of injury to the child in the event you have a seizure. Also, Maintain good sleep hygiene. Avoid alcohol.     COMMUNITY REFERRALS UPON DISCHARGE:    Outpatient: PT  OT   SP             Agency:[facility] [place] [address] Phone: [phone]              Appointment Date/Time:WILL CALL DAUGHTER TO ARRANGE FOLLOW UP APPOINTMENTS  Medical Equipment/Items Ordered:ROLLING WALKER, TUB BENCH & 3 IN 1                                                 Agency/Supplier:[facility]  [phone]   STROKE/TIA DISCHARGE INSTRUCTIONS SMOKING Cigarette smoking nearly doubles your risk of having a stroke & is the single most alterable risk factor  If you smoke or have smoked in the last 12 months, you are advised to quit smoking for your health. Most of the excess cardiovascular risk related to smoking disappears within a year of stopping. Ask you doctor about anti-smoking medications [place] Quit Line: 1-800-QUIT NOW Free Smoking Cessation Classes [phone]  CHOLESTEROL Know your levels; limit fat & cholesterol in your diet  Lipid Panel     Component Value Date/Time   CHOL 139 09/11/2021 0422   TRIG 47 09/11/2021 0422   HDL 46 09/11/2021 0422   CHOLHDL 3.0 09/11/2021 0422   VLDL 9 09/11/2021 0422   LDLCALC 84  09/11/2021 0422     Many patients benefit from treatment even if their cholesterol is at goal. Goal: Total Cholesterol (CHOL) less than 160 Goal:  Triglycerides (TRIG) less than 150 Goal:  HDL greater than 40 Goal:  LDL (LDLCALC) less than 100   BLOOD PRESSURE American Stroke Association blood pressure target is less that 120/80 mm/Hg  Your discharge blood pressure is:  BP: 104/72 Monitor your blood pressure Limit your salt and alcohol intake Many individuals will require more than one medication for high blood pressure  DIABETES (A1c is a blood sugar average for last 3 months) Goal HGBA1c is under 7% (HBGA1c is blood sugar average for last 3 months)  Diabetes: No known diagnosis of diabetes    Lab Results  Component Value Date   HGBA1C 5.3 09/11/2021    Your HGBA1c can be lowered with medications, healthy diet, and exercise. Check your  blood sugar as directed by your physician Call your physician if you experience unexplained or low blood sugars.  PHYSICAL ACTIVITY/REHABILITATION Goal is 30 minutes at least 4 days per week  Activity: No driving, Therapies: see below Return to work:  Activity decreases your risk of heart attack and stroke and makes your heart stronger.  It helps control your weight and blood pressure; helps you relax and can improve your mood. Participate in a regular exercise program. Talk with your doctor about the best form of exercise for you (dancing, walking, swimming, cycling).  DIET/WEIGHT Goal is to maintain a healthy weight  Your discharge diet is:  Diet Order             Diet regular Room service appropriate? Yes; Fluid consistency: Thin  Diet effective now                   liquids Your height is:   Your current weight is:  Your Body Mass Index (BMI) is:   Following the type of diet specifically designed for you will help prevent another stroke. Your goal weight range is:   Your goal Body Mass Index (BMI) is 19-24. Healthy food habits can help reduce 3 risk factors for stroke:  High cholesterol, hypertension, and excess weight.  RESOURCES Stroke/Support Group:  Call [phone]   STROKE EDUCATION PROVIDED/REVIEWED AND GIVEN TO PATIENT Stroke warning signs and symptoms How to activate emergency medical system (call 911). Medications prescribed at discharge. Need for follow-up after discharge. Personal risk factors for stroke. Pneumonia vaccine given:  Flu vaccine given:  My questions have been answered, the writing is legible, and I understand these instructions.  I will adhere to these goals & educational materials that have been provided to me after my discharge from the hospital.      My questions have been answered and I understand these instructions. I will adhere to these goals and the provided educational materials after my discharge from the hospital.  Patient/Caregiver  Signature _______________________________ Date __________  Clinician Signature _______________________________________ Date __________  Please bring this form and your medication list with you to all your follow-up doctor's appointments.  Information on my medicine - ELIQUIS (apixaban)  This medication education was reviewed with me or my healthcare representative as part of my discharge preparation. Why was Eliquis prescribed for you? Eliquis was prescribed to treat blood clots that may have been found in the veins of your legs (deep vein thrombosis) or in your lungs (pulmonary embolism) and to reduce the risk of them occurring again.  What do You need to know  about Eliquis ? Your home dose of Eliquis is ONE 5 mg tablet taken TWICE daily.  Eliquis may be taken with or without food.   Try to take the dose about the same time in the morning and in the evening. If you have difficulty swallowing the tablet whole please discuss with your pharmacist how to take the medication safely.  Take Eliquis exactly as prescribed and DO NOT stop taking Eliquis without talking to the doctor who prescribed the medication.  Stopping may increase your risk of developing a new blood clot.  Refill your prescription before you run out.  After discharge, you should have regular check-up appointments with your healthcare provider that is prescribing your Eliquis.    What do you do if you miss a dose? If a dose of ELIQUIS is not taken at the scheduled time, take it as soon as possible on the same day and twice-daily administration should be resumed. The dose should not be doubled to make up for a missed dose.  Important Safety Information A possible side effect of Eliquis is bleeding. You should call your healthcare provider right away if you experience any of the following: Bleeding from an injury or your nose that does not stop. Unusual colored urine (red or dark brown) or unusual colored stools (red or  black). Unusual bruising for unknown reasons. A serious fall or if you hit your head (even if there is no bleeding).  Some medicines may interact with Eliquis and might increase your risk of bleeding or clotting while on Eliquis. To help avoid this, consult your healthcare provider or pharmacist prior to using any new prescription or non-prescription medications, including herbals, vitamins, non-steroidal anti-inflammatory drugs (NSAIDs) and supplements.  This website has more information on Eliquis (apixaban): http://www.eliquis.com/eliquis/home

## 2021-09-20 NOTE — Progress Notes (Signed)
Inpatient Rehabilitation  Patient information reviewed and entered into eRehab system by Elynor Kallenberger Vedanth Sirico, OTR/L.   Information including medical coding, functional ability and quality indicators will be reviewed and updated through discharge.    

## 2021-09-21 NOTE — Progress Notes (Addendum)
Occupational Therapy Session Note ? ?Patient Details  ?Name: April Reilly ?MRN: [id number] ?Date of Birth: 07-Jan-1971 ? ?Today's Date: 09/21/2021 ?OT Individual Time: 5217-4715 ?OT Individual Time Calculation (min): 27 min  ? ? ?Short Term Goals: ?Week 1:  OT Short Term Goal 1 (Week 1): Pt will be able to reach LUE overhead to 140 to be able to don a shirt more effeciently. ?OT Short Term Goal 2 (Week 1): Pt will demonstrate increased awareness of L side weakness to use safe balance strategies when reaching to her L for items. ?OT Short Term Goal 3 (Week 1): Pt will be able to stand for 30 minutes doing functional activities with S for balance. ? ?Skilled Therapeutic Interventions/Progress Updates:  ?Patient met lying supine in bed. Reports waking up at 5am believing therapy would start at that time. Patient politely declines therapy at this time request this writer to return later. Patient also reports having a headache and receiving pain meds prior to this writers entry. Patient missed 48 min of skilled OT treatment session. This Probation officer returned and patient ultimately in agreement with OT treatment session. Reports headache still present. Patient requiring increased time to get out of bed. Supervision A for supine to EOB. Patient then donned overhead shirt with set-up assist. Declines donning LB clothing reporting daughter to bring more pants later this date. 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 Reports she does not feel like she needs inpatient rehab as family is able to assist her at home. Patient demonstrates poor insight, decreased knowledge of current deficits and decreased attention to L throughout session. Sit to stand from EOB with close supervision A. CGA for mobility with patient reaching out for surfaces to steady herself. At sink level patient completed 2/3 grooming tasks with close supervision A. Patient then walked away from the sink toward the bathroom before turning off the water  reporting need to toilet. Toilet transfer with close supervision A and 3/3 parts of toileting task with supervision to [name]. Return to supine with supervision A. Session concluded with patient lying supine in bed with call bell within reach, bed alarm activated and all needs met.  ? ?Attempted to make up missed minutes this afternoon but patient declined.  ? ?Therapy Documentation ?Precautions:  ?Precautions ?Precautions: Fall ?Restrictions ?Weight Bearing Restrictions: No ?General: ?General ?OT Amount of Missed Time: 48 Minutes ?Therapy/Group: Individual Therapy ? ?Aaniya Sterba R How[place]-Davis ?09/21/2021, 7:01 AM ?

## 2021-09-21 NOTE — Progress Notes (Signed)
?                                                       PROGRESS NOTE ? ? ?Subjective/Complaints: ?She expresses no new complaints today ?Would like to go home ?Has significant cognitive/physical deficits as per team, will need at least a week here ? ?ROS: +anxious to return home ? ?Objective: ?  ?No results found. ?Recent Labs  ?  09/20/21 ?1914  ?WBC 6.6  ?HGB 8.5*  ?HCT 29.7*  ?PLT 318  ? ?Recent Labs  ?  09/20/21 ?7829  ?NA 141  ?K 3.8  ?CL 108  ?CO2 22  ?GLUCOSE 104*  ?BUN 11  ?CREATININE 0.73  ?CALCIUM 9.4  ? ? ?Intake/Output Summary (Last 24 hours) at 09/21/2021 1118 ?Last data filed at 09/21/2021 5621 ?Gross per 24 hour  ?Intake 360 ml  ?Output --  ?Net 360 ml  ?  ? ?  ? ?Physical Exam: ?Vital Signs ?Blood pressure 90/63, pulse 99, temperature 99.1 ?F (37.3 ?C), resp. rate 18, SpO2 99 %. ?Gen: no distress, normal appearing ?HEENT: oral mucosa pink and moist, NCAT ?Cardio: Reg rate ?Chest: normal effort, normal rate of breathing ?Abd: soft, non-distended ?Ext: no edema ?Psych: pleasant, normal affect ?Skin: intact ?Neurological:  ?   Mental Status: She is alert and oriented to person, place, and time.  ?   Comments: Speech clear but distracted by pain. She was able to follow simple motor commands. Had difficulty recalling medical history. LUE weakness noted.  Poor insight into deficits ?  ? ? ?Assessment/Plan: ?1. Functional deficits which require 3+ hours per day of interdisciplinary therapy in a comprehensive inpatient rehab setting. ?Physiatrist is providing close team supervision and 24 hour management of active medical problems listed below. ?Physiatrist and rehab team continue to assess barriers to discharge/monitor patient progress toward functional and medical goals ? ?Care Tool: ? ?Bathing ?   ?Body parts bathed by patient: Left arm, Chest, Abdomen, Right arm, Front perineal area, Buttocks, Right upper leg, Left upper leg, Face, Right lower leg, Left lower leg  ?   ?  ?  ?Bathing assist Assist Level:  Supervision/Verbal cueing ?  ?  ?Upper Body Dressing/Undressing ?Upper body dressing   ?What is the patient wearing?: Pull over shirt ?   ?Upper body assist Assist Level: Contact Guard/Touching assist ?   ?Lower Body Dressing/Undressing ?Lower body dressing ? ? ?   ?What is the patient wearing?: Pants ? ?  ? ?Lower body assist Assist for lower body dressing: Contact Guard/Touching assist ?   ? ?Toileting ?Toileting    ?Toileting assist Assist for toileting: Supervision/Verbal cueing ?  ?  ?Transfers ?Chair/bed transfer ? ?Transfers assist ?   ? ?Chair/bed transfer assist level: Supervision/Verbal cueing ?  ?  ?Locomotion ?Ambulation ? ? ?Ambulation assist ? ?   ? ?Assist level: Minimal Assistance - Patient > 75% ?Assistive device: No Device ?Max distance: 250  ? ?Walk 10 feet activity ? ? ?Assist ?   ? ?Assist level: Minimal Assistance - Patient > 75% ?Assistive device: No Device  ? ?Walk 50 feet activity ? ? ?Assist   ? ?Assist level: Minimal Assistance - Patient > 75% ?Assistive device: No Device  ? ? ?Walk 150 feet activity ? ? ?Assist   ? ?Assist level: Minimal Assistance - Patient >  75% ?Assistive device: No Device ?  ? ?Walk 10 feet on uneven surface  ?activity ? ? ?Assist Walk 10 feet on uneven surfaces activity did not occur: Safety/medical concerns ? ? ?  ?   ? ?Wheelchair ? ? ? ? ?Assist Is the patient using a wheelchair?: No ?  ?  ? ?  ?   ? ? ?Wheelchair 50 feet with 2 turns activity ? ? ? ?Assist ? ?  ?  ? ? ?   ? ?Wheelchair 150 feet activity  ? ? ? ?Assist ?   ? ? ?   ? ?Blood pressure 90/63, pulse 99, temperature 99.1 ?F (37.3 ?C), resp. rate 18, SpO2 99 %. ? ?Medical Problem List and Plan: ?1. 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            -patient may shower ?            -ELOS/Goals: 10-14 days S ?            Discussed deficits with team ?2.  Antithrombotics: ?-DVT/anticoagulation:  Pharmaceutical: Other (comment)--Eliquis ?            -antiplatelet therapy: N/A ?3.  Headaches: Continue Fioricet (has been using TID on average). Check magnesium level tomorrow.  ?--May need Topamax as continues to report significant headaches.  ?4. Mood:  LCSW to follow for evaluation and support.  ?            -antipsychotic agents: N/a ?5. Neuropsych: This patient is not fully  capable of making decisions on his own behalf. ?6. Skin/Wound Care: Routine pressure relief measures.  ?7. Fluids/Electrolytes/Nutrition: Monitor I/O. Check CMET in am. ?8. SS trait/ABLA: Menses X 4 months due to bleeding fibroids. Continue to monitor H/H as now on Ac.  ?9. Concern for Seizure: Continue Keppra 500 mg bid.  ?10. Tachycardia: monitor for tachycardia off lopressor (stopped due to hypotension) ?11. Hyperlipidemia: continueLipitor.  ?12. Suboptimal potassium:supplement with 20 meq klor, repeat potassium on Monday ?13.Hypotension: discontinue lopressor.  ?14.Overweight: BMI 27.12 provide dietary education ?15.Hypoalbuminemia: educate regarding low sugar/high protein diet.  ? ?LOS: ?2 days ?A FACE TO FACE EVALUATION WAS PERFORMED ? ?Martha Clan P Payton Moder ?09/21/2021, 11:18 AM  ? ?  ?

## 2021-09-21 NOTE — Progress Notes (Signed)
Speech Language Pathology Daily Session Note ? ?Patient Details  ?Name: April Reilly ?MRN: [id number] ?Date of Birth: 1970/10/31 ? ?Today's Date: 09/21/2021 ?SLP Individual Time: 1020-1100 ?SLP Individual Time Calculation (min): 40 min ? ?Short Term Goals: ?Week 1: SLP Short Term Goal 1 (Week 1): Patient will demonstrate selective attention by completing in functional tasks in a mildly distracting environment with min A verbal redirection ?SLP Short Term Goal 2 (Week 1): Patient will demonstrate mildly complex problem solving with min-to-mod A verbal/visual cues ?SLP Short Term Goal 3 (Week 1): Patient will demonstrate awareness and repair errors during functional and novel tasks with min-to-mod A verbal/visual cues ?SLP Short Term Goal 4 (Week 1): Patient will verbalize at least 2 safety precautions associated with new limitation(s) with min-to-mod A cues ?SLP Short Term Goal 5 (Week 1): Patient will utilize compensatory memory strategies to recall novel information with min-to-mod A verbal/visual cues ? ?Skilled Therapeutic Interventions: Skilled ST treatment focused on cognitive goals. Session impacted by frequent external distractions, phone calls, and visitors in-and-out. Nursing was speaking with patient prior to session regarding rehab process. Following nurse's departure, pt was adamant that she knows what to do when she gets home and reported her family will "do everything for me." Pt with limited insight or understanding of why rehab services have been recommended despite multiple discussions with various disciplines and MD. SLP provided consistent encouragement and positive regard for participation in therapy in effort to increase functional independence, safety, and to decrease caregiver burden. Pt continues to express she doesn't understand why it's necessary, "I can do everything fine." SLP facilitated session by providing overall sup A verbal cues for organizing BID pillbox to achieve 70% accuracy and  mod A cues for identifying errors x2, and sup A cues for generating appropriate solutions. Patient would frequently state she would not make errors with managing her medication at home because she never needed support prior to now. SLP facilitated discussion/education on cognitive changes post CVA that may impact awareness, efficiency, and overall independence with common tasks. Pt reports her daughter will be able to support with medication management at discharge. Patient was left in bed with alarm activated and immediate needs within reach at end of session. Continue per current plan of care.   ?   ?Pain ?Pain Assessment ?Pain Scale: 0-10 ?Pain Score: 8  ?Pain Location: Head ?Pain Descriptors / Indicators: Headache ?Pain Onset: Gradual ?Pain Intervention(s): RN made aware ? ?Therapy/Group: Individual Therapy ? ?Marquett Bertoli T Taqwa Deem ?09/21/2021, 11:05 AM ?

## 2021-09-21 NOTE — Progress Notes (Signed)
Patient ID: April Reilly, female   DOB: Apr 17, 1971, 51 y.o.   MRN: [id number] ?Met with the patient, daughter and son in law to introduce self, review rehab process, team conference and plan of care. Patient reports she wants to go home today; doesn't feel rehab is needed. She reports having completed self care herself and expressed frustration with the rehab therapy schedules. Discussed situation and medications ordered with rationale for seizure prophylaxis and pradaxa x 3 months per MD. Daughter has questions about "brain swelling status" and healing post stroke; referred to neurologist. Discussed secondary risks including HTN, HLD (LDL 84/Trig 47). 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 She is anxious to return to her own home and resume work. Discussed FMLA post event; daughter to follow up with employer for forms. Patient's daughter noted there are two steps to entry of home and then home is flat and bathroom is directly across from the bedroom. Patient noted her four daughters and extended family will be able to help her out and provide for care needs at discharge. Continue to follow along to discharge to address educational needs and facilitate preparation for discharge. Dorien Chihuahua B ? ?

## 2021-09-21 NOTE — Progress Notes (Signed)
Physical Therapy Session Note ? ?Patient Details  ?Name: April Reilly ?MRN: [id number] ?Date of Birth: [birth year] ? ?Today's Date: 09/21/2021 ?PT Missed Time: 91 Minutes ?Missed Time Reason: Patient unwilling to participate ? ?Pt supine in bed with family member at bedside. Unable to encourage, convince, or motivate her to participate in PT session. Pt reporting she's already done enough "PT" today (this was her 1st scheduled PT session, the other sessions were OT/SLP). Family member encouraging her but she continued to refuse. She missed 75 minutes of skilled PT. ? ?Alger Simons PT ?09/21/2021, 7:45 AM  ?

## 2021-09-22 DIAGNOSIS — R000 Tachycardia, unspecified: Secondary | ICD-10-CM

## 2021-09-22 DIAGNOSIS — D573 Sickle-cell trait: Secondary | ICD-10-CM

## 2021-09-22 NOTE — IPOC Note (Signed)
Overall Plan of Care (IPOC) ?Patient Details ?Name: April Reilly ?MRN: [id number] ?DOB: 1971-05-23 ? ?Admitting Diagnosis: Cerebral venous sinus thrombosis ? ?Hospital Problems: Principal Problem: ?  Cerebral venous sinus thrombosis ? ? ? ? Functional Problem List: ?Nursing Bladder, Medication Management, Safety, Bowel, Pain, Endurance  ?PT Balance, Behavior, Endurance, Motor, Perception, Safety, Sensory, Skin Integrity  ?OT Balance, Cognition, Endurance, Motor  ?SLP Cognition  ?TR    ?    ? Basic ADL?s: ?OT Bathing, Dressing, Toileting, Grooming  ? ?  Advanced  ADL?s: ?OT Simple Meal Preparation, Light Housekeeping  ?   ?Transfers: ?PT Bed Mobility, Bed to Chair, Car, Furniture  ?OT Tub/Shower, Toilet  ? ?  Locomotion: ?PT Ambulation, Stairs  ? ?  Additional Impairments: ?OT Fuctional Use of Upper Extremity  ?SLP Social Cognition ?  ?Attention, Memory, Problem Solving, Awareness  ?TR    ? ? ?Anticipated Outcomes ?Item Anticipated Outcome  ?Self Feeding independent  ?Swallowing ?   ?  ?Basic self-care ? Mod I dressing, set up bathing  ?Toileting ? mod I ?  ?Bathroom Transfers supervision for ambulatory transfers  ?Bowel/Bladder ? manage bowel w mod I assist and bladder w toileting protocol  ?Transfers ? spv  ?Locomotion ? spv  ?Communication ?    ?Cognition ? sup-to-min a  ?Pain ? pain at or below level 4 w prns  ?Safety/Judgment ? maintain safety w cues  ? ?Therapy Plan: ?PT Intensity: Minimum of 1-2 x/day ,45 to 90 minutes ?PT Frequency: 5 out of 7 days ?PT Duration Estimated Length of Stay: 2 weeks ?OT Intensity: Minimum of 1-2 x/day, 45 to 90 minutes ?OT Frequency: 5 out of 7 days ?OT Duration/Estimated Length of Stay: 14 days ?SLP Intensity: Minumum of 1-2 x/day, 30 to 90 minutes ?SLP Frequency: 3 to 5 out of 7 days ?SLP Duration/Estimated Length of Stay: 2 weeks  ? ?Due to the current state of emergency, patients may not be receiving their 3-hours of Medicare-mandated therapy. ? ? Team Interventions: ?Nursing  Interventions Bladder Management, Disease Management/Prevention, Medication Management, Discharge Planning, Pain Management, Bowel Management, Patient/Family Education  ?PT interventions Ambulation/gait training, Discharge planning, Functional mobility training, Psychosocial support, Therapeutic Activities, Visual/perceptual remediation/compensation, Balance/vestibular training, Disease management/prevention, Neuromuscular re-education, Skin care/wound management, Therapeutic Exercise, Wheelchair propulsion/positioning, Cognitive remediation/compensation, DME/adaptive equipment instruction, Pain management, Splinting/orthotics, UE/LE Strength taining/ROM, Community reintegration, Functional electrical stimulation, Patient/family education, Stair training, UE/LE Coordination activities  ?OT Interventions Balance/vestibular training, Discharge planning, Cognitive remediation/compensation, DME/adaptive equipment instruction, Neuromuscular re-education, Pain management, Patient/family education, Psychosocial support, Self Care/advanced ADL retraining, Functional mobility training, Therapeutic Activities, Therapeutic Exercise, UE/LE Strength taining/ROM, UE/LE Coordination activities, Visual/perceptual remediation/compensation  ?SLP Interventions Cognitive remediation/compensation, Internal/external aids, Cueing hierarchy, Functional tasks, Patient/family education, Therapeutic Activities  ?TR Interventions    ?SW/CM Interventions Discharge Planning, Psychosocial Support, Patient/Family Education  ? ?Barriers to Discharge ?MD  Medical stability  ?Nursing Decreased caregiver support, Home environment access/layout, Incontinence ?1 leve solo PTA; dtr to assist at discharge  ?PT Decreased caregiver support, Lack of/limited family support, Behavior ?   ?OT   ?   ?SLP   ?   ?SW Insurance for SNF coverage ?   ? ?Team Discharge Planning: ?Destination: PT-Home ,OT- Home , SLP-Home ?Projected Follow-up: PT-Outpatient PT, 24 hour  supervision/assistance, OT-  Outpatient OT, SLP-24 hour supervision/assistance, Outpatient SLP ?Projected Equipment Needs: PT-To be determined, OT- Tub/shower seat, SLP-None recommended by SLP ?Equipment Details: PT- , OT-  ?Patient/family involved in discharge planning: PT- Patient,  OT-Patient, SLP-Patient ? ?MD ELOS: 10-14 days ?  Medical Rehab Prognosis:  Excellent ?Assessment: The patient has been admitted for CIR therapies with the diagnosis of central venous sinus thrombosis.The team will be addressing functional mobility, strength, stamina, balance, safety, adaptive techniques and equipment, self-care, bowel and bladder mgt, patient and caregiver education. Goals have been set at supervision. Anticipated discharge destination is home. ? ? ? ?See Team Conference Notes for weekly updates to the plan of care  ?

## 2021-09-22 NOTE — Progress Notes (Signed)
?                                                       PROGRESS NOTE ? ? ?Subjective/Complaints: ?Pt up in bed. Asking if she can have therapist from [place] whom she know and has worked for ? ?ROS: Limited due to cognitive/behavioral  ? ?Objective: ?  ?No results found. ?Recent Labs  ?  09/20/21 ?9147  ?WBC 6.6  ?HGB 8.5*  ?HCT 29.7*  ?PLT 318  ? ?Recent Labs  ?  09/20/21 ?8295  ?NA 141  ?K 3.8  ?CL 108  ?CO2 22  ?GLUCOSE 104*  ?BUN 11  ?CREATININE 0.73  ?CALCIUM 9.4  ? ? ?Intake/Output Summary (Last 24 hours) at 09/22/2021 0950 ?Last data filed at 09/21/2021 1945 ?Gross per 24 hour  ?Intake 360 ml  ?Output --  ?Net 360 ml  ?  ? ?  ? ?Physical Exam: ?Vital Signs ?Blood pressure (!) 109/55, pulse 100, temperature 98.1 ?F (36.7 ?C), temperature source Oral, resp. rate 18, SpO2 97 %. ?Constitutional: No distress . Vital signs reviewed. ?HEENT: NCAT, EOMI, oral membranes moist ?Neck: supple ?Cardiovascular: RRR without murmur. No JVD    ?Respiratory/Chest: CTA Bilaterally without wheezes or rales. Normal effort    ?GI/Abdomen: BS +, non-tender, non-distended ?Ext: no clubbing, cyanosis, or edema ?Psych: pleasant and cooperative  ?Skin: intact ?Neurological:  ?   Mental Status: She is alert and oriented to person, place, and time.  ?   Comments: fair attention. Limited insight and awareness. Confused. Had difficulty recalling medical history. LUE weakness noted.   ?  ? ? ?Assessment/Plan: ?1. Functional deficits which require 3+ hours per day of interdisciplinary therapy in a comprehensive inpatient rehab setting. ?Physiatrist is providing close team supervision and 24 hour management of active medical problems listed below. ?Physiatrist and rehab team continue to assess barriers to discharge/monitor patient progress toward functional and medical goals ? ?Care Tool: ? ?Bathing ?   ?Body parts bathed by patient: Left arm, Chest, Abdomen, Right arm, Front perineal area, Buttocks, Right upper leg, Left upper leg, Face, Right  lower leg, Left lower leg  ?   ?  ?  ?Bathing assist Assist Level: Supervision/Verbal cueing ?  ?  ?Upper Body Dressing/Undressing ?Upper body dressing   ?What is the patient wearing?: Pull over shirt ?   ?Upper body assist Assist Level: Contact Guard/Touching assist ?   ?Lower Body Dressing/Undressing ?Lower body dressing ? ? ?   ?What is the patient wearing?: Pants ? ?  ? ?Lower body assist Assist for lower body dressing: Contact Guard/Touching assist ?   ? ?Toileting ?Toileting    ?Toileting assist Assist for toileting: Supervision/Verbal cueing ?  ?  ?Transfers ?Chair/bed transfer ? ?Transfers assist ?   ? ?Chair/bed transfer assist level: Supervision/Verbal cueing ?  ?  ?Locomotion ?Ambulation ? ? ?Ambulation assist ? ?   ? ?Assist level: Minimal Assistance - Patient > 75% ?Assistive device: No Device ?Max distance: 250  ? ?Walk 10 feet activity ? ? ?Assist ?   ? ?Assist level: Minimal Assistance - Patient > 75% ?Assistive device: No Device  ? ?Walk 50 feet activity ? ? ?Assist   ? ?Assist level: Minimal Assistance - Patient > 75% ?Assistive device: No Device  ? ? ?Walk 150 feet activity ? ? ?Assist   ? ?  Assist level: Minimal Assistance - Patient > 75% ?Assistive device: No Device ?  ? ?Walk 10 feet on uneven surface  ?activity ? ? ?Assist Walk 10 feet on uneven surfaces activity did not occur: Safety/medical concerns ? ? ?  ?   ? ?Wheelchair ? ? ? ? ?Assist Is the patient using a wheelchair?: No ?  ?  ? ?  ?   ? ? ?Wheelchair 50 feet with 2 turns activity ? ? ? ?Assist ? ?  ?  ? ? ?   ? ?Wheelchair 150 feet activity  ? ? ? ?Assist ?   ? ? ?   ? ?Blood pressure (!) 109/55, pulse 100, temperature 98.1 ?F (36.7 ?C), temperature source Oral, resp. rate 18, SpO2 97 %. ? ?Medical Problem List and Plan: ?1. 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            -patient may shower ?            -ELOS/Goals: 10-14 days S ?            -Continue CIR therapies including PT, OT, and SLP  ?2.   Antithrombotics: ?-DVT/anticoagulation:  Pharmaceutical: Other (comment)--Eliquis ?            -antiplatelet therapy: N/A ?3. Headaches: Continue Fioricet (has been using TID on average).   ?--consider Topamax   ?4. Mood:  LCSW to follow for evaluation and support.  ?            -antipsychotic agents: N/a ?5. Neuropsych: This patient is not fully  capable of making decisions on his own behalf. ?6. Skin/Wound Care: Routine pressure relief measures.  ?7. Fluids/Electrolytes/Nutrition: Monitor I/O.   ? -poor po intake---encourage PO ? 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 Continue to monitor H/H as now on Ac.  ?9. Concern for Seizure: Continue Keppra 500 mg bid.  ?10. Tachycardia: Continue metoprolol bid.  ?            --BP soft in high 90-low 100 range without symptoms.  ?11. Hyperlipidemia: Continue Lipitor.  ?12. Suboptimal potassium:supplement with 20 meq klor ?13.Hypotension: decrease lopressor to 12.'5mg'$ BID ?14.Overweight: provide dietary education ? ?LOS: ?3 days ?A FACE TO FACE EVALUATION WAS PERFORMED ? ?Meredith Staggers ?09/22/2021, 9:50 AM  ? ?  ?

## 2021-09-22 NOTE — Progress Notes (Signed)
Physical Therapy Session Note ? ?Patient Details  ?Name: April Reilly ?MRN: [id number] ?Date of Birth: 02/16/1971 ? ?Today's Date: 09/22/2021 ?PT Missed Time: 60 Minutes ?Missed Time Reason: Patient unwilling to participate ? ?Short Term Goals: ?Week 1:  PT Short Term Goal 1 (Week 1): Patient will improve Berg score by >/= 7 points ?PT Short Term Goal 2 (Week 1): Patient will participate in FGA ?PT Short Term Goal 3 (Week 1): Patient will ambulate >175f with no more than CGA and LRAD ? ?Skilled Therapeutic Interventions/Progress Updates:  ?  Patient received sitting up in recliner, family at bedside. She denies pain. Patient reporting just receiving lunch from family members, requesting to eat. PT checking back on patient later and she was still not agreeable to PT. Remaining in recliner, needs within reach.  ? ?Therapy Documentation ?Precautions:  ?Precautions ?Precautions: Fall ?Restrictions ?Weight Bearing Restrictions: No ? ? ? ? ?Therapy/Group: Individual Therapy ? ?JDebbora Dus?JDebbora Dus PT, DPT, CBIS ? ?09/22/2021, 7:35 AM  ?

## 2021-09-23 MED ORDER — MIRTAZAPINE 15 MG PO TABS
15.0000 mg | ORAL_TABLET | Freq: Every day | ORAL | Status: DC
  Administered 2021-09-23 – 2021-09-25 (×3): 15 mg via ORAL
  Filled 2021-09-23 (×3): qty 1

## 2021-09-23 NOTE — Progress Notes (Signed)
Occupational Therapy Session Note ? ?Patient Details  ?Name: April Reilly ?MRN: [id number] ?Date of Birth: [birth year] ? ?Session 1 ?Today's Date: 09/23/2021 ?OT Individual Time: 1130-1200 ?OT Individual Time Calculation (min): 30 min  ? ?Session 2 ? ?Today's Date: 09/23/2021 ?OT Individual Time: 4492-0100 ?OT Individual Time Calculation (min): 40 min  ?30 min missed d/t fatigue/refusal  ? ? ?Short Term Goals: ?Week 1:  OT Short Term Goal 1 (Week 1): Pt will be able to reach LUE overhead to 140 to be able to don a shirt more effeciently. ?OT Short Term Goal 2 (Week 1): Pt will demonstrate increased awareness of L side weakness to use safe balance strategies when reaching to her L for items. ?OT Short Term Goal 3 (Week 1): Pt will be able to stand for 30 minutes doing functional activities with S for balance. ? ?Skilled Therapeutic Interventions/Progress Updates:  ?  Session 1; ?Pt received supine with 7/10 HA reporting she is premedicated and agreeable to OT session. Pt came to EOB with (S). She sat EOB and completed closed chain BUE strengthening circuit and completes 2x10-15 dumbbell therex for BUE shoulder strengthening and LUE NMR required for BADLs/functional transfers as follows with demo cuing and 3 #. Pt completed AROM IR/ER to work on joint stability and flexibility. ? ?Shoulder flex/ext, shoulder press ?Elbow flex/ext, chest press ? ?Pt was left sitting EOB with all needs met, bed alarm set.  ? ? ?Session 2: ?Pt received in bed with c/o fatigue and HA, un-rated but, agreeable to OT session focused on ADLs at shower level. Discussed d/c planning in length. Pt completed bed mobility to EOB with (S). Pt completed ambulatory transfer to the bathroom with (S) overall using the RW. She did have a L LOB when sitting on the toilet but was able to self correct to safely land on toilet seat. She completed toileting tasks with (S) overall. She transferred into the shower with (S), min cueing for safety. She completed all  bathing at (S) level sit <> stand from shower chair. Transfer back to the EOB with (S). Shirt and underwear donned with (S). She was left EOB, declining any further participation with OT. 30 min missed. Bed alarm set.  ? ? ?Therapy Documentation ?Precautions:  ?Precautions ?Precautions: Fall ?Restrictions ?Weight Bearing Restrictions: No ? ?Therapy/Group: Individual Therapy ? ?Curtis Sites ?09/23/2021, 6:57 AM ?

## 2021-09-23 NOTE — Progress Notes (Signed)
?                                                       PROGRESS NOTE ? ? ?Subjective/Complaints: ?Pt awake, no new problems. Appears to have slept last night.  ? ?ROS: Limited due to cognitive/behavioral  ? ?Objective: ?  ?No results found. ?No results for input(s): WBC, HGB, HCT, PLT in the last 72 hours. ? ?No results for input(s): NA, K, CL, CO2, GLUCOSE, BUN, CREATININE, CALCIUM in the last 72 hours. ? ?No intake or output data in the 24 hours ending 09/23/21 0830 ?  ? ?  ? ?Physical Exam: ?Vital Signs ?Blood pressure 99/70, pulse 97, temperature 98.3 ?F (36.8 ?C), temperature source Oral, resp. rate 18, SpO2 97 %. ?Constitutional: No distress . Vital signs reviewed. ?HEENT: NCAT, EOMI, oral membranes moist ?Neck: supple ?Cardiovascular: RRR without murmur. No JVD    ?Respiratory/Chest: CTA Bilaterally without wheezes or rales. Normal effort    ?GI/Abdomen: BS +, non-tender, non-distended ?Ext: no clubbing, cyanosis, or edema ?Psych: pleasant and cooperative  ?Skin: intact ?Neurological:  ?   Mental Status: She is alert and oriented to person, place, and time.  ?   Comments: fair attention, distracted at times. Limited insight and awareness. Confused. Had difficulty recalling medical history. LUE weakness noted.   ?  ? ? ?Assessment/Plan: ?1. Functional deficits which require 3+ hours per day of interdisciplinary therapy in a comprehensive inpatient rehab setting. ?Physiatrist is providing close team supervision and 24 hour management of active medical problems listed below. ?Physiatrist and rehab team continue to assess barriers to discharge/monitor patient progress toward functional and medical goals ? ?Care Tool: ? ?Bathing ?   ?Body parts bathed by patient: Left arm, Chest, Abdomen, Right arm, Front perineal area, Buttocks, Right upper leg, Left upper leg, Face, Right lower leg, Left lower leg  ?   ?  ?  ?Bathing assist Assist Level: Supervision/Verbal cueing ?  ?  ?Upper Body Dressing/Undressing ?Upper  body dressing   ?What is the patient wearing?: Pull over shirt ?   ?Upper body assist Assist Level: Contact Guard/Touching assist ?   ?Lower Body Dressing/Undressing ?Lower body dressing ? ? ?   ?What is the patient wearing?: Pants ? ?  ? ?Lower body assist Assist for lower body dressing: Contact Guard/Touching assist ?   ? ?Toileting ?Toileting    ?Toileting assist Assist for toileting: Supervision/Verbal cueing ?  ?  ?Transfers ?Chair/bed transfer ? ?Transfers assist ?   ? ?Chair/bed transfer assist level: Supervision/Verbal cueing ?  ?  ?Locomotion ?Ambulation ? ? ?Ambulation assist ? ?   ? ?Assist level: Minimal Assistance - Patient > 75% ?Assistive device: No Device ?Max distance: 250  ? ?Walk 10 feet activity ? ? ?Assist ?   ? ?Assist level: Minimal Assistance - Patient > 75% ?Assistive device: No Device  ? ?Walk 50 feet activity ? ? ?Assist   ? ?Assist level: Minimal Assistance - Patient > 75% ?Assistive device: No Device  ? ? ?Walk 150 feet activity ? ? ?Assist   ? ?Assist level: Minimal Assistance - Patient > 75% ?Assistive device: No Device ?  ? ?Walk 10 feet on uneven surface  ?activity ? ? ?Assist Walk 10 feet on uneven surfaces activity did not occur: Safety/medical concerns ? ? ?  ?   ? ?  Wheelchair ? ? ? ? ?Assist Is the patient using a wheelchair?: No ?  ?  ? ?  ?   ? ? ?Wheelchair 50 feet with 2 turns activity ? ? ? ?Assist ? ?  ?  ? ? ?   ? ?Wheelchair 150 feet activity  ? ? ? ?Assist ?   ? ? ?   ? ?Blood pressure 99/70, pulse 97, temperature 98.3 ?F (36.8 ?C), temperature source Oral, resp. rate 18, SpO2 97 %. ? ?Medical Problem List and Plan: ?1. 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            -patient may shower ?            -ELOS/Goals: 10-14 days S ?            -Continue CIR therapies including PT, OT, and SLP  ?2.  Antithrombotics: ?-DVT/anticoagulation:  Pharmaceutical: Other (comment)--Eliquis ?            -antiplatelet therapy: N/A ?3. Headaches: Continue  Fioricet (has been using TID on average).   ?--consider Topamax   ?4. Mood:  LCSW to follow for evaluation and support.  ?            -antipsychotic agents: N/a ?5. Neuropsych: This patient is not fully  capable of making decisions on his own behalf. ?6. Skin/Wound Care: Routine pressure relief measures.  ?7. Fluids/Electrolytes/Nutrition: Monitor I/O.   ? -poor po intake---encourage PO ? -on low dose megace currently--hesitate increasing given thrombosis, SST ? 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 Continue to monitor H/H as now on Ac.  ?9. Concern for Seizure: Continue Keppra 500 mg bid.  ?10. Tachycardia: Continue metoprolol bid.  ?            --BP soft in high 90-low 100 range without symptoms.  ?11. Hyperlipidemia: Continue Lipitor.  ?12. Suboptimal potassium:supplement with 20 meq klor ?13.Hypotension: decrease lopressor to 12.'5mg'$ BID ?14.Overweight: provide dietary education ? ?LOS: ?4 days ?A FACE TO FACE EVALUATION WAS PERFORMED ? ?Meredith Staggers ?09/23/2021, 8:30 AM  ? ?  ?

## 2021-09-23 NOTE — Progress Notes (Signed)
Physical Therapy Session Note ? ?Patient Details  ?Name: April Reilly ?MRN: [id number] ?Date of Birth: [birth year] ? ?Today's Date: 09/23/2021 ?PT Individual Time: 2694-8546 ?PT Individual Time Calculation (min): 25 min  ? ?Short Term Goals: ?Week 1:  PT Short Term Goal 1 (Week 1): Patient will improve Berg score by >/= 7 points ?PT Short Term Goal 2 (Week 1): Patient will participate in FGA ?PT Short Term Goal 3 (Week 1): Patient will ambulate >13f with no more than CGA and LRAD ? ?Skilled Therapeutic Interventions/Progress Updates:  ?  Pt received supine in bed, initially declines participation in therapy session as she "already worked with PT earlier today". Educated pt that she worked with OT earlier today and that PT and OT have different focuses and work towards separate goals. Pt agreeable to participate in bed level exercises, declines any OOB mobility or to leave her room as she does not want to don pants. Supine to sit with Supervision. Seated BLE strengthening therex x 15-20 reps: marches, LAQ. Education with patient regarding importance of assessing functional mobility in order to accurately determine a safe d/c date, pt agreeable to short distance ambulation in her room. Sit to stand with Supervision to RW. Ambulation x 15 ft with RW at Supervision level. Ambulation x 15 ft with CGA and HHA. Pt declines any further mobility or participation following short distance gait. Pt returned to bed at Supervision level. Pt missed 35 min of scheduled therapy session due to fatigue and refusal. ? ?Therapy Documentation ?Precautions:  ?Precautions ?Precautions: Fall ?Restrictions ?Weight Bearing Restrictions: No ?General: ?PT Amount of Missed Time (min): 35 Minutes ?PT Missed Treatment Reason: Patient fatigue ? ? ? ? ?Therapy/Group: Individual Therapy ? ? ?TExcell Seltzer PT, DPT, CSRS ? ?09/23/2021, 3:33 PM  ?

## 2021-09-24 LAB — CBC
HCT: 31.8 % — ABNORMAL LOW (ref 36.0–46.0)
Hemoglobin: 9.7 g/dL — ABNORMAL LOW (ref 12.0–15.0)
MCH: 22.6 pg — ABNORMAL LOW (ref 26.0–34.0)
MCHC: 30.5 g/dL (ref 30.0–36.0)
MCV: 74 fL — ABNORMAL LOW (ref 80.0–100.0)
Platelets: 352 10*3/uL (ref 150–400)
RBC: 4.3 MIL/uL (ref 3.87–5.11)
RDW: 31.6 % — ABNORMAL HIGH (ref 11.5–15.5)
WBC: 5.1 10*3/uL (ref 4.0–10.5)
nRBC: 0 % (ref 0.0–0.2)

## 2021-09-24 LAB — BASIC METABOLIC PANEL
Anion gap: 10 (ref 5–15)
BUN: 17 mg/dL (ref 6–20)
CO2: 22 mmol/L (ref 22–32)
Calcium: 9.3 mg/dL (ref 8.9–10.3)
Chloride: 100 mmol/L (ref 98–111)
Creatinine, Ser: 0.71 mg/dL (ref 0.44–1.00)
GFR, Estimated: >60 mL/min (ref >60)
Glucose, Bld: 97 mg/dL (ref 70–99)
Potassium: 4 mmol/L (ref 3.5–5.1)
Sodium: 132 mmol/L — ABNORMAL LOW (ref 135–145)

## 2021-09-24 MED ORDER — TOPIRAMATE 25 MG PO TABS
25.0000 mg | ORAL_TABLET | Freq: Two times a day (BID) | ORAL | Status: DC
  Administered 2021-09-24 – 2021-09-26 (×5): 25 mg via ORAL
  Filled 2021-09-24 (×5): qty 1

## 2021-09-24 MED ORDER — MAGNESIUM GLUCONATE 500 MG PO TABS
250.0000 mg | ORAL_TABLET | Freq: Every day | ORAL | Status: DC
  Administered 2021-09-24 – 2021-09-25 (×2): 250 mg via ORAL
  Filled 2021-09-24 (×2): qty 1

## 2021-09-24 MED ORDER — MELATONIN 3 MG PO TABS
3.0000 mg | ORAL_TABLET | Freq: Every day | ORAL | Status: DC
  Administered 2021-09-24 – 2021-09-25 (×2): 3 mg via ORAL
  Filled 2021-09-24 (×2): qty 1

## 2021-09-24 NOTE — Progress Notes (Signed)
Speech Language Pathology Daily Session Note ? ?Patient Details  ?Name: April Reilly ?MRN: [id number] ?Date of Birth: 12/05/1970 ? ?Today's Date: 09/24/2021 ?SLP Individual Time: 9798-9211 ?SLP Individual Time Calculation (min): 35 min ? ?Short Term Goals: ?Week 1: SLP Short Term Goal 1 (Week 1): Patient will demonstrate selective attention by completing in functional tasks in a mildly distracting environment with min A verbal redirection ?SLP Short Term Goal 2 (Week 1): Patient will demonstrate mildly complex problem solving with min-to-mod A verbal/visual cues ?SLP Short Term Goal 3 (Week 1): Patient will demonstrate awareness and repair errors during functional and novel tasks with min-to-mod A verbal/visual cues ?SLP Short Term Goal 4 (Week 1): Patient will verbalize at least 2 safety precautions associated with new limitation(s) with min-to-mod A cues ?SLP Short Term Goal 5 (Week 1): Patient will utilize compensatory memory strategies to recall novel information with min-to-mod A verbal/visual cues ? ?Skilled Therapeutic Interventions:Skilled ST services focused on cognitive skills. Pt agreeable to ST services, vocal tone appeared flat and pt demonstrated confusion during session. SLP left room in the beginning of the session to gather supplies, Pt then did not recognize SLP when she entered room, stating " I am waiting on my therapist to come back." SLP provided education and pt recalled main task, account balancing mod I. SLP mentioned when the session initially started. Pt was unable to organize and follow novel mildly complex account balancing task, demonstrated reduced mental flexibility, however was able to add/subtract with and without calculator with supervision A verbal cues for errors. Pt supports her 4 daughters will be able to assist with IADLs. Pt supports caring for a wide range of grandchildren, at this time SLP does not recommend pt providing child supervision, pt appeared to slightly agree. Pt  continued to demonstrated deficits of insight.  Pt was left in room with call bell within reach and bed alarm set. SLP recommends to continue skilled services. ? ?   ? ?Pain ?Pain Assessment ?Pain Score: 0-No pain ? ?Therapy/Group: Individual Therapy ? ?Ryken Paschal ?09/24/2021, 2:41 PM ?

## 2021-09-24 NOTE — Progress Notes (Signed)
?                                                       PROGRESS NOTE ? ? ?Subjective/Complaints: ?She is feeling much better and in better mood ?Noted that she had 45 minute PT session and feeling good afterward ?Still has 10/10 headaches at times ? ?ROS: Limited due to cognitive/behavioral  ? ?Objective: ?  ?No results found. ?Recent Labs  ?  09/24/21 ?0541  ?WBC 5.1  ?HGB 9.7*  ?HCT 31.8*  ?PLT 352  ? ? ?Recent Labs  ?  09/24/21 ?0541  ?NA 132*  ?K 4.0  ?CL 100  ?CO2 22  ?GLUCOSE 97  ?BUN 17  ?CREATININE 0.71  ?CALCIUM 9.3  ? ? ? ?Intake/Output Summary (Last 24 hours) at 09/24/2021 0934 ?Last data filed at 09/24/2021 7412 ?Gross per 24 hour  ?Intake 460 ml  ?Output --  ?Net 460 ml  ? ?  ? ?  ? ?Physical Exam: ?Vital Signs ?Blood pressure 104/74, pulse 99, temperature 98.7 ?F (37.1 ?C), temperature source Oral, resp. rate 18, SpO2 100 %. ?Gen: no distress, normal appearing ?HEENT: oral mucosa pink and moist, NCAT ?Cardio: Reg rate ?Chest: normal effort, normal rate of breathing ?Abd: soft, non-distended ?Ext: no edema ?Psych: pleasant, normal affect ?Skin: intact ?Neurological:  ?   Mental Status: She is alert and oriented to person, place, and time.  ?   Comments: fair attention, distracted at times. Limited insight and awareness. Confused. Had difficulty recalling medical history. LUE weakness noted.   ?  ? ? ?Assessment/Plan: ?1. Functional deficits which require 3+ hours per day of interdisciplinary therapy in a comprehensive inpatient rehab setting. ?Physiatrist is providing close team supervision and 24 hour management of active medical problems listed below. ?Physiatrist and rehab team continue to assess barriers to discharge/monitor patient progress toward functional and medical goals ? ?Care Tool: ? ?Bathing ?   ?Body parts bathed by patient: Left arm, Chest, Abdomen, Right arm, Front perineal area, Buttocks, Right upper leg, Left upper leg, Face, Right lower leg, Left lower leg  ?   ?  ?  ?Bathing assist  Assist Level: Supervision/Verbal cueing ?  ?  ?Upper Body Dressing/Undressing ?Upper body dressing   ?What is the patient wearing?: Pull over shirt ?   ?Upper body assist Assist Level: Supervision/Verbal cueing ?   ?Lower Body Dressing/Undressing ?Lower body dressing ? ? ?   ?What is the patient wearing?: Pants ? ?  ? ?Lower body assist Assist for lower body dressing: Supervision/Verbal cueing ?   ? ?Toileting ?Toileting    ?Toileting assist Assist for toileting: Supervision/Verbal cueing ?  ?  ?Transfers ?Chair/bed transfer ? ?Transfers assist ?   ? ?Chair/bed transfer assist level: Supervision/Verbal cueing ?  ?  ?Locomotion ?Ambulation ? ? ?Ambulation assist ? ?   ? ?Assist level: Contact Guard/Touching assist ?Assistive device: Hand held assist ?Max distance: 45'  ? ?Walk 10 feet activity ? ? ?Assist ?   ? ?Assist level: Contact Guard/Touching assist ?Assistive device: No Device  ? ?Walk 50 feet activity ? ? ?Assist   ? ?Assist level: Minimal Assistance - Patient > 75% ?Assistive device: No Device  ? ? ?Walk 150 feet activity ? ? ?Assist   ? ?Assist level: Minimal Assistance - Patient > 75% ?Assistive device: No Device ?  ? ?  Walk 10 feet on uneven surface  ?activity ? ? ?Assist Walk 10 feet on uneven surfaces activity did not occur: Safety/medical concerns ? ? ?  ?   ? ?Wheelchair ? ? ? ? ?Assist Is the patient using a wheelchair?: No ?  ?  ? ?  ?   ? ? ?Wheelchair 50 feet with 2 turns activity ? ? ? ?Assist ? ?  ?  ? ? ?   ? ?Wheelchair 150 feet activity  ? ? ? ?Assist ?   ? ? ?   ? ?Blood pressure 104/74, pulse 99, temperature 98.7 ?F (37.1 ?C), temperature source Oral, resp. rate 18, SpO2 100 %. ? ?Medical Problem List and Plan: ?1. 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            -patient may shower ?            -ELOS/Goals: 10-14 days S ?            -Continue CIR therapies including PT, OT, and SLP  ?2.  Antithrombotics: ?-DVT/anticoagulation:  Pharmaceutical: Other  (comment)--Eliquis ?            -antiplatelet therapy: N/A ?3. Headaches: Continue Fioricet (has been using TID on average).   ?--consider Topamax   ?-add magnesium '250mg'$  HS ?4. Mood:  LCSW to follow for evaluation and support.  ?            -antipsychotic agents: N/a ?5. Neuropsych: This patient is not fully  capable of making decisions on his own behalf. ?6. Skin/Wound Care: Routine pressure relief measures.  ?7. Fluids/Electrolytes/Nutrition: Monitor I/O.   ? -poor po intake---encourage PO ? -on low dose megace currently--hesitate increasing given thrombosis, SST ? 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 Continue to monitor H/H as now on Ac.  ?9. Concern for Seizure: Continue Keppra 500 mg bid.  ?10. Tachycardia: Continue metoprolol bid.  ?            --BP soft in high 90-low 100 range without symptoms.  ?11. Hyperlipidemia: Continue Lipitor.  ?12. Suboptimal potassium:supplement with 20 meq klor ?13.Hypotension:d/c lopressor ?14.Overweight: provide dietary education. D/c megace ? ?LOS: ?5 days ?A FACE TO FACE EVALUATION WAS PERFORMED ? ?Martha Clan P Yasuo Phimmasone ?09/24/2021, 9:34 AM  ? ?  ?

## 2021-09-24 NOTE — Progress Notes (Signed)
Occupational Therapy Session Note ? ?Patient Details  ?Name: April Reilly ?MRN: [id number] ?Date of Birth: 03/21/1971 ? ?Today's Date: 09/24/2021 ?OT Individual Time: 1050-1200 ?OT Individual Time Calculation (min): 70 min  ? ? ?Short Term Goals: ?Week 1:  OT Short Term Goal 1 (Week 1): Pt will be able to reach LUE overhead to 140 to be able to don a shirt more effeciently. ?OT Short Term Goal 2 (Week 1): Pt will demonstrate increased awareness of L side weakness to use safe balance strategies when reaching to her L for items. ?OT Short Term Goal 3 (Week 1): Pt will be able to stand for 30 minutes doing functional activities with S for balance. ? ?Skilled Therapeutic Interventions/Progress Updates:  ?  Pt semi reclined in bed, no c/o pain, fatigue from previous PT session.  Discussed at length pt's desire to discharge home as soon as possible as well as collaborated with 3 daughters via phone throughout session to ensure pt centered dc planning.  Per all three daughters, pt will have 24 hour supervision with daughters rotating staying with pt.  Educated all daughters regarding pts current poor insight into deficits and balance impairments.  Collaborated with daughters to schedule family education tomorrow and they are in agreement.  All three daughters appearing supportive and encouraging of OT recommendations when discussing with their mom.  Pt requesting to use bathroom. Requested grip socks from therapist prior to standing and donned with setup.  Supine to sit, sit to stand and ambulation of ~10 feet to bathroom, toilet transfer, toileting, and ambulation back to EOB all with distant supervision.  Pt requesting hot tea, OT provided setup. Call bell in reach, bed alarm on. ? ?Therapy Documentation ?Precautions:  ?Precautions ?Precautions: Fall ?Restrictions ?Weight Bearing Restrictions: No ? ? ? ?Therapy/Group: Individual Therapy ? ?Caryl Asp Jacqueli Pangallo ?09/24/2021, 1:13 PM ?

## 2021-09-24 NOTE — Progress Notes (Signed)
Patient ID: April Reilly, female   DOB: 03-Jul-1971, 51 y.o.   MRN: [id number] Team feels pt is doing well and an work toward discharge. Daughter's to come in tomorrow for education at 1:00. Will go ahead and order equipment and have delivered to room tomorrow. Family aware pt will require 24/7 supervision due to cognitive deficits. Will fax referral to OP for follow up. ?

## 2021-09-24 NOTE — Progress Notes (Signed)
Physical Therapy Session Note ? ?Patient Details  ?Name: April Reilly ?MRN: [id number] ?Date of Birth: 09-27-1970 ? ?Today's Date: 09/24/2021 ?PT Individual Time: 0932-3557 ?PT Individual Time Calculation (min): 73 min  ? ?Short Term Goals: ?Week 1:  PT Short Term Goal 1 (Week 1): Patient will improve Berg score by >/= 7 points ?PT Short Term Goal 2 (Week 1): Patient will participate in FGA ?PT Short Term Goal 3 (Week 1): Patient will ambulate >14f with no more than CGA and LRAD ? ?Skilled Therapeutic Interventions/Progress Updates:  ?   ?Pt presenting supine in bed to start. All lights off in room. She denies pain but reports fatigue. She reports that she won't work with PT until she get's her breakfast. Noted her tray to be untouched at the bedside. Pt reporting she doesn't want the breakfast tray, but requests ensure's and OJ. Retrieved these items from nurses station and then LPN entering room for morning rx. Retrieved disposable scrub pants per her request and then Pt ultimately agreeable to PT session but very slow to start. Supine<>sit with supervision with minimal use of bed features. Able to sit unsupported at EOB with distant supervision. She donned pants and hospital socks with setupA via figure-4 technique, no assist or cueing needed. Sit<>Stand to RW with supervision and ambulated ~2020fto main rehab gym with CGA and RW. In rehab gym, completed the following functional outcome measures to assess falls risk ? ?FGA - 13/30, no AD ?*scores <22/30 indicate increased falls risk ? ?5xSTS - 15s - from lowered mat table ?*scores >15 seconds indicate increased falls risk ? ?TUG - 13s, no AD, CGA ?*scores >13.5 seconds indicate increased falls risk ? ?6MWT - 58515fo AD, CGA ? ?See below for details regarding FGA results. She had most difficulty with narrow BOS, walking backwards, walking with head turns.  ? ?We discussed DC planning and home safety throughout session, especially during rest breaks. Plan is to  return to her single story home with 2-3 STE, having multiple family members piece together 24/7 care. She would benefit from continued education on stroke recovery, deficits related to her CVA (L inattention, safety awareness, balance, cognition, etc), as well as guidance on return to work and driving.  ? ?Noted towards end of session while walking back to her room, she required minA while using no AD with increased L drifting and difficulty maintaining straight path. Gait speed also more decreased than earlier parts of session. Contribute worsening gait deviations 2/2 fatigue.  ? ?Pt concluded session supine in bed with bed alarm on, all needs within reach. Informed of upcoming therapy schedule.  ? ?Therapy Documentation ?Precautions:  ?Precautions ?Precautions: Fall ?Restrictions ?Weight Bearing Restrictions: No ?General: ?  ?Balance: ?Balance ?Balance Assessed: Yes ?Standardized Balance Assessment ?Standardized Balance Assessment: Timed Up and Go Test;Functional Gait Assessment ? ?Timed Up and Go Test ?TUG: Normal TUG ?Normal TUG (seconds): 13 (no AD) ? ?Functional Gait  Assessment ?Gait assessed : Yes ?Gait Level Surface: Walks 20 ft in less than 7 sec but greater than 5.5 sec, uses assistive device, slower speed, mild gait deviations, or deviates 6-10 in outside of the 12 in walkway width. ?Change in Gait Speed: Able to change speed, demonstrates mild gait deviations, deviates 6-10 in outside of the 12 in walkway width, or no gait deviations, unable to achieve a major change in velocity, or uses a change in velocity, or uses an assistive device. ?Gait with Horizontal Head Turns: Performs head turns with moderate changes in gait  velocity, slows down, deviates 10-15 in outside 12 in walkway width but recovers, can continue to walk. ?Gait with Vertical Head Turns: Performs task with slight change in gait velocity (eg, minor disruption to smooth gait path), deviates 6 - 10 in outside 12 in walkway width or uses  assistive device ?Gait and Pivot Turn: Pivot turns safely in greater than 3 sec and stops with no loss of balance, or pivot turns safely within 3 sec and stops with mild imbalance, requires small steps to catch balance. ?Step Over Obstacle: Is able to step over one shoe box (4.5 in total height) without changing gait speed. No evidence of imbalance. ?Gait with Narrow Base of Support: Ambulates less than 4 steps heel to toe or cannot perform without assistance. ?Gait with Eyes Closed: Cannot walk 20 ft without assistance, severe gait deviations or imbalance, deviates greater than 15 in outside 12 in walkway width or will not attempt task. ?Ambulating Backwards: Walks 20 ft, slow speed, abnormal gait pattern, evidence for imbalance, deviates 10-15 in outside 12 in walkway width. ?Steps: Alternating feet, must use rail. ?Total Score: 13/30 ?FGA comment:: no AD. LOB with Narrow BOS/eyes closed, and horizontal head turns ? ?Therapy/Group: Individual Therapy ? ?Alger Simons PT ?09/24/2021, 7:36 AM  ?

## 2021-09-25 MED ORDER — METOPROLOL TARTRATE 12.5 MG HALF TABLET
12.5000 mg | ORAL_TABLET | Freq: Every day | ORAL | Status: DC
  Administered 2021-09-25 – 2021-09-26 (×2): 12.5 mg via ORAL
  Filled 2021-09-25 (×2): qty 1

## 2021-09-25 NOTE — Progress Notes (Signed)
Physical Therapy Session Note ? ?Patient Details  ?Name: April Reilly ?MRN: [id number] ?Date of Birth: 1971-04-09 ? ?Today's Date: 09/25/2021 ?PT Individual Time: 6295-2841 ?PT Individual Time Calculation (min): 58 min  ? ?Short Term Goals: ?Week 1:  PT Short Term Goal 1 (Week 1): Patient will improve Berg score by >/= 7 points ?PT Short Term Goal 2 (Week 1): Patient will participate in FGA ?PT Short Term Goal 3 (Week 1): Patient will ambulate >152f with no more than CGA and LRAD ? ?Skilled Therapeutic Interventions/Progress Updates:  ?   ?Pt received supine in bed. Focus of session to complete family training and education to prepare for upcoming DC. Family confirms 24/7 S/A with rotating schedule b/w daughters. Pt agreeable to PT tx but this required convincing as pt was adament that she was going home, regardless if therapy completed family training/education. She reported she would only do "in-room" therapy and despite education on needing to review stairs, car transfer, and long distance gait, she would continue to refuse. Daughter's assisting with encouragement and LPN entering room for rx. LPN able to assist in making pt agreeable for therapy session. Lengthy conversation regarding PT goals, pt's current mobility status, her deficits related to her CVA (L inattention, poor insight, weakness), falls risk, abstaining from driving and returning to work until MD clearance, and recommendation for 24/7 supervision. Daughters and patient verbalized understanding. Pt completing supine<>sit indep without bed features. Her RW was delivered to her room and this was adjusted RW to fit. Sit<>stand to RW supervision assist. Ambulated hallway distances, ~3042f with RW and supervision assist. Completed stairs, going up/down x12 steps with CGA and no hand rails to simulate her home entrance (she has 2 STE no rails but has a wall she uses for balance)." She required cueing for slowing down her activity as she was somewhat  rushed while doing the stairs. Daughters observed this and did well with instructing patient to slow down. On her way to ortho rehab gym to practice car transfer, she reported need to void. She was incontinent of bladder as she was unable to hold until we reached the bathroom across from rehab gym. She was able to manage clothes and change with setupA and was continent of further bladder was sitting on toilet. Daughters observed this as well and assisted her with cleaning. Pt then completed the car transfer using RW with supervision assist, verbal cueing for pacing activity and technique to reduce her falls risk. She ambulated back to her room, ~30066fwith supervision and RW - decreased gait speed compared to earlier part of session and spent time educating daughter's on endurance deficits and importance of limiting gait distances <300f42f reduce falls risk. Pt concluded session supine in bed with bed alarm on and family at the bedside. Informed them all of upcoming therapy schedule and encouraged daughter's to stay for remaining family training sessions with OT/SLP. All needs met at conclusion of session. ? ?Therapy Documentation ?Precautions:  ?Precautions ?Precautions: Fall ?Restrictions ?Weight Bearing Restrictions: No ?General: ?  ? ?Therapy/Group: Individual Therapy ? ?April Reilly P April Reilly ?09/25/2021, 7:28 AM  ?

## 2021-09-25 NOTE — Discharge Summary (Signed)
Physical Therapy Discharge Summary ? ?Patient Details  ?Name: April Reilly ?MRN: [id number] ?Date of Birth: 1971/05/28 ? ?Patient has met 9 of 9 long term goals due to improved activity tolerance, improved balance, improved postural control, increased strength, ability to compensate for deficits, improved attention, and improved awareness.  Patient to discharge at an ambulatory level Supervision.   Patient's care partner is independent to provide the necessary physical and cognitive assistance at discharge. Family training completed on 3/7 with both daughter's (see progress note on that date for details).  ? ?Reasons goals not met: n/a ? ?Recommendation:  ?Patient will benefit from ongoing skilled PT services in outpatient setting to continue to advance safe functional mobility, address ongoing impairments in dynamic balance, endurance, general strengthening, and minimize fall risk. ? ?Equipment: ?RW ? ?Reasons for discharge: treatment goals met and discharge from hospital ? ?Patient/family agrees with progress made and goals achieved: Yes ? ?PT Discharge ?Precautions/Restrictions ?Precautions ?Precautions: Fall ?Restrictions ?Weight Bearing Restrictions: No ?Pain ?Pain Assessment ?Pain Scale: 0-10 ?Pain Score: 0-No pain ?Pain Interference ?Pain Interference ?Pain Effect on Sleep: 1. Rarely or not at all ?Pain Interference with Therapy Activities: 1. Rarely or not at all ?Pain Interference with Day-to-Day Activities: 1. Rarely or not at all ?Vision/Perception  ?Vision - History ?Ability to See in Adequate Light: 0 Adequate ?Perception ?Perception: Within Functional Limits ?Praxis ?Praxis: Intact  ?Cognition ?Overall Cognitive Status: Impaired/Different from baseline ?Arousal/Alertness: Awake/alert ?Orientation Level: Oriented X4 ?Year: 2023 ?Month: March ?Day of Week: Correct ?Attention: Sustained ?Focused Attention: Appears intact ?Sustained Attention: Impaired ?Sustained Attention Impairment: Functional  basic ?Selective Attention: Impaired ?Selective Attention Impairment: Verbal basic;Functional basic ?Memory: Impaired ?Memory Impairment: Storage deficit;Retrieval deficit ?Awareness: Impaired ?Awareness Impairment: Intellectual impairment ?Problem Solving: Impaired ?Problem Solving Impairment: Verbal basic;Functional basic ?Executive Function: Self Monitoring;Self Correcting ?Self Monitoring: Impaired ?Self Monitoring Impairment: Verbal basic;Functional basic ?Self Correcting: Impaired ?Self Correcting Impairment: Verbal basic;Functional basic ?Safety/Judgment: Impaired ?Sensation ?Sensation ?Light Touch: Appears Intact ?Hot/Cold: Appears Intact ?Proprioception: Appears Intact ?Stereognosis: Appears Intact ?Coordination ?Gross Motor Movements are Fluid and Coordinated: No ?Coordination and Movement Description: decreased due to L hemiparesis; improved since date of evaluation ?Motor  ?Motor ?Motor: Hemiplegia ?Motor - Discharge Observations: mild L hemi  ?Mobility ?Bed Mobility ?Bed Mobility: Rolling Right;Rolling Left;Supine to Sit;Sit to Supine ?Rolling Right: Independent ?Rolling Left: Independent ?Supine to Sit: Independent ?Sit to Supine: Independent ?Transfers ?Transfers: Stand to Sit;Sit to Stand;Stand Pivot Transfers ?Sit to Stand: Supervision/Verbal cueing ?Stand to Sit: Supervision/Verbal cueing ?Stand Pivot Transfers: Supervision/Verbal cueing ?Stand Pivot Transfer Details: Verbal cues for precautions/safety;Verbal cues for safe use of DME/AE ?Transfer (Assistive device): Rolling walker ?Locomotion  ?Gait ?Ambulation: Yes ?Gait Assistance: Supervision/Verbal cueing ?Gait Distance (Feet): 300 Feet ?Assistive device: Rolling walker ?Gait Assistance Details: Verbal cues for safe use of DME/AE;Verbal cues for precautions/safety;Verbal cues for gait pattern ?Gait ?Gait: Yes ?Gait Pattern: Impaired ?Gait Pattern: Step-through pattern;Trunk flexed;Narrow base of support (postural sway; slight veering L) ?Gait  velocity: decreased ?Stairs / Additional Locomotion ?Stairs: Yes ?Stairs Assistance: Contact Guard/Touching assist ?Stair Management Technique: No rails ?Number of Stairs: 12 ?Height of Stairs: 6 ?Wheelchair Mobility ?Wheelchair Mobility: No  ?Trunk/Postural Assessment  ?Cervical Assessment ?Cervical Assessment: Within Functional Limits ?Thoracic Assessment ?Thoracic Assessment: Within Functional Limits ?Lumbar Assessment ?Lumbar Assessment: Within Functional Limits ?Postural Control ?Postural Control: Within Functional Limits  ?Balance ?Balance ?Balance Assessed: Yes ?Standardized Balance Assessment ?Standardized Balance Assessment: Timed Up and Go Test;Functional Gait Assessment ?Timed Up and Go Test ?TUG: Normal TUG ?Normal TUG (seconds): 13 ?Static Sitting  Balance ?Static Sitting - Balance Support: Feet supported ?Static Sitting - Level of Assistance: 7: Independent ?Dynamic Sitting Balance ?Dynamic Sitting - Balance Support: Feet supported ?Dynamic Sitting - Level of Assistance: 6: Modified independent (Device/Increase time) ?Static Standing Balance ?Static Standing - Balance Support: Bilateral upper extremity supported ?Static Standing - Level of Assistance: 5: Stand by assistance ?Dynamic Standing Balance ?Dynamic Standing - Balance Support: During functional activity;Bilateral upper extremity supported ?Dynamic Standing - Level of Assistance: 5: Stand by assistance ?Functional Gait  Assessment ?Gait assessed : Yes ?Gait Level Surface: Walks 20 ft in less than 7 sec but greater than 5.5 sec, uses assistive device, slower speed, mild gait deviations, or deviates 6-10 in outside of the 12 in walkway width. ?Change in Gait Speed: Able to change speed, demonstrates mild gait deviations, deviates 6-10 in outside of the 12 in walkway width, or no gait deviations, unable to achieve a major change in velocity, or uses a change in velocity, or uses an assistive device. ?Gait with Horizontal Head Turns: Performs head  turns with moderate changes in gait velocity, slows down, deviates 10-15 in outside 12 in walkway width but recovers, can continue to walk. ?Gait with Vertical Head Turns: Performs task with slight change in gait velocity (eg, minor disruption to smooth gait path), deviates 6 - 10 in outside 12 in walkway width or uses assistive device ?Gait and Pivot Turn: Pivot turns safely in greater than 3 sec and stops with no loss of balance, or pivot turns safely within 3 sec and stops with mild imbalance, requires small steps to catch balance. ?Step Over Obstacle: Is able to step over one shoe box (4.5 in total height) without changing gait speed. No evidence of imbalance. ?Gait with Narrow Base of Support: Ambulates less than 4 steps heel to toe or cannot perform without assistance. ?Gait with Eyes Closed: Cannot walk 20 ft without assistance, severe gait deviations or imbalance, deviates greater than 15 in outside 12 in walkway width or will not attempt task. ?Ambulating Backwards: Walks 20 ft, slow speed, abnormal gait pattern, evidence for imbalance, deviates 10-15 in outside 12 in walkway width. ?Steps: Alternating feet, must use rail. ?Total Score: 13 ?FGA comment:: no AD. LOB with Narrow BOS/eyes closed, and horizontal head turns ?Extremity Assessment  ?  ?  ?RLE Assessment ?RLE Assessment: Within Functional Limits ?LLE Assessment ?LLE Assessment: Within Functional Limits ?General Strength Comments: Grossly 4-/5 ? ? ? ?Alger Simons PT ?09/25/2021, 2:55 PM ?

## 2021-09-25 NOTE — Plan of Care (Signed)
?  Problem: RH Problem Solving ?Goal: LTG Patient will demonstrate problem solving for (SLP) ?Description: LTG:  Patient will demonstrate problem solving for basic/complex daily situations with cues  (SLP) ?Outcome: Completed/Met ?  ?Problem: RH Memory ?Goal: LTG Patient will use memory compensatory aids to (SLP) ?Description: LTG:  Patient will use memory compensatory aids to recall biographical/new, daily complex information with cues (SLP) ?Outcome: Completed/Met ?  ?Problem: RH Attention ?Goal: LTG Patient will demonstrate this level of attention during functional activites (SLP) ?Description: LTG:  Patient will will demonstrate this level of attention during functional activites (SLP) ?Outcome: Completed/Met ?  ?Problem: RH Awareness ?Goal: LTG: Patient will demonstrate awareness during functional activites type of (SLP) ?Description: LTG: Patient will demonstrate awareness during functional activites type of (SLP) ?Outcome: Completed/Met ?  ?

## 2021-09-25 NOTE — Progress Notes (Signed)
Inpatient Rehabilitation Care Coordinator ?Discharge Note  ? ?Patient Details  ?Name: April Reilly ?MRN: [id number] ?Date of Birth: [birth year] ? ? ?Discharge location: [place] 24/7 ? ?Length of Stay: 7 DAYS ? ?Discharge activity level: SUPERVISION-CGA LEVEL ? ?Home/community participation: ACTIVE ? ?Patient response CB:SWHQPR Literacy - How often do you need to have someone help you when you read instructions, pamphlets, or other written material from your doctor or pharmacy?: Never ? ?Patient response FF:MBWGYK Isolation - How often do you feel lonely or isolated from those around you?: Never ? ?Services provided included: MD, RD, PT, OT, SLP, RN, CM, Pharmacy, SW ? ?Financial Services:  ?Charity fundraiser Utilized: Private Insurance ?[facility] [facility] ? ?Choices offered to/list presented to: PT AND DAUGHTER ? ?Follow-up services arranged:  ?Outpatient, DME, Patient/Family has no preference for HH/DME agencies ?   ?Outpatient Servicies: [facility] PT, OT, SP WILL CALL DAUGHTER TO ARRANGE APPOINTMENTS ?DME : [facility] 3 IN 1 TUB [facility] ?  ? ?Patient response to transportation need: ?Is the patient able to respond to transportation needs?: Yes ?In the past 12 months, has lack of transportation kept you from medical appointments or from getting medications?: No ?In the past 12 months, has lack of transportation kept you from meetings, work, or from getting things needed for daily living?: No ? ? ? ?Comments (or additional information): DAUGHTER'S WERE HERE YESTERDAY FOR EDUCATION AND SAW THE ASSIST PT REQUIRES. MADE AWARE WILL NEED 24/7 SUPERVISION AT DC ? ?Patient/Family verbalized understanding of follow-up arrangements:  Yes ? ?Individual responsible for coordination of the follow-up plan: April Reilly-DAUGHTER [phone] ? ?Confirmed correct DME delivered: April Reilly 09/25/2021   ? ?April Reilly ?

## 2021-09-25 NOTE — Progress Notes (Signed)
?                                                       PROGRESS NOTE ? ? ?Subjective/Complaints: ?She is excited for discharge tomorrow ?Headaches have improved ?She slept well last night ?Her daughters will be in for caregiver training today ? ?ROS: Headaches improved ? ?Objective: ?  ?No results found. ?Recent Labs  ?  09/24/21 ?0541  ?WBC 5.1  ?HGB 9.7*  ?HCT 31.8*  ?PLT 352  ? ? ?Recent Labs  ?  09/24/21 ?0541  ?NA 132*  ?K 4.0  ?CL 100  ?CO2 22  ?GLUCOSE 97  ?BUN 17  ?CREATININE 0.71  ?CALCIUM 9.3  ? ? ? ?Intake/Output Summary (Last 24 hours) at 09/25/2021 1135 ?Last data filed at 09/25/2021 1118 ?Gross per 24 hour  ?Intake 749 ml  ?Output --  ?Net 749 ml  ? ?  ? ?  ? ?Physical Exam: ?Vital Signs ?Blood pressure 101/64, pulse (!) 104, temperature 98.3 ?F (36.8 ?C), temperature source Oral, resp. rate 16, SpO2 98 %. ?Gen: no distress, normal appearing ?HEENT: oral mucosa pink and moist, NCAT ?Cardio: Tachycardic ?Chest: normal effort, normal rate of breathing ?Abd: soft, non-distended ?Ext: no edema ?Psych: pleasant, normal affect ?Skin: intact ?Neurological:  ?   Mental Status: She is alert and oriented to person, place, and time.  ?   Comments: fair attention, distracted at times. Limited insight and awareness. Confused. Had difficulty recalling medical history. LUE weakness noted.   ?  ? ? ?Assessment/Plan: ?1. Functional deficits which require 3+ hours per day of interdisciplinary therapy in a comprehensive inpatient rehab setting. ?Physiatrist is providing close team supervision and 24 hour management of active medical problems listed below. ?Physiatrist and rehab team continue to assess barriers to discharge/monitor patient progress toward functional and medical goals ? ?Care Tool: ? ?Bathing ?   ?Body parts bathed by patient: Left arm, Chest, Abdomen, Right arm, Front perineal area, Buttocks, Right upper leg, Left upper leg, Face, Right lower leg, Left lower leg  ?   ?  ?  ?Bathing assist Assist Level:  Supervision/Verbal cueing ?  ?  ?Upper Body Dressing/Undressing ?Upper body dressing   ?What is the patient wearing?: Pull over shirt ?   ?Upper body assist Assist Level: Supervision/Verbal cueing ?   ?Lower Body Dressing/Undressing ?Lower body dressing ? ? ?   ?What is the patient wearing?: Pants ? ?  ? ?Lower body assist Assist for lower body dressing: Supervision/Verbal cueing ?   ? ?Toileting ?Toileting    ?Toileting assist Assist for toileting: Supervision/Verbal cueing ?  ?  ?Transfers ?Chair/bed transfer ? ?Transfers assist ?   ? ?Chair/bed transfer assist level: Supervision/Verbal cueing ?  ?  ?Locomotion ?Ambulation ? ? ?Ambulation assist ? ?   ? ?Assist level: Contact Guard/Touching assist ?Assistive device: Hand held assist ?Max distance: 79'  ? ?Walk 10 feet activity ? ? ?Assist ?   ? ?Assist level: Contact Guard/Touching assist ?Assistive device: No Device  ? ?Walk 50 feet activity ? ? ?Assist   ? ?Assist level: Minimal Assistance - Patient > 75% ?Assistive device: No Device  ? ? ?Walk 150 feet activity ? ? ?Assist   ? ?Assist level: Minimal Assistance - Patient > 75% ?Assistive device: No Device ?  ? ?Walk 10 feet on  uneven surface  ?activity ? ? ?Assist Walk 10 feet on uneven surfaces activity did not occur: Safety/medical concerns ? ? ?  ?   ? ?Wheelchair ? ? ? ? ?Assist Is the patient using a wheelchair?: No ?  ?  ? ?  ?   ? ? ?Wheelchair 50 feet with 2 turns activity ? ? ? ?Assist ? ?  ?  ? ? ?   ? ?Wheelchair 150 feet activity  ? ? ? ?Assist ?   ? ? ?   ? ?Blood pressure 101/64, pulse (!) 104, temperature 98.3 ?F (36.8 ?C), temperature source Oral, resp. rate 16, SpO2 98 %. ? ?Medical Problem List and Plan: ?1. 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            -patient may shower ?            -ELOS/Goals: 10-14 days S ?            -Continue CIR therapies including PT, OT, and SLP  ? HFU scheduled ?2.  Antithrombotics: ?-DVT/anticoagulation:  Pharmaceutical: Other  (comment)--Eliquis ?            -antiplatelet therapy: N/A ?3. Headaches: Continue Fioricet (has been using TID on average).   ?--continue Topamax   ?-add magnesium '250mg'$  HS ?4. Insomnia: replace topamax with trazodone.  ?5. Neuropsych: This patient is not fully  capable of making decisions on his own behalf. ?6. Skin/Wound Care: Routine pressure relief measures.  ?7. Fluids/Electrolytes/Nutrition: Monitor I/O.   ? -poor po intake---encourage PO ? -on low dose megace currently--hesitate increasing given thrombosis, SST ? 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 Continue to monitor H/H as now on Ac.  ?9. Concern for Seizure: Continue Keppra 500 mg bid.  ?10. Tachycardia: add back lopressor 12.'5mg'$  daily.  ?11. Hyperlipidemia: Continue Lipitor.  ?12. Suboptimal potassium:supplement with 20 meq klor ?13.Hypotension:d/c lopressor ?14.Overweight: provide dietary education. D/c megace ? ?LOS: ?6 days ?A FACE TO FACE EVALUATION WAS PERFORMED ? ?Martha Clan P Jeremy Ditullio ?09/25/2021, 11:35 AM  ? ?  ?

## 2021-09-25 NOTE — Progress Notes (Signed)
Occupational Therapy Session Note ? ?Patient Details  ?Name: April Reilly ?MRN: [id number] ?Date of Birth: 1971-07-10 ? ?Today's Date: 09/25/2021 ?OT Individual Time: 0630-1601 ?OT Individual Time Calculation (min): 43 min  ? ? ?Short Term Goals: ?Week 1:  OT Short Term Goal 1 (Week 1): Pt will be able to reach LUE overhead to 140 to be able to don a shirt more effeciently. ?OT Short Term Goal 2 (Week 1): Pt will demonstrate increased awareness of L side weakness to use safe balance strategies when reaching to her L for items. ?OT Short Term Goal 3 (Week 1): Pt will be able to stand for 30 minutes doing functional activities with S for balance. ? ?Skilled Therapeutic Interventions/Progress Updates:  ?Pt greeted supine in bed  agreeable to OT intervention. Family not present for family education session and pt declined leaving room during session.  Session focus on L visual scanning, functional mobility, dynamic standing balance and decreasing overall caregiver burden.Pt completed line bisection test to assess for L inattention and to work on visual scanning tasks. Pt completed task with supervision pt did miss one line in middle of paper but able to notice with increased time provided, no R vs L preference noted.  ?Pt also able to scan L and R to read items on menu with no cues needed.  ?Pt able to ambulate to sink with Rw with supervision.  ?Pt Stood for 6 mins and 48 secs to complete dynamic balance task at mirror such as engaging in tic-tac-toe to work on visual scanning, pt was noted to miss winning on L side, needing MOD cues to orient to L side of mirror. ?Discussed education to be relayed to pts daughters concerning pts current level of function and recommendation to have supervision assist with all mobility and ADL tasks.  Pt left supine in bed with all needs within reach.  ? ?Therapy Documentation ?Precautions:  ?Precautions ?Precautions: Fall ?Restrictions ?Weight Bearing Restrictions: No ? ?Pain: no pain  reported during session  ? ? ? ?Therapy/Group: Individual Therapy ? ?[name] ?09/25/2021, 3:55 PM ?

## 2021-09-25 NOTE — Progress Notes (Signed)
Speech Language Pathology Discharge Summary ? ?Patient Details  ?Name: April Reilly ?MRN: [id number] ?Date of Birth: August 23, 1970 ? ?Today's Date: 09/25/2021 ?SLP Individual Time: 9906-8934 ?SLP Individual Time Calculation (min): 40 min ? ?Skilled Therapeutic Interventions:  Skilled ST treatment focused on family education to prepare for upcoming d/c. SLP provided both verbal and written education on cognitive-communication deficits in the areas of awareness of deficits, problem solving, functional recall, and attention. Discussed recommendations and importance of 24 hour supervision and family to supervise/manage all iADLs at this time due to cognitive impairments. SLP informed pt and family of recommendations for follow-up speech therapy as outpatient. Pt and family agreed. All questions were addressed. No further speech needs identified at this time.  Patient was left in bed with alarm activated and immediate needs within reach at end of session. ? ?Patient has met 4 of 4 long term goals.  Patient to discharge at overall Supervision;Min level.  ?Reasons goals not met: All goals met  ? ?Clinical Impression/Discharge Summary: Patient has made functional gains and has met 4 of 4 long-term goals this admission due to improved cognitive-linguistic skills in the areas of problem solving, memory, attention, and intellectual awareness. Patient is currently an overall supervision-to-min A for cognitive tasks. Pt presents with inconsistent awareness of deficits which is viewed as most significant concern to safety at this time. Patient/family education was competed where it was discussed the importance of 24 hour supervision and family to supervise/manage all iADLs at this time due to current cognitive impairments. Family verbalized understanding and agreement.  ?All education is complete and patient to discharge at overall sup-to-min A level. Patient's care partner is independent to provide the necessary physical and cognitive  assistance at discharge. Patient would benefit from continued SLP services in an outpatient setting to maximize cognitive function and functional independence.   ? ?Care Partner:  ?Caregiver Able to Provide Assistance: Yes  ?Type of Caregiver Assistance: Physical;Cognitive ? ?Recommendation:  ?24 hour supervision/assistance;Outpatient SLP  ?Rationale for SLP Follow Up: Maximize cognitive function and independence;Reduce caregiver burden  ? ?Equipment: None  ? ?Reasons for discharge: Treatment goals met;Discharged from hospital  ? ?Patient/Family Agrees with Progress Made and Goals Achieved: Yes  ? ? ?April Reilly ?09/25/2021, 3:59 PM ? ?

## 2021-09-26 ENCOUNTER — Other Ambulatory Visit (HOSPITAL_COMMUNITY): Payer: Self-pay

## 2021-09-26 DIAGNOSIS — E871 Hypo-osmolality and hyponatremia: Secondary | ICD-10-CM

## 2021-09-26 DIAGNOSIS — N938 Other specified abnormal uterine and vaginal bleeding: Secondary | ICD-10-CM

## 2021-09-26 DIAGNOSIS — G4459 Other complicated headache syndrome: Secondary | ICD-10-CM

## 2021-09-26 DIAGNOSIS — R7989 Other specified abnormal findings of blood chemistry: Secondary | ICD-10-CM

## 2021-09-26 MED ORDER — SENNOSIDES-DOCUSATE SODIUM 8.6-50 MG PO TABS
2.0000 | ORAL_TABLET | Freq: Every day | ORAL | 0 refills | Status: DC
  Filled 2021-09-26: qty 60, 30d supply, fill #0

## 2021-09-26 MED ORDER — MIRTAZAPINE 15 MG PO TABS
15.0000 mg | ORAL_TABLET | Freq: Every day | ORAL | 0 refills | Status: DC
  Filled 2021-09-26: qty 30, 30d supply, fill #0

## 2021-09-26 MED ORDER — PAROXETINE HCL 10 MG PO TABS
10.0000 mg | ORAL_TABLET | Freq: Every day | ORAL | 0 refills | Status: DC
  Filled 2021-09-26: qty 30, 30d supply, fill #0

## 2021-09-26 MED ORDER — TRAMADOL HCL 50 MG PO TABS
25.0000 mg | ORAL_TABLET | Freq: Two times a day (BID) | ORAL | 0 refills | Status: DC | PRN
  Filled 2021-09-26: qty 7, 7d supply, fill #0

## 2021-09-26 MED ORDER — TOPIRAMATE 25 MG PO TABS
25.0000 mg | ORAL_TABLET | Freq: Two times a day (BID) | ORAL | 0 refills | Status: DC
Start: 2021-09-26 — End: 2021-10-10
  Filled 2021-09-26: qty 60, 30d supply, fill #0

## 2021-09-26 MED ORDER — ATORVASTATIN CALCIUM 40 MG PO TABS
40.0000 mg | ORAL_TABLET | Freq: Every day | ORAL | 0 refills | Status: DC
  Filled 2021-09-26: qty 30, 30d supply, fill #0

## 2021-09-26 MED ORDER — LEVETIRACETAM 500 MG PO TABS
500.0000 mg | ORAL_TABLET | Freq: Two times a day (BID) | ORAL | 0 refills | Status: DC
  Filled 2021-09-26: qty 60, 30d supply, fill #0

## 2021-09-26 MED ORDER — APIXABAN 5 MG PO TABS
5.0000 mg | ORAL_TABLET | Freq: Two times a day (BID) | ORAL | 0 refills | Status: DC
  Filled 2021-09-26: qty 60, 30d supply, fill #0

## 2021-09-26 MED ORDER — METOPROLOL TARTRATE 25 MG PO TABS
12.5000 mg | ORAL_TABLET | Freq: Two times a day (BID) | ORAL | 0 refills | Status: DC
Start: 2021-09-26 — End: 2021-10-19
  Filled 2021-09-26: qty 30, 30d supply, fill #0

## 2021-09-26 MED ORDER — MELATONIN 3 MG PO TABS
3.0000 mg | ORAL_TABLET | Freq: Every day | ORAL | 0 refills | Status: DC
  Filled 2021-09-26: qty 30, 30d supply, fill #0

## 2021-09-26 MED ORDER — BUTALBITAL-APAP-CAFFEINE 50-325-40 MG PO TABS
1.0000 | ORAL_TABLET | Freq: Two times a day (BID) | ORAL | 0 refills | Status: DC | PRN
  Filled 2021-09-26: qty 14, 7d supply, fill #0

## 2021-09-26 MED ORDER — MAGNESIUM OXIDE 400 MG PO TABS
200.0000 mg | ORAL_TABLET | Freq: Every day | ORAL | 0 refills | Status: DC
  Filled 2021-09-26: qty 30, 60d supply, fill #0

## 2021-09-26 NOTE — Patient Care Conference (Signed)
Inpatient RehabilitationTeam Conference and Plan of Care Update ?Date: 09/26/2021   Time: 11:02 AM  ? ? ?Patient Name: April Reilly      ?Medical Record Number: [id number]  ?Date of Birth: [birth year] ?Sex: Female         ?Room/Bed: 4U98J/1B14N-82 ?Payor Info: Payor: [employer] / Plan:  [facility] / Product Type: *No Product type* /   ? ?Admit Date/Time:  09/19/2021  5:44 PM ? ?Primary Diagnosis:  Cerebral venous sinus thrombosis ? ?Hospital Problems: Principal Problem: ?  Cerebral venous sinus thrombosis ?Active Problems: ?  Seizure ([place]) ?  Iron deficiency anemia ?  Other complicated headache syndrome ?  Hyponatremia ?  Abnormal LFTs ? ? ? ?Expected Discharge Date:   ? ?Team Members Present: ?  ? ?   Current Status/Progress Goal Weekly Team Focus  ?Bowel/Bladder ? ? Pt is continent of bowel/bladder  Pt will remain continent of bowel/bladder  Will assess qshift and PRN   ?Swallow/Nutrition/ Hydration ? ?           ?ADL's ? ?           ?Mobility ? ? goal level - indep bed mobility, supervision transfers, supervision gait with RW  supervision  Family training, safety awareness, gait training, dynamic balance   ?Communication ? ?           ?Safety/Cognition/ Behavioral Observations ?           ?Pain ? ? Pt is currently pain free  Pt will remain pain free  Will assess qshift and PRN   ?Skin ? ? Pt's skin is intact  Pt's skin will remain intact  Will assess qshift and PRN   ? ? ?Discharge Planning:  ?HOme with foyur daughter's rotating her care, can provide 24/7. Will do family education today 3/7 and hopefully DC today or tomorrow   ?Team Discussion: ?Patient anxious to go home. Family education in prep for discharge.  ?Patient on target to meet rehab goals: ?Patient leaving with little therapy and unable to adjust goals before discharge. ? ?*See Care Plan and progress notes for long and short-term goals.  ? ?Revisions to Treatment Plan:  ?N/A ?  ?Teaching Needs: ?Safety, medications, dietary modifications,  transfers, etc.  ?Current Barriers to Discharge: ?Decreased caregiver support ? ?Possible Resolutions to Barriers: ?Family education ?OP follow up services ?DME: TTB, RW and BSC  ? ? Medical Summary ?  ?  ?  ?  ? ? ?  ? ? ?I attest that I was present, lead the team conference, and concur with the assessment and plan of the team. ? ? ?Dorien Chihuahua B ?09/26/2021, 11:02 AM  ? ? ? ? ? ? ?

## 2021-09-26 NOTE — Discharge Summary (Signed)
Physician Discharge Summary  Patient ID: April Reilly MRN: [id number] DOB/AGE: 1970-12-08 51 y.o.  Admit date: 09/19/2021 Discharge date: 09/26/2021  Discharge Diagnoses:  Principal Problem:   Cerebral venous sinus thrombosis Active Problems:   Seizure ([facility])   Iron deficiency anemia   Other complicated headache syndrome   Hyponatremia   Abnormal LFTs   Dysfunctional uterine bleeding   Discharged Condition: stable  Significant Diagnostic Studies: Labs:  Basic Metabolic Panel: BMP Latest Ref Rng & Units 09/24/2021 09/20/2021 09/16/2021  Glucose 70 - 99 mg/dL 97 104(H) 117(H)  BUN 6 - 20 mg/dL '17 11 10  '$ Creatinine 0.44 - 1.00 mg/dL 0.71 0.73 0.65  Sodium 135 - 145 mmol/L 132(L) 141 142  Potassium 3.5 - 5.1 mmol/L 4.0 3.8 3.5  Chloride 98 - 111 mmol/L 100 108 110  CO2 22 - 32 mmol/L '22 22 23  '$ Calcium 8.9 - 10.3 mg/dL 9.3 9.4 9.1     CBC: CBC Latest Ref Rng & Units 09/24/2021 09/20/2021 09/18/2021  WBC 4.0 - 10.5 K/uL 5.1 6.6 8.2  Hemoglobin 12.0 - 15.0 g/dL 9.7(L) 8.5(L) 7.7(L)  Hematocrit 36.0 - 46.0 % 31.8(L) 29.7(L) 26.0(L)  Platelets 150 - 400 K/uL 352 318 286     CBG: No results for input(s): GLUCAP in the last 168 hours.  Brief HPI:   April Reilly is a 51 y.o. female with history of SS trait, uterine fibroid, abnormal vaginal bleeding x4 months who was admitted on 09/10/2021 with 3-day history of malaise, fall and progressive somnolence.  She was found to have RLL aspiration pneumonia as well as IPH due to right hemorrhagic infarct with edema and right frontal, parietal, occipital and temporal lobes and 9 mm right to left midline shift.  She was started on hypertonic saline and EEG done showing evidence of epileptogenicity with high potential for seizures.  Work-up also revealed subocclusive clot in right distal right transverse sinus and right sigmoid dural venous sinus and she was started on IV heparin.  Dr. Erlinda Hong felt that patient with large right frontoparietal venous infarct  with hemorrhagic conversion due to unclear etiology.  Neurosurgery was consulted due to concerns about the herniation and recommended conservative care with serial CCT for monitoring.  She was loaded with Dilantin due to concerns of seizure and placed on long-term EEG.  Dilantin was discontinued and Keppra added on 02/26.  Hypertonic saline was weaned off and mentation was slowly improving.  Repeat CTA/V of 02/27 showed evolving hemorrhagic infarct with mild improvement in mass effect and improved nonocclusive thrombus in right distal transverse and sigmoid sinus.  She was transition to [place] on 02/28 with neurology recommending 3 to 6 months of AC as well as close neurological follow-up.  Hospital course was significant for issues with headaches, hypotension, tachycardia, left-sided weakness with balance deficits as well as cognitive deficits and disorientation.  CIR was recommended due to functional decline.   Hospital Course: April Reilly was admitted to rehab 09/19/2021 for inpatient therapies to consist of PT, ST and OT at least three hours five days a week. Past admission physiatrist, therapy team and rehab RN have worked together to provide customized collaborative inpatient rehab.  Her blood pressures were monitored on TID basis and have been variable. BB was held briefly then resumed due to upward trend off  medications. Her po intake has been good and she is continent of B/B. She continues on megace for dysfunctional bleeding and was advised to reschedule her appointment with local GYN to evaluation and follow  up.  Follow-up CBC shows acute blood loss anemia to be stable.  Follow-up check of electrolytes showed downward trend in sodium and patient was advised to increase salt intake after discharge as well as repeat labs in 5 to 7 days.  She has been making gains during her stay but patient/family requested shorter LOS. She currently requires supervision  to min assist for safety and decreased  awareness of deficits. Family was advised to assist/manage medications after discharge. She will continue to receive follow up outpatient PT, OT and ST at [facility] neuro rehab after discharge.    Rehab course: During patient's stay in rehab weekly team conferences were held to monitor patient's progress, set goals and discuss barriers to discharge. At admission, patient required min assist with mobility and with ADL tasks.  She exhibited cognitive impairments affecting attention, functional problem-solving, recall as well as intellectual awareness of errors.  Cognitive testing revealed  SLUMS score 15/30.  She  has had improvement in activity tolerance, balance, postural control as well as ability to compensate for deficits.  She requires supervision to complete ADL tasks safely. She requires supervision with verbal cues for transfers and to ambulate 300 feet with verbal cues and use of rolling walker.  She requires contact-guard assist to climb 12 stairs. She requires supervision to min assist with cognitive tasks.  Family education was completed with daughter.  Discharge disposition: 01-Home or Self Care  Diet: Heart healthy  Special Instructions: 1.  No driving for 6 months due to concerns of seizure.  Neurology to clear patient prior to resumption of driving. 2.  Will need repeat BMET in 5 to 7 days to monitor sodium levels. 3. Reschedule GYN appointment for follow up on dysfunctional bleeding.  4. Avoid asa, asa containing products, NSAIDs and alcohol.   Discharge Instructions     AMB Referral to [facility] Coordinaton   Complete by: As directed    [place]  PCP: Pcp, No Please assign patient to community for post hospital and post rehab [facility] and follow up, thanks  Natividad Brood, RN BSN [facility] Liaison  [phone] business mobile phone Toll free office [phone]  Fax number:  [phone] [email or web address]   Reason for Referral: [facility] Disease Management ([employer] payers)   Disease managment services needed: Nurse Case Manager   Diagnoses of: Stroke: Ischemic/TIA   Expected date of contact: Emergent - 3 Days   Ambulatory referral to Neurology   Complete by: As directed    An appointment is requested in approximately: 2 weeks follow up Venous thrombosis/seizure      Allergies as of 09/26/2021   No Known Allergies      Medication List     STOP taking these medications    ibuprofen 200 MG tablet Commonly known as: ADVIL       TAKE these medications    atorvastatin 40 MG tablet Commonly known as: LIPITOR Take 1 tablet (40 mg total) by mouth daily.   butalbital-acetaminophen-caffeine 50-325-40 MG tablet Commonly known as: FIORICET Take 1 tablet by mouth 2 (two) times daily as needed for migraine (severe HA). Notes to patient: Limit to one pill per day   Eliquis 5 MG Tabs tablet Generic drug: apixaban Take 1 tablet (5 mg total) by mouth 2 (two) times daily.   levETIRAcetam 500 MG tablet Commonly known as: KEPPRA Take 1 tablet (500 mg total) by mouth 2 (two) times daily.   magnesium oxide 400 MG tablet Commonly known as:  MAG-OX Take 1/2 tablets (200 mg total) by mouth at bedtime.   megestrol 40 MG tablet Commonly known as: MEGACE Take 1 tablet (40 mg total) by mouth 2 (two) times daily.   melatonin 3 MG Tabs tablet Take 1 tablet (3 mg total) by mouth at bedtime.   metoprolol tartrate 25 MG tablet Commonly known as: LOPRESSOR Take 1/2 tablet (12.5 mg total) by mouth 2 (two) times daily. What changed: how much to take   mirtazapine 15 MG tablet Commonly known as: REMERON Take 1 tablet (15 mg total) by mouth at bedtime.   PARoxetine 10 MG tablet Commonly known as: PAXIL Take 1 tablet (10 mg total) by mouth daily.   Senexon-S 8.6-50 MG tablet Generic drug: senna-docusate Take 2 tablets by  mouth at bedtime.   topiramate 25 MG tablet Commonly known as: TOPAMAX Take 1 tablet (25 mg total) by mouth 2 (two) times daily. Notes to patient: This is long acting medication for headaches. Contact Dr. Adam Phenix for adjustment if needed.    traMADol 50 MG tablet Commonly known as: ULTRAM Take 1/2 tablets (25 mg total) by mouth every 12 (twelve) hours as needed for severe pain. What changed:  when to take this reasons to take this Notes to patient: Take as needed for headaches        Follow-up Information     Raulkar, Clide Deutscher, MD Follow up.   Specialty: Physical Medicine and Rehabilitation Why: 02/05/22 please arrive at 2:20pm for 2:40pm appointment, thank you! Contact information: [address] [place] [address] [phone]         Camillia Herter, NP Follow up on 10/22/2021.   Specialty: Nurse Practitioner Why: Appointment @ 2:00 PM Contact information: [place] [address] [place] Follow up.   Why: call for follow up appointment if you have not heard from them by Monday March 13th. Contact information: [address]     [address] [place] [address] [phone]                Signed: Bary Leriche 09/26/2021, 4:14 PM

## 2021-09-26 NOTE — Progress Notes (Signed)
Discharge instructions provided by Reesa Chew, PA. Patient obtained discharged medications from pharmacy. Staff assisted patient off the unit, patient accompanied by daughter. Patient discharge private car with no issues. ? ? ?Yehuda Mao, LPN ?

## 2021-09-26 NOTE — Patient Outreach (Signed)
Received a referral from [facility] Liaison for [name].  ?I have assigned Valente David, RN to call for follow up and determine if there are any Case Management needs.  ?  ?Arville Care, CBCS, CMAA ?[place] Management Assistant ?[place] [facility] ?[phone]  ?

## 2021-09-26 NOTE — Progress Notes (Addendum)
Inpatient Rehabilitation Discharge Medication Review by a Pharmacist ? ?A complete drug regimen review was completed for this patient to identify any potential clinically significant medication issues. ? ?High Risk Drug Classes Is patient taking? Indication by Medication  ?Antipsychotic No   ?Anticoagulant Yes Eliquis for CVA/Afib  ?Antibiotic No   ?Opioid Yes Tramadol for pain  ?Antiplatelet No   ?Hypoglycemics/insulin No   ?Vasoactive Medication Yes Metoprolol for BP  ?Chemotherapy No   ?Other Yes Atorvastatin for HLD ?Keppra, Topamax for seizure ppx / HAs ?Remeron/Paxil for mood, anxiety  ? ? ? ?Type of Medication Issue Identified Description of Issue Recommendation(s)  ?Drug Interaction(s) (clinically significant) ?    ?Duplicate Therapy ?    ?Allergy ?    ?No Medication Administration End Date ?    ?Incorrect Dose ?    ?Additional Drug Therapy Needed ?    ?Significant med changes from prior encounter (inform family/care partners about these prior to discharge).    ?Other ?    ? ? ?Clinically significant medication issues were identified that warrant physician communication and completion of prescribed/recommended actions by midnight of the next day:  No ? ? ?Time spent performing this drug regimen review (minutes):  20 minutes ? ? ?Tad Moore ?09/26/2021 9:35 AM ?

## 2021-09-26 NOTE — Progress Notes (Signed)
?                                                       PROGRESS NOTE ? ? ?Subjective/Complaints: ?Stable for d/c today ?Sleepy this morning, will wean remeron outpatient ? ?ROS: Headaches improved, appetite improved ? ?Objective: ?  ?No results found. ?Recent Labs  ?  09/24/21 ?0541  ?WBC 5.1  ?HGB 9.7*  ?HCT 31.8*  ?PLT 352  ? ? ?Recent Labs  ?  09/24/21 ?0541  ?NA 132*  ?K 4.0  ?CL 100  ?CO2 22  ?GLUCOSE 97  ?BUN 17  ?CREATININE 0.71  ?CALCIUM 9.3  ? ? ? ?Intake/Output Summary (Last 24 hours) at 09/26/2021 0903 ?Last data filed at 09/25/2021 1816 ?Gross per 24 hour  ?Intake 475 ml  ?Output --  ?Net 475 ml  ? ?  ? ?  ? ?Physical Exam: ?Vital Signs ?Blood pressure 97/67, pulse 87, temperature 98.9 ?F (37.2 ?C), temperature source Oral, resp. rate 14, SpO2 98 %. ?Gen: no distress, normal appearing ?HEENT: oral mucosa pink and moist, NCAT ?Cardio: Reg rate ?Chest: normal effort, normal rate of breathing ?Abd: soft, non-distended ?Ext: no edema ?Psych: pleasant, normal affect ?Skin: intact ?Neurological:  ?   Mental Status: She is alert and oriented to person, place, and time.  ?   Comments: fair attention, distracted at times. Limited insight and awareness. Confused. Had difficulty recalling medical history. LUE weakness noted.   ?  ? ? ?Assessment/Plan: ?1. Functional deficits which require 3+ hours per day of interdisciplinary therapy in a comprehensive inpatient rehab setting. ?Physiatrist is providing close team supervision and 24 hour management of active medical problems listed below. ?Physiatrist and rehab team continue to assess barriers to discharge/monitor patient progress toward functional and medical goals ? ?Care Tool: ? ?Bathing ?   ?Body parts bathed by patient: Left arm, Chest, Abdomen, Right arm, Front perineal area, Buttocks, Right upper leg, Left upper leg, Face, Right lower leg, Left lower leg  ?   ?  ?  ?Bathing assist Assist Level: Supervision/Verbal cueing ?  ?  ?Upper Body  Dressing/Undressing ?Upper body dressing   ?What is the patient wearing?: Pull over shirt ?   ?Upper body assist Assist Level: Supervision/Verbal cueing ?   ?Lower Body Dressing/Undressing ?Lower body dressing ? ? ?   ?What is the patient wearing?: Pants ? ?  ? ?Lower body assist Assist for lower body dressing: Supervision/Verbal cueing ?   ? ?Toileting ?Toileting    ?Toileting assist Assist for toileting: Supervision/Verbal cueing ?  ?  ?Transfers ?Chair/bed transfer ? ?Transfers assist ?   ? ?Chair/bed transfer assist level: Supervision/Verbal cueing ?  ?  ?Locomotion ?Ambulation ? ? ?Ambulation assist ? ?   ? ?Assist level: Supervision/Verbal cueing ?Assistive device: Walker-rolling ?Max distance: 257f  ? ?Walk 10 feet activity ? ? ?Assist ?   ? ?Assist level: Supervision/Verbal cueing ?Assistive device: Walker-rolling  ? ?Walk 50 feet activity ? ? ?Assist   ? ?Assist level: Supervision/Verbal cueing ?Assistive device: Walker-rolling  ? ? ?Walk 150 feet activity ? ? ?Assist   ? ?Assist level: Supervision/Verbal cueing ?Assistive device: No Device ?  ? ?Walk 10 feet on uneven surface  ?activity ? ? ?Assist Walk 10 feet on uneven surfaces activity did not occur: Safety/medical concerns ? ? ?Assist level: Supervision/Verbal  cueing ?Assistive device: Walker-rolling  ? ?Wheelchair ? ? ? ? ?Assist Is the patient using a wheelchair?: No ?  ?  ? ?  ?   ? ? ?Wheelchair 50 feet with 2 turns activity ? ? ? ?Assist ? ?  ?  ? ? ?   ? ?Wheelchair 150 feet activity  ? ? ? ?Assist ?   ? ? ?   ? ?Blood pressure 97/67, pulse 87, temperature 98.9 ?F (37.2 ?C), temperature source Oral, resp. rate 14, SpO2 98 %. ? ?Medical Problem List and Plan: ?1. 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            -patient may shower ?            -ELOS/Goals: 10-14 days S ?            -d/c home today ? HFU scheduled ?2.  Antithrombotics: ?-DVT/anticoagulation:  Pharmaceutical: Other (comment)--Eliquis ?             -antiplatelet therapy: N/A ?3. Headaches: Continue Fioricet (has been using TID on average).   ?--continue Topamax   ?-continue magnesium '250mg'$  HS ?4. Insomnia: replace topamax with trazodone. Wean remeron outpatient ?5. Neuropsych: This patient is not fully  capable of making decisions on his own behalf. ?6. Skin/Wound Care: Routine pressure relief measures.  ?7. Fluids/Electrolytes/Nutrition: Monitor I/O.   ? -poor po intake---encourage PO ? -d/c megace ? 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 Continue to monitor H/H as now on Ac.  ?9. Concern for Seizure: Continue Keppra 500 mg bid.  ?10. Tachycardia: add back lopressor 12.'5mg'$  daily.  ?11. Hyperlipidemia: Continue Lipitor.  ?12. Suboptimal potassium:supplement with 20 meq klor ?13.Hypotension:d/c lopressor ?14.Overweight: provide dietary education. D/c megace, wean remeron outpatient.  ? ? >30 minutes spent in discharge of patient including review of medications and follow-up appointments, physical examination, and in answering all patient's questions  ? ?LOS: ?7 days ?A FACE TO FACE EVALUATION WAS PERFORMED ? ?Martha Clan P Jerardo Costabile ?09/26/2021, 9:03 AM  ? ?  ?

## 2021-09-26 NOTE — Progress Notes (Addendum)
Occupational Therapy Discharge Summary ? ?Patient Details  ?Name: April Reilly ?MRN: [id number] ?Date of Birth: 26-Jun-1971 ? ?Today's Date: 09/25/2021 ?OT Individual Time:   6237-6283 ?  OT Individual Time Calculation: 30 mins ? ? ?Patient has met 9 of 9 long term goals due to improved activity tolerance, improved balance, postural control, ability to compensate for deficits, functional use of  LEFT upper extremity, improved attention, improved awareness, and improved coordination.  Patient to discharge at overall Supervision level.  Patient's care partner not present for family education in person, however has been verbally educated multiple times and reports good understanding of all responsibilities and receptive to providing for their mom; Pt is independent to provide the necessary cognitive assistance at discharge. ? ?Skilled Intervention:  Pt semi reclined in bed, asking to use bathroom.  Pt completed supine to sit, sit to stand, and ambulated to bathroom with distant supervision/mod I.  Pt completed toileting with mod I and toilet transfer with distant supervision. Pt washed hands and ambulated back to bed with mod I.  Assessed BIMs and performance skills per below.  Call bell in reach, bed alarm on.   ? ?Reasons goals not met: NA ? ?Recommendation:  ?No further skilled OT indicated at this time. ? ?Equipment: ?No equipment provided ? ?Reasons for discharge: treatment goals met ? ?Patient/family agrees with progress made and goals achieved: Yes ? ?OT Discharge ?Precautions/Restrictions  ?Precautions ?Precautions: Fall ?Restrictions ?Weight Bearing Restrictions: No ?General ?Chart Reviewed: Yes ?ADL ?ADL ?Eating: Independent ?Grooming: Setup ?Upper Body Bathing: Setup ?Where Assessed-Upper Body Bathing: Shower ?Lower Body Bathing: Supervision/safety ?Where Assessed-Lower Body Bathing: Shower ?Upper Body Dressing: Modified independent (Device) ?Where Assessed-Upper Body Dressing: Edge of bed ?Lower Body Dressing:  Modified independent ?Where Assessed-Lower Body Dressing: Edge of bed ?Toileting: Modified independent ?Where Assessed-Toileting: Toilet ?Toilet Transfer: Distant supervision ?Toilet Transfer Method: Ambulating ?Tub/Shower Transfer: Distant supervision ?Tub/Shower Transfer Method: Ambulating ?Tub/Shower Equipment: Radio broadcast assistant ?Walk-In Shower Transfer: Contact guard ?Walk-In Shower Transfer Method: Ambulating ?Walk-In Shower Equipment: Civil engineer, contracting with back, Grab bars ?Vision ?Baseline Vision/History: 0 No visual deficits ?Patient Visual Report: No change from baseline ?Vision Assessment?: No apparent visual deficits ?Perception  ?Perception: Within Functional Limits ?Praxis ?Praxis: Intact ?Cognition ?Overall Cognitive Status: Impaired/Different from baseline ?Arousal/Alertness: Awake/alert ?Orientation Level: Oriented X4 ?Year: 2023 ?Month: March ?Day of Week: Correct ?Attention: Sustained ?Focused Attention: Appears intact ?Sustained Attention: Impaired ?Sustained Attention Impairment: Functional basic ?Selective Attention: Impaired ?Selective Attention Impairment: Verbal basic;Functional basic ?Memory: Impaired ?Memory Impairment: Storage deficit;Retrieval deficit ?Immediate Memory Recall: Sock;Blue;Bed ?Memory Recall Sock: Without Cue ?Memory Recall Blue: Without Cue ?Memory Recall Bed: Without Cue ?Awareness: Impaired ?Awareness Impairment: Intellectual impairment ?Problem Solving: Impaired ?Problem Solving Impairment: Verbal basic;Functional basic ?Executive Function: Self Monitoring;Self Correcting ?Self Monitoring: Impaired ?Self Monitoring Impairment: Verbal basic;Functional basic ?Self Correcting: Impaired ?Self Correcting Impairment: Verbal basic;Functional basic ?Safety/Judgment: Impaired ?Sensation ?Sensation ?Light Touch: Appears Intact ?Hot/Cold: Appears Intact ?Proprioception: Appears Intact ?Stereognosis: Appears Intact ?Coordination ?Gross Motor Movements are Fluid and Coordinated: No ?Fine  Motor Movements are Fluid and Coordinated: Yes ?Coordination and Movement Description: decreased due to L hemiparesis; improved since date of evaluation ?Finger Nose Finger Test: WFL BUE ?Motor  ?Motor ?Motor: Hemiplegia ?Motor - Discharge Observations: mild L hemi ?Mobility  ?Transfers ?Sit to Stand: Independent ?Stand to Sit: Independent  ?Trunk/Postural Assessment  ?Cervical Assessment ?Cervical Assessment: Within Functional Limits ?Thoracic Assessment ?Thoracic Assessment: Within Functional Limits ?Lumbar Assessment ?Lumbar Assessment: Within Functional Limits ?Postural Control ?Postural Control: Within Functional Limits  ?Balance ?Balance ?Balance  Assessed: Yes ?Static Sitting Balance ?Static Sitting - Balance Support: Feet supported ?Static Sitting - Level of Assistance: 7: Independent ?Dynamic Sitting Balance ?Dynamic Sitting - Balance Support: Feet supported ?Dynamic Sitting - Level of Assistance: 6: Modified independent (Device/Increase time) ?Static Standing Balance ?Static Standing - Balance Support: Bilateral upper extremity supported ?Static Standing - Level of Assistance: 5: Stand by assistance ?Dynamic Standing Balance ?Dynamic Standing - Balance Support: During functional activity;Bilateral upper extremity supported ?Dynamic Standing - Level of Assistance: 5: Stand by assistance ?Extremity/Trunk Assessment ?RUE Assessment ?RUE Assessment: Within Functional Limits ?LUE Assessment ?LUE Assessment: Within Functional Limits ? ? ?Caryl Asp Salihah Peckham ?09/26/2021, 12:23 PM ?

## 2021-09-27 ENCOUNTER — Other Ambulatory Visit: Payer: Self-pay | Admitting: *Deleted

## 2021-09-27 NOTE — Patient Outreach (Signed)
[facility]) Care Management ? ?09/27/2021 ? ?April Reilly ?10-06-1970 ?[id number] ? ? ?Transition of care call  ?Referral received: 09/26/2021 ?Initial outreach: 09/27/2021 ?Insurance: [employer] ? ?Initial telephone call to patient, 2 HIPAA identifiers verified. No ongoing care management needs identified; Patient verified she is active with MyChart and [facility] via [facility]. States she has 4 daughters with 3 medical inclined and 1 in pharmacy. States they are very active in her life with medication management and supportive with needed transportation to her medical appointments. Offered [facility] due to her recent diagnosis however pt declined all services indicating she has very supportive and who are again medically inclined with her daughters to assist with her ongoing needs.  ? ?Offered assistance with finding a local primary provider and stressed the importance of having a local provider to address immediate needs. Pt states she has a primary provider in [place] and will be following up with this provider next week. Pt states she will consider finding a provider locally and has connected with but not actually pursued this route. RN offered assistance and provided the hotline for [facility] "Find a Doctor" contact number for a list of providers in the area. Pt works at [employer] and may consider a local provider. Offered to continue to follow up weekly or monthly concerning her recent admission and medical conditional however pt opt to declined but receptive to [facility] packet of information for any future needs. No other needs at this time as case will be closed. ? ?Pt opt to declined any further follow up calls concerning this recent hospital discharge. Case will be closed at this time. ? ?Raina Mina, RN ?Care Management Coordinator ?[place] ?Main Office [phone]  ?

## 2021-09-28 ENCOUNTER — Telehealth: Payer: Self-pay

## 2021-09-28 NOTE — Telephone Encounter (Signed)
Transitional Care Call--who you spoke with April Reilly (daughter) ph [phone]. ? ? ?Are you/is patient experiencing any problems since coming home? No.  Are there any questions regarding any aspect of care? No ?Are there any questions regarding medications administration/dosing? Yes. Are meds being taken as prescribed?Megestrol 40 MG is not taken. Patient afraid it will cause a stroke.(Dr. Ranell Patrick will be informed).Patient should review meds with caller to confirm. Per April Reilly (Daughter) all other medications are being taken as prescribed.  ?Have there been any falls? No. ?Has Home Health been to the house and/or have they contacted you? Daughter are taking care of Mother. If not, have you tried to contact them? N/A Can we help you contact them? April Reilly will call [facility] if no one call her by Monday 3013/2023. ?Are bowels and bladder emptying properly? No problem. Are there any unexpected incontinence issues? No problem.If applicable, is patient following bowel/bladder programs? N/A ?Any fevers, problems with breathing, unexpected pain? No. ?Are there any skin problems or new areas of breakdown?No. ?Has the patient/family member arranged specialty MD follow up (ie cardiology/neurology/renal/surgical/etc)?Yes.  Can we help arrange?No need.  ?Does the patient need any other services or support that we can help arrange? No. ?Are caregivers following through as expected in assisting the patient?Yes.  ?       11. Has the patient quit smoking, drinking alcohol, or using drugs as recommended? Per April Reilly no alcohol in the home. Patient does not smoke or use drugs.  ? ?Appointment Date/Time/ Arrival time/ and who they are seeing Dr. Ranell Patrick on 02/05/2022 at 2:40 pm. ?[place] [address]  ?

## 2021-09-28 NOTE — Telephone Encounter (Signed)
Per Daughter Edward Jolly (phone [phone]) Krystale Rinkenberger should be taking Megestrol 40 MG, PO twice daily. Which is on the discharge summary. However she is not taking it. Patient was told it could cause strokes. Patient and family need guidance on the medication.  ? ?Please advise ph# [phone]. She has a follow up in July with you  ?Thank you ?

## 2021-10-01 ENCOUNTER — Encounter: Payer: 59 | Attending: Registered Nurse | Admitting: Registered Nurse

## 2021-10-01 NOTE — Telephone Encounter (Signed)
April Reilly is aware of Dr. Aline August reply about the Megestrol.  ?

## 2021-10-02 ENCOUNTER — Ambulatory Visit: Payer: Self-pay | Admitting: Physical Therapy

## 2021-10-02 ENCOUNTER — Encounter: Payer: Self-pay | Admitting: Occupational Therapy

## 2021-10-02 ENCOUNTER — Encounter: Payer: Self-pay | Admitting: Speech Pathology

## 2021-10-04 ENCOUNTER — Other Ambulatory Visit: Payer: Self-pay

## 2021-10-04 ENCOUNTER — Encounter: Payer: Self-pay | Admitting: Occupational Therapy

## 2021-10-04 ENCOUNTER — Ambulatory Visit: Payer: Self-pay | Admitting: Physical Therapy

## 2021-10-04 ENCOUNTER — Ambulatory Visit: Payer: 59 | Attending: Physical Medicine and Rehabilitation | Admitting: Speech Pathology

## 2021-10-04 DIAGNOSIS — R41840 Attention and concentration deficit: Secondary | ICD-10-CM | POA: Insufficient documentation

## 2021-10-04 DIAGNOSIS — R41841 Cognitive communication deficit: Secondary | ICD-10-CM | POA: Insufficient documentation

## 2021-10-04 DIAGNOSIS — M25512 Pain in left shoulder: Secondary | ICD-10-CM | POA: Insufficient documentation

## 2021-10-04 DIAGNOSIS — R41844 Frontal lobe and executive function deficit: Secondary | ICD-10-CM | POA: Insufficient documentation

## 2021-10-04 DIAGNOSIS — R278 Other lack of coordination: Secondary | ICD-10-CM | POA: Insufficient documentation

## 2021-10-04 DIAGNOSIS — M6281 Muscle weakness (generalized): Secondary | ICD-10-CM | POA: Insufficient documentation

## 2021-10-04 DIAGNOSIS — R2681 Unsteadiness on feet: Secondary | ICD-10-CM | POA: Insufficient documentation

## 2021-10-10 ENCOUNTER — Ambulatory Visit (INDEPENDENT_AMBULATORY_CARE_PROVIDER_SITE_OTHER): Payer: 59 | Admitting: Neurology

## 2021-10-10 ENCOUNTER — Encounter: Payer: Self-pay | Admitting: Neurology

## 2021-10-10 VITALS — BP 123/66 | HR 80 | Ht 59.0 in | Wt 136.0 lb

## 2021-10-10 DIAGNOSIS — G936 Cerebral edema: Secondary | ICD-10-CM | POA: Diagnosis not present

## 2021-10-10 DIAGNOSIS — G08 Intracranial and intraspinal phlebitis and thrombophlebitis: Secondary | ICD-10-CM | POA: Diagnosis not present

## 2021-10-10 DIAGNOSIS — R569 Unspecified convulsions: Secondary | ICD-10-CM

## 2021-10-10 DIAGNOSIS — D5703 Hb-ss disease with cerebral vascular involvement: Secondary | ICD-10-CM

## 2021-10-10 DIAGNOSIS — I618 Other nontraumatic intracerebral hemorrhage: Secondary | ICD-10-CM | POA: Diagnosis not present

## 2021-10-10 NOTE — Progress Notes (Signed)
? ?[facility] ? ?PATIENT: April Reilly ?DOB: Nov 19, 1970 ? ?REQUESTING CLINICIAN: Love, Ivan Anchors, PA-C ?HISTORY FROM: Patient/daughter  ?REASON FOR VISIT: Venous thrombosis  ? ? ?HISTORICAL ? ?CHIEF COMPLAINT:  ?Chief Complaint  ?Patient presents with  ? New Patient (Initial Visit)  ?  Rm 12. Accompanied by daughter. ?NP/internal hospital referral for cerebral venous sinus thrombosis, intracranial hemorrhage and seizures.  ? ? 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 ?Patient was admitted to the hospital for 3 days of malaise, she was noted to have a seizure in the ED and also see develop a seizure when she was she was admitted.  She was initiated on anticoagulation.  Neurosurgery was consulted but patient did not have a craniectomy.  She developed seizures, started on Keppra ? ?Her symptoms improve, she was discharge to acute rehab and her plan was to continue with anticoagulation for at least 6 months.  Patient report prior to her hospitalization she has been doing well, she has chronic anemia, sickle cell disease but she was not taken on any medication.  She reports a few weeks prior to presentation she developed vaginal bleeding and she was started on megestrol acetate.  ?Since discharge from the hospital, daughter reported improvement of her mental status, confusion improved, she does continue with physical therapy and she is getting more independent.  She is compliant with all medications.  Patient reports prior to her hospitalization she was not taking any medication but since discharge she is on 13 medications, she feels like she does not need all of them. ? ? ?Hospital course below. ?April Reilly is a 51 y.o. female with history of SS trait, uterine fibroid, abnormal vaginal bleeding x4 months who was admitted on 09/10/2021 with 3-day history of malaise, fall and progressive somnolence.  She was found to have RLL aspiration pneumonia as well as IPH due to right hemorrhagic infarct with edema and right frontal, parietal, occipital and temporal lobes and 9 mm right to left midline shift.  She was started on hypertonic saline and EEG done showing evidence of epileptogenicity with high potential for seizures.  Work-up also revealed subocclusive clot in right distal right transverse sinus and right sigmoid dural venous sinus and she was started on IV heparin.  Dr. Erlinda Hong felt that patient with large right frontoparietal venous infarct with hemorrhagic conversion due to unclear etiology.  Neurosurgery was consulted due to concerns about the herniation and recommended conservative care with serial CCT for monitoring. ?She was loaded with Dilantin due to concerns of seizure and placed on long-term EEG.  Dilantin was discontinued and Keppra added on 02/26.  Hypertonic saline was weaned off and mentation was slowly improving.  Repeat CTA/V of 02/27 showed evolving hemorrhagic infarct with mild improvement in mass effect and improved nonocclusive thrombus in right distal transverse and sigmoid sinus.  She was transition to [place] on 02/28 with neurology recommending 3 to 6 months of AC as well as close neurological follow-up.  Hospital course was significant for issues with headaches, hypotension, tachycardia, left-sided weakness with balance deficits as well as cognitive deficits and disorientation.  CIR was recommended due to functional decline.  ? ? ? ?OTHER MEDICAL CONDITIONS: SS Trait, anemia  ? ? ?REVIEW OF SYSTEMS:  Full 14 system review of systems performed and negative with exception of: as noted in the HPI  ? ?ALLERGIES: ?No Known Allergies ? ?HOME MEDICATIONS: ?Outpatient Medications Prior to Visit   ?Medication Sig Dispense Refill  ? apixaban (ELIQUIS) 5 MG TABS tablet Take 1 tablet (5 mg total) by mouth 2 (two) times daily. 60 tablet 0  ? atorvastatin (LIPITOR) 40 MG tablet Take 1 tablet (40 mg total) by mouth daily. 30 tablet 0  ? levETIRAcetam (KEPPRA) 500 MG tablet Take 1 tablet (500 mg total) by mouth 2 (two) times daily. 60 tablet 0  ? metoprolol tartrate (LOPRESSOR) 25 MG tablet Take 1/2 tablet (12.5 mg total) by mouth 2 (two) times daily. 30 tablet 0  ? PARoxetine (PAXIL) 10 MG tablet Take 1 tablet (10 mg total) by mouth daily. 30 tablet 0  ? butalbital-acetaminophen-caffeine (FIORICET) 50-325-40 MG tablet Take 1 tablet by mouth 2 (two) times daily as needed for migraine (severe HA). 14 tablet 0  ? magnesium oxide (MAG-OX) 400 MG tablet Take 1/2 tablets (200 mg total) by mouth at bedtime. 30 tablet 0  ? megestrol (MEGACE) 40 MG tablet Take 1 tablet (40 mg total) by mouth 2 (two) times daily. 60 tablet 3  ? melatonin 3 MG TABS tablet Take 1 tablet (3 mg total) by mouth at bedtime. 30 tablet 0  ? mirtazapine (REMERON) 15 MG tablet Take 1 tablet (15 mg total) by mouth at bedtime. 30 tablet 0  ? senna-docusate (SENOKOT-S) 8.6-50 MG tablet Take 2 tablets by mouth at bedtime. 60 tablet 0  ? topiramate (TOPAMAX) 25 MG tablet Take 1 tablet (25 mg total) by mouth 2 (two) times daily. 60 tablet 0  ? traMADol (ULTRAM) 50 MG tablet Take 1/2 tablets (25 mg total) by mouth every 12 (twelve) hours as needed for severe pain. 14 tablet 0  ? ?No facility-administered medications prior to visit.  ? ? ?PAST MEDICAL HISTORY: ?Past Medical History:  ?Diagnosis Date  ? Asthma   ? Vaginal bleeding, abnormal   ? for 4 months  ? ? ?PAST SURGICAL HISTORY: ?Past Surgical History:  ?Procedure Laterality Date  ? FOOT SURGERY    ? TUBAL LIGATION    ? ? ?FAMILY HISTORY: ?Family History  ?Problem Relation Age of Onset  ? Clotting disorder Mother   ? ? ?SOCIAL HISTORY: ?Social History  ? ?Socioeconomic History  ? Marital status: Single   ?  Spouse name: Not on file  ? Number of children: Not on file  ? Years of education: Not on file  ? Highest education level: Not on file  ?Occupational History  ? Not on file  ?Tobacco Use  ? Smoking status: Never  ? Smokeless tobacco: Never  ?Substance and Sexual Activity  ? Alcohol use: Yes  ? Drug use: Never  ? Sexual activity: Yes  ?Other Topics Concern  ? Not on file  ?Social History Narrative  ? Not on file  ? ?Social Determinants of Health  ? ?Financial Resource Strain: Not on file  ?Food Insecurity: Not on file  ?Transportation Needs: Not on file  ?Physical Activity: Not on file  ?Stress: Not on file  ?Social Connections: Not on file  ?Intimate Partner Violence: Not on file  ? ? ?PHYSICAL EXAM ? ?GENERAL EXAM/CONSTITUTIONAL: ?Vitals:  ?Vitals:  ? 10/10/21 1039  ?BP: 123/66  ?Pulse: 80  ?Weight: 136 lb (61.7 kg)  ?Height: '4\' 11"'$  (1.499 m)  ? ?Body mass index is 27.47 kg/m?. ?Wt Readings from Last 3  Encounters:  ?10/10/21 136 lb (61.7 kg)  ?08/21/21 147 lb (66.7 kg)  ? ?Patient is in no distress; well developed, nourished and groomed; neck is supple ? ?EYES: ?Pupils round and reactive to light, Visual fields full to confrontation, Extraocular movements intacts,  ? ?MUSCULOSKELETAL: ?Gait, strength, tone, movements noted in Neurologic exam below ? ?NEUROLOGIC: ?MENTAL STATUS:  ?   ? View : No data to display.  ?  ?  ?  ? ?awake, alert, oriented to person, place and time ?recent and remote memory intact ?normal attention and concentration ?language fluent, comprehension intact, naming intact ?fund of knowledge appropriate ? ?CRANIAL NERVE:  ?2nd, 3rd, 4th, 6th - pupils equal and reactive to light, visual fields full to confrontation, extraocular muscles intact, no nystagmus ?5th - facial sensation symmetric ?7th - facial strength symmetric ?8th - hearing intact ?9th - palate elevates symmetrically, uvula midline ?11th - shoulder shrug symmetric ?12th - tongue protrusion midline ? ?MOTOR:  ?normal bulk and  tone, full strength in the BUE, BLE ? ?SENSORY:  ?normal and symmetric to light touch, pinprick, temperature, vibration ? ?COORDINATION:  ?finger-nose-finger, fine finger movements normal ? ?REFLEXES:  ?deep tend

## 2021-10-10 NOTE — Patient Instructions (Signed)
Continue with Eliquis twice daily  ?Continue with Lipitor 40 mg daily  ?Continue with Keppra 500 mg twice daily  ?Continue Metoprolol Tartrate 25 mg 1/2 tab twice daily  ?Continue with Paroxetine 10 mg daily  ?Return in 3 months  ?

## 2021-10-11 ENCOUNTER — Telehealth: Payer: Self-pay | Admitting: Neurology

## 2021-10-11 NOTE — Telephone Encounter (Signed)
[facility] order sent to GI- please schedule in May before appt with Dr. April Manson thank you. GI will reach out to the patient to schedule.  ?

## 2021-10-15 ENCOUNTER — Ambulatory Visit: Payer: 59

## 2021-10-15 ENCOUNTER — Ambulatory Visit: Payer: 59 | Admitting: Physical Therapy

## 2021-10-15 ENCOUNTER — Other Ambulatory Visit: Payer: Self-pay

## 2021-10-15 ENCOUNTER — Ambulatory Visit: Payer: 59 | Admitting: Occupational Therapy

## 2021-10-15 DIAGNOSIS — R41844 Frontal lobe and executive function deficit: Secondary | ICD-10-CM | POA: Diagnosis present

## 2021-10-15 DIAGNOSIS — R41841 Cognitive communication deficit: Secondary | ICD-10-CM | POA: Diagnosis present

## 2021-10-15 DIAGNOSIS — R278 Other lack of coordination: Secondary | ICD-10-CM

## 2021-10-15 DIAGNOSIS — R41840 Attention and concentration deficit: Secondary | ICD-10-CM | POA: Diagnosis present

## 2021-10-15 DIAGNOSIS — M6281 Muscle weakness (generalized): Secondary | ICD-10-CM

## 2021-10-15 DIAGNOSIS — M25512 Pain in left shoulder: Secondary | ICD-10-CM

## 2021-10-15 DIAGNOSIS — R2681 Unsteadiness on feet: Secondary | ICD-10-CM

## 2021-10-15 NOTE — Therapy (Signed)
?OUTPATIENT PHYSICAL THERAPY NEURO EVALUATION ? ? ?Patient Name: April Reilly ?MRN: [id number] ?DOB:11/01/1970, 51 y.o., female ?Today's Date: 10/15/2021 ? ?PCP: Pcp, No ?REFERRING PROVIDER: Izora Ribas, MD ? ? PT End of Session - 10/15/21 1222   ? ? Visit Number 1   ? Number of Visits 5   plus eval  ? Date for PT Re-Evaluation 12/10/21   ? Authorization Type [facility]   ? PT Start Time [phone]   Pt arrived late  ? PT Stop Time 1018   ? PT Time Calculation (min) 40 min   ? Activity Tolerance Patient tolerated treatment well   ? Behavior During Therapy [facility] for tasks assessed/performed   ? ?  ?  ? ?  ? ? ?Past Medical History:  ?Diagnosis Date  ? Asthma   ? Vaginal bleeding, abnormal   ? for 4 months  ? ?Past Surgical History:  ?Procedure Laterality Date  ? FOOT SURGERY    ? TUBAL LIGATION    ? ?Patient Active Problem List  ? Diagnosis Date Noted  ? 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 Hyponatremia 09/26/2021  ? Abnormal LFTs 09/26/2021  ? Dysfunctional uterine bleeding 09/26/2021  ? Uterine fibroid 09/19/2021  ? 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 Essential hypertension 09/16/2021  ? Hyperlipidemia 09/16/2021  ? Seizure ([place]) 09/16/2021  ? Aspiration pneumonia ([place]) 09/16/2021  ? Sickle cell disease ([place]) 09/16/2021  ? Iron deficiency anemia 09/16/2021  ? Cerebral edema ([place]) 09/10/2021  ? Acute metabolic encephalopathy 11/91/4782  ? ICH (intracerebral hemorrhage) ([place]) 09/10/2021  ? ? ?ONSET DATE: 09/24/2021 (referral) ? ?REFERRING DIAG: R-CVA (no ICD-10 provided) ? ?THERAPY DIAG:  ?Muscle weakness (generalized) ? ?Unsteadiness on feet ? ?SUBJECTIVE:  ?                                                                                                                                                                                           ? ?SUBJECTIVE STATEMENT: ?Pt reported she went to urgent care last month due to vaginal bleeding and was put on a medication that is contraindicated  for her sickle cell anemia. Was found on the floor by her daughter but does not remember what happened. Developed blood clots in brain and had 2 strokes, states her "head would explode if they tried to remove the clot in ICU". Pt recently completed stay in CIR.   ? ?Pt accompanied by: self ? ?PERTINENT HISTORY: No driving for 6 months due to concerns of seizure. Neurology to clear patient prior to resumption of driving.  ? ?PAIN:  ?Are you having pain? No ? ?PRECAUTIONS: Fall, seizure.  Not allowed to cook, work or drive until December.  ? ?WEIGHT BEARING RESTRICTIONS No ? ?FALLS: Has patient fallen in last 6 months? No, Number of falls: 0 ? ?LIVING ENVIRONMENT: ?Lives with: lives alone ?Lives in: House/apartment ?Stairs: Yes: Internal: 3 steps; none ?Has following equipment at home: Gilford Rile - 2 wheeled, shower chair, and bed side commode ? ?PLOF: Independent ? ?PATIENT GOALS " To get back to a normal life"  ? ?OBJECTIVE:  ? ?DIAGNOSTIC FINDINGS: CTA/V of 02/27 showed evolving hemorrhagic infarct with mild improvement in mass effect and improved nonocclusive thrombus in right distal transverse and sigmoid sinus  ? ?COGNITION: ?Overall cognitive status: Impaired: Areas of impairment: Memory, Safety/judgement, and Awareness   ?  ?SENSATION: ?WFL  ?Pt reports numbness in her BLEs is baseline for her due to sick cell anemia.  ? ?COORDINATION: ?Heel to shin test WFL bilaterally  ? ?VITALS ?BP taken in LUE while seated: 117/81 mmHg, 61bpm  ? ?POSTURE: No Significant postural limitations ? ?MMT:   ? ?MMT Right ?10/15/2021 Left ?10/15/2021  ?Hip flexion 4 4+  ?Hip extension    ?Hip abduction 4+ 4+  ?Hip adduction 4+ 4+  ?Hip internal rotation    ?Hip external rotation    ?Knee flexion 5 5  ?Knee extension 5 5  ?Ankle dorsiflexion 5 5  ?Ankle plantarflexion 5 5  ?Ankle inversion    ?Ankle eversion    ?(Blank rows = not tested) ? ? ?TRANSFERS: ?Assistive device utilized: None  ?Sit to stand: Modified independence ?Stand to sit:  Modified independence ?Chair to chair: Modified independence ? ?GAIT: ?Gait pattern: WFL ?Distance walked: various clinic distances ?Assistive device utilized: None ?Level of assistance: Complete Independence ?Comments: Pt demonstrates no impairments with gait at this time  ? ?FUNCTIONAL TESTs:  ?5 times sit to stand: 12.29s  ?Gait velocity: 8s without AD= 4/1 ft/s  ? ?TODAY'S TREATMENT:  ?See clinical impression statement  ? ? ?PATIENT EDUCATION: ?Education details: POC, goals, outcomes assessment  ?Person educated: Patient ?Education method: Explanation and Demonstration ?Education comprehension: verbalized understanding and needs further education ? ? ?HOME EXERCISE PROGRAM: ?To be established next session  ? ? ? ?GOALS: ?Goals reviewed with patient? Yes ? ?SHORT TERM GOALS = LONG TERM GOALS DUE TO POC LENGTH Target date: 11/12/2021 ? ?Pt will be independent with final HEP for improved strength, balance, transfers and gait. ? ?Baseline:  ?Goal status: INITIAL ? ?2.  Update goal when FGA performed   ? ?Baseline:  ?Goal status: INITIAL ? ?3.  Pt will improve 5 x STS to less than or equal to 10 seconds without BUE support to demonstrate improved functional strength and transfer efficiency.  ? ?Baseline: 12.29s without BUE support  ?Goal status: INITIAL ? ? ?ASSESSMENT: ? ?CLINICAL IMPRESSION: ?Patient is a 51 year old female referred to Neuro OPPT for CVA. Pt's PMH is significant for: SS trait, uterine fibroid, abnormal vaginal bleeding. The following deficits were present during the exam: impaired safety awareness, impaired memory, reduced endurance. Balance assessment to be performed next session to assess if pt is an incr risk for falls. Pt would benefit from skilled PT to address these impairments and functional limitations to maximize functional mobility independence.  ? ?OBJECTIVE IMPAIRMENTS decreased activity tolerance, decreased cognition, decreased endurance, decreased knowledge of condition, decreased  safety awareness, and impaired perceived functional ability.  ? ?ACTIVITY LIMITATIONS driving, meal prep, and occupation.  ? ?PERSONAL FACTORS 1-2 comorbidities: seizure precautions and impaired safety awareness  are also affecting patient's functional outcome.  ? ? ?  REHAB POTENTIAL: Good ? ?CLINICAL DECISION MAKING: Evolving/moderate complexity ? ?EVALUATION COMPLEXITY: Moderate ? ?PLAN: ?PT FREQUENCY: 1x/week ? ?PT DURATION: 6 weeks ? ?PLANNED INTERVENTIONS: Therapeutic exercises, Therapeutic activity, Neuromuscular re-education, Balance training, Gait training, Patient/Family education, Joint mobilization, and Stair training ? ?PLAN FOR NEXT SESSION: FGA, does pt need PT? If not, cancel remainder of appointments. Establish HEP ? ? ?Cruzita Lederer Kanishk Stroebel, PT, DPT ?10/15/2021, 1:28 PM ? ? ? ? ? ?  ?

## 2021-10-15 NOTE — Therapy (Signed)
?OUTPATIENT SPEECH LANGUAGE PATHOLOGY EVALUATION ? ? ?Patient Name: April Reilly ?MRN: [id number] ?DOB:01-08-1971, 51 y.o., female ?Today's Date: 10/15/2021 ? ?PCP: Pcp, No ?REFERRING PROVIDER: Izora Ribas, MD ? ? End of Session - 10/15/21 1129   ? ? Visit Number 1   ? Number of Visits 17   ? Date for SLP Re-Evaluation 12/14/21   ? Authorization Type [facility]   ? Authorization Time Period needs auth after 25th ST visit   ? SLP Start Time 1017   ? SLP Stop Time  1102   ? SLP Time Calculation (min) 45 min   ? Activity Tolerance Patient tolerated treatment well   ? ?  ?  ? ?  ? ? ?Past Medical History:  ?Diagnosis Date  ? Asthma   ? Vaginal bleeding, abnormal   ? for 4 months  ? ?Past Surgical History:  ?Procedure Laterality Date  ? FOOT SURGERY    ? TUBAL LIGATION    ? ?Patient Active Problem List  ? Diagnosis Date Noted  ? 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 Hyponatremia 09/26/2021  ? Abnormal LFTs 09/26/2021  ? Dysfunctional uterine bleeding 09/26/2021  ? Uterine fibroid 09/19/2021  ? 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 Essential hypertension 09/16/2021  ? Hyperlipidemia 09/16/2021  ? Seizure ([place]) 09/16/2021  ? Aspiration pneumonia ([place]) 09/16/2021  ? Sickle cell disease ([place]) 09/16/2021  ? Iron deficiency anemia 09/16/2021  ? Cerebral edema ([place]) 09/10/2021  ? Acute metabolic encephalopathy 19/37/9024  ? ICH (intracerebral hemorrhage) ([place]) 09/10/2021  ? ? ?ONSET DATE: 09-10-21  ? ?REFERRING DIAG: R-CVA  ? ?THERAPY DIAG:  ?Cognitive communication deficit ? ?SUBJECTIVE:  ? ?SUBJECTIVE STATEMENT: ?"I don't remember (re: CIR ST)." ?Pt accompanied by: self ? ?PERTINENT HISTORY: April Reilly is a 51 year old female with history of SS trait, uterine fibroids, abnormal vaginal bleeding X 4 months otherwise in relatively good health who was admitted on 09/10/21 with 3 day history of malaise due to flu like symptoms with fall night prior to admission and progressive somnolence. She was  started on broad spectrum antibiotics for fever and leucocytosis due to RLL aspiration PNA. 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 She was started on hypertonic saline and EEG done showing evidence of epileptogenicity with high potential for seizures. CTA head/heck showed multifocal narrowing in R>L A1, M1 and proximal R-M2 segments likely due to vasospasm and hypertensity and IPH in right cerebral hemispheres concerning for infarct with hemorrhagic conversion. CT venogram done showing subocclusive clot in distal right transverse sinus and right sigmoid dural venous sinus.  ? ?PAIN:  ?Are you having pain? No ? ? ?FALLS: Has patient fallen in last 6 months?  No ? ?LIVING ENVIRONMENT: ?Lives with: lives alone (family monitoring/checking in frequently) ?Lives in: House/apartment ? 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 ? ?PATIENT GOALS "I want get back to my norm" ? ?OBJECTIVE:  ? ?COGNITION: ?Overall cognitive status: impaired ?Attention: Impaired: Sustained, Selective, Alternating ?Memory: Impaired: Working ?Awareness: Impaired: Intellectual, Emergent, and Anticipatory ?Executive function: Impaired: Problem solving, Planning, Error awareness, and Slow processing ?Behavior: Within functional limits ?Functional deficits: pt only aware of difficulty recalling DOW (currently texting grandchildren for the answer) ? ?COGNITIVE COMMUNICATION ?Following directions: Follows multi-step commands inconsistently  ?  Auditory comprehension: WFL ?Verbal expression: WFL ?Functional communication: WFL ? ?ORAL MOTOR EXAMINATION ?Facial : WFL ?Lingual: WFL ?Velum: WFL ?Mandible: WFL ?Cough: WFL ?Voice: WFL ? ?STANDARDIZED ASSESSMENTS: ?CLQT:  Memory: Moderate and Language: WNL ? ? PATIENT REPORTED OUTCOME MEASURES (PROM): ?Not completed today due to time constraints ? ? ?TODAY'S TREATMENT:  ?Educated patient on clinical observations and analysis of completed CLQT subtests. Decreased attention (sustained, focused, alternating), reduced short term recall (story retell, complex instructions) and impaired executive functioning (error awareness, problem solving, slow processing) resulted in missed overt errors, difficulty recalling and completing more complex instructions, and maintaining attention. Pt verbalized understanding of SLP education and agreeable for initiation of ST services to address cognitive linguistic skills as pt would like to return to baseline (independent management of ADLs/iADLs and return to work full time).  ? ? ?PATIENT EDUCATION: ?Education details: see above ?Person educated: Patient ?Education method: Explanation and Demonstration ?Education comprehension: verbalized understanding, returned demonstration, and needs further education ? ? ? ? ?GOALS: ?Goals reviewed with patient? Yes ? ?SHORT TERM GOALS: Target date: 11/16/2021 ? ?Pt will complete CLQT and PROM in first 1-2 ST sessions  ?Baseline: CLQT initiated  ?Goal status: INITIAL ? ?2.  Pt will recall and implement 2 attention/memory compensations to aid daily functioning and completion of iADLs with occasional min A over 2 sessions ?Baseline:  ?Goal status: INITIAL ? ?3.  Pt will use phone and/or calendar for orientation/time management given rare min A over 2 sessions  ?Baseline:  ?Goal status: INITIAL ? ?4. Pt will complete functional iADL tasks (medication/financial management tasks) with 80% accuracy given occasional min A over 2 sessions  ?Baseline:  ?Goal status: INITIAL ? ?5.  Pt will maintain sustained and focused attention for 15+ minutes for cognitive linguistic tasks given occasional min A over 2 sessions  ?Baseline:  ?Goal status: INITIAL ? ? ?LONG TERM GOALS: Target  date: 12/14/2021 ? ?Pt will recall and implement 4 attention/memory compensations to aid daily functioning and completion of iADLs with rare min A over 2 sessions ?Baseline:  ?Goal status: INITIAL ? ?2.  Pt will complete functional iADL tasks (medication/financial management tasks) at home given rare min A over 2 sessions  ?Baseline:  ?Goal status: INITIAL ? ?3.  Pt will maintain sustained and focused attention for 30+ minutes for cognitive linguistic tasks given rare min A over 2 sessions  ?Baseline:  ?Goal status: INITIAL ? ?4.  Pt will report improved cognitive linguistic functioning via PROM by 2 points at last ST session  ?Baseline:  ?Goal status: INITIAL ? ?ASSESSMENT: ? ?CLINICAL IMPRESSION: ?Patient is a 51 y.o. 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 Pt only partly aware of functional impact of current cognitive linguistic deficits (difficulty recalling DOW - texting family for answer). Pt agreeable to initiation of ST services to optimize return to baseline.  ? ?OBJECTIVE IMPAIRMENTS include attention, memory, awareness, and executive functioning. These impairments are limiting patient from managing medications, managing appointments, household responsibilities, and ADLs/IADLs. ?Factors affecting potential to achieve goals and functional outcome are family/community support.. Patient will benefit from skilled SLP services to address above impairments and improve overall function. ? ?REHAB POTENTIAL: Good ? ?PLAN: ?SLP FREQUENCY: 2x/week ? ?SLP DURATION: 8 weeks ? ?PLANNED INTERVENTIONS: Cognitive reorganization, Internal/external aids, Functional tasks, SLP instruction and  feedback, Compensatory strategies, and Patient/family education ? ? ? ?Marzetta Board, CCC-SLP ?10/15/2021, 11:30 AM ? ? ? ? ? ?

## 2021-10-15 NOTE — Therapy (Addendum)
?OUTPATIENT OCCUPATIONAL THERAPY NEURO EVALUATION ? ?Patient Name: April Reilly ?MRN: [id number] ?DOB:[birth year], 51 y.o., female ?Today's Date: 10/15/2021 ? ?PCP: Pcp, No ?REFERRING PROVIDER: Izora Ribas, MD ? ? OT End of Session - 10/15/21 1309   ? ? Visit Number 1   ? Number of Visits 25   ? Date for OT Re-Evaluation 01/07/22   ? Authorization Type [facility]   ? OT Start Time 1103   ? OT Stop Time 1145   ? OT Time Calculation (min) 42 min   ? Activity Tolerance Patient tolerated treatment well   ? Behavior During Therapy [facility] for tasks assessed/performed;Restless   ? ?  ?  ? ?  ? ? ?Past Medical History:  ?Diagnosis Date  ? Asthma   ? Vaginal bleeding, abnormal   ? for 4 months  ? ?Past Surgical History:  ?Procedure Laterality Date  ? FOOT SURGERY    ? TUBAL LIGATION    ? ?Patient Active Problem List  ? Diagnosis Date Noted  ? 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 Hyponatremia 09/26/2021  ? Abnormal LFTs 09/26/2021  ? Dysfunctional uterine bleeding 09/26/2021  ? Uterine fibroid 09/19/2021  ? 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 Essential hypertension 09/16/2021  ? Hyperlipidemia 09/16/2021  ? Seizure ([place]) 09/16/2021  ? Aspiration pneumonia ([place]) 09/16/2021  ? Sickle cell disease ([place]) 09/16/2021  ? Iron deficiency anemia 09/16/2021  ? Cerebral edema ([place]) 09/10/2021  ? Acute metabolic encephalopathy 97/58/8325  ? ICH (intracerebral hemorrhage) ([place]) 09/10/2021  ? ? ?ONSET DATE: 09/10/21 ? ?REFERRING DIAG: ICH-R frontal, parietal, occipital and temporal hemorrhage ? ?THERAPY DIAG:  ?Acute pain of left shoulder - Plan: Ot plan of care cert/re-cert ? ?Frontal lobe and executive function deficit - Plan: Ot plan of care cert/re-cert ? ?Attention and concentration deficit - Plan: Ot plan of care cert/re-cert ? ?Muscle weakness (generalized) - Plan: Ot plan of care cert/re-cert ? ?Other lack of coordination - Plan: Ot plan of care cert/re-cert ? ?SUBJECTIVE:  ? ?SUBJECTIVE STATEMENT: ?Pt  reports left shoulder pain ?Pt accompanied by: self ? ?PERTINENT HISTORY: 51 y.o. female admitted to [facility] on 09/10/2021 after fall out of bed followed by AMS. CT: significant edema in R frontal, parietal, occipital and temporal lobe with superimposed hemorrhage, . PMH includes sickle cell trait, anemia. Pt. d/c home on 09/26/21 following CIR.  ? ?PRECAUTIONS: Other: sickle cell , seizures, no driving, no work ? ?WEIGHT BEARING RESTRICTIONS No ? ?PAIN:  ?Are you having pain? Yes: NPRS scale: 3/10 ?Pain location: L shoulder  ?Pain description: aching ?Aggravating factors: movement ?Relieving factors: Not moving it  ? ?FALLS: Has patient fallen in last 6 months? No,  ? ?LIVING ENVIRONMENT: ?Lives with: alone ?Lives in: House/apartment ?Stairs: Yes: External: 3 steps; none ?Has following equipment at home: Shower bench ? ?PLOF: Independent, head chef at [employer] ? ?PATIENT GOALS decrease shoulder pain ? ?OBJECTIVE:  ? ?HAND DOMINANCE: Right ? ?ADLs: ?Overall ADLs: decreased safety awareness ?Transfers/ambulation related to ADLs:mod I ?Eating: Independent ?Grooming: Independent ?UB Dressing: Independent ?LB Dressing: Independent ?Toileting: mod I ?Bathing: supervision ?Tub Shower transfers: supervision ?Equipment: Transfer tub bench ? ? ?IADLs: ?Shopping: needs assist ?Light housekeeping: daughter is handling ?Meal Prep: dependent ?Community mobility: mod I ?Medication management: family is double checking  ?Financial management: family is handling ?Handwriting: 100% legible ? ?MOBILITY STATUS: Independent ? ?POSTURE COMMENTS:  ?No Significant postural limitations ? ? ?ACTIVITY TOLERANCE: ?Activity tolerance: denies issues ? ? ?UE ROM   RUE WFLS, LUE limitations  in shoulder only, elbow, wrist and hand WFLS. ? ?Active ROM Right ?10/15/2021 Left ?10/15/2021  ?Shoulder flexion  85* prior to pain  ?Shoulder abduction  75* limited by pain  ?Shoulder adduction    ?Shoulder extension    ?Shoulder internal rotation    ?Shoulder external  rotation    ?Elbow flexion    ?Elbow extension    ?Wrist flexion    ?Wrist extension    ?Wrist ulnar deviation    ?Wrist radial deviation    ?Wrist pronation    ?Wrist supination    ?(Blank rows = not tested) ? ? ?UE MMT:   RUE 5/5, LUE, shoulder not formally tested due to pain, proximal strength at least 4/5 ? ?MMT Right ?10/15/2021 Left ?10/15/2021  ?Shoulder flexion    ?Shoulder abduction    ?Shoulder adduction    ?Shoulder extension    ?Shoulder internal rotation    ?Shoulder external rotation    ?Middle trapezius    ?Lower trapezius    ?Elbow flexion    ?Elbow extension    ?Wrist flexion    ?Wrist extension    ?Wrist ulnar deviation    ?Wrist radial deviation    ?Wrist pronation    ?Wrist supination    ?(Blank rows = not tested) ? ?HAND FUNCTION: ?Grip strength: Right: 57.5 lbs; Left: 44.7 lbs ? ?COORDINATION: ?9 Hole Peg test: Right: 24.75 sec; Left: 27.69 sec ?Box/ Blocks: RUE  55 blocks    LUE 48 blocks ? ?SENSATION: ?WFL ? ?EDEMA: n/a ? ? ? ?COGNITION: ?Overall cognitive status: Impaired: Areas of impairment: Attention, Memory, Safety/judgement, Awareness, and Problem solving  ?Attention: Deficits decreased sustained attention ?Memory: Deficits decreased short term memory ?Awareness: Deficits decreased awareness ?Problem solving: Deficits  ?Executive function:Reasoning and Organizing ? ?VISION: ?Subjective report: Denies visual changes ?Baseline vision: No visual deficits and needs glasses for reading ? ? ?VISION ASSESSMENT: ?To be assessed in a functional context, hx of L negelct and visual deficits ? ? ? ? ? ?OBSERVATIONS: Pt with L shoulder hike during use of L hand . Pt became distracted while performing box/ blocks and started looking out the window. ? ? ? ? ?PATIENT EDUCATION: ?Education details: role of OT ?Person educated: Patient ?Education method: Explanation ?Education comprehension: verbalized understanding ? ? ? ? ? ?GOALS: ? ? ?SHORT TERM GOALS: Target date: 11/12/2021 ? ?I with initial  HEP ? ?Goal status: INITIAL ? ?2.  Pt will demonstrate ability to retrieve a lightweight object at 90* with no more than min compensations and pain no greater than 3/10.Marland Kitchen ?Baseline: 85* ?Goal status: INITIAL ? ?3.  Pt will perform basic home management with supervision demonstrating good safety awareness. ?Baseline:dependent ?Goal status: INITIAL ? ?4.  Pt will perform basic cooking with no more than min v.c/ min A demonstrating good safety awareness. 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 ?5.  Pt will perform a  a simple functional organization task such as generating a menu and grocery  list with     no more than min v.c ? ?Goal status: INITIAL ? ?6.  Pt will increase LUE grip strength by 5 lbs for increased LUE functional use. ?Baseline: 44.7 lbs ?Goal status: INITIAL ? ? 7. Pt will perform all basic ADLS modified independently ? ?LONG TERM GOALS: Target date:01/07/22 ? ?I with updated HEP ? ?Goal status: INITIAL ? ?2.  Pt will perform mod complex home management mod I  ? ?Goal status: INITIAL ? ?3.  Pt will perform mod  complex cooking modified independently demonstrating good safety awareness. ? ?Goal status: INITIAL ? ?4.  Pt will perform simulated work activities mod I ? ?Goal status: INITIAL ? ?5.  Pt will retrieve a light weight object at 110* shoulder flexion with pain no greater than 3/10. ? ?Goal status: INITIAL ?6. Pt will demonstrate ability to perform a physical and cognitive task simultaneously with 90%of better accuracy. ?Goal status: initial  ? ?8. Pt will perform tabletop and environmental scanning tasks with 90% or better accuracy. ?Goal status: Initial ? ?ASSESSMENT: ? ?CLINICAL IMPRESSION: ?Patient is a 51 y.o. female who was seen today for occupational therapy evaluation for ICH. Pt was admitted to [facility] on 09/10/2021 after fall out of bed followed by AMS. CT: significant edema in R frontal, parietal, occipital and temporal lobe with superimposed hemorrhage, . PMH includes  sickle cell trait, anemia., HTN, seizures, aspiration pneumonia, hyponatremia, uterine bleeding  Pt. d/c home on 09/26/21 following CIR.  ? ?PRECAUTIONS: Other: sickle cell ? ?PERFORMANCE DEFICITS in functional skills in

## 2021-10-17 NOTE — Progress Notes (Deleted)
?Subjective:  ? ? April Reilly - 51 y.o. female MRN [id number]  Date of birth: March 22, 1971 ? ?HPI ? ?April Reilly is to establish care and hospital discharge follow-up.  ? ?Current issues and/or concerns: ?HOSPITAL DISCHARGE FOLLOW-UP: ?09/19/2021 - 09/26/2021 at [facility] per MD note: ?Discharge Diagnoses:  ?Principal Problem: ?  Cerebral venous sinus thrombosis ?Active Problems: ?  Seizure ([place]) ?  Iron deficiency anemia ?  Other complicated headache syndrome ?  Hyponatremia ?  Abnormal LFTs ?  Dysfunctional uterine bleeding ? ?Brief HPI:   April Reilly is a 51 y.o. female with history of SS trait, uterine fibroid, abnormal vaginal bleeding x4 months who was admitted on 09/10/2021 with 3-day history of malaise, fall and progressive somnolence.  She was found to have RLL aspiration pneumonia as well as IPH due to right hemorrhagic infarct with edema and right frontal, parietal, occipital and temporal lobes and 9 mm right to left midline shift.  She was started on hypertonic saline and EEG done showing evidence of epileptogenicity with high potential for seizures.  Work-up also revealed subocclusive clot in right distal right transverse sinus and right sigmoid dural venous sinus and she was started on IV heparin.  Dr. Erlinda Hong felt that patient with large right frontoparietal venous infarct with hemorrhagic conversion due to unclear etiology.  Neurosurgery was consulted due to concerns about the herniation and recommended conservative care with serial CCT for monitoring. ?  ?She was loaded with Dilantin due to concerns of seizure and placed on long-term EEG.  Dilantin was discontinued and Keppra added on 02/26.  Hypertonic saline was weaned off and mentation was slowly improving.  Repeat CTA/V of 02/27 showed evolving hemorrhagic infarct with mild improvement in mass effect and improved nonocclusive thrombus in right distal transverse and sigmoid sinus.  She was transition to [place] on 02/28 with neurology  recommending 3 to 6 months of AC as well as close neurological follow-up. Hospital course was significant for issues with headaches, hypotension, tachycardia, left-sided weakness with balance deficits as well as cognitive deficits and disorientation. CIR was recommended due to functional decline. ?  ?  ?Hospital Course: April Reilly was admitted to rehab 09/19/2021 for inpatient therapies to consist of PT, ST and OT at least three hours five days a week. Past admission physiatrist, therapy team and rehab RN have worked together to provide customized collaborative inpatient rehab.  Her blood pressures were monitored on TID basis and have been variable. BB was held briefly then resumed due to upward trend off  medications. Her po intake has been good and she is continent of B/B. She continues on megace for dysfunctional bleeding and was advised to reschedule her appointment with local GYN to evaluation and follow up. ?  ?Follow-up CBC shows acute blood loss anemia to be stable.  Follow-up check of electrolytes showed downward trend in sodium and patient was advised to increase salt intake after discharge as well as repeat labs in 5 to 7 days.  She has been making gains during her stay but patient/family requested shorter LOS. She currently requires supervision  to min assist for safety and decreased awareness of deficits. Family was advised to assist/manage medications after discharge. She will continue to receive follow up outpatient PT, OT and ST at [facility] neuro rehab after discharge.  ?  ?  ?Rehab course: During patient's stay in rehab weekly team conferences were held to monitor patient's progress, set goals and discuss barriers to discharge. At admission, patient required min assist  with mobility and with ADL tasks.  She exhibited cognitive impairments affecting attention, functional problem-solving, recall as well as intellectual awareness of errors.  Cognitive testing revealed  SLUMS score 15/30.  She  has had  improvement in activity tolerance, balance, postural control as well as ability to compensate for deficits.  She requires supervision to complete ADL tasks safely. She requires supervision with verbal cues for transfers and to ambulate 300 feet with verbal cues and use of rolling walker.  She requires contact-guard assist to climb 12 stairs. ?She requires supervision to min assist with cognitive tasks.  Family education was completed with daughter. ?  ?Discharge disposition: 01-Home or Self Care ?  ?Diet: Heart healthy ?  ?Special Instructions: ?1.  No driving for 6 months due to concerns of seizure.  Neurology to clear patient prior to resumption of driving. ?2.  Will need repeat BMET in 5 to 7 days to monitor sodium levels. ?3. Reschedule GYN appointment for follow up on dysfunctional bleeding.  ?4. Avoid asa, asa containing products, NSAIDs and alcohol.  ?  ?Follow-Ups: ?Follow up with Raulkar, April Deutscher, MD (Physical Medicine and Rehabilitation); 02/05/22 please arrive at 2:20pm for 2:40pm appointment, thank you! ?Follow up with [facility]; call for follow up appointment if you have not heard from them by Monday March 13th. ? ? ?10/10/2021 [facility] per MD note: ?51 y.o. year old female with medical history of sickle cell, chronic anemia who is presenting with 3 days of generalized malaise and found to have a large right hemispheric intracerebral hemorrhage due to cerebral venous sinus thrombosis. Patient did have subsequent seizures and brain edema.  She has been anticoagulated initially on heparin and now on DOAC. She is discharged home, continue to improve, she continued to do outpatient physical therapy.  On exam today she did not have any deficit, no visual field cut and no weakness. She continues to do well.  She is concerned about all these medications she is taking because prior to admission she was not taking anything. ?Patient etiology of her venous sinus  thrombosis likely a combination of sickle cell disease and also being on megestrol which can cause blood clots.  At this time, I will discontinue the Fioricet, discontinued the magnesium oxide, discontinue the megestrol acetate, discontinue the melatonin, discontinue the mirtazapine, discontinue the senna, topiramate and tramadol.   ? ?I will continue her on apixaban 5 mg twice daily, Lipitor 40 mg daily, Keppra 500 mg twice daily, metoprolol 12.5 mg twice daily and paroxetine 10 mg daily.  I will see her in 3 months for follow-up, I will obtain a repeat brain imaging prior to her appointment.  If she continues to improve and no seizure, plan will be to discontinue levetiracetam and paroxetine.  I spent extended amount of time going over her diagnosis, brain images and plans.  Both patient and daughter are comfortable with plans. ?  ?  ?1. Other right-sided nontraumatic intracerebral hemorrhage ([place])   ?2. Cerebral venous sinus thrombosis   ?3. Cerebral edema ([facility])   ?4. Seizure ([place])   ?  ?  ?  ?Patient Instructions  ?Continue with Eliquis twice daily  ?Continue with Lipitor 40 mg daily  ?Continue with Keppra 500 mg twice daily  ?Continue Metoprolol Tartrate 25 mg 1/2 tab twice daily  ?Continue with Paroxetine 10 mg daily  ?Return in 3 months  ?  ? ?TODAY'S VISIT: ?Neurology 10/10/2021 ?- Patient etiology of her venous sinus thrombosis likely a combination  of sickle cell disease and also being on megestrol which can cause blood clots.   ?- At this time, I will discontinue the Fioricet, discontinued the magnesium oxide, discontinue the megestrol acetate, discontinue the melatonin, discontinue the mirtazapine, discontinue the senna, topiramate and tramadol.   ?- I will continue her on apixaban 5 mg twice daily, Lipitor 40 mg daily, Keppra 500 mg twice daily, metoprolol 12.5 mg twice daily and paroxetine 10 mg daily.   ?- I will see her in 3 months for follow-up, I will obtain a repeat brain imaging prior to her  appointment.   ?- If she continues to improve and no seizure, plan will be to discontinue levetiracetam and paroxetine.  ? ?- She currently requires supervision to min assist for safety and decreased awareness of deficits. Fam

## 2021-10-19 ENCOUNTER — Ambulatory Visit: Payer: Self-pay

## 2021-10-19 ENCOUNTER — Emergency Department (HOSPITAL_COMMUNITY)
Admit: 2021-10-19 | Discharge: 2021-10-19 | Disposition: A | Discharge status: Home / Self Care | Payer: 59 | Attending: Emergency Medicine | Admitting: Emergency Medicine

## 2021-10-19 ENCOUNTER — Emergency Department (HOSPITAL_COMMUNITY)
Admission: EM | Admit: 2021-10-19 | Discharge: 2021-10-19 | Disposition: A | Discharge status: Home / Self Care | Payer: 59 | Attending: Emergency Medicine | Admitting: Emergency Medicine

## 2021-10-19 ENCOUNTER — Encounter (HOSPITAL_COMMUNITY): Payer: Self-pay | Admitting: Emergency Medicine

## 2021-10-19 ENCOUNTER — Other Ambulatory Visit (HOSPITAL_COMMUNITY): Payer: Self-pay

## 2021-10-19 DIAGNOSIS — M79661 Pain in right lower leg: Secondary | ICD-10-CM | POA: Diagnosis present

## 2021-10-19 DIAGNOSIS — I639 Cerebral infarction, unspecified: Secondary | ICD-10-CM

## 2021-10-19 DIAGNOSIS — I82451 Acute embolism and thrombosis of right peroneal vein: Secondary | ICD-10-CM | POA: Insufficient documentation

## 2021-10-19 LAB — CBC WITH DIFFERENTIAL/PLATELET
Abs Immature Granulocytes: 0.01 10*3/uL (ref 0.00–0.07)
Basophils Absolute: 0 10*3/uL (ref 0.0–0.1)
Basophils Relative: 1 %
Eosinophils Absolute: 0.1 10*3/uL (ref 0.0–0.5)
Eosinophils Relative: 3 %
HCT: 39.8 % (ref 36.0–46.0)
Hemoglobin: 12.5 g/dL (ref 12.0–15.0)
Immature Granulocytes: 0 %
Lymphocytes Relative: 43 %
Lymphs Abs: 1.8 10*3/uL (ref 0.7–4.0)
MCH: 24.7 pg — ABNORMAL LOW (ref 26.0–34.0)
MCHC: 31.4 g/dL (ref 30.0–36.0)
MCV: 78.7 fL — ABNORMAL LOW (ref 80.0–100.0)
Monocytes Absolute: 0.3 10*3/uL (ref 0.1–1.0)
Monocytes Relative: 6 %
Neutro Abs: 2 10*3/uL (ref 1.7–7.7)
Neutrophils Relative %: 47 %
Platelets: 253 10*3/uL (ref 150–400)
RBC: 5.06 MIL/uL (ref 3.87–5.11)
WBC: 4.2 10*3/uL (ref 4.0–10.5)
nRBC: 0 % (ref 0.0–0.2)

## 2021-10-19 LAB — BASIC METABOLIC PANEL
Anion gap: 8 (ref 5–15)
BUN: <5 mg/dL — ABNORMAL LOW (ref 6–20)
CO2: 23 mmol/L (ref 22–32)
Calcium: 9.3 mg/dL (ref 8.9–10.3)
Chloride: 106 mmol/L (ref 98–111)
Creatinine, Ser: 0.76 mg/dL (ref 0.44–1.00)
GFR, Estimated: >60 mL/min (ref >60)
Glucose, Bld: 92 mg/dL (ref 70–99)
Potassium: 3.9 mmol/L (ref 3.5–5.1)
Sodium: 137 mmol/L (ref 135–145)

## 2021-10-19 LAB — PROTIME-INR
INR: 1.3 — ABNORMAL HIGH (ref 0.8–1.2)
Prothrombin Time: 15.8 seconds — ABNORMAL HIGH (ref 11.4–15.2)

## 2021-10-19 MED ORDER — ENOXAPARIN SODIUM 60 MG/0.6ML IJ SOSY: 60.0000 mg | PREFILLED_SYRINGE | Freq: Two times a day (BID) | INTRAMUSCULAR | 0 refills | Status: DC

## 2021-10-19 MED ORDER — TRAMADOL HCL 50 MG PO TABS: 25.0000 mg | ORAL_TABLET | Freq: Two times a day (BID) | ORAL | 0 refills | Status: DC | PRN

## 2021-10-19 MED ORDER — ENOXAPARIN SODIUM 60 MG/0.6ML IJ SOSY
60.0000 mg | PREFILLED_SYRINGE | Freq: Two times a day (BID) | INTRAMUSCULAR | Status: DC
  Administered 2021-10-19: 60 mg via SUBCUTANEOUS
  Filled 2021-10-19 (×2): qty 0.6

## 2021-10-19 MED ORDER — METOPROLOL TARTRATE 25 MG PO TABS: 12.5000 mg | ORAL_TABLET | Freq: Two times a day (BID) | ORAL | 0 refills | Status: DC

## 2021-10-19 MED ORDER — PAROXETINE HCL 10 MG PO TABS: 10.0000 mg | ORAL_TABLET | Freq: Every day | ORAL | 0 refills | Status: DC

## 2021-10-19 MED ORDER — ATORVASTATIN CALCIUM 40 MG PO TABS
40.0000 mg | ORAL_TABLET | Freq: Every day | ORAL | 0 refills | Status: DC
Start: 2021-10-19 — End: 2021-12-24

## 2021-10-19 MED ORDER — LEVETIRACETAM 500 MG PO TABS
500.0000 mg | ORAL_TABLET | Freq: Two times a day (BID) | ORAL | 0 refills | Status: DC
Start: 2021-10-19 — End: 2021-11-26

## 2021-10-19 NOTE — ED Triage Notes (Signed)
Patient complains of a burning sensation in her right calf that started at 0900 this morning, history of blood clots and is currently taking eliquis. Patient also reports numbness in same leg that started at the same time but improved after taking tramadol. Patient alert, oriented, and in no apparent distress at this time. ?

## 2021-10-19 NOTE — Progress Notes (Addendum)
Right lower extremity venous duplex completed. ?Refer to "CV Proc" under chart review to view preliminary results. ? ?Preliminary results discussed with Astrid Drafts, PA-C. ? ?10/19/2021 1:46 PM ?Kelby Aline., MHA, RVT, RDCS, RDMS   ?

## 2021-10-19 NOTE — ED Provider Triage Note (Signed)
Emergency Medicine Provider Triage Evaluation Note ? ?Ronit Cranfield , a 51 y.o. female  was evaluated in triage.  Pt complains of right thigh, calf numbness since this morning.  Patient has history of blood clots, most recently in February.  Patient states she is compliant on Eliquis.  Patient denies any recent trauma or excessive exercise to account for leg pain otherwise. ? ?Review of Systems  ?Positive: Right leg pain, numbness ?Negative: Chest pain, shortness of breath, nausea, vomiting ? ?Physical Exam  ?BP 124/70 (BP Location: Right Arm)   Pulse 65   Temp 98.1 ?F (36.7 ?C) (Oral)   Resp 16   SpO2 98%  ?Gen:   Awake, no distress   ?Resp:  Normal effort  ?MSK:   Moves extremities without difficulty  ?Other:  No overlying skin changes to patient right calf, no ecchymosis, no tenderness to palpation. ? ?Medical Decision Making  ?Medically screening exam initiated at 12:43 PM.  Appropriate orders placed.  Karstyn Birkey was informed that the remainder of the evaluation will be completed by another provider, this initial triage assessment does not replace that evaluation, and the importance of remaining in the ED until their evaluation is complete. ? ? ?  ?Azucena Cecil, PA-C ?10/19/21 1244 ? ?

## 2021-10-19 NOTE — Telephone Encounter (Signed)
Pt's daughter called to report that the patient runs out of her medication today and needs a refill. The patient has a hosp fu scheduled for 10/22/2021. The daughter wants to know if it is safe for the patient to not take her medications until Monday. Please advise  ?Girard Cooter  ?Best contact: [phone]  ? ?Unable to leave a message. ?

## 2021-10-19 NOTE — Discharge Instructions (Addendum)
I have refilled your home medications.  Stop taking the Eliquis that you were on previously.  We have started you on a different blood thinning medication which is a shot.  You were shown how to give this medication while you were here in the emergency department.  I have sent a refill of this medication along with your other medications to the [facility] for [place]. This shot is given twice daily.  You will need to start your first dose at home tomorrow morning. ? ?Keep your follow-up appointment primary care provider on Monday. ? ?Return for new or worsening symptoms such as chest pain, shortness of breath, severe headache ?

## 2021-10-19 NOTE — ED Notes (Signed)
Discharged 20 minutes ago   no time to d/c then ?

## 2021-10-19 NOTE — ED Provider Notes (Signed)
?[facility] ?Provider Note ? ? ?CSN: [id number] ?Arrival date & time: 10/19/21  1220 ? ?  ?History ? ?Chief Complaint  ?Patient presents with  ? Numbness  ? ? ?April Reilly is a 51 y.o. 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  States began earlier today, went into the posterior aspect popliteal fossa right lateral aspect lower extremity.  Did not extend into her groin.  She took her home tramadol which relieved the pain.  She denies headache, blurred vision, chest pain, shortness of breath, back pain, abdominal pain.  No swelling to lower extremities.  States she has been compliant with her Eliquis which she has for cerebral venous sinus thrombosis dx 1 month ago. No recent injury or trauma. Does states that she has been going to physical therapy for generalized deconditioning after inpatient states which she feels she had improved from as far as her weakness is concerned. ? ?States she will be out of these medications today however. She has not pain/ tingling currently. Concern for DVT, came here to ED. ? ?Daughter here today, helps patient at home. ? ?HPI ? ?  ? ?Home Medications ?Prior to Admission medications   ?Medication Sig Start Date End Date Taking? Authorizing Provider  ?enoxaparin (LOVENOX) 60 MG/0.6ML injection Inject 0.6 mLs (60 mg total) into the skin every 12 (twelve) hours. 10/19/21 11/18/21 Yes Brendolyn Stockley A, PA-C  ?atorvastatin (LIPITOR) 40 MG tablet Take 1 tablet (40 mg total) by mouth daily. 10/19/21   Delaine Canter A, PA-C  ?levETIRAcetam (KEPPRA) 500 MG tablet Take 1 tablet (500 mg total) by mouth 2 (two) times daily. 10/19/21   Alivia Cimino A, PA-C  ?metoprolol tartrate (LOPRESSOR) 25 MG tablet Take 1/2 tablet (12.5 mg total) by mouth 2 (two) times daily. 10/19/21   Cinnamon Morency A, PA-C  ?PARoxetine (PAXIL) 10 MG tablet Take 1 tablet (10 mg total) by mouth  daily. 10/19/21   Nyelle Wolfson A, PA-C  ?traMADol (ULTRAM) 50 MG tablet Take 0.5 tablets (25 mg total) by mouth 2 (two) times daily as needed for moderate pain. 10/19/21   Earvin Blazier A, PA-C  ?   ? ?Allergies    ?Megace [megestrol]   ? ?Review of Systems   ?Review of Systems  ?Constitutional: Negative.   ?HENT: Negative.    ?Respiratory: Negative.    ?Cardiovascular: Negative.   ?Gastrointestinal: Negative.   ?Genitourinary: Negative.   ?Musculoskeletal:   ?     Right lateral calf pain  ?Skin: Negative.   ?Neurological: Negative.   ?All other systems reviewed and are negative. ? ?Physical Exam ?Updated Vital Signs ?BP 124/70 (BP Location: Right Arm)   Pulse 65   Temp 98.1 ?F (36.7 ?C) (Oral)   Resp 16   SpO2 98%  ?Physical Exam ?Vitals and nursing note reviewed.  ?Constitutional:   ?   General: She is not in acute distress. ?   Appearance: She is well-developed. She is not ill-appearing, toxic-appearing or diaphoretic.  ?HENT:  ?   Head: Normocephalic and atraumatic.  ?   Mouth/Throat:  ?   Mouth: Mucous membranes are moist.  ?Eyes:  ?   Pupils: Pupils are equal, round, and reactive to light.  ?Cardiovascular:  ?   Rate and Rhythm: Normal rate.  ?   Pulses: Normal pulses.     ?     Radial pulses are 2+ on the  right side and 2+ on the left side.  ?     Femoral pulses are 2+ on the right side and 2+ on the left side. ?     Popliteal pulses are 2+ on the right side and 2+ on the left side.  ?     Dorsalis pedis pulses are 2+ on the right side and 2+ on the left side.  ?     Posterior tibial pulses are 2+ on the right side and 2+ on the left side.  ?   Heart sounds: Normal heart sounds.  ?Pulmonary:  ?   Effort: Pulmonary effort is normal. No respiratory distress.  ?   Breath sounds: Normal breath sounds and air entry. No stridor, decreased air movement or transmitted upper airway sounds.  ?   Comments: Clear bilaterally, speaks in full sentences without difficulty. ?Chest:  ?   Comments: Non  tender ?Abdominal:  ?   General: Bowel sounds are normal. There is no distension.  ?   Palpations: Abdomen is soft.  ?   Tenderness: There is no abdominal tenderness.  ?   Comments: Soft, nontender, no rebound or guarding  ?Musculoskeletal:     ?   General: Normal range of motion.  ?   Cervical back: Normal range of motion.  ?   Comments: No bony tenderness, compartments soft.  Full range of motion bilateral lower extremities, no midline C/T/L tenderness.  Negative straight leg raise.  Equal pulses bilaterally.  ?Skin: ?   General: Skin is warm and dry.  ?   Capillary Refill: Capillary refill takes less than 2 seconds.  ?   Comments: No edema, erythema, warmth, crepitus  ?Neurological:  ?   General: No focal deficit present.  ?   Mental Status: She is alert.  ?   Comments: Intact sensation to BLE ?Ambulatory  ?Psychiatric:     ?   Mood and Affect: Mood normal.  ? ? ?ED Results / Procedures / Treatments   ?Labs ?(all labs ordered are listed, but only abnormal results are displayed) ?Labs Reviewed  ?PROTIME-INR - Abnormal; Notable for the following components:  ?    Result Value  ? Prothrombin Time 15.8 (*)   ? INR 1.3 (*)   ? All other components within normal limits  ?CBC WITH DIFFERENTIAL/PLATELET - Abnormal; Notable for the following components:  ? MCV 78.7 (*)   ? MCH 24.7 (*)   ? All other components within normal limits  ?BASIC METABOLIC PANEL - Abnormal; Notable for the following components:  ? BUN <5 (*)   ? All other components within normal limits  ? ? ?EKG ?None ? ?Radiology ?VAS [place] LOWER EXTREMITY VENOUS (DVT) (7a-7p) ? ?Result Date: 10/19/2021 ? Lower Venous DVT Study Patient Name:  TANISE RUSSMAN  Date of Exam:   10/19/2021 Medical Rec #: [id number]      Accession #:    [id number] Date of Birth: 04/17/1971      Patient Gender: F Patient Age:   76 years Exam Location:  [facility] Procedure:      VAS [place] LOWER EXTREMITY VENOUS (DVT) Referring Phys: Genevive Bi  --------------------------------------------------------------------------------  Indications: Stroke, and History of cerebral thrombus. Recent pain and burning of right calf.  Comparison Study: 09/12/2021- negative bilateral lower extremity venous duplex Performing Technologist: Maudry Mayhew MHA, RDMS, RVT, RDCS  Examination Guidelines: A complete evaluation includes B-mode imaging, spectral Doppler, color Doppler, and power Doppler as needed of all accessible portions of each vessel.  Bilateral testing is considered an integral part of a complete examination. Limited examinations for reoccurring indications may be performed as noted. The reflux portion of the exam is performed with the patient in reverse Trendelenburg.  +---------+---------------+---------+-----------+----------+-----------------+ RIGHT    CompressibilityPhasicitySpontaneityPropertiesThrombus Aging    +---------+---------------+---------+-----------+----------+-----------------+ CFV      Full           Yes      Yes                                    +---------+---------------+---------+-----------+----------+-----------------+ SFJ      Full                                                           +---------+---------------+---------+-----------+----------+-----------------+ FV Prox  Full                                                           +---------+---------------+---------+-----------+----------+-----------------+ FV Mid   Full                                                           +---------+---------------+---------+-----------+----------+-----------------+ FV DistalFull                                                           +---------+---------------+---------+-----------+----------+-----------------+ PFV      Full                                                           +---------+---------------+---------+-----------+----------+-----------------+ POP      Full            Yes      Yes                                    +---------+---------------+---------+-----------+----------+-----------------+ PTV      None                    No                   Age Indeterminate +---------+---------------+---------+-----------+----------+-----------------+ PERO     None

## 2021-10-19 NOTE — Telephone Encounter (Signed)
No answer again, voicemail full. ?

## 2021-10-19 NOTE — Telephone Encounter (Signed)
Pt had called for medication advice, out of post hospital meds but post hospital appt not until Monday. Noted on chart that pt is now in the ED. ?

## 2021-10-22 ENCOUNTER — Inpatient Hospital Stay: Payer: 59 | Admitting: Family

## 2021-10-22 ENCOUNTER — Encounter: Payer: 59 | Admitting: Family

## 2021-10-22 DIAGNOSIS — R7989 Other specified abnormal findings of blood chemistry: Secondary | ICD-10-CM

## 2021-10-22 DIAGNOSIS — Z09 Encounter for follow-up examination after completed treatment for conditions other than malignant neoplasm: Secondary | ICD-10-CM

## 2021-10-22 DIAGNOSIS — R569 Unspecified convulsions: Secondary | ICD-10-CM

## 2021-10-22 DIAGNOSIS — I10 Essential (primary) hypertension: Secondary | ICD-10-CM

## 2021-10-22 DIAGNOSIS — N938 Other specified abnormal uterine and vaginal bleeding: Secondary | ICD-10-CM

## 2021-10-22 DIAGNOSIS — G08 Intracranial and intraspinal phlebitis and thrombophlebitis: Secondary | ICD-10-CM

## 2021-10-22 DIAGNOSIS — D509 Iron deficiency anemia, unspecified: Secondary | ICD-10-CM

## 2021-10-22 DIAGNOSIS — Z7689 Persons encountering health services in other specified circumstances: Secondary | ICD-10-CM

## 2021-10-22 DIAGNOSIS — G4459 Other complicated headache syndrome: Secondary | ICD-10-CM

## 2021-10-22 DIAGNOSIS — E871 Hypo-osmolality and hyponatremia: Secondary | ICD-10-CM

## 2021-10-22 NOTE — Progress Notes (Signed)
Erroneous encounter

## 2021-10-23 ENCOUNTER — Ambulatory Visit: Payer: 59 | Admitting: Occupational Therapy

## 2021-10-23 ENCOUNTER — Ambulatory Visit: Payer: 59 | Attending: Physical Medicine and Rehabilitation

## 2021-10-23 DIAGNOSIS — R278 Other lack of coordination: Secondary | ICD-10-CM | POA: Diagnosis present

## 2021-10-23 DIAGNOSIS — R2681 Unsteadiness on feet: Secondary | ICD-10-CM | POA: Insufficient documentation

## 2021-10-23 DIAGNOSIS — R41844 Frontal lobe and executive function deficit: Secondary | ICD-10-CM

## 2021-10-23 DIAGNOSIS — R41840 Attention and concentration deficit: Secondary | ICD-10-CM | POA: Diagnosis present

## 2021-10-23 DIAGNOSIS — R41841 Cognitive communication deficit: Secondary | ICD-10-CM | POA: Insufficient documentation

## 2021-10-23 DIAGNOSIS — M25512 Pain in left shoulder: Secondary | ICD-10-CM | POA: Insufficient documentation

## 2021-10-23 DIAGNOSIS — M6281 Muscle weakness (generalized): Secondary | ICD-10-CM

## 2021-10-23 DIAGNOSIS — G08 Intracranial and intraspinal phlebitis and thrombophlebitis: Secondary | ICD-10-CM | POA: Diagnosis present

## 2021-10-23 NOTE — Therapy (Signed)
?OUTPATIENT SPEECH LANGUAGE PATHOLOGY TREATMENT NOTE ? ? ?Patient Name: April Reilly ?MRN: [id number] ?DOB:1971/05/09, 51 y.o., female ?Today's Date: 10/23/2021 ? ?PCP: Pcp, No ?REFERRING PROVIDER: Izora Ribas, MD ? ?END OF SESSION:  ? End of Session - 10/23/21 1528   ? ? Visit Number 2   ? Number of Visits 17   ? Date for SLP Re-Evaluation 12/14/21   ? Authorization Type [facility]   ? Authorization Time Period needs auth after 25th ST visit   ? SLP Start Time 1534   ? SLP Stop Time  1615   ? SLP Time Calculation (min) 41 min   ? Activity Tolerance Patient tolerated treatment well   ? ?  ?  ? ?  ? ? ?Past Medical History:  ?Diagnosis Date  ? Asthma   ? Vaginal bleeding, abnormal   ? for 4 months  ? ?Past Surgical History:  ?Procedure Laterality Date  ? FOOT SURGERY    ? TUBAL LIGATION    ? ?Patient Active Problem List  ? Diagnosis Date Noted  ? 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 Hyponatremia 09/26/2021  ? Abnormal LFTs 09/26/2021  ? Dysfunctional uterine bleeding 09/26/2021  ? Uterine fibroid 09/19/2021  ? 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 Essential hypertension 09/16/2021  ? Hyperlipidemia 09/16/2021  ? Seizure ([place]) 09/16/2021  ? Aspiration pneumonia ([place]) 09/16/2021  ? Sickle cell disease ([place]) 09/16/2021  ? Iron deficiency anemia 09/16/2021  ? Cerebral edema ([place]) 09/10/2021  ? Acute metabolic encephalopathy 25/95/6387  ? ICH (intracerebral hemorrhage) ([place]) 09/10/2021  ? ? ?ONSET DATE: 09-10-21 ? ?REFERRING DIAG: R-CVA  ? ?THERAPY DIAG: Cognitive communication deficit ? ?SUBJECTIVE: "I now have to do injections at 6 AM and 6 PM" ? ?PAIN:  ?Are you having pain? No ? ?OBJECTIVE:  ? ?TODAY'S TREATMENT:  ?10-23-21: Pt reported recent confusion with managing appointments (showing up wrong date, difficulty managing various reminders) and difficulty recognizing previously familiar words while reading (vision deficits also indicated). CLQT completed today with the following deficits: severe  executive functioning; moderate memory, attention, and visuospatial; & WNL language. Pt exhibited slowed processing (often finished tasks outside targeted timeframe) and delayed/reduced error awareness. Cognitive Function PROM completed with score of 109. Pt reported "often" trouble concentrating, difficulty managing time, and reading something several times to understand it and reported "sometimes" trouble keeping track if interrupted, working hard to pay attention, difficulty remembering without writing down, and trouble keeping appointments not part of regular routine. Some prior difficulty with attention and memory reported as pt felt easily distracted and occasionally forgetful. Plan to address appointment management and memory/attention compensations next session.  ? ?10-15-21: Educated patient on clinical observations and analysis of completed CLQT subtests. Decreased attention (sustained, focused, alternating), reduced short term recall (story retell, complex instructions) and impaired executive functioning (error awareness, problem solving, slow processing) resulted in missed overt errors, difficulty recalling and completing more complex instructions, and maintaining attention. Pt verbalized understanding of SLP education and agreeable for initiation of ST services to address cognitive linguistic skills as pt would like to return to baseline (independent management of ADLs/iADLs and return to work full time).  ?  ?  ?PATIENT EDUCATION: ?Education details: see above ?Person educated: Patient ?Education method: Explanation and Demonstration ?Education comprehension: verbalized understanding, returned demonstration, and needs further education ?  ?  ?  ?  ?GOALS: ?Goals reviewed with patient? Yes ?  ?SHORT TERM GOALS: Target date: 11/16/2021 ?  ?Pt will complete CLQT and PROM in  first 1-2 ST sessions  ?Baseline:  ?Goal status: MET ?  ?2.  Pt will recall and implement 2 attention/memory compensations to aid daily  functioning and completion of iADLs with occasional min A over 2 sessions ?Baseline:  ?Goal status: ONGOING ?  ?3.  Pt will use phone and/or calendar for orientation/time management given rare min A over 2 sessions  ?Baseline:  ?Goal status: ONGOING ?  ?4. Pt will complete functional iADL tasks (medication/financial management tasks) with 80% accuracy given occasional min A over 2 sessions  ?Baseline:  ?Goal status: ONGOING ?  ?5.  Pt will maintain sustained and focused attention for 15+ minutes for cognitive linguistic tasks given occasional min A over 2 sessions  ?Baseline:  ?Goal status: ONGOING ?  ?  ?LONG TERM GOALS: Target date: 12/14/2021 ?  ?Pt will recall and implement 4 attention/memory compensations to aid daily functioning and completion of iADLs with rare min A over 2 sessions ?Baseline:  ?Goal status: ONGOING ?  ?2.  Pt will complete functional iADL tasks (medication/financial management tasks) at home given rare min A over 2 sessions  ?Baseline:  ?Goal status: ONGOING ?  ?3.  Pt will maintain sustained and focused attention for 30+ minutes for cognitive linguistic tasks given rare min A over 2 sessions  ?Baseline:  ?Goal status: ONGOING ?  ?4.  Pt will report improved cognitive linguistic functioning via PROM by 2 points at last ST session  ?Baseline: CF=109 ?Goal status: ONGOING ?  ?ASSESSMENT: ?  ?CLINICAL IMPRESSION: ?Patient is a 51 y.o. 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 Pt only partly aware of functional impact of current cognitive linguistic deficits (difficulty recalling DOW - texting family for answer). Pt agreeable to initiation of ST services to optimize return to baseline.  ?  ?OBJECTIVE IMPAIRMENTS include attention, memory, awareness,  and executive functioning. These impairments are limiting patient from managing medications, managing appointments, household responsibilities, and ADLs/IADLs. ?Factors affecting potential to achieve goals and functional outcome are family/community support.. Patient will benefit from skilled SLP services to address above impairments and improve overall function. ?  ?REHAB POTENTIAL: Good ?  ?PLAN: ?SLP FREQUENCY: 2x/week ?  ?SLP DURATION: 8 weeks ?  ?PLANNED INTERVENTIONS: Cognitive reorganization, Internal/external aids, Functional tasks, SLP instruction and feedback, Compensatory strategies, and Patient/family education ? ? ?Marzetta Board, CCC-SLP ?10/23/2021, 5:52 PM ? ? ?

## 2021-10-23 NOTE — Therapy (Signed)
? ?OUTPATIENT OCCUPATIONAL THERAPY NEURO EVALUATION ? ?Patient Name: April Reilly ?MRN: [id number] ?DOB:[birth year], 51 y.o., female ?Today's Date: 10/23/2021 ? ?PCP: Pcp, No ?REFERRING PROVIDER: Izora Ribas, MD ? ? OT End of Session - 10/23/21 1508   ? ? Visit Number 2   ? Number of Visits 25   ? Date for OT Re-Evaluation 01/07/22   ? OT Start Time 1452   ? OT Stop Time 1530   ? OT Time Calculation (min) 38 min   ? Activity Tolerance Patient tolerated treatment well   ? Behavior During Therapy [facility] for tasks assessed/performed;Restless   ? ?  ?  ? ?  ? ? ?Past Medical History:  ?Diagnosis Date  ? Asthma   ? Vaginal bleeding, abnormal   ? for 4 months  ? ?Past Surgical History:  ?Procedure Laterality Date  ? FOOT SURGERY    ? TUBAL LIGATION    ? ?Patient Active Problem List  ? Diagnosis Date Noted  ? 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 Hyponatremia 09/26/2021  ? Abnormal LFTs 09/26/2021  ? Dysfunctional uterine bleeding 09/26/2021  ? Uterine fibroid 09/19/2021  ? 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 Essential hypertension 09/16/2021  ? Hyperlipidemia 09/16/2021  ? Seizure ([place]) 09/16/2021  ? Aspiration pneumonia ([place]) 09/16/2021  ? Sickle cell disease ([place]) 09/16/2021  ? Iron deficiency anemia 09/16/2021  ? Cerebral edema ([place]) 09/10/2021  ? Acute metabolic encephalopathy 38/18/2993  ? ICH (intracerebral hemorrhage) ([place]) 09/10/2021  ? ? ?ONSET DATE: 09/10/21 ? ?REFERRING DIAG: ICH-R frontal, parietal, occipital and temporal hemorrhage ? ?THERAPY DIAG:  ?Frontal lobe and executive function deficit ? ?Attention and concentration deficit ? ?Muscle weakness (generalized) ? ?Other lack of coordination ? ?Unsteadiness on feet ? ?Cerebral venous sinus thrombosis ? ?Acute pain of left shoulder ? ?SUBJECTIVE:  ? ?SUBJECTIVE STATEMENT: ?Pt reports left shoulder pain ?Pt accompanied by: self ? ?PERTINENT HISTORY: 51 y.o. female admitted to [facility] on 09/10/2021 after fall out of bed followed by AMS. CT:  significant edema in R frontal, parietal, occipital and temporal lobe with superimposed hemorrhage, . PMH includes sickle cell trait, anemia. Pt. d/c home on 09/26/21 following CIR.  ? ?PRECAUTIONS: Other: sickle cell , seizures, no driving, no work ? ?WEIGHT BEARING RESTRICTIONS No ? ?PAIN:  ?Are you having pain? Yes: NPRS scale: 8/10 ?Pain location: L shoulder  ?Pain description: aching ?Aggravating factors: movement ?Relieving factors: Not moving it  ? ?FALLS: Has patient fallen in last 6 months? No,  ? ?LIVING ENVIRONMENT: ?Lives with: alone ?Lives in: House/apartment ?Stairs: Yes: External: 3 steps; none ?Has following equipment at home: Shower bench ? ?PLOF: Independent, head chef at [employer] ? ? ?PATIENT GOALS decrease shoulder pain ? ? ?Treatment: Education regarding HEP- see pt instructions ?Tabletop scanning: 77M with 2 errors on left side,  ?number cancellation 31M with 100% accuracy , pt reports she slowed down after becoming aware of       ?missing items in first activity ? ? ? ? ? ?PATIENT EDUCATION: ?Education details: initial HEP in supine, see pt instructions, bed positioning for pain reduction sleeping on left side ?Person educated: Patient ?Education method: Explanation, demonstration, handout ?Education comprehension: verbalized understanding, returned demonstration, verbal and tactile cues ? ? ? ?SHORT TERM GOALS: Target date: 11/20/2021 ? ?I with initial HEP ? ?Goal status: INITIAL ? ?2.  Pt will demonstrate ability to retrieve a lightweight object at 90* with no more than min compensations and pain no greater than 3/10.Marland Kitchen ?Baseline: 85* ?Goal  status: INITIAL ? ?3.  Pt will perform basic home management with supervision demonstrating good safety awareness. ?Baseline:dependent ?Goal status: INITIAL ? ?4.  Pt will perform basic cooking with no more than min v.c/ min A demonstrating good safety awareness. 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 ?5.  Pt will perform a  a simple functional  organization task such as generating a menu and grocery  list with     no more than min v.c ? ?Goal status: INITIAL ? ?6.  Pt will increase LUE grip strength by 5 lbs for increased LUE functional use. ?Baseline: 44.7 lbs ?Goal status: INITIAL ? ? 7. Pt will perform all basic ADLS modified independently ? ?LONG TERM GOALS: Target date:01/07/22 ? ?I with updated HEP ? ?Goal status: INITIAL ? ?2.  Pt will perform mod complex home management mod I  ? ?Goal status: INITIAL ? ?3.  Pt will perform mod complex cooking modified independently demonstrating good safety awareness. ? ?Goal status: INITIAL ? ?4.  Pt will perform simulated work activities mod I ? ?Goal status: INITIAL ? ?5.  Pt will retrieve a light weight object at 110* shoulder flexion with pain no greater than 3/10. ? ?Goal status: INITIAL ?6. Pt will demonstrate ability to perform a physical and cognitive task simultaneously with 90%of better accuracy. ?Goal status: initial  ? ?8. Pt will perform tabletop and environmental scanning tasks with 90% or better accuracy. ?Goal status: Initial ? ?ASSESSMENT: ? ?CLINICAL IMPRESSION: ?Patient is a 51 y.o. female who was seen today for occupational therapy evaluation for ICH. Pt was admitted to [facility] on 09/10/2021 after fall out of bed followed by AMS. CT: significant edema in R frontal, parietal, occipital and temporal lobe with superimposed hemorrhage, . PMH includes sickle cell trait, anemia., HTN, seizures, aspiration pneumonia, hyponatremia, uterine bleeding  Pt. d/c home on 09/26/21 following CIR.  ? ?PRECAUTIONS: Other: sickle cell ? ?PERFORMANCE DEFICITS in functional skills including ADLs, IADLs, coordination, dexterity, ROM, strength, pain, flexibility, FMC, GMC, mobility, balance, decreased knowledge of precautions, decreased knowledge of use of DME, and UE functional use, cognitive skills including attention, energy/drive, learn, memory, problem solving, safety awareness, sequencing, thought, and understand, and  psychosocial skills including coping strategies and routines and behaviors.  ? ?IMPAIRMENTS are limiting patient from ADLs, IADLs, rest and sleep, work, play, leisure, and social participation.  ? ?COMORBIDITIES may have co-morbidities  that affects occupational performance. Patient will benefit from skilled OT to address above impairments and improve overall function. ? ?MODIFICATION OR ASSISTANCE TO COMPLETE EVALUATION: No modification of tasks or assist necessary to complete an evaluation. ? ?OT OCCUPATIONAL PROFILE AND HISTORY: Detailed assessment: Review of records and additional review of physical, cognitive, psychosocial history related to current functional performance. ? ?CLINICAL DECISION MAKING: LOW - limited treatment options, no task modification necessary ? ?REHAB POTENTIAL: Good ? ?EVALUATION COMPLEXITY: Moderate ? ? ? ?PLAN: ?OT FREQUENCY: 2x/week ? ?OT DURATION: 12 weeks plus eval- (POC written for 12 weeks to account for scheduling, anticipate d/c after 8 weeks dependent on progress.) ? ?PLANNED INTERVENTIONS: self care/ADL training, therapeutic exercise, therapeutic activity, neuromuscular re-education, manual therapy, passive range of motion, balance training, functional mobility training, aquatic therapy, electrical stimulation, ultrasound, paraffin, fluidotherapy, moist heat, cryotherapy, patient/family education, cognitive remediation/compensation, visual/perceptual remediation/compensation, energy conservation, coping strategies training, and DME and/or AE instructions ? ?RECOMMENDED OTHER SERVICES: PT, ST ? ?CONSULTED AND AGREED WITH PLAN OF CARE: Patient ? ?PLAN FOR NEXT SESSION: review HEP, environmental scanning ? ? ?Mekenzie Modeste,  OT ?10/23/2021, 3:49 PM ?Theone Murdoch, OTR/L ?[phone] ?Phone: [phone] ?3:49 PM 10/23/21  ? ? ? ? ? ? ?   ?

## 2021-10-23 NOTE — Patient Instructions (Addendum)
ELBOW: Extension / Chest Press (Frame) ? ? ? ?Lie on back with knees bent. Straighten elbows to raise frame. Hold __5_ seconds. Use paper towel roll  __10_ reps per set, __2_ sets per day, _7__ days per week ?Use steering wheel or hula hoop instead of frame. ? ? ? ? ?Shoulder: Flexion (Supine) ? ? ? ?With hands shoulder width apart, raise paper towel roll, keep shoulders down against bed Do not let elbows bend. Keep back flat. ?Hold __5__ seconds. Repeat _10___ times. Do __2__ sessions per day. ?CAUTION: Stretch slowly and gently. ? ?C ?

## 2021-10-24 ENCOUNTER — Ambulatory Visit: Payer: 59

## 2021-10-24 ENCOUNTER — Ambulatory Visit: Payer: 59 | Admitting: Physical Therapy

## 2021-10-24 ENCOUNTER — Ambulatory Visit: Payer: 59 | Admitting: Occupational Therapy

## 2021-10-24 ENCOUNTER — Encounter: Payer: Self-pay | Admitting: Occupational Therapy

## 2021-10-24 VITALS — BP 106/75

## 2021-10-24 DIAGNOSIS — R41841 Cognitive communication deficit: Secondary | ICD-10-CM | POA: Diagnosis not present

## 2021-10-24 DIAGNOSIS — M6281 Muscle weakness (generalized): Secondary | ICD-10-CM

## 2021-10-24 DIAGNOSIS — R2681 Unsteadiness on feet: Secondary | ICD-10-CM

## 2021-10-24 DIAGNOSIS — R41840 Attention and concentration deficit: Secondary | ICD-10-CM

## 2021-10-24 DIAGNOSIS — R41844 Frontal lobe and executive function deficit: Secondary | ICD-10-CM

## 2021-10-24 DIAGNOSIS — R278 Other lack of coordination: Secondary | ICD-10-CM

## 2021-10-24 NOTE — Therapy (Signed)
?OUTPATIENT PHYSICAL THERAPY TREATMENT NOTE - DISCHARGE SUMMARY ? ? ?Patient Name: April Reilly ?MRN: [id number] ?DOB:Jan 13, 1971, 51 y.o., female ?Today's Date: 10/24/2021 ? ?PCP: Pcp, No ?REFERRING PROVIDER: Izora Ribas, MD ? ? ?PHYSICAL THERAPY DISCHARGE SUMMARY ? ?Visits from Start of Care: 2 ? ?Current functional level related to goals / functional outcomes: ?See below  ?  ?Remaining deficits: ?Global deconditioning ?  ?Education / Equipment: ?Walking program, HEP with emphasis on BLE strength and narrow BOS  ? ?Patient agrees to discharge. Patient goals were met. Patient is being discharged due to meeting the stated rehab goals.  ? ? ?END OF SESSION:  ? PT End of Session - 10/24/21 0936   ? ? Visit Number 2   ? Number of Visits 5   plus eval  ? Date for PT Re-Evaluation 12/10/21   ? Authorization Type [facility]   ? PT Start Time [id number]   ? PT Stop Time 1016   ? PT Time Calculation (min) 42 min   ? Activity Tolerance Patient tolerated treatment well   ? Behavior During Therapy [facility] for tasks assessed/performed   ? ?  ?  ? ?  ? ? ?Past Medical History:  ?Diagnosis Date  ? Asthma   ? Vaginal bleeding, abnormal   ? for 4 months  ? ?Past Surgical History:  ?Procedure Laterality Date  ? FOOT SURGERY    ? TUBAL LIGATION    ? ?Patient Active Problem List  ? Diagnosis Date Noted  ? 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 Hyponatremia 09/26/2021  ? Abnormal LFTs 09/26/2021  ? Dysfunctional uterine bleeding 09/26/2021  ? Uterine fibroid 09/19/2021  ? 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 Essential hypertension 09/16/2021  ? Hyperlipidemia 09/16/2021  ? Seizure ([place]) 09/16/2021  ? Aspiration pneumonia ([place]) 09/16/2021  ? Sickle cell disease ([place]) 09/16/2021  ? Iron deficiency anemia 09/16/2021  ? Cerebral edema ([place]) 09/10/2021  ? Acute metabolic encephalopathy 16/60/6301  ? ICH (intracerebral hemorrhage) ([place]) 09/10/2021  ? ? ?REFERRING DIAG: R-CVA  ? ?THERAPY DIAG:  ?Muscle weakness  (generalized) ? ?Unsteadiness on feet ? ?Other lack of coordination ? ?PERTINENT HISTORY: CTA/V of 02/27 showed evolving hemorrhagic infarct with mild improvement in mass effect and improved nonocclusive thrombus in right distal transverse and sigmoid sinus.   ? ?PRECAUTIONS: Fall, No driving for 6 months due to concerns of seizure. Neurology to clear patient prior to resumption of driving.  ? ?SUBJECTIVE: Pt reports she is not feeling well today, woke up with a headache. Went to ED last week and did have DVT in R calf, has been taking Lovenox injections at home.  ? ?PAIN:  ?Are you having pain? Yes: NPRS scale: 6/10 ?Pain location: head ?Pain description: throb/ache ?Aggravating factors: none ?Relieving factors: Meds ?  ?OBJECTIVE:  ?  ?DIAGNOSTIC FINDINGS: CTA/V of 02/27 showed evolving hemorrhagic infarct with mild improvement in mass effect and improved nonocclusive thrombus in right distal transverse and sigmoid sinus  ? ?  ?TODAY'S TREATMENT:  ?Ther Act ? ? [place] PT Assessment - 10/24/21 0941   ? ?  ? Transfers  ? Five time sit to stand comments  9.28s without BUE support   ?  ? Functional Gait  Assessment  ? Gait assessed  Yes   ? Gait Level Surface Walks 20 ft in less than 7 sec but greater than 5.5 sec, uses assistive device, slower speed, mild gait deviations, or deviates 6-10 in outside of the 12 in walkway width.  5.6s  ? Change in Gait Speed Able to smoothly change walking speed without loss of balance or gait deviation. Deviate no more than 6 in outside of the 12 in walkway width.   ? Gait with Horizontal Head Turns Performs head turns smoothly with no change in gait. Deviates no more than 6 in outside 12 in walkway width   ? Gait with Vertical Head Turns Performs head turns with no change in gait. Deviates no more than 6 in outside 12 in walkway width.   ? Gait and Pivot Turn Pivot turns safely within 3 sec and stops quickly with no loss of balance.   ? Step Over Obstacle Is able to step over 2  stacked shoe boxes taped together (9 in total height) without changing gait speed. No evidence of imbalance.   ? Gait with Narrow Base of Support Ambulates 4-7 steps.   ? Gait with Eyes Closed Walks 20 ft, uses assistive device, slower speed, mild gait deviations, deviates 6-10 in outside 12 in walkway width. Ambulates 20 ft in less than 9 sec but greater than 7 sec.   ? Ambulating Backwards Walks 20 ft, uses assistive device, slower speed, mild gait deviations, deviates 6-10 in outside 12 in walkway width.   ? Steps Alternating feet, no rail.   ? Total Score 25   ? FGA comment: No AD, low fall risk   ? ?  ?  ? ?  ? ?Established initial/final HEP for BLE strength and narrow BOS. Pt does not require PT services at this time due to score of 25/30 on FGA and no demonstration of gait impairments. Encouraged pt to partake in walking program for improved balance and confidence in gait. Pt in agreement that she does not require PT services at this time.  ?  ?  ?PATIENT EDUCATION: ?Education details: No need for PT at this time, HEP, walking program at home.  ?Person educated: Patient ?Education method: Explanation and Demonstration ?Education comprehension: verbalized understanding and needs further education ?  ?  ?HOME EXERCISE PROGRAM: ?Access Code: [email or web address] ?URL: [email or web address] ?Date: 10/24/2021 ?Prepared by: Mickie Bail Kohana Amble ? ?Exercises ?- Heel to toe walking   - 1 x daily - 7 x weekly - 3 sets - 10 reps ?- Backward heel to toe walking   - 1 x daily - 7 x weekly - 3 sets - 10 reps ?- Side Stepping with Resistance at Thighs and Counter Support  - 1 x daily - 7 x weekly - 3 sets - 10 reps ?- Forward Backward Monster Walk with Band at [employer] and [employer]  - 1 x daily - 7 x weekly - 3 sets - 10 reps ?- Squat with Chair Touch and Resistance Loop  - 1 x daily - 7 x weekly - 3 sets - 10 reps ?  ?  ?  ?GOALS: ?Goals reviewed with patient? Yes ?  ?SHORT TERM GOALS = LONG TERM GOALS DUE TO POC LENGTH  Target date: 11/12/2021 ?  ?Pt will be independent with final HEP for improved strength, balance, transfers and gait. ?  ?Baseline:  ?Goal status: MET  ?  ?2.  Update goal when FGA performed   ?  ?Baseline: pt scored 25/30 at baseline, so no goal set  ?Goal status: Discontinued  ?  ?3.  Pt will improve 5 x STS to less than or equal to 10 seconds without BUE support to demonstrate improved functional strength and transfer efficiency.  ?  ?Baseline: 12.29s without BUE  support; 9.28s without BUE support  ?Goal status: MET ?  ?  ?ASSESSMENT: ?  ?CLINICAL IMPRESSION: ?Emphasis of skilled PT session on DC plan and establishing HEP for carryover of functional gains made in therapy. Pt has met 2 of 2 LTGs and obtained a 25/30 on FGA without AD, indicative of low fall risk. Encouraged pt to begin walking program at home for improved endurance and confidence with gait. Pt in agreement that she does not require PT services at this time. Encouraged pt to obtain a new PT referral if her mobility changes in the future.  ?  ?OBJECTIVE IMPAIRMENTS decreased activity tolerance, decreased cognition, decreased endurance, decreased knowledge of condition, decreased safety awareness, and impaired perceived functional ability.  ?  ?ACTIVITY LIMITATIONS driving, meal prep, and occupation.  ?  ?PERSONAL FACTORS 1-2 comorbidities: seizure precautions and impaired safety awareness  are also affecting patient's functional outcome.  ?  ?  ?REHAB POTENTIAL: Good ?  ?CLINICAL DECISION MAKING: Evolving/moderate complexity ?  ?EVALUATION COMPLEXITY: Moderate ?  ?PLAN: ?PT FREQUENCY: 1x/week ?  ?PT DURATION: 6 weeks ?  ?PLANNED INTERVENTIONS: Therapeutic exercises, Therapeutic activity, Neuromuscular re-education, Balance training, Gait training, Patient/Family education, Joint mobilization, and Stair training ?  ?  ? ? ?Cruzita Lederer Jona Erkkila, PT, DPT ?10/24/2021, 10:17 AM ? ?   ?

## 2021-10-24 NOTE — Patient Instructions (Signed)
Use MyChart to keep up with your appointments. You should receive notifications to remind you but it's good to get in a habit of checking MyChart regularly especially if you're feeing confused. Remember, all your appointments may not show up in MyChart if they are outside the [facility] system   ? ?Bring your meds and pill box to next ST therapy session. We can practice filling out your pill box together. You were 100% accurate for recognizing the errors in the pill box task today!   ? ?Take a picture of a bill so we can discuss all the details next time and make sure you would be able to do this on your own  ? ? ?

## 2021-10-24 NOTE — Therapy (Signed)
? ?OUTPATIENT OCCUPATIONAL THERAPY NEURO Treatment ? ?Patient Name: April Reilly ?MRN: [id number] ?DOB:May 03, 1971, 51 y.o., female ?Today's Date: 10/26/2021 ? ?PCP: Pcp, No ?REFERRING PROVIDER: Izora Ribas, MD ? ? ? ? ? ?Past Medical History:  ?Diagnosis Date  ? Asthma   ? Vaginal bleeding, abnormal   ? for 4 months  ? ?Past Surgical History:  ?Procedure Laterality Date  ? FOOT SURGERY    ? TUBAL LIGATION    ? ?Patient Active Problem List  ? Diagnosis Date Noted  ? 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 Hyponatremia 09/26/2021  ? Abnormal LFTs 09/26/2021  ? Dysfunctional uterine bleeding 09/26/2021  ? Uterine fibroid 09/19/2021  ? 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 Essential hypertension 09/16/2021  ? Hyperlipidemia 09/16/2021  ? Seizure ([place]) 09/16/2021  ? Aspiration pneumonia ([place]) 09/16/2021  ? Sickle cell disease ([place]) 09/16/2021  ? Iron deficiency anemia 09/16/2021  ? Cerebral edema ([place]) 09/10/2021  ? Acute metabolic encephalopathy 96/78/9381  ? ICH (intracerebral hemorrhage) ([place]) 09/10/2021  ? ? ?ONSET DATE: 09/10/21 ? ?REFERRING DIAG: ICH-R frontal, parietal, occipital and temporal hemorrhage ? ?THERAPY DIAG:  ?Muscle weakness (generalized) ? ?Unsteadiness on feet ? ?Other lack of coordination ? ?Frontal lobe and executive function deficit ? ?Attention and concentration deficit ? ?SUBJECTIVE:  ? ?SUBJECTIVE STATEMENT: ?Pt reports left shoulder pain ?Pt accompanied by: self ? ?PERTINENT HISTORY: 51 y.o. female admitted to [facility] on 09/10/2021 after fall out of bed followed by AMS. CT: significant edema in R frontal, parietal, occipital and temporal lobe with superimposed hemorrhage, . PMH includes sickle cell trait, anemia. Pt. d/c home on 09/26/21 following CIR.  ? ?PRECAUTIONS: Other: sickle cell , seizures, no driving, no work ? ?WEIGHT BEARING RESTRICTIONS No ? ?PAIN:  ?Are you having pain? Yes: NPRS scale: 3/10 ?Pain location: L shoulder  ?Pain description: aching ?Aggravating  factors: movement ?Relieving factors: Not moving it  ? ?FALLS: Has patient fallen in last 6 months? No,  ? ?LIVING ENVIRONMENT: ?Lives with: alone ?Lives in: House/apartment ?Stairs: Yes: External: 3 steps; none ?Has following equipment at home: Shower bench ? ?PLOF: Independent, head chef at [employer] ? ? ?PATIENT GOALS decrease shoulder pain ? ? ?Treatment: Environmental scanning :14/15 items located on first pass, pt located remaining item on second pass ?Completing 12 piece puzzle, increase time required and pt became externally distracted at times, but she was able to resume task without cueing. ?Pt with min reports of blurry vision. ?Copying small peg design using R hand for increased fine motor coordination and  visual perceptual skills, min v.c to avoid compensation and difficulty ? ? ?PATIENT EDUCATION: ?N/A ?  ?SHORT TERM GOALS: Target date: 11/23/2021 ? ?I with initial HEP ? ?Goal status: INITIAL ? ?2.  Pt will demonstrate ability to retrieve a lightweight object at 90* with no more than min compensations and pain no greater than 3/10.Marland Kitchen ?Baseline: 85* ?Goal status: INITIAL ? ?3.  Pt will perform basic home management with supervision demonstrating good safety awareness. ?Baseline:dependent ?Goal status: INITIAL ? ?4.  Pt will perform basic cooking with no more than min v.c/ min A demonstrating good safety awareness. 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 ?5.  Pt will perform a  a simple functional organization task such as generating a menu and grocery  list with     no more than min v.c ? ?Goal status: INITIAL ? ?6.  Pt will increase LUE grip strength by 5 lbs for increased LUE functional use. ?Baseline:  44.7 lbs ?Goal status: INITIAL ? ? 7. Pt will perform all basic ADLS modified independently ? ?LONG TERM GOALS: Target date:01/07/22 ? ?I with updated HEP ? ?Goal status: INITIAL ? ?2.  Pt will perform mod complex home management mod I  ? ?Goal status: INITIAL ? ?3.  Pt will perform mod  complex cooking modified independently demonstrating good safety awareness. ? ?Goal status: INITIAL ? ?4.  Pt will perform simulated work activities mod I ? ?Goal status: INITIAL ? ?5.  Pt will retrieve a light weight object at 110* shoulder flexion with pain no greater than 3/10. ? ?Goal status: INITIAL ?6. Pt will demonstrate ability to perform a physical and cognitive task simultaneously with 90%of better accuracy. ?Goal status: initial  ? ?8. Pt will perform tabletop and environmental scanning tasks with 90% or better accuracy. ?Goal status: Initial ? ?ASSESSMENT: ? ?CLINICAL IMPRESSION: ?Patient is a 51 y.o. female who was seen today for occupational therapy evaluation for ICH. Pt was admitted to [facility] on 09/10/2021 after fall out of bed followed by AMS. CT: significant edema in R frontal, parietal, occipital and temporal lobe with superimposed hemorrhage, . PMH includes sickle cell trait, anemia., HTN, seizures, aspiration pneumonia, hyponatremia, uterine bleeding  Pt. d/c home on 09/26/21 following CIR.  ? ?PRECAUTIONS: Other: sickle cell ? ?PERFORMANCE DEFICITS in functional skills including ADLs, IADLs, coordination, dexterity, ROM, strength, pain, flexibility, FMC, GMC, mobility, balance, decreased knowledge of precautions, decreased knowledge of use of DME, and UE functional use, cognitive skills including attention, energy/drive, learn, memory, problem solving, safety awareness, sequencing, thought, and understand, and psychosocial skills including coping strategies and routines and behaviors.  ? ?IMPAIRMENTS are limiting patient from ADLs, IADLs, rest and sleep, work, play, leisure, and social participation.  ? ?COMORBIDITIES may have co-morbidities  that affects occupational performance. Patient will benefit from skilled OT to address above impairments and improve overall function. ? ?MODIFICATION OR ASSISTANCE TO COMPLETE EVALUATION: No modification of tasks or assist necessary to complete an  evaluation. ? ?OT OCCUPATIONAL PROFILE AND HISTORY: Detailed assessment: Review of records and additional review of physical, cognitive, psychosocial history related to current functional performance. ? ?CLINICAL DECISION MAKING: LOW - limited treatment options, no task modification necessary ? ?REHAB POTENTIAL: Good ? ?EVALUATION COMPLEXITY: Moderate ? ? ? ?PLAN: ?OT FREQUENCY: 2x/week ? ?OT DURATION: 12 weeks plus eval- (POC written for 12 weeks to account for scheduling, anticipate d/c after 8 weeks dependent on progress.) ? ?PLANNED INTERVENTIONS: self care/ADL training, therapeutic exercise, therapeutic activity, neuromuscular re-education, manual therapy, passive range of motion, balance training, functional mobility training, aquatic therapy, electrical stimulation, ultrasound, paraffin, fluidotherapy, moist heat, cryotherapy, patient/family education, cognitive remediation/compensation, visual/perceptual remediation/compensation, energy conservation, coping strategies training, and DME and/or AE instructions ? ?RECOMMENDED OTHER SERVICES: PT, ST ? ?CONSULTED AND AGREED WITH PLAN OF CARE: Patient ? ?PLAN FOR NEXT SESSION: review HEP, continue tabletop scanning/ consider pipe tree design ? ? ?Vineet Kinney, OT ?10/26/2021, 7:40 AM ?Theone Murdoch, OTR/L ?[phone] ?Phone: [phone] ?7:40 AM 10/26/21  ? ? ? ? ? ? ?   ?

## 2021-10-24 NOTE — Patient Instructions (Signed)
1. Grip Strengthening (Resistive Putty)   Squeeze putty using thumb and all fingers. Repeat _20___ times. Do __2__ sessions per day.   2. Roll putty into tube on table and pinch between each finger and thumb x 10 reps each. (can do ring and small finger together)     Copyright  [employer]. All rights reserved.   

## 2021-10-24 NOTE — Therapy (Signed)
?OUTPATIENT SPEECH LANGUAGE PATHOLOGY TREATMENT NOTE ? ? ?Patient Name: April Reilly ?MRN: [id number] ?DOB:[birth year], 51 y.o., female ?Today's Date: 10/24/2021 ? ?PCP: Pcp, No ?REFERRING PROVIDER: Izora Ribas, MD ? ?END OF SESSION:  ? End of Session - 10/24/21 1205   ? ? Visit Number 3   ? Number of Visits 17   ? Date for SLP Re-Evaluation 12/14/21   ? Authorization Type [facility]   ? Authorization Time Period needs auth after 25th ST visit   ? SLP Start Time 1105   ? SLP Stop Time  1145   ? SLP Time Calculation (min) 40 min   ? Activity Tolerance Patient tolerated treatment well   ? ?  ?  ? ?  ? ? ? ?Past Medical History:  ?Diagnosis Date  ? Asthma   ? Vaginal bleeding, abnormal   ? for 4 months  ? ?Past Surgical History:  ?Procedure Laterality Date  ? FOOT SURGERY    ? TUBAL LIGATION    ? ?Patient Active Problem List  ? Diagnosis Date Noted  ? 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 Hyponatremia 09/26/2021  ? Abnormal LFTs 09/26/2021  ? Dysfunctional uterine bleeding 09/26/2021  ? Uterine fibroid 09/19/2021  ? 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 Essential hypertension 09/16/2021  ? Hyperlipidemia 09/16/2021  ? Seizure ([place]) 09/16/2021  ? Aspiration pneumonia ([place]) 09/16/2021  ? Sickle cell disease ([place]) 09/16/2021  ? Iron deficiency anemia 09/16/2021  ? Cerebral edema ([place]) 09/10/2021  ? Acute metabolic encephalopathy 36/64/4034  ? ICH (intracerebral hemorrhage) ([place]) 09/10/2021  ? ? ?ONSET DATE: 09-10-21 ? ?REFERRING DIAG: R-CVA  ? ?THERAPY DIAG: Cognitive communication deficit ? ?SUBJECTIVE: "I almost cancelled. I had a headache" ? ?PAIN:  ?Are you having pain? Yes  ?Pain Level: 5/10 ?Location: Headache  ? ?OBJECTIVE:  ? ?TODAY'S TREATMENT:  ?10-24-21: Pt reported headache/migraine today. Pain reduced with medication and pt agreeable to therapy. Pt is consistently writing down information/taking pictures to aid recall. Pt aware of errors x2 on second attempt to login into MyChart given  rare min A. SLP educated orientation and usage of MyChart for appointment management. Good recall of upcoming visits exhibited with initial repetition and no need for visual aid. SLP targeted pill box task to ID errors, in which pt completed with 100% accuracy given no cues. Pt will bring in pill box/medications and bill next session to target functional problem solving and attention. No difficulty reading exhibited today (given larger font) with good comprehension noted.  ? ?10-23-21: Pt reported recent confusion with managing appointments (showing up wrong date, difficulty managing various reminders) and difficulty recognizing previously familiar words while reading (vision deficits also indicated). CLQT completed today with the following deficits: severe executive functioning; moderate memory, attention, and visuospatial; & WNL language. Pt exhibited slowed processing (often finished tasks outside targeted timeframe) and delayed/reduced error awareness. Cognitive Function PROM completed with score of 109. Pt reported "often" trouble concentrating, difficulty managing time, and reading something several times to understand it and reported "sometimes" trouble keeping track if interrupted, working hard to pay attention, difficulty remembering without writing down, and trouble keeping appointments not part of regular routine. Some prior difficulty with attention and memory reported as pt felt easily distracted and occasionally forgetful. Plan to address appointment management and memory/attention compensations next session.  ? ?10-15-21: Educated patient on clinical observations and analysis of completed CLQT subtests. Decreased attention (sustained, focused, alternating), reduced short term recall (story retell, complex instructions) and impaired executive functioning (  error awareness, problem solving, slow processing) resulted in missed overt errors, difficulty recalling and completing more complex instructions, and  maintaining attention. Pt verbalized understanding of SLP education and agreeable for initiation of ST services to address cognitive linguistic skills as pt would like to return to baseline (independent management of ADLs/iADLs and return to work full time).  ?  ?  ?PATIENT EDUCATION: ?Education details: see above ?Person educated: Patient ?Education method: Explanation and Demonstration ?Education comprehension: verbalized understanding, returned demonstration, and needs further education ?  ?  ?  ?  ?GOALS: ?Goals reviewed with patient? Yes ?  ?SHORT TERM GOALS: Target date: 11/16/2021 ?  ?Pt will complete CLQT and PROM in first 1-2 ST sessions  ?Baseline:  ?Goal status: MET ?  ?2.  Pt will recall and implement 2 attention/memory compensations to aid daily functioning and completion of iADLs with occasional min A over 2 sessions ?Baseline: 10-24-21 ?Goal status: ONGOING ?  ?3.  Pt will use phone and/or calendar for orientation/time management given rare min A over 2 sessions  ?Baseline: 10-24-21 ?Goal status: ONGOING ?  ?4. Pt will complete functional iADL tasks (medication/financial management tasks) with 80% accuracy given occasional min A over 2 sessions  ?Baseline:  ?Goal status: ONGOING ?  ?5.  Pt will maintain sustained and focused attention for 15+ minutes for cognitive linguistic tasks given occasional min A over 2 sessions  ?Baseline: 10-24-21 ?Goal status: ONGOING ?  ?  ?LONG TERM GOALS: Target date: 12/14/2021 ?  ?Pt will recall and implement 4 attention/memory compensations to aid daily functioning and completion of iADLs with rare min A over 2 sessions ?Baseline:  ?Goal status: ONGOING ?  ?2.  Pt will complete functional iADL tasks (medication/financial management tasks) at home given rare min A over 2 sessions  ?Baseline:  ?Goal status: ONGOING ?  ?3.  Pt will maintain sustained and focused attention for 30+ minutes for cognitive linguistic tasks given rare min A over 2 sessions  ?Baseline:  ?Goal status:  ONGOING ?  ?4.  Pt will report improved cognitive linguistic functioning via PROM by 2 points at last ST session  ?Baseline: CF=109 ?Goal status: ONGOING ?  ?ASSESSMENT: ?  ?CLINICAL IMPRESSION: ?Patient is a 51 y.o. 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 Pt only partly aware of functional impact of current cognitive linguistic deficits (difficulty recalling DOW - texting family for answer). Initiated education and training of compensatory techniques to aid recall/attention and functional tasks to optimize independence.  ?  ?OBJECTIVE IMPAIRMENTS include attention, memory, awareness, and executive functioning. These impairments are limiting patient from managing medications, managing appointments, household responsibilities, and ADLs/IADLs. ?Factors affecting potential to achieve goals and functional outcome are family/community support.. Patient will benefit from skilled SLP services to address above impairments and improve overall function. ?  ?REHAB POTENTIAL: Good ?  ?PLAN: ?SLP FREQUENCY: 2x/week ?  ?SLP DURATION: 8 weeks ?  ?PLANNED INTERVENTIONS: Cognitive reorganization, Internal/external aids, Functional tasks, SLP instruction and feedback, Compensatory strategies, and Patient/family education ? ? ?Marzetta Board, CCC-SLP ?10/24/2021, 12:05 PM ? ? ?

## 2021-10-30 ENCOUNTER — Ambulatory Visit: Payer: 59 | Admitting: Physical Therapy

## 2021-10-30 ENCOUNTER — Ambulatory Visit: Payer: 59

## 2021-10-30 DIAGNOSIS — R41841 Cognitive communication deficit: Secondary | ICD-10-CM | POA: Diagnosis not present

## 2021-10-30 NOTE — Therapy (Signed)
?OUTPATIENT SPEECH LANGUAGE PATHOLOGY TREATMENT NOTE ? ? ?Patient Name: April Reilly ?MRN: [id number] ?DOB:06-16-1971, 51 y.o., female ?Today's Date: 10/30/2021 ? ?PCP: Pcp, No ?REFERRING PROVIDER: Izora Ribas, MD ? ?END OF SESSION:  ? End of Session - 10/30/21 1449   ? ? Visit Number 4   ? Number of Visits 17   ? Date for SLP Re-Evaluation 12/14/21   ? Authorization Type [facility]   ? Authorization Time Period needs auth after 25th ST visit   ? SLP Start Time 1450   pt arrived late  ? SLP Stop Time  1532   ? SLP Time Calculation (min) 42 min   ? Activity Tolerance Patient tolerated treatment well   ? ?  ?  ? ?  ? ? ? ?Past Medical History:  ?Diagnosis Date  ? Asthma   ? Vaginal bleeding, abnormal   ? for 4 months  ? ?Past Surgical History:  ?Procedure Laterality Date  ? FOOT SURGERY    ? TUBAL LIGATION    ? ?Patient Active Problem List  ? Diagnosis Date Noted  ? 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 Hyponatremia 09/26/2021  ? Abnormal LFTs 09/26/2021  ? Dysfunctional uterine bleeding 09/26/2021  ? Uterine fibroid 09/19/2021  ? 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 Essential hypertension 09/16/2021  ? Hyperlipidemia 09/16/2021  ? Seizure ([place]) 09/16/2021  ? Aspiration pneumonia ([place]) 09/16/2021  ? Sickle cell disease ([place]) 09/16/2021  ? Iron deficiency anemia 09/16/2021  ? Cerebral edema ([place]) 09/10/2021  ? Acute metabolic encephalopathy 02/77/4128  ? ICH (intracerebral hemorrhage) ([place]) 09/10/2021  ? ? ?ONSET DATE: 09-10-21 ? ?REFERRING DIAG: R-CVA  ? ?THERAPY DIAG: Cognitive communication deficit ? ?SUBJECTIVE: "I almost cancelled. I'm in a lot of pain" ? ?PAIN:  ?Are you having pain? Yes  ?Pain Level: 9/10 ?Location: Wrist/Elbow/Shoulder  ? ?OBJECTIVE:  ? ?TODAY'S TREATMENT:  ?10-30-21: Increased pain reported today but agreeable to ST session. Pt did not bring pill box but apparently filled out at home with success (daughter reportedly doubled check with no errors found). Pt able to  recall strategies x2 to ensure accuracy (reading medication bottle and monitoring physical appearance of pills). No missed appointments reported with success using MyChart to aid appointment management. SLP targeted functional calculation tasks related to cognitive skills required at work (time management, accuracy with proportions, etc). Pt able to complete functional calculation task with 100% accuracy given mild extra processing time.  ? ?10-24-21: Pt reported headache/migraine today. Pain reduced with medication and pt agreeable to therapy. Pt is consistently writing down information/taking pictures to aid recall. Pt aware of errors x2 on second attempt to login into MyChart given rare min A. SLP educated orientation and usage of MyChart for appointment management. Good recall of upcoming visits exhibited with initial repetition and no need for visual aid. SLP targeted pill box task to ID errors, in which pt completed with 100% accuracy given no cues. Pt will bring in pill box/medications and bill next session to target functional problem solving and attention. No difficulty reading exhibited today (given larger font) with good comprehension noted.  ? ?10-23-21: Pt reported recent confusion with managing appointments (showing up wrong date, difficulty managing various reminders) and difficulty recognizing previously familiar words while reading (vision deficits also indicated). CLQT completed today with the following deficits: severe executive functioning; moderate memory, attention, and visuospatial; & WNL language. Pt exhibited slowed processing (often finished tasks outside targeted timeframe) and delayed/reduced error awareness. Cognitive Function PROM completed with  score of 109. Pt reported "often" trouble concentrating, difficulty managing time, and reading something several times to understand it and reported "sometimes" trouble keeping track if interrupted, working hard to pay attention, difficulty  remembering without writing down, and trouble keeping appointments not part of regular routine. Some prior difficulty with attention and memory reported as pt felt easily distracted and occasionally forgetful. Plan to address appointment management and memory/attention compensations next session.  ? ?10-15-21: Educated patient on clinical observations and analysis of completed CLQT subtests. Decreased attention (sustained, focused, alternating), reduced short term recall (story retell, complex instructions) and impaired executive functioning (error awareness, problem solving, slow processing) resulted in missed overt errors, difficulty recalling and completing more complex instructions, and maintaining attention. Pt verbalized understanding of SLP education and agreeable for initiation of ST services to address cognitive linguistic skills as pt would like to return to baseline (independent management of ADLs/iADLs and return to work full time).  ?  ?  ?PATIENT EDUCATION: ?Education details: see above ?Person educated: Patient ?Education method: Explanation and Demonstration ?Education comprehension: verbalized understanding, returned demonstration, and needs further education ?  ?  ?  ?  ?GOALS: ?Goals reviewed with patient? Yes ?  ?SHORT TERM GOALS: Target date: 11/16/2021 ?  ?Pt will complete CLQT and PROM in first 1-2 ST sessions  ?Baseline:  ?Goal status: MET ?  ?2.  Pt will recall and implement 2 attention/memory compensations to aid daily functioning and completion of iADLs with occasional min A over 2 sessions ?Baseline: 10-24-21 ?Goal status: ONGOING ?  ?3.  Pt will use phone and/or calendar for orientation/time management given rare min A over 2 sessions  ?Baseline: 10-24-21, 10-30-21 ?Goal status: MET ?  ?4. Pt will complete functional iADL tasks (medication/financial management tasks) with 80% accuracy given occasional min A over 2 sessions  ?Baseline: 10-24-21 ?Goal status: ONGOING ?  ?5.  Pt will maintain  sustained and focused attention for 15+ minutes for cognitive linguistic tasks given occasional min A over 2 sessions  ?Baseline: 10-24-21, 10-30-21 ?Goal status: MET ?  ?  ?LONG TERM GOALS: Target date: 12/14/2021 ?  ?Pt will recall and implement 4 attention/memory compensations to aid daily functioning and completion of iADLs with rare min A over 2 sessions ?Baseline:  ?Goal status: ONGOING ?  ?2.  Pt will complete functional iADL tasks (medication/financial management tasks) at home given rare min A over 2 sessions  ?Baseline:  ?Goal status: ONGOING ?  ?3.  Pt will maintain sustained and focused attention for 30+ minutes for cognitive linguistic tasks given rare min A over 2 sessions  ?Baseline:  ?Goal status: ONGOING ?  ?4.  Pt will report improved cognitive linguistic functioning via PROM by 2 points at last ST session  ?Baseline: CF=109 ?Goal status: ONGOING ?  ?ASSESSMENT: ?  ?CLINICAL IMPRESSION: ?Patient is a 51 y.o. 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 Conducted ongoing education and training of compensatory techniques to aid recall/attention and functional tasks to optimize independence.  ?  ?OBJECTIVE IMPAIRMENTS include attention, memory, awareness, and executive functioning. These impairments are limiting patient from managing medications, managing appointments, household responsibilities, and ADLs/IADLs. ?Factors affecting potential to achieve goals and functional outcome are family/community support.. Patient will benefit from skilled SLP services to address above impairments and improve  overall function. ?  ?REHAB POTENTIAL: Good ?  ?PLAN: ?SLP FREQUENCY: 2x/week ?  ?SLP DURATION: 8 weeks ?  ?PLANNED INTERVENTIONS: Cognitive reorganization,  Internal/external aids, Functional tasks, SLP instruction and feedback, Compensatory strategies, and Patient/family education ? ? ?Marzetta Board, CCC-SLP ?10/30/2021, 3:34 PM ? ? ?

## 2021-10-31 ENCOUNTER — Ambulatory Visit: Payer: 59

## 2021-10-31 ENCOUNTER — Ambulatory Visit: Payer: 59 | Admitting: Occupational Therapy

## 2021-10-31 VITALS — BP 101/69

## 2021-10-31 DIAGNOSIS — R2681 Unsteadiness on feet: Secondary | ICD-10-CM

## 2021-10-31 DIAGNOSIS — R41840 Attention and concentration deficit: Secondary | ICD-10-CM

## 2021-10-31 DIAGNOSIS — R41841 Cognitive communication deficit: Secondary | ICD-10-CM | POA: Diagnosis not present

## 2021-10-31 DIAGNOSIS — R278 Other lack of coordination: Secondary | ICD-10-CM

## 2021-10-31 DIAGNOSIS — M6281 Muscle weakness (generalized): Secondary | ICD-10-CM

## 2021-10-31 DIAGNOSIS — R41844 Frontal lobe and executive function deficit: Secondary | ICD-10-CM

## 2021-10-31 NOTE — Therapy (Signed)
?OUTPATIENT SPEECH LANGUAGE PATHOLOGY TREATMENT NOTE ? ? ?Patient Name: April Reilly ?MRN: [id number] ?DOB:03/17/1971, 51 y.o., female ?Today's Date: 10/31/2021 ? ?PCP: Pcp, No ?REFERRING PROVIDER: Izora Ribas, MD ? ?END OF SESSION:  ? End of Session - 10/31/21 1059   ? ? Visit Number 5   ? Number of Visits 17   ? Date for SLP Re-Evaluation 12/14/21   ? Authorization Type [facility]   ? Authorization Time Period needs auth after 25th ST visit   ? SLP Start Time 1103   ? SLP Stop Time  1145   ? SLP Time Calculation (min) 42 min   ? Activity Tolerance Patient tolerated treatment well   ? ?  ?  ? ?  ? ? ? ? ?Past Medical History:  ?Diagnosis Date  ? Asthma   ? Vaginal bleeding, abnormal   ? for 4 months  ? ?Past Surgical History:  ?Procedure Laterality Date  ? FOOT SURGERY    ? TUBAL LIGATION    ? ?Patient Active Problem List  ? Diagnosis Date Noted  ? 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 Hyponatremia 09/26/2021  ? Abnormal LFTs 09/26/2021  ? Dysfunctional uterine bleeding 09/26/2021  ? Uterine fibroid 09/19/2021  ? 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 Essential hypertension 09/16/2021  ? Hyperlipidemia 09/16/2021  ? Seizure ([place]) 09/16/2021  ? Aspiration pneumonia ([place]) 09/16/2021  ? Sickle cell disease ([place]) 09/16/2021  ? Iron deficiency anemia 09/16/2021  ? Cerebral edema ([place]) 09/10/2021  ? Acute metabolic encephalopathy 99/35/7017  ? ICH (intracerebral hemorrhage) ([place]) 09/10/2021  ? ? ?ONSET DATE: 09-10-21 ? ?REFERRING DIAG: R-CVA  ? ?THERAPY DIAG: Cognitive communication deficit ? ?SUBJECTIVE: "I didn't get to do any homework" ? ?PAIN:  ?Are you having pain? Yes  ?Pain Level: 2/10 ?Location: Wrist/Elbow/Shoulder  ? ?OBJECTIVE:  ? ?TODAY'S TREATMENT:  ?10-31-21: No HEP completed last night due to pain following ST session yesterday. Pt returned with HEP per SLP recommendation. Targeted additional functional cognitive linguistic tasks (calculations and sequencing), in which pt able to  complete with 88% with rare cues. Min to mod cues x2 required to aid comprehension and accuracy. Pt exhibited improving attention and awareness for detail oriented tasks. Good alternating attention exhibited between task and side conversation. HEP provided with recommendation to double check and reading directions twice.   ? ?10-30-21: Increased pain reported today but agreeable to ST session. Pt did not bring pill box but apparently filled out at home with success (daughter reportedly doubled check with no errors found). Pt able to recall strategies x2 to ensure accuracy (reading medication bottle and monitoring physical appearance of pills). No missed appointments reported with success using MyChart to aid appointment management. SLP targeted functional calculation tasks related to cognitive skills required at work (time management, accuracy with proportions, etc). Pt able to complete functional calculation task with 100% accuracy given mild extra processing time.  ? ?10-24-21: Pt reported headache/migraine today. Pain reduced with medication and pt agreeable to therapy. Pt is consistently writing down information/taking pictures to aid recall. Pt aware of errors x2 on second attempt to login into MyChart given rare min A. SLP educated orientation and usage of MyChart for appointment management. Good recall of upcoming visits exhibited with initial repetition and no need for visual aid. SLP targeted pill box task to ID errors, in which pt completed with 100% accuracy given no cues. Pt will bring in pill box/medications and bill next session to target functional problem solving and attention.  No difficulty reading exhibited today (given larger font) with good comprehension noted.  ? ?10-23-21: Pt reported recent confusion with managing appointments (showing up wrong date, difficulty managing various reminders) and difficulty recognizing previously familiar words while reading (vision deficits also indicated). CLQT  completed today with the following deficits: severe executive functioning; moderate memory, attention, and visuospatial; & WNL language. Pt exhibited slowed processing (often finished tasks outside targeted timeframe) and delayed/reduced error awareness. Cognitive Function PROM completed with score of 109. Pt reported "often" trouble concentrating, difficulty managing time, and reading something several times to understand it and reported "sometimes" trouble keeping track if interrupted, working hard to pay attention, difficulty remembering without writing down, and trouble keeping appointments not part of regular routine. Some prior difficulty with attention and memory reported as pt felt easily distracted and occasionally forgetful. Plan to address appointment management and memory/attention compensations next session.  ? ?10-15-21: Educated patient on clinical observations and analysis of completed CLQT subtests. Decreased attention (sustained, focused, alternating), reduced short term recall (story retell, complex instructions) and impaired executive functioning (error awareness, problem solving, slow processing) resulted in missed overt errors, difficulty recalling and completing more complex instructions, and maintaining attention. Pt verbalized understanding of SLP education and agreeable for initiation of ST services to address cognitive linguistic skills as pt would like to return to baseline (independent management of ADLs/iADLs and return to work full time).  ?  ?  ?PATIENT EDUCATION: ?Education details: see above ?Person educated: Patient ?Education method: Explanation and Demonstration ?Education comprehension: verbalized understanding, returned demonstration, and needs further education ?  ?  ?  ?  ?GOALS: ?Goals reviewed with patient? Yes ?  ?SHORT TERM GOALS: Target date: 11/16/2021 ?  ?Pt will complete CLQT and PROM in first 1-2 ST sessions  ?Baseline:  ?Goal status: MET ?  ?2.  Pt will recall and  implement 2 attention/memory compensations to aid daily functioning and completion of iADLs with occasional min A over 2 sessions ?Baseline: 10-24-21 ?Goal status: ONGOING ?  ?3.  Pt will use phone and/or calendar for orientation/time management given rare min A over 2 sessions  ?Baseline: 10-24-21, 10-30-21 ?Goal status: MET ?  ?4. Pt will complete functional iADL tasks (medication/financial management tasks) with 80% accuracy given occasional min A over 2 sessions  ?Baseline: 10-24-21,  ?Goal status: ONGOING ?  ?5.  Pt will maintain sustained and focused attention for 15+ minutes for cognitive linguistic tasks given occasional min A over 2 sessions  ?Baseline: 10-24-21, 10-30-21 ?Goal status: MET ?  ?  ?LONG TERM GOALS: Target date: 12/14/2021 ?  ?Pt will recall and implement 4 attention/memory compensations to aid daily functioning and completion of iADLs with rare min A over 2 sessions ?Baseline:  ?Goal status: ONGOING ?  ?2.  Pt will complete functional iADL tasks (medication/financial management tasks) at home given rare min A over 2 sessions  ?Baseline:  ?Goal status: ONGOING ?  ?3.  Pt will maintain sustained and focused attention for 30+ minutes for cognitive linguistic tasks given rare min A over 2 sessions  ?Baseline:  ?Goal status: ONGOING ?  ?4.  Pt will report improved cognitive linguistic functioning via PROM by 2 points at last ST session  ?Baseline: CF=109 ?Goal status: ONGOING ?  ?ASSESSMENT: ?  ?CLINICAL IMPRESSION: ?Patient is a 51 y.o. 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 SLP initially identified reduced awareness, attention, recall, and executive functioning skills, which would impact ability to successfully  and safely manage medications, finances, and work related tasks as a Biomedical scientist. Conducted ongoing education and training of compensatory techniques and cognitive linguistic tasks to aid recall/attention and completion of functional household tasks to  optimize independence.  ?  ?OBJECTIVE IMPAIRMENTS include attention, memory, awareness, and executive functioning. These impairments are limiting patient from managing medications, managing appointments, household resp

## 2021-10-31 NOTE — Therapy (Signed)
? ?OUTPATIENT OCCUPATIONAL THERAPY NEURO Treatment ? ?Patient Name: April Reilly ?MRN: [id number] ?DOB:1971-03-09, 51 y.o., female ?Today's Date: 10/31/2021 ? ?PCP: Pcp, No ?REFERRING PROVIDER: Izora Ribas, MD ? ? OT End of Session - 10/31/21 1037   ? ? Visit Number 4   ? Number of Visits 25   ? Date for OT Re-Evaluation 01/07/22   ? OT Start Time 1018   ? OT Stop Time 1100   ? OT Time Calculation (min) 42 min   ? Activity Tolerance Patient tolerated treatment well   ? ?  ?  ? ?  ? ? ? ? ?Past Medical History:  ?Diagnosis Date  ? Asthma   ? Vaginal bleeding, abnormal   ? for 4 months  ? ?Past Surgical History:  ?Procedure Laterality Date  ? FOOT SURGERY    ? TUBAL LIGATION    ? ?Patient Active Problem List  ? Diagnosis Date Noted  ? 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 Hyponatremia 09/26/2021  ? Abnormal LFTs 09/26/2021  ? Dysfunctional uterine bleeding 09/26/2021  ? Uterine fibroid 09/19/2021  ? 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 Essential hypertension 09/16/2021  ? Hyperlipidemia 09/16/2021  ? Seizure ([place]) 09/16/2021  ? Aspiration pneumonia ([place]) 09/16/2021  ? Sickle cell disease ([place]) 09/16/2021  ? Iron deficiency anemia 09/16/2021  ? Cerebral edema ([place]) 09/10/2021  ? Acute metabolic encephalopathy 27/09/5007  ? ICH (intracerebral hemorrhage) ([place]) 09/10/2021  ? ? ?ONSET DATE: 09/10/21 ? ?REFERRING DIAG: ICH-R frontal, parietal, occipital and temporal hemorrhage ? ?THERAPY DIAG:  ?Muscle weakness (generalized) ? ?Unsteadiness on feet ? ?Other lack of coordination ? ?Frontal lobe and executive function deficit ? ?Attention and concentration deficit ? ?SUBJECTIVE:  ? ?SUBJECTIVE STATEMENT: ?Pt reports left shoulder pain ?Pt accompanied by: self ? ?PERTINENT HISTORY: 51 y.o. female admitted to [facility] on 09/10/2021 after fall out of bed followed by [facility]. CT: significant edema in R frontal, parietal, occipital and temporal lobe with superimposed hemorrhage, . PMH includes sickle cell trait,  anemia. Pt. d/c home on 09/26/21 following CIR.  ? ?PRECAUTIONS: Other: sickle cell , seizures, no driving, no work ? ?WEIGHT BEARING RESTRICTIONS No ? ?PAIN:  ?Are you having pain? Yes: NPRS scale: 6/10 ?Pain location: left wrist ?Pain description: aching ?Aggravating factors: movement ?Relieving factors: Not moving it , heat ? ?FALLS: Has patient fallen in last 6 months? No,  ? ?LIVING ENVIRONMENT: ?Lives with: alone ?Lives in: House/apartment ?Stairs: Yes: External: 3 steps; none ?Has following equipment at home: Shower bench ? ?PLOF: Independent, head chef at [employer] ? ? ?PATIENT GOALS decrease shoulder pain ? ? ?Treatment: Fluidotherapy x 9 mins to L wrist and hand due to pain, no adverse reactions.  ?Pt reports squeezing putty at home for about 10 mins, pain may be related to overuse of LUE. Pt was instructed to hold off on putty for today and to use it again tomorrow only for 20 reps. ?Pipe tree design x 2, min v.c initally for 1st design, then no cues required for second design, pt remains distracted by others in gym during task but completed successfully. ?Large print word search for increased attention and scanning, pt demonstrates excellent performance. Pt  ?Demonstrates improved focus with this task, ?. ?Pt reports her wrist pain was improved at end of session. ? ? ? ? ?PATIENT EDUCATION: ?N/A ?  ?SHORT TERM GOALS: Target date: 11/28/2021 ? ?I with initial HEP ? ?Goal status: INITIAL ? ?2.  Pt will demonstrate ability to retrieve a lightweight  object at 29* with no more than min compensations and pain no greater than 3/10.Marland Kitchen ?Baseline: 85* ?Goal status: INITIAL ? ?3.  Pt will perform basic home management with supervision demonstrating good safety awareness. ?Baseline:dependent ?Goal status: INITIAL ? ?4.  Pt will perform basic cooking with no more than min v.c/ min A demonstrating good safety awareness. 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 ?5.  Pt will perform a  a simple functional  organization task such as generating a menu and grocery  list with     no more than min v.c ? ?Goal status: INITIAL ? ?6.  Pt will increase LUE grip strength by 5 lbs for increased LUE functional use. ?Baseline: 44.7 lbs ?Goal status: INITIAL ? ? 7. Pt will perform all basic ADLS modified independently ? ?LONG TERM GOALS: Target date:01/07/22 ? ?I with updated HEP ? ?Goal status: INITIAL ? ?2.  Pt will perform mod complex home management mod I  ? ?Goal status: INITIAL ? ?3.  Pt will perform mod complex cooking modified independently demonstrating good safety awareness. ? ?Goal status: INITIAL ? ?4.  Pt will perform simulated work activities mod I ? ?Goal status: INITIAL ? ?5.  Pt will retrieve a light weight object at 110* shoulder flexion with pain no greater than 3/10. ? ?Goal status: INITIAL ?6. Pt will demonstrate ability to perform a physical and cognitive task simultaneously with 90%of better accuracy. ?Goal status: initial  ? ?8. Pt will perform tabletop and environmental scanning tasks with 90% or better accuracy. ?Goal status: Initial ? ?ASSESSMENT: ? ?CLINICAL IMPRESSION: ?Pt is progressing towards goals. She remains limited today by pain. ?PRECAUTIONS: Other: sickle cell ? ?PERFORMANCE DEFICITS in functional skills including ADLs, IADLs, coordination, dexterity, ROM, strength, pain, flexibility, FMC, GMC, mobility, balance, decreased knowledge of precautions, decreased knowledge of use of DME, and UE functional use, cognitive skills including attention, energy/drive, learn, memory, problem solving, safety awareness, sequencing, thought, and understand, and psychosocial skills including coping strategies and routines and behaviors.  ? ?IMPAIRMENTS are limiting patient from ADLs, IADLs, rest and sleep, work, play, leisure, and social participation.  ? ?COMORBIDITIES may have co-morbidities  that affects occupational performance. Patient will benefit from skilled OT to address above impairments and improve  overall function. ? ?MODIFICATION OR ASSISTANCE TO COMPLETE EVALUATION: No modification of tasks or assist necessary to complete an evaluation. ? ?OT OCCUPATIONAL PROFILE AND HISTORY: Detailed assessment: Review of records and additional review of physical, cognitive, psychosocial history related to current functional performance. ? ?CLINICAL DECISION MAKING: LOW - limited treatment options, no task modification necessary ? ?REHAB POTENTIAL: Good ? ?EVALUATION COMPLEXITY: Moderate ? ? ? ?PLAN: ?OT FREQUENCY: 2x/week ? ?OT DURATION: 12 weeks plus eval- (POC written for 12 weeks to account for scheduling, anticipate d/c after 8 weeks dependent on progress.) ? ?PLANNED INTERVENTIONS: self care/ADL training, therapeutic exercise, therapeutic activity, neuromuscular re-education, manual therapy, passive range of motion, balance training, functional mobility training, aquatic therapy, electrical stimulation, ultrasound, paraffin, fluidotherapy, moist heat, cryotherapy, patient/family education, cognitive remediation/compensation, visual/perceptual remediation/compensation, energy conservation, coping strategies training, and DME and/or AE instructions ? ?RECOMMENDED OTHER SERVICES: PT, ST ? ?CONSULTED AND AGREED WITH PLAN OF CARE: Patient ? ?PLAN FOR NEXT SESSION: monitor pain, review HEP, continue tabletop scanning/ consider pipe tree design ? ? ?Tarica Harl, OT ?10/31/2021, 10:37 AM ?Theone Murdoch, OTR/L ?[phone] ?Phone: [phone] ?10:37 AM 10/31/21  ? ? ? ? ? ? ?   ?

## 2021-11-06 ENCOUNTER — Ambulatory Visit: Payer: 59 | Admitting: Occupational Therapy

## 2021-11-06 ENCOUNTER — Ambulatory Visit: Payer: 59 | Admitting: Physical Therapy

## 2021-11-06 ENCOUNTER — Ambulatory Visit: Payer: 59

## 2021-11-07 ENCOUNTER — Ambulatory Visit: Payer: 59 | Admitting: Occupational Therapy

## 2021-11-07 ENCOUNTER — Ambulatory Visit: Payer: 59

## 2021-11-08 NOTE — Progress Notes (Signed)
? ? ?Patient ID: April Reilly, female    DOB: [birth year]  MRN: [id number] ? ?CC: Emergency Department Follow-Up ? ?Subjective: ?April Reilly is a 51 y.o. female who presents for emergency department follow-up. She is accompanied by her daughter.  ? ?Her concerns today include:  ?EMERGENCY DEPARTMENT FOLLOW-UP: ?10/19/2021 [facility] Emergency Department per MD note: ?51 year old with extensive history with recent admission 1 month ago for cerebral venous sinus thrombosis, AMS here for evaluation of right calf pain which began earlier today.  She is afebrile, nonseptic, not ill-appearing.  States she has been compliant with her anticoagulation.  She denies any headache, vision changes, weakness, chest pain, shortness of breath, back pain or abdominal pain.  She took her home tramadol which resolved her right leg pain.  Her compartments are soft.  She is neurovascularly intact.  She is ambulatory.  No overlying skin changes to suggest infectious process, gas forming organism. I reviewed her prior admission notes as well as consultation notes.  I also reviewed her ultrasound on 2/22 which was negative for bilateral DVT. ?  ?Work-up started from triage today personally viewed and interpreted: ?  ?CBC without leukocytosis ?Metabolic panel without significant abnormality ?INR 1.3 ?Ultrasound shows findings consistent with age-indeterminate DVT involving right posterior tibial veins and right peroneal veins. ?  ?Unclear if DVT is old or new?  She did have significant immobilization during her hospitalization.  When she was initially admitted she did have ultrasound which was negative for DVT bilaterally.  We will touch base with heme-onc to see if they feel patient would benefit from changing her anticoagulation versus staying on her home meds. ?  ?CONSULT with Dr. Lorenso Courier with Hemo/onc who rec to treat as failure with DOAC.  He recommends therapeutic Lovenox, 1 mg/kg twice daily.  States patient does not need to  be admitted for bridging as she can start the Lovenox when her next dose of when her Eliquis was due. She is due for her dose "at dinner time" per patient. ?  ?Patient does have appointment with PCP on Monday. States he can follow-up with patient as well if PCP uncomfortable with prescribing Lovenox. ?  ?Patient reassessed.  Discussed plan with patient, daughter.  They are comfortable with injections at home.  I discussed with pharmacy.  Given timing of her DOAC at home will be able to give her first dose of Lovenox here as well as pharmacy to bedside to teach about injections.  Patient denies any headache, abdominal pain, chest pain, shortness of breath.  I have low suspicion for PE, worsening of her prior cerebral thrombosis. ?  ?Patient has requested I refill her home medications she does not have enough to get her through the weekend until her appointment on Monday.  Will refill. ?  ?The patient has been appropriately medically screened and/or stabilized in the ED. I have low suspicion for any other emergent medical condition which would require further screening, evaluation or treatment in the ED or require inpatient management. ?  ?Patient is hemodynamically stable and in no acute distress.  Patient able to ambulate in department prior to ED.  Evaluation does not show acute pathology that would require ongoing or additional emergent interventions while in the emergency department or further inpatient treatment.  I have discussed the diagnosis with the patient and answered all questions.  Pain is been managed while in the emergency department and patient has no further complaints prior to discharge.  Patient is comfortable with plan  discussed in room and is stable for discharge at this time.  I have discussed strict return precautions for returning to the emergency department.  Patient was encouraged to follow-up with PCP/specialist refer to at discharge.  ?  ?Discussed with attending, Dr. Dorothea Ogle who is  agreeable for above treatment, plan and disposition ?  ?                        ?Medical Decision Making ?Amount and/or Complexity of Data Reviewed ?Independent Historian:  ?   Details: family ?External Data Reviewed: labs, radiology, ECG and notes. ?Labs: ordered. Decision-making details documented in ED Course. ?Radiology: ordered and independent interpretation performed. Decision-making details documented in ED Course. ?  ?Risk ?OTC drugs. ?Prescription drug management. ?Parenteral controlled substances. ?Drug therapy requiring intensive monitoring for toxicity. ?Diagnosis or treatment significantly limited by social determinants of health. ?  ?11/05/2021 [facility] [place] per MD note: ?PLANS:  ?1. Cerebral venous sinus thrombosis ? ?2. Recent cerebral hemorrhage ? ?3. Sickle cell disease with cerebrovascular involvement ([name]) ? ?4. 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 ?5. Acute deep vein thrombosis (DVT) of distal vein of right lower extremity ([name]) ? ?6. 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 ?7. Essential hypertension ? ?Medication list reviewed. For now she will continue with the prescribed regimen. She will continue Lovenox. Hematology consultation tomorrow scheduled. She wishes to follow-up with her neurologist back in [place]. Hospital reports reviewed. Emergency department reports reviewed. ? ?11/14/2021: ?Reports since emergency department discharge doing ok. Denies red flag symptoms such as but not limited to chest pain, shortness of breath, lower extremity edema. Taking anticoagulants as prescribed. Still having headaches daily and followed by Neurology with appointment coming up soon based in her hometown in [place]. Reports her neurologist in [place] will also be managing hematology as well. Plans to transition to Neurology in [facility] June 2023. She is aware that she should not drive and to have assistance with activities of daily living. She is aware  that Neurology will need to give medical clearance regarding resuming any activities. Has 4 very supportive daughters as well as friends to assist in the home and to appointments.  ? ?Has noticed a change in vision, right eye with blurry vision. Difficulty recalling words sometimes. Decreased appetite. Reports left shoulder pain throbbing into armpit and has normal range of motion. Reports told at the hospital that any pain she has is likely related to the stroke and/or sickle cell. Has repeat CT head on 11/19/2021. ? ?OT and speech therapy seeing her 3 times weekly. Has been released by PT.  ? ? ?Patient Active Problem List  ? Diagnosis Date Noted  ? 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 Hyponatremia 09/26/2021  ? Abnormal LFTs 09/26/2021  ? Dysfunctional uterine bleeding 09/26/2021  ? Uterine fibroid 09/19/2021  ? 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 Essential hypertension 09/16/2021  ? Hyperlipidemia 09/16/2021  ? Seizure ([place]) 09/16/2021  ? Aspiration pneumonia ([place]) 09/16/2021  ? Sickle cell disease ([place]) 09/16/2021  ? Iron deficiency anemia 09/16/2021  ? Cerebral edema ([place]) 09/10/2021  ? Acute metabolic encephalopathy 09/60/4540  ? ICH (intracerebral hemorrhage) ([place]) 09/10/2021  ? Reactive airway disease without complication 98/05/9146  ?  ? ?Current Outpatient Medications on File Prior to Visit  ?Medication Sig Dispense Refill  ? topiramate (TOPAMAX) 25 MG tablet     ? ACETAMINOPHEN-BUTALBITAL 50-325 MG TABS Take 1 tablet by mouth 2 (  two) times daily as needed.    ? atorvastatin (LIPITOR) 40 MG tablet Take 1 tablet (40 mg total) by mouth daily. 30 tablet 0  ? enoxaparin (LOVENOX) 60 MG/0.6ML injection Inject 0.6 mLs (60 mg total) into the skin every 12 (twelve) hours. 36 mL 0  ? levETIRAcetam (KEPPRA) 500 MG tablet Take 1 tablet (500 mg total) by mouth 2 (two) times daily. 60 tablet 0  ? metoprolol tartrate (LOPRESSOR) 25 MG tablet Take 1/2 tablet (12.5 mg total) by mouth 2 (two) times  daily. 30 tablet 0  ? PARoxetine (PAXIL) 10 MG tablet Take 1 tablet (10 mg total) by mouth daily. 30 tablet 0  ? traMADol (ULTRAM) 50 MG tablet Take 0.5 tablets (25 mg total) by mouth 2 (two) times daily as ne

## 2021-11-12 ENCOUNTER — Ambulatory Visit: Payer: Self-pay

## 2021-11-12 NOTE — Telephone Encounter (Signed)
?  Chief Complaint: nausea ?Symptoms: nausea  ?Frequency: 3 days ?Pertinent Negatives: Patient denies vomiting and diarrhea  ?Disposition: '[]'$ ED /'[]'$ Urgent Care (no appt availability in office) / '[]'$ Appointment(In office/virtual)/ '[]'$  [facility]/ '[x]'$ Home Care/ '[]'$ Refused Recommended Disposition /'[]'$ [facility]/ '[]'$  Follow-up with PCP ?Additional Notes: pt states that she has been nauseous for 3 days constantly but still able to eat and drink. Pt has OV scheduled for 11/14/21. Pt was advised care advice and to talk to provider about nausea.  ? ?Reason for Disposition ? Unexplained nausea ? ?Answer Assessment - Initial Assessment Questions ?1. NAUSEA SEVERITY: "How bad is the nausea?" (e.g., mild, moderate, severe; dehydration, weight loss) ?  - MILD: loss of appetite without change in eating habits ?  - MODERATE: decreased oral intake without significant weight loss, dehydration, or malnutrition ?  - SEVERE: inadequate caloric or fluid intake, significant weight loss, symptoms of dehydration ?    Mild ?2. ONSET: "When did the nausea begin?" ?    3 days  ?3. VOMITING: "Any vomiting?" If Yes, ask: "How many times today?" ?    No ? ?Protocols used: Nausea-A-AH ? ?

## 2021-11-13 ENCOUNTER — Ambulatory Visit: Payer: 59 | Admitting: Physical Therapy

## 2021-11-13 ENCOUNTER — Ambulatory Visit: Payer: 59 | Admitting: Occupational Therapy

## 2021-11-13 ENCOUNTER — Ambulatory Visit: Payer: 59

## 2021-11-13 ENCOUNTER — Telehealth: Payer: Self-pay | Admitting: Neurology

## 2021-11-13 DIAGNOSIS — R41841 Cognitive communication deficit: Secondary | ICD-10-CM

## 2021-11-13 NOTE — Telephone Encounter (Signed)
Patient requesting a call back from Dr. April Manson (only) when he comes back from vacation regarding her medications. Best call back [phone]. ?

## 2021-11-13 NOTE — Therapy (Signed)
?OUTPATIENT SPEECH LANGUAGE PATHOLOGY TREATMENT NOTE ? ? ?Patient Name: April Reilly ?MRN: [id number] ?DOB:07-05-1971, 51 y.o., female ?Today's Date: 11/13/2021 ? ?PCP: Pcp, No ?REFERRING PROVIDER: Izora Ribas, MD ? ?END OF SESSION:  ? End of Session - 11/13/21 1150   ? ? Visit Number 6   ? Number of Visits 17   ? Date for SLP Re-Evaluation 12/14/21   ? Authorization Type [facility]   ? Authorization Time Period needs auth after 25th ST visit   ? SLP Start Time 1149   ? SLP Stop Time  1230   ? SLP Time Calculation (min) 41 min   ? Activity Tolerance Patient tolerated treatment well   ? ?  ?  ? ?  ? ? ? ? ?Past Medical History:  ?Diagnosis Date  ? Asthma   ? Vaginal bleeding, abnormal   ? for 4 months  ? ?Past Surgical History:  ?Procedure Laterality Date  ? FOOT SURGERY    ? TUBAL LIGATION    ? ?Patient Active Problem List  ? Diagnosis Date Noted  ? 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 Hyponatremia 09/26/2021  ? Abnormal LFTs 09/26/2021  ? Dysfunctional uterine bleeding 09/26/2021  ? Uterine fibroid 09/19/2021  ? 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 Essential hypertension 09/16/2021  ? Hyperlipidemia 09/16/2021  ? Seizure ([place]) 09/16/2021  ? Aspiration pneumonia ([place]) 09/16/2021  ? Sickle cell disease ([place]) 09/16/2021  ? Iron deficiency anemia 09/16/2021  ? Cerebral edema ([place]) 09/10/2021  ? Acute metabolic encephalopathy 42/59/5638  ? ICH (intracerebral hemorrhage) ([place]) 09/10/2021  ? ? ?ONSET DATE: 09-10-21 ? ?REFERRING DIAG: R-CVA  ? ?THERAPY DIAG: Cognitive communication deficit ? ?SUBJECTIVE: "I'm going backwards. I went to [place]" ? ?PAIN:  ?Are you having pain? Yes  ?Pain Level: 1/10 ?Location: Left arm  ? ?OBJECTIVE:  ? ?TODAY'S TREATMENT:  ?11-13-21: Pt returned to [place] after 1 week due to change in medical condition and medication changes following visit to [employer]. Seemingly good recall exhibited when compared to available medical documentation. Pt able to locate pertinent dates with  use of external aids. Some cognitive fatigue indicated given extended duration. Pt continues with independent medication management with pt reporting good error awareness given recent medication changes. Pt now paying bills with no missed dates and good awareness of changes (increased usage related to increased bills). Education provided re: energy conservation and cognitive fatigue. Pt verbalized understanding.  ? ?10-31-21: No HEP completed last night due to pain following ST session yesterday. Pt returned with HEP per SLP recommendation. Targeted additional functional cognitive linguistic tasks (calculations and sequencing), in which pt able to complete with 88% with rare cues. Min to mod cues x2 required to aid comprehension and accuracy. Pt exhibited improving attention and awareness for detail oriented tasks. Good alternating attention exhibited between task and side conversation. HEP provided with recommendation to double check and reading directions twice.   ? ?10-30-21: Increased pain reported today but agreeable to ST session. Pt did not bring pill box but apparently filled out at home with success (daughter reportedly doubled check with no errors found). Pt able to recall strategies x2 to ensure accuracy (reading medication bottle and monitoring physical appearance of pills). No missed appointments reported with success using MyChart to aid appointment management. SLP targeted functional calculation tasks related to cognitive skills required at work (time management, accuracy with proportions, etc). Pt able to complete functional calculation task with 100% accuracy given mild extra processing time.  ? ?10-24-21:  Pt reported headache/migraine today. Pain reduced with medication and pt agreeable to therapy. Pt is consistently writing down information/taking pictures to aid recall. Pt aware of errors x2 on second attempt to login into MyChart given rare min A. SLP educated orientation and usage of MyChart for  appointment management. Good recall of upcoming visits exhibited with initial repetition and no need for visual aid. SLP targeted pill box task to ID errors, in which pt completed with 100% accuracy given no cues. Pt will bring in pill box/medications and bill next session to target functional problem solving and attention. No difficulty reading exhibited today (given larger font) with good comprehension noted.  ?  ?PATIENT EDUCATION: ?Education details: see above ?Person educated: Patient ?Education method: Explanation and Demonstration ?Education comprehension: verbalized understanding, returned demonstration, and needs further education ?  ?  ?  ?  ?GOALS: ?Goals reviewed with patient? Yes ?  ?SHORT TERM GOALS: Target date: 11/16/2021 ?  ?Pt will complete CLQT and PROM in first 1-2 ST sessions  ?Baseline:  ?Goal status: MET ?  ?2.  Pt will recall and implement 2 attention/memory compensations to aid daily functioning and completion of iADLs with occasional min A over 2 sessions ?Baseline: 10-24-21, 11-13-21 ?Goal status: MET ?  ?3.  Pt will use phone and/or calendar for orientation/time management given rare min A over 2 sessions  ?Baseline: 10-24-21, 10-30-21 ?Goal status: MET ?  ?4. Pt will complete functional iADL tasks (medication/financial management tasks) with 80% accuracy given occasional min A over 2 sessions  ?Baseline: 10-24-21, 11-13-21 ?Goal status: MET ?  ?5.  Pt will maintain sustained and focused attention for 15+ minutes for cognitive linguistic tasks given occasional min A over 2 sessions  ?Baseline: 10-24-21, 10-30-21 ?Goal status: MET ?  ?  ?LONG TERM GOALS: Target date: 12/14/2021 ?  ?Pt will recall and implement 4 attention/memory compensations to aid daily functioning and completion of iADLs with rare min A over 2 sessions ?Baseline:  ?Goal status: ONGOING ?  ?2.  Pt will complete functional iADL tasks (medication/financial management tasks) at home given rare min A over 2 sessions  ?Baseline:  ?Goal  status: ONGOING ?  ?3.  Pt will maintain sustained and focused attention for 30+ minutes for cognitive linguistic tasks given rare min A over 2 sessions  ?Baseline:  ?Goal status: ONGOING ?  ?4.  Pt will report improved cognitive linguistic functioning via PROM by 2 points at last ST session  ?Baseline: CF=109 ?Goal status: ONGOING ?  ?ASSESSMENT: ?  ?CLINICAL IMPRESSION: ?Patient is a 51 y.o. 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 Conducted ongoing education and training of compensatory techniques and cognitive linguistic tasks to aid recall/attention and completion of functional household tasks to optimize independence.  ?  ?OBJECTIVE IMPAIRMENTS include attention, memory, awareness, and executive functioning. These impairments are limiting patient from managing medications, managing appointments, household responsibilities, and ADLs/IADLs. ?Factors affecting potential to achieve goals and functional outcome are family/community support. Patient will benefit from skilled SLP services to address above impairments and improve overall function. ?  ?REHAB POTENTIAL: Good ?  ?PLAN: ?SLP FREQUENCY: 2x/week ?  ?SLP DURATION: 8 weeks ?  ?PLANNED INTERVENTIONS: Cognitive reorganization, Internal/external aids, Functional tasks, SLP instruction and feedback, Compensatory strategies, and Patient/family education ? ? ?Trixie Deis  Johnson, CCC-SLP ?11/13/2021, 12:37 PM ? ? ?

## 2021-11-14 ENCOUNTER — Ambulatory Visit (INDEPENDENT_AMBULATORY_CARE_PROVIDER_SITE_OTHER): Payer: 59 | Admitting: Family

## 2021-11-14 ENCOUNTER — Ambulatory Visit (INDEPENDENT_AMBULATORY_CARE_PROVIDER_SITE_OTHER): Payer: 59

## 2021-11-14 VITALS — BP 114/78 | HR 70 | Temp 98.5°F | Resp 18 | Ht 59.02 in | Wt 136.0 lb

## 2021-11-14 DIAGNOSIS — M25512 Pain in left shoulder: Secondary | ICD-10-CM

## 2021-11-14 DIAGNOSIS — I82451 Acute embolism and thrombosis of right peroneal vein: Secondary | ICD-10-CM | POA: Diagnosis not present

## 2021-11-14 DIAGNOSIS — Z0100 Encounter for examination of eyes and vision without abnormal findings: Secondary | ICD-10-CM

## 2021-11-14 DIAGNOSIS — G08 Intracranial and intraspinal phlebitis and thrombophlebitis: Secondary | ICD-10-CM | POA: Diagnosis not present

## 2021-11-14 DIAGNOSIS — I619 Nontraumatic intracerebral hemorrhage, unspecified: Secondary | ICD-10-CM | POA: Diagnosis not present

## 2021-11-14 NOTE — Progress Notes (Signed)
Pt presents to establish care and emergency dept f/u, pt states she still has pain on her left side  ?

## 2021-11-15 ENCOUNTER — Ambulatory Visit: Payer: 59

## 2021-11-15 ENCOUNTER — Ambulatory Visit: Payer: 59 | Admitting: Occupational Therapy

## 2021-11-15 ENCOUNTER — Telehealth: Payer: Self-pay | Admitting: Hematology and Oncology

## 2021-11-15 DIAGNOSIS — R41841 Cognitive communication deficit: Secondary | ICD-10-CM | POA: Diagnosis not present

## 2021-11-15 NOTE — Telephone Encounter (Signed)
Scheduled appt per 4/26 referral. Pt is aware of appt date and time. Pt is aware to arrive 15 mins prior to appt time and to bring and updated insurance card. Pt is aware of appt location.   ?

## 2021-11-15 NOTE — Therapy (Signed)
?OUTPATIENT SPEECH LANGUAGE PATHOLOGY TREATMENT NOTE ? ? ?Patient Name: April Reilly ?MRN: [id number] ?DOB:Apr 11, 1971, 51 y.o., female ?Today's Date: 11/15/2021 ? ?PCP: Pcp, No ?REFERRING PROVIDER: Izora Ribas, MD ? ?END OF SESSION:  ? End of Session - 11/15/21 1102   ? ? Visit Number 7   ? Number of Visits 17   ? Date for SLP Re-Evaluation 12/14/21   ? Authorization Type [facility]   ? Authorization Time Period needs auth after 25th ST visit   ? SLP Start Time 1102   ? SLP Stop Time  1145   ? SLP Time Calculation (min) 43 min   ? Activity Tolerance Patient tolerated treatment well   ? ?  ?  ? ?  ? ? ? ? ?Past Medical History:  ?Diagnosis Date  ? Asthma   ? Vaginal bleeding, abnormal   ? for 4 months  ? ?Past Surgical History:  ?Procedure Laterality Date  ? FOOT SURGERY    ? TUBAL LIGATION    ? ?Patient Active Problem List  ? Diagnosis Date Noted  ? 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 Hyponatremia 09/26/2021  ? Abnormal LFTs 09/26/2021  ? Dysfunctional uterine bleeding 09/26/2021  ? Uterine fibroid 09/19/2021  ? 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 Essential hypertension 09/16/2021  ? Hyperlipidemia 09/16/2021  ? Seizure ([place]) 09/16/2021  ? Aspiration pneumonia ([place]) 09/16/2021  ? Sickle cell disease ([place]) 09/16/2021  ? Iron deficiency anemia 09/16/2021  ? Cerebral edema ([place]) 09/10/2021  ? Acute metabolic encephalopathy 06/07/3566  ? ICH (intracerebral hemorrhage) ([place]) 09/10/2021  ? Reactive airway disease without complication 01/41/0301  ? ? ?ONSET DATE: 09-10-21 ? ?REFERRING DIAG: R-CVA  ? ?THERAPY DIAG: Cognitive communication deficit ? ?SUBJECTIVE: "I got confused on the appointment times" ?PAIN:  ?Are you having pain? Yes  ?Pain Level: 2/10 ?Location: Left arm  ? ?OBJECTIVE:  ? ?TODAY'S TREATMENT:  ?11-15-21: Some confusion and frustration reported re: mistaking therapy appointment times today. SLP re-directed patient to external aids to reduce confusion, in which pt able to identify  error with mod I. Pt inquired about energy conservation techniques with SLP providing education and recommendations to optimize cognitive and physical endurance as tolerated. SLP educated other external aids and compensatory techniques to aid recall as pt forgot HEP at home, such as reminders in phone, repetition, and mental picture strategies. SLP modeling and instruction was beneficial to aid understanding and carryover.  ? ?11-13-21: Pt returned to [facility] after 1 week due to change in medical condition and medication changes following visit to [employer]. Seemingly good recall exhibited when compared to available medical documentation. Pt able to locate pertinent dates with use of external aids. Some cognitive fatigue indicated given extended duration. Pt continues with independent medication management with pt reporting good error awareness given recent medication changes. Pt now paying bills with no missed dates and good awareness of changes (increased usage related to increased bills). Education provided re: energy conservation and cognitive fatigue. Pt verbalized understanding.  ? ?10-31-21: No HEP completed last night due to pain following ST session yesterday. Pt returned with HEP per SLP recommendation. Targeted additional functional cognitive linguistic tasks (calculations and sequencing), in which pt able to complete with 88% with rare cues. Min to mod cues x2 required to aid comprehension and accuracy. Pt exhibited improving attention and awareness for detail oriented tasks. Good alternating attention exhibited between task and side conversation. HEP provided with recommendation to double check and reading directions twice.   ? ?  10-30-21: Increased pain reported today but agreeable to ST session. Pt did not bring pill box but apparently filled out at home with success (daughter reportedly doubled check with no errors found). Pt able to recall strategies x2 to ensure accuracy (reading medication bottle and  monitoring physical appearance of pills). No missed appointments reported with success using MyChart to aid appointment management. SLP targeted functional calculation tasks related to cognitive skills required at work (time management, accuracy with proportions, etc). Pt able to complete functional calculation task with 100% accuracy given mild extra processing time.  ? ?10-24-21: Pt reported headache/migraine today. Pain reduced with medication and pt agreeable to therapy. Pt is consistently writing down information/taking pictures to aid recall. Pt aware of errors x2 on second attempt to login into MyChart given rare min A. SLP educated orientation and usage of MyChart for appointment management. Good recall of upcoming visits exhibited with initial repetition and no need for visual aid. SLP targeted pill box task to ID errors, in which pt completed with 100% accuracy given no cues. Pt will bring in pill box/medications and bill next session to target functional problem solving and attention. No difficulty reading exhibited today (given larger font) with good comprehension noted.  ?  ?PATIENT EDUCATION: ?Education details: see above ?Person educated: Patient ?Education method: Explanation and Demonstration ?Education comprehension: verbalized understanding, returned demonstration, and needs further education  ?  ?  ?GOALS: ?Goals reviewed with patient? Yes ?  ?SHORT TERM GOALS: Target date: 11/16/2021 ?  ?Pt will complete CLQT and PROM in first 1-2 ST sessions  ?Baseline:  ?Goal status: MET ?  ?2.  Pt will recall and implement 2 attention/memory compensations to aid daily functioning and completion of iADLs with occasional min A over 2 sessions ?Baseline: 10-24-21, 11-13-21 ?Goal status: MET ?  ?3.  Pt will use phone and/or calendar for orientation/time management given rare min A over 2 sessions  ?Baseline: 10-24-21, 10-30-21 ?Goal status: MET ?  ?4. Pt will complete functional iADL tasks (medication/financial management  tasks) with 80% accuracy given occasional min A over 2 sessions  ?Baseline: 10-24-21, 11-13-21 ?Goal status: MET ?  ?5.  Pt will maintain sustained and focused attention for 15+ minutes for cognitive linguistic tasks given occasional min A over 2 sessions  ?Baseline: 10-24-21, 10-30-21 ?Goal status: MET ?  ?  ?LONG TERM GOALS: Target date: 12/14/2021 ?  ?Pt will recall and implement 4 attention/memory compensations to aid daily functioning and completion of iADLs with rare min A over 2 sessions ?Baseline: 11-15-21 ?Goal status: ONGOING ?  ?2.  Pt will complete functional iADL tasks (medication/financial management tasks) at home given rare min A over 2 sessions  ?Baseline:  ?Goal status: ONGOING ?  ?3.  Pt will maintain sustained and focused attention for 30+ minutes for cognitive linguistic tasks given rare min A over 2 sessions  ?Baseline: 11-15-21 ?Goal status: ONGOING ?  ?4.  Pt will report improved cognitive linguistic functioning via PROM by 2 points at last ST session  ?Baseline: CF=109 ?Goal status: ONGOING ?  ?ASSESSMENT: ?  ?CLINICAL IMPRESSION: ?Patient is a 52 y.o. 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 Conducted ongoing education and training of compensatory techniques and cognitive linguistic tasks to aid recall/attention and completion of functional household tasks to optimize independence. Good carryover and implementation of recommended techniques reported for increased functional independence and safety.   ?  ?OBJECTIVE IMPAIRMENTS include attention, memory, awareness, and executive functioning.  These impairments are limiting patient from managing medications, managing appointments, household responsibilities, and ADLs/IADLs. ?Factors affecting potential to achieve goals and functional outcome are family/community support. Patient will benefit from skilled SLP services to address above impairments and improve overall function. ?  ?REHAB POTENTIAL: Good ?   ?PLAN: ?SLP FREQUENCY: 2x/week ?  ?SLP DURATION: 8 weeks ?  ?PLANNED INTERVENTIONS: Cognitive reorganization, Internal/external aids, Functional tasks, SLP instruction and feedback, Compensatory strategies,

## 2021-11-15 NOTE — Progress Notes (Signed)
Left shoulder no fracture and no dislocation.

## 2021-11-15 NOTE — Patient Instructions (Signed)
Use Reminders Section in Your Phone: ?1) Find "Reminders" app (it is white with three colored buttons) ?2) Select "New Reminder" in blue at bottom of screen ?3) Type what you need to remember  ?4) Select "Details" below ?5) Select the date you needed to be reminded  ?6) Select the time you need to be reminded  ?7) Select "Add" at top right side of screen  ?

## 2021-11-19 ENCOUNTER — Other Ambulatory Visit: Payer: Self-pay | Admitting: Neurology

## 2021-11-19 ENCOUNTER — Ambulatory Visit
Admission: RE | Admit: 2021-11-19 | Discharge: 2021-11-19 | Disposition: A | Discharge status: Home / Self Care | Payer: 59 | Source: Ambulatory Visit | Attending: Neurology | Admitting: Neurology

## 2021-11-19 ENCOUNTER — Telehealth: Payer: Self-pay | Admitting: Neurology

## 2021-11-19 DIAGNOSIS — G08 Intracranial and intraspinal phlebitis and thrombophlebitis: Secondary | ICD-10-CM

## 2021-11-19 DIAGNOSIS — I618 Other nontraumatic intracerebral hemorrhage: Secondary | ICD-10-CM

## 2021-11-19 MED ORDER — APIXABAN 5 MG PO TABS
5.0000 mg | ORAL_TABLET | Freq: Two times a day (BID) | ORAL | 3 refills | Status: DC
  Filled 2021-12-28: qty 60, 30d supply, fill #0

## 2021-11-19 NOTE — Progress Notes (Signed)
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 She has been doing the injections for the past 5 weeks, she established care with PMD who referred her to hematology but has not seen them yet. She  has 2 doses left.  ?I recommended her to return to Lovenox PO after she finishes her injection and plan will be to continue anticoagulation for a total of 6 months.  ?She is pending a repeat CT head today. She will follow up with me on June 5th.  ? ?Alric Ran, MD  ?

## 2021-11-19 NOTE — Telephone Encounter (Signed)
Done

## 2021-11-19 NOTE — Telephone Encounter (Signed)
April Reilly with [facility] calling concerning Pt CT scan which was scheduled today 11/19/2021.  ?G.I was not able to do insert  IV for contrast study on pt.  ?Needs to get done at hospital or another facility to do ultrasound guided IV.  ?Pt would need referral for new facility.   ?

## 2021-11-20 ENCOUNTER — Telehealth: Payer: Self-pay | Admitting: Neurology

## 2021-11-20 ENCOUNTER — Ambulatory Visit: Payer: 59 | Admitting: Occupational Therapy

## 2021-11-20 NOTE — Telephone Encounter (Signed)
[facility] ?[facility] no auth req- sent to [facility], they will reach out to the patient to schedule.  ?

## 2021-11-20 NOTE — Telephone Encounter (Signed)
Noted, thank you. The orders were sent to [facility] they will reach out to the patient to schedule.  ?

## 2021-11-22 ENCOUNTER — Ambulatory Visit: Payer: 59 | Admitting: Occupational Therapy

## 2021-11-22 ENCOUNTER — Ambulatory Visit: Payer: 59 | Attending: Physical Medicine and Rehabilitation

## 2021-11-22 DIAGNOSIS — R41844 Frontal lobe and executive function deficit: Secondary | ICD-10-CM

## 2021-11-22 DIAGNOSIS — R41841 Cognitive communication deficit: Secondary | ICD-10-CM | POA: Insufficient documentation

## 2021-11-22 DIAGNOSIS — R2681 Unsteadiness on feet: Secondary | ICD-10-CM | POA: Diagnosis present

## 2021-11-22 DIAGNOSIS — M6281 Muscle weakness (generalized): Secondary | ICD-10-CM | POA: Diagnosis present

## 2021-11-22 DIAGNOSIS — R41840 Attention and concentration deficit: Secondary | ICD-10-CM

## 2021-11-22 DIAGNOSIS — M25512 Pain in left shoulder: Secondary | ICD-10-CM | POA: Diagnosis present

## 2021-11-22 DIAGNOSIS — R278 Other lack of coordination: Secondary | ICD-10-CM | POA: Diagnosis present

## 2021-11-22 NOTE — Patient Instructions (Signed)
SHOULDER: Flexion On Table ? ? ? ?Place hands on table, elbows straight. Slide towel  forwards and backwards.  Press hands down into table. Hold __5_ seconds. __10_ reps per set, __2_ sets per day, _7__ days per week ? ?Can perform sliding washcloth along broom stick ? ? ?Copyright ? VHI. All rights reserved.  ? ? ? ?SCAPULA: Retraction ? ? ? ?Hold use paper towel roll between hands with both hands. Pinch shoulder blades together. Do not shrug shoulders. Hold __5_ seconds. __10_ reps per set, _2__ sets per day, __7_ days per week ? ?Copyright ? VHI. All rights reserved.  ? ?

## 2021-11-22 NOTE — Therapy (Signed)
? ?OUTPATIENT OCCUPATIONAL THERAPY NEURO Treatment ? ?Patient Name: April Reilly ?MRN: [id number] ?DOB:[birth year], 51 y.o., female ?Today's Date: 11/22/2021 ? ?PCP: Pcp, No ?REFERRING PROVIDER: Izora Ribas, MD ? ? OT End of Session - 11/22/21 1351   ? ? Visit Number 5   ? Number of Visits 25   ? Date for OT Re-Evaluation 01/07/22   ? OT Start Time 1104   ? OT Stop Time 1144   ? OT Time Calculation (min) 40 min   ? Activity Tolerance Patient tolerated treatment well   ? ?  ?  ? ?  ? ? ? ? ?Past Medical History:  ?Diagnosis Date  ? Asthma   ? Vaginal bleeding, abnormal   ? for 4 months  ? ?Past Surgical History:  ?Procedure Laterality Date  ? FOOT SURGERY    ? TUBAL LIGATION    ? ?Patient Active Problem List  ? Diagnosis Date Noted  ? 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 Hyponatremia 09/26/2021  ? Abnormal LFTs 09/26/2021  ? Dysfunctional uterine bleeding 09/26/2021  ? Uterine fibroid 09/19/2021  ? 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 Essential hypertension 09/16/2021  ? Hyperlipidemia 09/16/2021  ? Seizure ([place]) 09/16/2021  ? Aspiration pneumonia ([place]) 09/16/2021  ? Sickle cell disease ([place]) 09/16/2021  ? Iron deficiency anemia 09/16/2021  ? Cerebral edema ([place]) 09/10/2021  ? Acute metabolic encephalopathy 24/03/7352  ? ICH (intracerebral hemorrhage) ([place]) 09/10/2021  ? Reactive airway disease without complication 29/92/4268  ? ? ?ONSET DATE: 09/10/21 ? ?REFERRING DIAG: ICH-R frontal, parietal, occipital and temporal hemorrhage ? ?THERAPY DIAG:  ?Muscle weakness (generalized) ? ?Unsteadiness on feet ? ?Other lack of coordination ? ?Frontal lobe and executive function deficit ? ?Attention and concentration deficit ? ?Acute pain of left shoulder ? ?SUBJECTIVE:  ? ?SUBJECTIVE STATEMENT: ?Pt reports left shoulder pain ?Pt accompanied by: self ? ?PERTINENT HISTORY: 51 y.o. female admitted to [facility] on 09/10/2021 after fall out of bed followed by AMS. CT: significant edema in R frontal, parietal,  occipital and temporal lobe with superimposed hemorrhage, . PMH includes sickle cell trait, anemia. Pt. d/c home on 09/26/21 following CIR.  ? ?PRECAUTIONS: Other: sickle cell , seizures, no driving, no work ? ?WEIGHT BEARING RESTRICTIONS No ? ?PAIN:  ?Are you having pain? Yes: NPRS scale: 6/10 ?Pain location: left shoulder ?Pain description: aching ?Aggravating factors: movement ?Relieving factors: Not moving it , heat ? ?FALLS: Has patient fallen in last 6 months? No,  ? ?LIVING ENVIRONMENT: ?Lives with: alone ?Lives in: House/apartment ?Stairs: Yes: External: 3 steps; none ?Has following equipment at home: Shower bench ? ?PLOF: Independent, head chef at [employer] ? ? ?PATIENT GOALS decrease shoulder pain ? ? ?Treatment: Supine on mat, scapular and shoulder retraction, min facilltation, closed chain chest press and shoulder flexion in supine, however pt reports increased shoulder pain, so activity was discontinued ?Sidelying on left side, A/ROM elbow flexion/ extension and forearm supination/ pronation, min v.c ?Seated low range closed chain chest press, min v.c thenlow range shoulder flexion, A/ROM holding vertical pole, then along pole at an incline, min v.c for positioning and to avoid shoulder hike. ?Pt reports decreased pain end of session. ? ? ?PATIENT EDUCATION: ?N/A ?  ?SHORT TERM GOALS: Target date: 12/20/2021 ? ?I with initial HEP ? ?Goal status: INITIAL ? ?2.  Pt will demonstrate ability to retrieve a lightweight object at 90* with no more than min compensations and pain no greater than 3/10.Marland Kitchen ?Baseline: 85* ?Goal status: INITIAL ? ?3.  Pt  will perform basic home management with supervision demonstrating good safety awareness. ?Baseline:dependent ?Goal status: INITIAL ? ?4.  Pt will perform basic cooking with no more than min v.c/ min A demonstrating good safety awareness. 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 ?5.  Pt will perform a  a simple functional organization task such as generating  a menu and grocery  list with     no more than min v.c ? ?Goal status: INITIAL ? ?6.  Pt will increase LUE grip strength by 5 lbs for increased LUE functional use. ?Baseline: 44.7 lbs ?Goal status: INITIAL ? ? 7. Pt will perform all basic ADLS modified independently ? ?LONG TERM GOALS: Target date:01/07/22 ? ?I with updated HEP ? ?Goal status: INITIAL ? ?2.  Pt will perform mod complex home management mod I  ? ?Goal status: INITIAL ? ?3.  Pt will perform mod complex cooking modified independently demonstrating good safety awareness. ? ?Goal status: INITIAL ? ?4.  Pt will perform simulated work activities mod I ? ?Goal status: INITIAL ? ?5.  Pt will retrieve a light weight object at 110* shoulder flexion with pain no greater than 3/10. ? ?Goal status: INITIAL ?6. Pt will demonstrate ability to perform a physical and cognitive task simultaneously with 90%of better accuracy. ?Goal status: initial  ? ?8. Pt will perform tabletop and environmental scanning tasks with 90% or better accuracy. ?Goal status: Initial ? ?ASSESSMENT: ? ?CLINICAL IMPRESSION: ?Pt is progressing towards goals slowly. Pt is limited by left shoulder pain. ?PRECAUTIONS: Other: sickle cell ? ?PERFORMANCE DEFICITS in functional skills including ADLs, IADLs, coordination, dexterity, ROM, strength, pain, flexibility, FMC, GMC, mobility, balance, decreased knowledge of precautions, decreased knowledge of use of DME, and UE functional use, cognitive skills including attention, energy/drive, learn, memory, problem solving, safety awareness, sequencing, thought, and understand, and psychosocial skills including coping strategies and routines and behaviors.  ? ?IMPAIRMENTS are limiting patient from ADLs, IADLs, rest and sleep, work, play, leisure, and social participation.  ? ?COMORBIDITIES may have co-morbidities  that affects occupational performance. Patient will benefit from skilled OT to address above impairments and improve overall  function. ? ?MODIFICATION OR ASSISTANCE TO COMPLETE EVALUATION: No modification of tasks or assist necessary to complete an evaluation. ? ?OT OCCUPATIONAL PROFILE AND HISTORY: Detailed assessment: Review of records and additional review of physical, cognitive, psychosocial history related to current functional performance. ? ?CLINICAL DECISION MAKING: LOW - limited treatment options, no task modification necessary ? ?REHAB POTENTIAL: Good ? ?EVALUATION COMPLEXITY: Moderate ? ? ? ?PLAN: ?OT FREQUENCY: 2x/week ? ?OT DURATION: 12 weeks plus eval- (POC written for 12 weeks to account for scheduling, anticipate d/c after 8 weeks dependent on progress.) ? ?PLANNED INTERVENTIONS: self care/ADL training, therapeutic exercise, therapeutic activity, neuromuscular re-education, manual therapy, passive range of motion, balance training, functional mobility training, aquatic therapy, electrical stimulation, ultrasound, paraffin, fluidotherapy, moist heat, cryotherapy, patient/family education, cognitive remediation/compensation, visual/perceptual remediation/compensation, energy conservation, coping strategies training, and DME and/or AE instructions ? ?RECOMMENDED OTHER SERVICES: PT, ST ? ?CONSULTED AND AGREED WITH PLAN OF CARE: Patient ? ?PLAN FOR NEXT SESSION: monitor pain, review HEP, consider simple cooking task ? ? ?Fran Mcree, OT ?11/22/2021, 1:54 PM ?Theone Murdoch, OTR/L ?[phone] ?Phone: [phone] ?1:54 PM 11/22/21  ? ? ? ? ? ? ?   ?

## 2021-11-22 NOTE — Therapy (Signed)
?OUTPATIENT SPEECH LANGUAGE PATHOLOGY TREATMENT NOTE ? ? ?Patient Name: April Reilly ?MRN: [id number] ?DOB:1970/09/03, 51 y.o., female ?Today's Date: 11/22/2021 ? ?PCP: Pcp, No ?REFERRING PROVIDER: Izora Ribas, MD ? ?END OF SESSION:  ? End of Session - 11/22/21 1024   ? ? Visit Number 8   ? Number of Visits 17   ? Date for SLP Re-Evaluation 12/14/21   ? Authorization Type [facility]   ? Authorization Time Period needs auth after 25th ST visit   ? SLP Start Time 1022   pt arrived late  ? SLP Stop Time  1100   ? SLP Time Calculation (min) 38 min   ? Activity Tolerance Patient tolerated treatment well   ? ?  ?  ? ?  ? ? ? ? ?Past Medical History:  ?Diagnosis Date  ? Asthma   ? Vaginal bleeding, abnormal   ? for 4 months  ? ?Past Surgical History:  ?Procedure Laterality Date  ? FOOT SURGERY    ? TUBAL LIGATION    ? ?Patient Active Problem List  ? Diagnosis Date Noted  ? 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 Hyponatremia 09/26/2021  ? Abnormal LFTs 09/26/2021  ? Dysfunctional uterine bleeding 09/26/2021  ? Uterine fibroid 09/19/2021  ? 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 Essential hypertension 09/16/2021  ? Hyperlipidemia 09/16/2021  ? Seizure ([place]) 09/16/2021  ? Aspiration pneumonia ([place]) 09/16/2021  ? Sickle cell disease ([place]) 09/16/2021  ? Iron deficiency anemia 09/16/2021  ? Cerebral edema ([place]) 09/10/2021  ? Acute metabolic encephalopathy 90/30/0923  ? ICH (intracerebral hemorrhage) ([place]) 09/10/2021  ? Reactive airway disease without complication 30/01/6225  ? ? ?ONSET DATE: 09-10-21 ? ?REFERRING DIAG: R-CVA  ? ?THERAPY DIAG: Cognitive communication deficit ? ?SUBJECTIVE: "I was greedy. I got [employer] so I was late" ? ?PAIN:  ?Are you having pain? Yes  ?Pain Level: 5/10 ?Location: Left arm  ? ?OBJECTIVE:  ? ?TODAY'S TREATMENT:  ?11-22-21: Pt arrived late and explained rationale for lateness (not related to recall). Pt continues to c/o increased pain and vision changes as well as increased  nausea and moodiness. SLP recommended Magnifier app on phone to aid vision, which was successful. Reminder app on phone was successful to recall of functional tasks and return HEP. HEP was partially completed, which pt recognized today. Instructed patient to complete remainder of hwk. Pt has not consistently filled out pill organizer lately due to vision concerns and requested daughter assistance for ensure accuracy. SLP cued use of MyChart to locate upcoming CT scan. Pt able to recall targeted task with extra processing time.   ? ?11-15-21: Some confusion and frustration reported re: mistaking therapy appointment times today. SLP re-directed patient to external aids to reduce confusion, in which pt able to identify error with mod I. Pt inquired about energy conservation techniques with SLP providing education and recommendations to optimize cognitive and physical endurance as tolerated. SLP educated other external aids and compensatory techniques to aid recall as pt forgot HEP at home, such as reminders in phone, repetition, and mental picture strategies. SLP modeling and instruction was beneficial to aid understanding and carryover.  ? ?11-13-21: Pt returned to [place] after 1 week due to change in medical condition and medication changes following visit to [employer] [facility]. Seemingly good recall exhibited when compared to available medical documentation. Pt able to locate pertinent dates with use of external aids. Some cognitive fatigue indicated given extended duration. Pt continues with independent medication management with pt reporting good error  awareness given recent medication changes. Pt now paying bills with no missed dates and good awareness of changes (increased usage related to increased bills). Education provided re: energy conservation and cognitive fatigue. Pt verbalized understanding.  ? ?10-31-21: No HEP completed last night due to pain following ST session yesterday. Pt returned with HEP per SLP  recommendation. Targeted additional functional cognitive linguistic tasks (calculations and sequencing), in which pt able to complete with 88% with rare cues. Min to mod cues x2 required to aid comprehension and accuracy. Pt exhibited improving attention and awareness for detail oriented tasks. Good alternating attention exhibited between task and side conversation. HEP provided with recommendation to double check and reading directions twice.   ? ? ?PATIENT EDUCATION: ?Education details: see above ?Person educated: Patient ?Education method: Explanation and Demonstration ?Education comprehension: verbalized understanding, returned demonstration, and needs further education  ?  ?  ?GOALS: ?Goals reviewed with patient? Yes ?  ?SHORT TERM GOALS: Target date: 11/16/2021 ?  ?Pt will complete CLQT and PROM in first 1-2 ST sessions  ?Baseline:  ?Goal status: MET ?  ?2.  Pt will recall and implement 2 attention/memory compensations to aid daily functioning and completion of iADLs with occasional min A over 2 sessions ?Baseline: 10-24-21, 11-13-21 ?Goal status: MET ?  ?3.  Pt will use phone and/or calendar for orientation/time management given rare min A over 2 sessions  ?Baseline: 10-24-21, 10-30-21 ?Goal status: MET ?  ?4. Pt will complete functional iADL tasks (medication/financial management tasks) with 80% accuracy given occasional min A over 2 sessions  ?Baseline: 10-24-21, 11-13-21 ?Goal status: MET ?  ?5.  Pt will maintain sustained and focused attention for 15+ minutes for cognitive linguistic tasks given occasional min A over 2 sessions  ?Baseline: 10-24-21, 10-30-21 ?Goal status: MET ?  ?  ?LONG TERM GOALS: Target date: 12/14/2021 ?  ?Pt will recall and implement 4 attention/memory compensations to aid daily functioning and completion of iADLs with rare min A over 2 sessions ?Baseline: 11-15-21, 11-22-21 ?Goal status: MET ?  ?2.  Pt will complete functional iADL tasks (medication/financial management tasks) at home given rare min  A over 2 sessions  ?Baseline:  ?Goal status: ONGOING ?  ?3.  Pt will maintain sustained and focused attention for 30+ minutes for cognitive linguistic tasks given rare min A over 2 sessions  ?Baseline: 11-15-21 ?Goal status: ONGOING ?  ?4.  Pt will report improved cognitive linguistic functioning via PROM by 2 points at last ST session  ?Baseline: CF=109 ?Goal status: ONGOING ?  ?ASSESSMENT: ?  ?CLINICAL IMPRESSION: ?Patient is a 52 y.o. 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 Conducted ongoing education and training of compensatory techniques, external aids, and cognitive linguistic tasks to aid recall/attention and completion of functional household tasks to optimize independence. Good carryover and implementation of recommended techniques reported for increased functional independence and safety.   ?  ?OBJECTIVE IMPAIRMENTS include attention, memory, awareness, and executive functioning. These impairments are limiting patient from managing medications, managing appointments, household responsibilities, and ADLs/IADLs. ?Factors affecting potential to achieve goals and functional outcome are family/community support. Patient will benefit from skilled SLP services to address above impairments and improve overall function. ?  ?REHAB POTENTIAL: Good ?  ?PLAN: ?SLP FREQUENCY: 2x/week ?  ?SLP DURATION: 8 weeks ?  ?PLANNED INTERVENTIONS: Cognitive reorganization, Internal/external aids, Functional tasks, SLP instruction and feedback, Compensatory strategies, and Patient/family education ? ? ?Marzetta Board, CCC-SLP ?11/22/2021, 10:54 AM ? ? ?

## 2021-11-22 NOTE — Patient Instructions (Addendum)
Use "Magnifier" app on iPhone to help your vision  ? ?Look into alternative transportation options in [place] (there should be bus options). Write down your options and return to Speech Therapy  ? ? ? ? ? ? ? ? ? ?

## 2021-11-23 ENCOUNTER — Ambulatory Visit: Payer: 59

## 2021-11-23 ENCOUNTER — Ambulatory Visit: Payer: 59 | Admitting: Occupational Therapy

## 2021-11-26 ENCOUNTER — Other Ambulatory Visit: Payer: Self-pay | Admitting: Family

## 2021-11-26 ENCOUNTER — Other Ambulatory Visit (HOSPITAL_COMMUNITY): Payer: Self-pay

## 2021-11-26 ENCOUNTER — Other Ambulatory Visit: Payer: Self-pay | Admitting: Neurology

## 2021-11-26 MED ORDER — LEVETIRACETAM 500 MG PO TABS
500.0000 mg | ORAL_TABLET | Freq: Two times a day (BID) | ORAL | 3 refills | Status: DC
  Filled 2022-02-19: qty 60, 30d supply, fill #0

## 2021-11-26 MED ORDER — LEVETIRACETAM 500 MG PO TABS
500.0000 mg | ORAL_TABLET | Freq: Two times a day (BID) | ORAL | 3 refills | Status: DC
  Filled 2021-11-26: qty 180, 90d supply, fill #0
  Filled 2021-11-26: qty 10, 5d supply, fill #0
  Filled 2021-11-26: qty 170, 85d supply, fill #0

## 2021-11-26 NOTE — Telephone Encounter (Signed)
Pt would like to know if Dr April Manson would manage her levETIRAcetam (KEPPRA) 500 MG tablet.  Pt states she has been out of this since Friday of last week and she would like [id number] ?

## 2021-11-26 NOTE — Telephone Encounter (Signed)
I spoke with the patient. Her [facility] not prefer [facility]. Medication refills need to go to [facility]. ?

## 2021-11-26 NOTE — Telephone Encounter (Signed)
Medication Refill - Medication: levETIRAcetam (KEPPRA) 500 MG tablet ?Pt has been out for 4 days  ? ?Has the patient contacted their pharmacy? Yes.   ?(Agent: If no, request that the patient contact the pharmacy for the refill. If patient does not wish to contact the pharmacy document the reason why and proceed with request.) ?(Agent: If yes, when and what did the pharmacy advise?) ? ?Preferred Pharmacy (with phone number or street name):  ?[facility] Phone:  [phone]  ?Fax:  [phone]  ?  ? ?Has the patient been seen for an appointment in the last year OR does the patient have an upcoming appointment? Yes.   ? ?Agent: Please be advised that RX refills may take up to 3 business days. We ask that you follow-up with your pharmacy. ?

## 2021-11-26 NOTE — Telephone Encounter (Signed)
Pt's insurance company called [facility] to not refill my medication and that  Outpatient would be refill my Keppra. Would like a call from the nurse. ?

## 2021-11-26 NOTE — Telephone Encounter (Signed)
Requested medication (s) are due for refill today: yes ? ?Requested medication (s) are on the active medication list: yes ? ?Last refill:  10/19/21 #60/0 ? ?Future visit scheduled: no ? ?Notes to clinic:  unsure if pt is current pt. Rx was prescribed by another provider ? ? ?  ?Requested Prescriptions  ?Pending Prescriptions Disp Refills  ? levETIRAcetam (KEPPRA) 500 MG tablet 60 tablet 0  ?  Sig: Take 1 tablet (500 mg total) by mouth 2 (two) times daily.  ?  ? Neurology:  Anticonvulsants - levetiracetam Passed - 11/26/2021 11:48 AM  ?  ?  Passed - Cr in normal range and within 360 days  ?  Creatinine, Ser  ?Date Value Ref Range Status  ?10/19/2021 0.76 0.44 - 1.00 mg/dL Final  ?  ?  ?  ?  Passed - Completed PHQ-2 or PHQ-9 in the last 360 days  ?  ?  Passed - Valid encounter within last 12 months  ?  Recent Outpatient Visits   ? ?      ? 1 week ago Deep venous thrombosis (DVT) of right peroneal vein, unspecified chronicity ([place])  ? Primary Care at [facility], [place], NP  ? ?  ?  ? ? ?  ?  ?  ? ?

## 2021-11-27 ENCOUNTER — Encounter: Payer: Self-pay | Admitting: Hematology and Oncology

## 2021-11-27 ENCOUNTER — Other Ambulatory Visit (HOSPITAL_COMMUNITY): Payer: Self-pay

## 2021-11-27 ENCOUNTER — Other Ambulatory Visit: Payer: Self-pay

## 2021-11-27 ENCOUNTER — Inpatient Hospital Stay: Payer: 59

## 2021-11-27 ENCOUNTER — Inpatient Hospital Stay: Payer: 59 | Attending: Hematology and Oncology | Admitting: Hematology and Oncology

## 2021-11-27 VITALS — BP 142/89 | HR 73 | Temp 97.8°F | Resp 16 | Ht 59.0 in | Wt 136.6 lb

## 2021-11-27 DIAGNOSIS — Z7901 Long term (current) use of anticoagulants: Secondary | ICD-10-CM | POA: Diagnosis not present

## 2021-11-27 DIAGNOSIS — G08 Intracranial and intraspinal phlebitis and thrombophlebitis: Secondary | ICD-10-CM | POA: Diagnosis present

## 2021-11-27 DIAGNOSIS — Z9189 Other specified personal risk factors, not elsewhere classified: Secondary | ICD-10-CM

## 2021-11-27 NOTE — Progress Notes (Signed)
[facility] ?CONSULT NOTE ? ?Patient Care Team: ?Pcp, No as PCP - General ? ?CHIEF COMPLAINTS/PURPOSE OF CONSULTATION:  ?Cerebral venous sinus thrombosis ? ?ASSESSMENT & PLAN:  ? ?This is a very pleasant 51 year old female patient with past medical history significant for asthma who went to the urgent care with the abnormal prolonged menstrual bleeding.  She apparently was started on Megace when she went to the urgent care.  After she started Megace, she was admitted with malaise, seizures and encephalopathy.  She was found to have nonocclusive thrombus in the right distal transverse and sigmoid sinus.  We have discussed the following details about cerebral venous sinus thrombus. ? ?Cerebral venous sinus thrombus is a less common than most other types of stroke affecting females more than males and in general seen in younger age.  Most common risk factors that we notice are prothrombotic conditions, obesity oral contraceptives pregnancy and postpartum, presence of malignancy infection and head injury.  We have also seen several cases of CBT in the setting of COVID-19 infection.  And then occasionally we cannot find any underlying etiology risk factor which is called cryptogenic CVT. ? ?Clinically it can present with a headache, seizures, encephalopathy elevated intracranial pressure and some other focal presentation such as weakness. ? ?MRI brain which was done in February when she presented with encephalopathy showed filling defect in the distal right transverse sinus and proximal and mid right sigmoid sinus consistent with thrombus. ? ?I think it is appropriate to proceed with hypercoagulable work-up at this time.  I also agree with anticoagulation for about 6 months.  We have discussed about benefits versus risks of anticoagulation including life-threatening bleeding.  At this time however we believe benefits overweigh risks of anticoagulation.  Patient should continue to follow-up with neurology as  recommended. ? ?She said she saw a hematologist Dr. Lynann Bologna in [place], had multiple labs drawn.  She does not have the results with her.  She however will be able to obtain the labs and will fax it to [place].  She will also return to clinic in 2 weeks so we can discuss the results and if she needs any additional recommendations. ? ?Thank you for consulting [place] care of this patient.  Please not hesitate to contact us with any additional questions or concerns ? ?HISTORY OF PRESENTING ILLNESS:  ? ?Infantof Villagomez 51 y.o. female is here because of DVT/cerebral venous sinus thrombosis. ? ?This is a pleasant 51 year old female patient who initially went to the urgent care with abnormal vaginal bleeding started on Megace and then was admitted with 3-day history of malaise somnolence was found to have right hemorrhagic infarct, right lower lobe aspiration pneumonia.  She was also found to have subocclusive clot in the right distal transverse sinus right sigmoid dural venous sinus.  She was initially started on IV heparin, transition to Lovenox which she did not tolerate well and hence she is on Eliquis right now.  She has been tolerating Eliquis well.  She tells me that she she is quite disappointed that she cannot work cannot do many things but she used to work in Therapist, music at [employer].  She has poor vision now since her stroke, also has a follow-up with ophthalmology. ?She continues to have headaches, follows up with neurology, takes Keppra as recommended.  She has noticed some nausea when she wakes up in the morning.  Rest of the pertinent 10 point ROS reviewed and negative ? ?MEDICAL HISTORY:  ?Past Medical History:  ?  Diagnosis Date  ? Asthma   ? Vaginal bleeding, abnormal   ? for 4 months  ? ? ?SURGICAL HISTORY: ?Past Surgical History:  ?Procedure Laterality Date  ? FOOT SURGERY    ? TUBAL LIGATION    ? ? ?SOCIAL HISTORY: ?Social History  ? ?Socioeconomic History  ? Marital status: Single  ?  Spouse name: Not on file  ?  Number of children: Not on file  ? Years of education: Not on file  ? Highest education level: Not on file  ?Occupational History  ? Not on file  ?Tobacco Use  ? Smoking status: Never  ? Smokeless tobacco: Never  ?Substance and Sexual Activity  ? Alcohol use: Yes  ? Drug use: Never  ? Sexual activity: Yes  ?Other Topics Concern  ? Not on file  ?Social History Narrative  ? Not on file  ? ?Social Determinants of Health  ? ?Financial Resource Strain: Not on file  ?Food Insecurity: Not on file  ?Transportation Needs: Not on file  ?Physical Activity: Not on file  ?Stress: Not on file  ?Social Connections: Not on file  ?Intimate Partner Violence: Not on file  ? ? ?FAMILY HISTORY: ?Family History  ?Problem Relation Age of Onset  ? Clotting disorder Mother   ? ? ?ALLERGIES:  is allergic to megestrol. ? ?MEDICATIONS:  ?Current Outpatient Medications  ?Medication Sig Dispense Refill  ? ACETAMINOPHEN-BUTALBITAL 50-325 MG TABS Take 1 tablet by mouth 2 (two) times daily as needed.    ? apixaban (ELIQUIS) 5 MG TABS tablet Take 1 tablet (5 mg total) by mouth 2 (two) times daily. 60 tablet 4  ? atorvastatin (LIPITOR) 40 MG tablet Take 1 tablet (40 mg total) by mouth daily. 30 tablet 0  ? levETIRAcetam (KEPPRA) 500 MG tablet Take 1 tablet (500 mg total) by mouth 2 (two) times daily. 180 tablet 3  ? levETIRAcetam (KEPPRA) 500 MG tablet Take 1 tablet (500 mg total) by mouth 2 (two) times daily. 180 tablet 3  ? metoprolol tartrate (LOPRESSOR) 25 MG tablet Take 1/2 tablet (12.5 mg total) by mouth 2 (two) times daily. 30 tablet 0  ? PARoxetine (PAXIL) 10 MG tablet Take 1 tablet (10 mg total) by mouth daily. 30 tablet 0  ? topiramate (TOPAMAX) 25 MG tablet     ? traMADol (ULTRAM) 50 MG tablet Take 0.5 tablets (25 mg total) by mouth 2 (two) times daily as needed for moderate pain. 5 tablet 0  ? ?No current facility-administered medications for this visit.  ? ? ? ?PHYSICAL EXAMINATION: ?ECOG PERFORMANCE STATUS: 0 - Asymptomatic ? ?Vitals:   ? 11/27/21 1433  ?BP: (!) 142/89  ?Pulse: 73  ?Resp: 16  ?Temp: 97.8 ?F (36.6 ?C)  ?SpO2: 100%  ? ?Filed Weights  ? 11/27/21 1433  ?Weight: 136 lb 9.6 oz (62 kg)  ? ? ?GENERAL:alert, no distress and comfortable ?SKIN: skin color, texture, turgor are normal, no rashes or significant lesions ?EYES: normal, conjunctiva are pink and non-injected, sclera clear ?OROPHARYNX:no exudate, no erythema and lips, buccal mucosa, and tongue normal  ?NECK: supple, thyroid normal size, non-tender, without nodularity ?LYMPH:  no palpable lymphadenopathy in the cervical, axillary  ?LUNGS: clear to auscultation and percussion with normal breathing effort ?HEART: regular rate & rhythm and no murmurs and no lower extremity edema ?ABDOMEN:abdomen soft, non-tender and normal bowel sounds ?Musculoskeletal:no cyanosis of digits and no clubbing  ?PSYCH: alert & oriented x 3 with fluent speech ?NEURO: Walked independently to the clinic. ? ?LABORATORY DATA:  ?  I have reviewed the data as listed ?Lab Results  ?Component Value Date  ? WBC 4.2 10/19/2021  ? HGB 12.5 10/19/2021  ? HCT 39.8 10/19/2021  ? MCV 78.7 (L) 10/19/2021  ? PLT 253 10/19/2021  ? ?  Chemistry   ?   ?Component Value Date/Time  ? NA 137 10/19/2021 1250  ? K 3.9 10/19/2021 1250  ? CL 106 10/19/2021 1250  ? CO2 23 10/19/2021 1250  ? BUN <5 (L) 10/19/2021 1250  ? CREATININE 0.76 10/19/2021 1250  ?    ?Component Value Date/Time  ? CALCIUM 9.3 10/19/2021 1250  ? ALKPHOS 83 09/20/2021 0544  ? AST 53 (H) 09/20/2021 0544  ? ALT 80 (H) 09/20/2021 0544  ? BILITOT 0.3 09/20/2021 0544  ?  ? ? ?MRI Brain 09/10/21 ?1. Large area of cortical infarction in the right temporal, occipital, parietal, and posterior frontal lobes, with significant associated edema and redemonstrated intraparenchymal hemorrhage. The mass effect causes 11 mm of right-to-left midline shift, medial displacement of the right uncus and right temporal horn and effacement of the basal cisterns, unchanged from the prior CT and  raising concern for right uncal herniation. ?2. Additional focal area of restricted diffusion in the medial aspect of the right basal ganglia, suspected to be in the preoptic area of the hypothalamus,

## 2021-11-28 ENCOUNTER — Ambulatory Visit (HOSPITAL_COMMUNITY)
Admission: RE | Admit: 2021-11-28 | Discharge: 2021-11-28 | Disposition: A | Discharge status: Home / Self Care | Payer: 59 | Source: Ambulatory Visit | Attending: Neurology | Admitting: Neurology

## 2021-11-28 ENCOUNTER — Ambulatory Visit: Payer: 59 | Admitting: Occupational Therapy

## 2021-11-28 ENCOUNTER — Telehealth: Payer: Self-pay | Admitting: Hematology and Oncology

## 2021-11-28 ENCOUNTER — Ambulatory Visit (HOSPITAL_COMMUNITY): Payer: 59

## 2021-11-28 DIAGNOSIS — G08 Intracranial and intraspinal phlebitis and thrombophlebitis: Secondary | ICD-10-CM | POA: Insufficient documentation

## 2021-11-28 DIAGNOSIS — R41841 Cognitive communication deficit: Secondary | ICD-10-CM | POA: Diagnosis not present

## 2021-11-28 DIAGNOSIS — M25512 Pain in left shoulder: Secondary | ICD-10-CM

## 2021-11-28 DIAGNOSIS — M6281 Muscle weakness (generalized): Secondary | ICD-10-CM

## 2021-11-28 DIAGNOSIS — R2681 Unsteadiness on feet: Secondary | ICD-10-CM

## 2021-11-28 DIAGNOSIS — R41840 Attention and concentration deficit: Secondary | ICD-10-CM

## 2021-11-28 DIAGNOSIS — R278 Other lack of coordination: Secondary | ICD-10-CM

## 2021-11-28 DIAGNOSIS — R41844 Frontal lobe and executive function deficit: Secondary | ICD-10-CM

## 2021-11-28 MED ORDER — SODIUM CHLORIDE (PF) 0.9 % IJ SOLN
INTRAMUSCULAR | Status: AC
  Filled 2021-11-28: qty 50

## 2021-11-28 MED ORDER — IOHEXOL 350 MG/ML SOLN
100.0000 mL | Freq: Once | INTRAVENOUS | Status: AC | PRN
  Administered 2021-11-28: 100 mL via INTRAVENOUS

## 2021-11-28 NOTE — Therapy (Addendum)
? ?OUTPATIENT OCCUPATIONAL THERAPY NEURO Treatment ? ?Patient Name: April Reilly ?MRN: [id number] ?DOB:1970/11/18, 51 y.o., female ?Today's Date: 11/28/2021 ? ? ? ?OUTPATIENT OCCUPATIONAL THERAPY TREATMENT NOTE ? ? ?Patient Name: April Reilly ?MRN: [id number] ?DOB:Jul 19, 1971, 51 y.o., female ?Today's Date: 11/28/2021 ? ?PCP: Pcp, No ?REFERRING PROVIDER: Izora Ribas, MD ? ?END OF SESSION:  ? OT End of Session - 11/28/21 1009   ? ? Visit Number 6   ? Number of Visits 25   ? Date for OT Re-Evaluation 01/07/22   ? Authorization Type [facility]MR- [facility]   ? OT Start Time 0940   ? OT Stop Time 1015   ? OT Time Calculation (min) 35 min   ? ?  ?  ? ?  ? ? ? ? ?  ? ?  ? ? ? ? ?Past Medical History:  ?Diagnosis Date  ? Asthma   ? Vaginal bleeding, abnormal   ? for 4 months  ? ?Past Surgical History:  ?Procedure Laterality Date  ? FOOT SURGERY    ? TUBAL LIGATION    ? ?Patient Active Problem List  ? Diagnosis Date Noted  ? 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 Hyponatremia 09/26/2021  ? Abnormal LFTs 09/26/2021  ? Dysfunctional uterine bleeding 09/26/2021  ? Uterine fibroid 09/19/2021  ? 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 Essential hypertension 09/16/2021  ? Hyperlipidemia 09/16/2021  ? Seizure ([place]) 09/16/2021  ? Aspiration pneumonia ([place]) 09/16/2021  ? Sickle cell disease ([place]) 09/16/2021  ? Iron deficiency anemia 09/16/2021  ? Cerebral edema ([place]) 09/10/2021  ? Acute metabolic encephalopathy 67/20/9470  ? ICH (intracerebral hemorrhage) ([place]) 09/10/2021  ? Reactive airway disease without complication 96/28/3662  ? ? ?ONSET DATE: 09/10/21 ? ?REFERRING DIAG: ICH-R frontal, parietal, occipital and temporal hemorrhage ? ?THERAPY DIAG:  ?Muscle weakness (generalized) ? ?Unsteadiness on feet ? ?Other lack of coordination ? ?Frontal lobe and executive function deficit ? ?Attention and concentration deficit ? ?Acute pain of left shoulder ? ?SUBJECTIVE:  ? ?SUBJECTIVE STATEMENT: ?Pt reports left shoulder pain ?Pt  accompanied by: self ? ?PERTINENT HISTORY: 51 y.o. female admitted to [facility] on 09/10/2021 after fall out of bed followed by AMS. CT: significant edema in R frontal, parietal, occipital and temporal lobe with superimposed hemorrhage, . PMH includes sickle cell trait, anemia. Pt. d/c home on 09/26/21 following CIR.  ? ?PRECAUTIONS: Other: sickle cell , seizures, no driving, no work ? ?WEIGHT BEARING RESTRICTIONS No ? ?PAIN:  ?Are you having pain? Yes: NPRS scale: 6/10 ?Pain location: left shoulder, elbow ?Pain description: aching ?Aggravating factors: movement ?Relieving factors: Not moving it , heat ? ?FALLS: Has patient fallen in last 6 months? No,  ? ?LIVING ENVIRONMENT: ?Lives with: alone ?Lives in: House/apartment ?Stairs: Yes: External: 3 steps; none ?Has following equipment at home: Shower bench ? ?PLOF: Independent, head chef at [employer] ? ? ?PATIENT GOALS decrease shoulder pain ? ? ?Treatment:  ?Seated at table hotpack applied to left shoulder and elbow x 10 mins(no adverse reactions) while pt performed symbol matching on constant therapy for visual scanning and      attention, level 6, 93% accuracy. ?Pt performed table slides for gentle shoulder flexion and elbow extension, followed by chest press, and shoulder flexion reaching towards floor with min v.c. ?Pt reports cooking shrimp on stovetop with supervision from her dtr's. ? ? ? ?PATIENT EDUCATION: ?N/A ?  ?SHORT TERM GOALS: Target date: 12/26/2021 ? ?I with initial HEP ? ?Goal status: INITIAL ? ?2.  Pt will demonstrate ability  to retrieve a lightweight object at 90* with no more than min compensations and pain no greater than 3/10.Marland Kitchen ?Baseline: 85* ?Goal status: INITIAL ? ?3.  Pt will perform basic home management with supervision demonstrating good safety awareness. ?Baseline:dependent ?Goal status: INITIAL ? ?4.  Pt will perform basic cooking with no more than min v.c/ min A demonstrating good safety awareness. ?Baseline: dependent- pt was a chef ?Goal status:  INITIAL ? ?5.  Pt will perform a  a simple functional organization task such as generating a menu and grocery  list with no more than min v.c ? ?Goal status: INITIAL ? ?6.  Pt will increase LUE grip strength by 5 lbs for increased LUE functional use. ?Baseline: 44.7 lbs ?Goal status: INITIAL ? ? 7. Pt will perform all basic ADLS modified independently ? ?LONG TERM GOALS: Target date:01/07/22 ? ?I with updated HEP ? ?Goal status: INITIAL ? ?2.  Pt will perform mod complex home management mod I  ? ?Goal status: INITIAL ? ?3.  Pt will perform mod complex cooking modified independently demonstrating good safety awareness. ? ?Goal status: INITIAL ? ?4.  Pt will perform simulated work activities mod I ? ?Goal status: INITIAL ? ?5.  Pt will retrieve a light weight object at 110* shoulder flexion with pain no greater than 3/10. ? ?Goal status: INITIAL ?6. Pt will demonstrate ability to perform a physical and cognitive task simultaneously with 90%of better accuracy. ?Goal status: initial  ? ?8. Pt will perform tabletop and environmental scanning tasks with 90% or better accuracy. ?Goal status: Initial ? ?ASSESSMENT: ? ?CLINICAL IMPRESSION: ?Pt is progressing towards goals slowly. Pt is limited by left shoulder  and elbow pain. ?PRECAUTIONS: Other: sickle cell ? ?PERFORMANCE DEFICITS in functional skills including ADLs, IADLs, coordination, dexterity, ROM, strength, pain, flexibility, FMC, GMC, mobility, balance, decreased knowledge of precautions, decreased knowledge of use of DME, and UE functional use, cognitive skills including attention, energy/drive, learn, memory, problem solving, safety awareness, sequencing, thought, and understand, and psychosocial skills including coping strategies and routines and behaviors.  ? ?IMPAIRMENTS are limiting patient from ADLs, IADLs, rest and sleep, work, play, leisure, and social participation.  ? ?COMORBIDITIES may have co-morbidities  that affects occupational performance. Patient  will benefit from skilled OT to address above impairments and improve overall function. ? ?MODIFICATION OR ASSISTANCE TO COMPLETE EVALUATION: No modification of tasks or assist necessary to complete an evaluation. ? ?OT OCCUPATIONAL PROFILE AND HISTORY: Detailed assessment: Review of records and additional review of physical, cognitive, psychosocial history related to current functional performance. ? ?CLINICAL DECISION MAKING: LOW - limited treatment options, no task modification necessary ? ?REHAB POTENTIAL: Good ? ?EVALUATION COMPLEXITY: Moderate ? ? ? ?PLAN: ?OT FREQUENCY: 2x/week ? ?OT DURATION: 12 weeks plus eval- (POC written for 12 weeks to account for scheduling, anticipate d/c after 8 weeks dependent on progress.) ? ?PLANNED INTERVENTIONS: self care/ADL training, therapeutic exercise, therapeutic activity, neuromuscular re-education, manual therapy, passive range of motion, balance training, functional mobility training, aquatic therapy, electrical stimulation, ultrasound, paraffin, fluidotherapy, moist heat, cryotherapy, patient/family education, cognitive remediation/compensation, visual/perceptual remediation/compensation, energy conservation, coping strategies training, and DME and/or AE instructions ? ?RECOMMENDED OTHER SERVICES: PT, ST ? ?CONSULTED AND AGREED WITH PLAN OF CARE: Patient ? ?PLAN FOR NEXT SESSION: monitor pain, review/ progress  HEP, simple cognitive tasks with a visual component, consider simple cooking task ? ?Tywone Bembenek, OT ?11/28/2021, 9:44 AM ?Theone Murdoch, OTR/L ?[phone] ?Phone: [phone] ?9:44 AM 11/28/21  ? ? ? ? ? ? ?   ?

## 2021-11-28 NOTE — Telephone Encounter (Signed)
Scheduled appointment per 05/09 los. Patient aware.  ?

## 2021-11-29 ENCOUNTER — Encounter: Payer: 59 | Admitting: Occupational Therapy

## 2021-11-29 ENCOUNTER — Ambulatory Visit: Payer: 59

## 2021-11-30 ENCOUNTER — Other Ambulatory Visit (HOSPITAL_COMMUNITY): Payer: Self-pay

## 2021-12-03 ENCOUNTER — Encounter: Payer: Self-pay | Admitting: *Deleted

## 2021-12-03 ENCOUNTER — Telehealth: Payer: Self-pay | Admitting: *Deleted

## 2021-12-03 NOTE — Telephone Encounter (Signed)
Attempted to call patient (vm not set up. Attempted to contact dgt on DPR (vm full). ? ?Sent mychart message: ? ?Hi April Reilly, ?We attempted to reach you by phone. Your voicemail box was not set up so we were unable to leave a message. We are calling about FMLA paperwork our office received. We will need to speak to you before it can be completed. You may reach [place] at [phone]. ?Sincerely, ?Sharyn Lull ?___________________________________ ?We need to gather further information about what is needed. Also, there is a ppw fee our office charges.  ? ?1) What is her occupation with [employer]? ?2) Has she been out for work since her hospital discharge? ? ?If she has been out of work, then Dr. April Manson is willing to keep her out until her next appt on 12/24/21. At that time, he will re-evaluate the patient and determine needs. If she requires updated FMLA after that visit, she would not have to pay another fee that soon (since it would be more of an adjustment to the plan).  ? ?

## 2021-12-03 NOTE — Telephone Encounter (Addendum)
I spoke to the patient. 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 If someone is out, then she fills in and cooks. She has been out of work since her hospitalization. Paperwork will be completed and returned to [place] records. She was transferred to [facility] for discuss payment.  ?

## 2021-12-04 ENCOUNTER — Ambulatory Visit: Payer: 59 | Admitting: Occupational Therapy

## 2021-12-04 DIAGNOSIS — R41844 Frontal lobe and executive function deficit: Secondary | ICD-10-CM

## 2021-12-04 DIAGNOSIS — R2681 Unsteadiness on feet: Secondary | ICD-10-CM

## 2021-12-04 DIAGNOSIS — R41840 Attention and concentration deficit: Secondary | ICD-10-CM

## 2021-12-04 DIAGNOSIS — M6281 Muscle weakness (generalized): Secondary | ICD-10-CM

## 2021-12-04 DIAGNOSIS — R278 Other lack of coordination: Secondary | ICD-10-CM

## 2021-12-04 DIAGNOSIS — R41841 Cognitive communication deficit: Secondary | ICD-10-CM | POA: Diagnosis not present

## 2021-12-04 DIAGNOSIS — M25512 Pain in left shoulder: Secondary | ICD-10-CM

## 2021-12-04 NOTE — Therapy (Signed)
? ?OUTPATIENT OCCUPATIONAL THERAPY NEURO Treatment ? ?Patient Name: April Reilly ?MRN: [id number] ?DOB:Jul 22, 1971, 51 y.o., female ?Today's Date: 12/04/2021 ? ? ? OT End of Session - 12/04/21 1055   ? ? Visit Number 7   ? Number of Visits 25   ? Date for OT Re-Evaluation 01/07/22   ? Authorization Type [facility]MR- [facility]   ? OT Start Time 1017   ? OT Stop Time 1058   ? OT Time Calculation (min) 41 min   ? ?  ?  ? ?  ? ?OUTPATIENT OCCUPATIONAL THERAPY TREATMENT NOTE ? ? ?Patient Name: April Reilly ?MRN: [id number] ?DOB:[birth year], 51 y.o., female ?Today's Date: 12/04/2021 ? ?PCP: Pcp, No ?REFERRING PROVIDER: Izora Ribas, MD ? ?END OF SESSION:  ? OT End of Session - 12/04/21 1055   ? ? Visit Number 7   ? Number of Visits 25   ? Date for OT Re-Evaluation 01/07/22   ? Authorization Type [facility]- [facility]   ? OT Start Time 1017   ? OT Stop Time 1058   ? OT Time Calculation (min) 41 min   ? ?  ?  ? ?  ? ? ? ? ? ?  ? ?  ? ? ? ? ?Past Medical History:  ?Diagnosis Date  ? Asthma   ? Vaginal bleeding, abnormal   ? for 4 months  ? ?Past Surgical History:  ?Procedure Laterality Date  ? FOOT SURGERY    ? TUBAL LIGATION    ? ?Patient Active Problem List  ? Diagnosis Date Noted  ? 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 Hyponatremia 09/26/2021  ? Abnormal LFTs 09/26/2021  ? Dysfunctional uterine bleeding 09/26/2021  ? Uterine fibroid 09/19/2021  ? 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 Essential hypertension 09/16/2021  ? Hyperlipidemia 09/16/2021  ? Seizure ([place]) 09/16/2021  ? Aspiration pneumonia ([place]) 09/16/2021  ? Sickle cell disease ([place]) 09/16/2021  ? Iron deficiency anemia 09/16/2021  ? Cerebral edema ([place]) 09/10/2021  ? Acute metabolic encephalopathy 43/32/9518  ? ICH (intracerebral hemorrhage) ([place]) 09/10/2021  ? Reactive airway disease without complication 84/16/6063  ? ? ?ONSET DATE: 09/10/21 ? ?REFERRING DIAG: ICH-R frontal, parietal, occipital and temporal hemorrhage ? ?THERAPY DIAG:  ?Muscle weakness  (generalized) ? ?Unsteadiness on feet ? ?Other lack of coordination ? ?Frontal lobe and executive function deficit ? ?Attention and concentration deficit ? ?Acute pain of left shoulder ? ?SUBJECTIVE:  ? ?SUBJECTIVE STATEMENT: ?Pt reports left shoulder pain ?Pt accompanied by: self ? ?PERTINENT HISTORY: 51 y.o. female admitted to [facility] on 09/10/2021 after fall out of bed followed by AMS. CT: significant edema in R frontal, parietal, occipital and temporal lobe with superimposed hemorrhage, . PMH includes sickle cell trait, anemia. Pt. d/c home on 09/26/21 following CIR.  ? ?PRECAUTIONS: Other: sickle cell , seizures, no driving, no work ? ?WEIGHT BEARING RESTRICTIONS No ? ?PAIN:  ?Are you having pain? Yes: NPRS scale: 6/10 ?Pain location: left right shoulder, ?Pain description: aching ?Aggravating factors: movement ?Relieving factors: Not moving it , heat ? ?FALLS: Has patient fallen in last 6 months? No,  ? ?LIVING ENVIRONMENT: ?Lives with: alone ?Lives in: House/apartment ?Stairs: Yes: External: 3 steps; none ?Has following equipment at home: Shower bench ? ?PLOF: Independent, head chef at [employer] ? ? ?PATIENT GOALS decrease shoulder pain ? ? ?Treatment:  ?Supine closed chain chest press, min v.c / facillitation ?Sidelying on left side for A/ROM elbow flexion/ extension, ?Seated closed chain shoulder flexion reach for the floor and chest press, min  v.c ?A/ROM  shoulder flexion and circumduction with LUE supported on cane, min facilitation/ v.c. Pt also performed gentle ROM with RUE as well with cane. ?Copying small peg design with LUE for increased fine motor coordination and visual perceptual skills, good performance. ? ?PATIENT EDUCATION: ?N/A ?  ?SHORT TERM GOALS: Target date: 01/01/2022 ? ?I with initial HEP ? ?Goal status: ongoing ? ?2.  Pt will demonstrate ability to retrieve a lightweight object at 90* with no more than min compensations and pain no greater than 3/10.Marland Kitchen ?Baseline: 85* ?Goal status: ongoing ? ?3.  Pt  will perform basic home management with supervision demonstrating good safety awareness. ?Baseline:dependent ?Goal status: ongoing ? ?4.  Pt will perform basic cooking with no more than min v.c/ min A demonstrating good safety awareness. 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 ?5.  Pt will perform a  a simple functional organization task such as generating a menu and grocery  list with no more than min v.c ? ?Goal status: ongoing ? ?6.  Pt will increase LUE grip strength by 5 lbs for increased LUE functional use. ?Baseline: 44.7 lbs ?Goal status: ongoing ? ? 7. Pt will perform all basic ADLS modified independently-ongoing,  supervision for shower ? ?LONG TERM GOALS: Target date:01/07/22 ? ?I with updated HEP ? ?Goal status: INITIAL ? ?2.  Pt will perform mod complex home management mod I  ? ?Goal status: INITIAL ? ?3.  Pt will perform mod complex cooking modified independently demonstrating good safety awareness. ? ?Goal status: INITIAL ? ?4.  Pt will perform simulated work activities mod I ? ?Goal status: INITIAL ? ?5.  Pt will retrieve a light weight object at 110* shoulder flexion with pain no greater than 3/10. ? ?Goal status: INITIAL ?6. Pt will demonstrate ability to perform a physical and cognitive task simultaneously with 90%of better accuracy. ?Goal status: initial  ? ?8. Pt will perform tabletop and environmental scanning tasks with 90% or better accuracy. ?Goal status: Initial ? ?ASSESSMENT: ? ?CLINICAL IMPRESSION: ?Pt is progressing towards goals slowly. Pt is limited by left shoulder  and elbow pain. ?PRECAUTIONS: Other: sickle cell ? ?PERFORMANCE DEFICITS in functional skills including ADLs, IADLs, coordination, dexterity, ROM, strength, pain, flexibility, FMC, GMC, mobility, balance, decreased knowledge of precautions, decreased knowledge of use of DME, and UE functional use, cognitive skills including attention, energy/drive, learn,  memory, problem solving, safety awareness, sequencing, thought, and understand, and psychosocial skills including coping strategies and routines and behaviors.  ? ?IMPAIRMENTS are limiting patient from ADLs, IADLs, rest and sleep, work, play, leisure, and social participation.  ? ?COMORBIDITIES may have co-morbidities  that affects occupational performance. Patient will benefit from skilled OT to address above impairments and improve overall function. ? ?MODIFICATION OR ASSISTANCE TO COMPLETE EVALUATION: No modification of tasks or assist necessary to complete an evaluation. ? ?OT OCCUPATIONAL PROFILE AND HISTORY: Detailed assessment: Review of records and additional review of physical, cognitive, psychosocial history related to current functional performance. ? ?CLINICAL DECISION MAKING: LOW - limited treatment options, no task modification necessary ? ?REHAB POTENTIAL: Good ? ?EVALUATION COMPLEXITY: Moderate ? ? ? ?PLAN: ?OT FREQUENCY: 2x/week ? ?OT DURATION: 12 weeks plus eval- (POC written for 12 weeks to account for scheduling, anticipate d/c after 8 weeks dependent on progress.) ? ?PLANNED INTERVENTIONS: self care/ADL training, therapeutic exercise, therapeutic activity, neuromuscular re-education, manual therapy, passive range of motion, balance training, functional mobility training, aquatic therapy, electrical stimulation, ultrasound,  paraffin, fluidotherapy, moist heat, cryotherapy, patient/family education, cognitive remediation/compensation, visual/perceptual remediation/compensation, energy conservation, coping strategies training, and DME and/or AE instructions ? ?RECOMMENDED OTHER SERVICES: PT, ST ? ?CONSULTED AND AGREED WITH PLAN OF CARE: Patient ? ?PLAN FOR NEXT SESSION: make a grocery list, simple cooking task, monitor pain, progress  HEP,  ? ?Chyrel Taha, OT ?12/04/2021, 12:23 PM ?Theone Murdoch, OTR/L ?[phone] ?Phone: [phone] ?12:23 PM 12/04/21  ? ? ? ? ? ? ?   ?

## 2021-12-05 NOTE — Telephone Encounter (Signed)
Form fee waived by Dr Krista Blue, completed form faxed ?

## 2021-12-05 NOTE — Therapy (Signed)
Patient Name: April Reilly MRN: [id number] DOB:May 22, 1971, 51 y.o., female Today's Date: 12/06/2021  PCP: Merryl Hacker, No REFERRING PROVIDER: Izora Ribas, MD  END OF SESSION:   OT End of Session - 12/06/21 1046     Visit Number 8    Number of Visits 25    Date for OT Re-Evaluation 01/07/22    Authorization Type [facility]- [facility]    OT Start Time 1020    OT Stop Time 1100    OT Time Calculation (min) 40 min    Activity Tolerance Patient tolerated treatment well    Behavior During Therapy WFL for tasks assessed/performed                          Past Medical History:  Diagnosis Date   Asthma    Vaginal bleeding, abnormal    for 4 months   Past Surgical History:  Procedure Laterality Date   FOOT SURGERY     TUBAL LIGATION     Patient Active Problem List   Diagnosis Date Noted   Other complicated headache syndrome 09/26/2021   Hyponatremia 09/26/2021   Abnormal LFTs 09/26/2021   Dysfunctional uterine bleeding 09/26/2021   Uterine fibroid 09/19/2021   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   Essential hypertension 09/16/2021   Hyperlipidemia 09/16/2021   Seizure ([place]) 09/16/2021   Aspiration pneumonia ([place]) 09/16/2021   Sickle cell disease ([place]) 09/16/2021   Iron deficiency anemia 09/16/2021   Cerebral edema ([place]) 49/44/9675   Acute metabolic encephalopathy 91/63/8466   ICH (intracerebral hemorrhage) ([place]) 09/10/2021   Reactive airway disease without complication 59/93/5701    ONSET DATE: 09/10/21  REFERRING DIAG: ICH-R frontal, parietal, occipital and temporal hemorrhage  THERAPY DIAG:  No diagnosis found.  SUBJECTIVE:   SUBJECTIVE STATEMENT: Pt reports left shoulder pain Pt accompanied by: self  PERTINENT HISTORY: 51 y.o. female admitted to [facility] on [facility]/10/2021 after fall out of bed followed by AMS. CT: significant edema in R frontal, parietal, occipital and temporal lobe with superimposed hemorrhage, . PMH includes sickle cell trait,  anemia. Pt. d/c home on 09/26/21 following CIR.   PRECAUTIONS: Other: sickle cell , seizures, no driving, no work  WEIGHT BEARING RESTRICTIONS No  PAIN:  Are you having pain? Yes: NPRS scale: 6/10 Pain location: left right shoulder, Pain description: aching Aggravating factors: movement Relieving factors: Not moving it , heat  FALLS: Has patient fallen in last 6 months? No,   LIVING ENVIRONMENT: Lives with: alone Lives in: House/apartment Stairs: Yes: External: 3 steps; none Has following equipment at home: Shower bench  PLOF: Independent, head chef at [employer]   PATIENT GOALS decrease shoulder pain   Treatment: Basic cooking task to fry an egg, pt retrieved all items and cooked egg demonstrating good safety awareness with supervision. Pt was able to carry on a conversation while performing the task. Pt turned off the stove without prompting. Pt was instructed to cook only when her family is present. Alternating symbols task level 6 with 98% accuracy for visual scanning/attention while hotpack on left shoulder for pain relief x 8 mins no adverse reactions Standing/ seated table slides for gentle LUE ROM , min v.c - Pt was instructed to perform at home   PATIENT EDUCATION: Tableslides, pt returned demonstration following instruction.   SHORT TERM GOALS: Target date: 01/03/2022  I with initial HEP  Goal status: ongoing  2.  Pt will demonstrate ability to retrieve a lightweight object at 90* with  no more than min compensations and pain no greater than 3/10.Marland Kitchen Baseline: 85* Goal status: ongoing  3.  Pt will perform basic home management with supervision demonstrating good safety awareness. Baseline:dependent Goal status: met washing ,sweeping and mopping   4.  Pt will perform basic cooking with no more than min v.c/ min A demonstrating good safety awareness. Baseline: dependent- pt was a chef Goal status: achieved ,  5.  Pt will perform a  a simple functional organization  task such as generating a menu and grocery  list with no more than min v.c  Goal status: ongoing  6.  Pt will increase LUE grip strength by 5 lbs for increased LUE functional use. Baseline: 44.7 lbs Goal status: ongoing- less due to wrist pain 28.6   7. Pt will perform all basic ADLS modified independently-ongoing,  supervision for shower, mod I for remainder of ADLS  LONG TERM GOALS: Target date:01/07/22  I with updated HEP  Goal status: ongoing  2.  Pt will perform mod complex home management mod I   Goal status: ongoing  3.  Pt will perform mod complex cooking modified independently demonstrating good safety awareness.  Goal status: INITIAL  4.  Pt will perform simulated work activities mod I  Goal status: INITIAL  5.  Pt will retrieve a light weight object at 110* shoulder flexion with pain no greater than 3/10.  Goal status: INITIAL 6. Pt will demonstrate ability to perform a physical and cognitive task simultaneously with 90%of better accuracy. Goal status: initial   8. Pt will perform tabletop and environmental scanning tasks with 90% or better accuracy. Goal status: Initial  ASSESSMENT:  CLINICAL IMPRESSION: Pt is progressing towards goals slowly. Pt is limited by left shoulder  and elbow pain. PRECAUTIONS: Other: sickle cell  PERFORMANCE DEFICITS in functional skills including ADLs, IADLs, coordination, dexterity, ROM, strength, pain, flexibility, FMC, GMC, mobility, balance, decreased knowledge of precautions, decreased knowledge of use of DME, and UE functional use, cognitive skills including attention, energy/drive, learn, memory, problem solving, safety awareness, sequencing, thought, and understand, and psychosocial skills including coping strategies and routines and behaviors.   IMPAIRMENTS are limiting patient from ADLs, IADLs, rest and sleep, work, play, leisure, and social participation.   COMORBIDITIES may have co-morbidities  that affects occupational  performance. Patient will benefit from skilled OT to address above impairments and improve overall function.  MODIFICATION OR ASSISTANCE TO COMPLETE EVALUATION: No modification of tasks or assist necessary to complete an evaluation.  OT OCCUPATIONAL PROFILE AND HISTORY: Detailed assessment: Review of records and additional review of physical, cognitive, psychosocial history related to current functional performance.  CLINICAL DECISION MAKING: LOW - limited treatment options, no task modification necessary  REHAB POTENTIAL: Good  EVALUATION COMPLEXITY: Moderate    PLAN: OT FREQUENCY: 2x/week  OT DURATION: 12 weeks plus eval- (POC written for 12 weeks to account for scheduling, anticipate d/c after 8 weeks dependent on progress.)  PLANNED INTERVENTIONS: self care/ADL training, therapeutic exercise, therapeutic activity, neuromuscular re-education, manual therapy, passive range of motion, balance training, functional mobility training, aquatic therapy, electrical stimulation, ultrasound, paraffin, fluidotherapy, moist heat, cryotherapy, patient/family education, cognitive remediation/compensation, visual/perceptual remediation/compensation, energy conservation, coping strategies training, and DME and/or AE instructions  RECOMMENDED OTHER SERVICES: PT, ST  CONSULTED AND AGREED WITH PLAN OF CARE: Patient  PLAN FOR NEXT SESSION: make a grocery list, simple cooking task, monitor pain, progress  HEP,   Idus Rathke, OT 12/06/2021, 10:47 AM Theone Murdoch, OTR/L [phone] Phone: [phone] 10:47  AM 12/06/21

## 2021-12-06 ENCOUNTER — Ambulatory Visit: Payer: 59 | Admitting: Occupational Therapy

## 2021-12-06 ENCOUNTER — Ambulatory Visit: Payer: 59

## 2021-12-06 DIAGNOSIS — M25512 Pain in left shoulder: Secondary | ICD-10-CM

## 2021-12-06 DIAGNOSIS — R278 Other lack of coordination: Secondary | ICD-10-CM

## 2021-12-06 DIAGNOSIS — R41844 Frontal lobe and executive function deficit: Secondary | ICD-10-CM

## 2021-12-06 DIAGNOSIS — R2681 Unsteadiness on feet: Secondary | ICD-10-CM

## 2021-12-06 DIAGNOSIS — M6281 Muscle weakness (generalized): Secondary | ICD-10-CM

## 2021-12-06 DIAGNOSIS — R41840 Attention and concentration deficit: Secondary | ICD-10-CM

## 2021-12-06 DIAGNOSIS — R41841 Cognitive communication deficit: Secondary | ICD-10-CM | POA: Diagnosis not present

## 2021-12-06 NOTE — Therapy (Signed)
OUTPATIENT SPEECH LANGUAGE PATHOLOGY TREATMENT NOTE (DISCHARGE)   Patient Name: April Reilly MRN: [id number] DOB:06-22-1971, 51 y.o., female Today's Date: 12/06/2021  PCP: Alric Ran, MD REFERRING PROVIDER: Alric Ran, MD  END OF SESSION:   End of Session - 12/06/21 1023     Visit Number 9    Number of Visits 17    Date for SLP Re-Evaluation 12/14/21    Authorization Type [facility]    Authorization Time Period needs auth after 25th ST visit    SLP Start Time 1100    SLP Stop Time  1145    SLP Time Calculation (min) 45 min    Activity Tolerance Patient tolerated treatment well             SPEECH THERAPY DISCHARGE SUMMARY  Visits from Start of Care: 9  Current functional level related to goals / functional outcomes: April Reilly has exhibited improvements in cognitive linguistic functioning related to awareness, attention, executive functioning, and recall. Pt has begun successfully completing iADLs with increased independence and good accuracy. Pt is pleased with current progress and agreeable to ST discharge today as ST goals have been met.    Remaining deficits: NA   Education / Equipment: Compensatory techniques, functional tasks  Patient agrees to discharge. Patient goals were met. Patient is being discharged due to meeting the stated rehab goals.  Past Medical History:  Diagnosis Date   Asthma    Vaginal bleeding, abnormal    for 4 months   Past Surgical History:  Procedure Laterality Date   FOOT SURGERY     TUBAL LIGATION     Patient Active Problem List   Diagnosis Date Noted   Other complicated headache syndrome 09/26/2021   Hyponatremia 09/26/2021   Abnormal LFTs 09/26/2021   Dysfunctional uterine bleeding 09/26/2021   Uterine fibroid 09/19/2021   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   Essential hypertension 09/16/2021   Hyperlipidemia 09/16/2021   Seizure ([place]) 09/16/2021   Aspiration pneumonia ([place]) 09/16/2021   Sickle cell disease  ([place]) 09/16/2021   Iron deficiency anemia 09/16/2021   Cerebral edema ([place]) 75/88/3254   Acute metabolic encephalopathy 98/26/4158   ICH (intracerebral hemorrhage) ([place]) 09/10/2021   Reactive airway disease without complication 30/94/0768    ONSET DATE: 09-10-21  REFERRING DIAG: R-CVA   THERAPY DIAG: Cognitive communication deficit  SUBJECTIVE: "I feel bad about missing last week"  PAIN:  Are you having pain? Yes  Pain Level: 5/10 Location: Left arm   OBJECTIVE:   TODAY'S TREATMENT:  12-06-21: Discussed recent medical events with seemingly good recall and attention to detail. Pt has exhibited improvements in cognitive linguistic functioning, including increased awareness and improved attention/recall/executive functioning. Current challenges described as pain management, stress, and clumsiness. Cognitive Function PROM re-administered today, with score of 157 (48 point improvement) indicating very minimal/rare cognitive challenges. Pt is pleased with current progress and agreeable to ST discharge this date as pt has met ST goals.   11-22-21: Pt arrived late and explained rationale for lateness (not related to recall). Pt continues to c/o increased pain and vision changes as well as increased nausea and moodiness. SLP recommended Magnifier app on phone to aid vision, which was successful. Reminder app on phone was successful to recall of functional tasks and return HEP. HEP was partially completed, which pt recognized today. Instructed patient to complete remainder of hwk. Pt has not consistently filled out pill organizer lately due to vision concerns and requested daughter assistance for ensure accuracy. SLP cued use of MyChart  to locate upcoming CT scan. Pt able to recall targeted task with extra processing time.    11-15-21: Some confusion and frustration reported re: mistaking therapy appointment times today. SLP re-directed patient to external aids to reduce confusion, in which pt able to  identify error with mod I. Pt inquired about energy conservation techniques with SLP providing education and recommendations to optimize cognitive and physical endurance as tolerated. SLP educated other external aids and compensatory techniques to aid recall as pt forgot HEP at home, such as reminders in phone, repetition, and mental picture strategies. SLP modeling and instruction was beneficial to aid understanding and carryover.   11-13-21: Pt returned to [place] after 1 week due to change in medical condition and medication changes following visit to [facility]. Seemingly good recall exhibited when compared to available medical documentation. Pt able to locate pertinent dates with use of external aids. Some cognitive fatigue indicated given extended duration. Pt continues with independent medication management with pt reporting good error awareness given recent medication changes. Pt now paying bills with no missed dates and good awareness of changes (increased usage related to increased bills). Education provided re: energy conservation and cognitive fatigue. Pt verbalized understanding.   10-31-21: No HEP completed last night due to pain following ST session yesterday. Pt returned with HEP per SLP recommendation. Targeted additional functional cognitive linguistic tasks (calculations and sequencing), in which pt able to complete with 88% with rare cues. Min to mod cues x2 required to aid comprehension and accuracy. Pt exhibited improving attention and awareness for detail oriented tasks. Good alternating attention exhibited between task and side conversation. HEP provided with recommendation to double check and reading directions twice.     PATIENT EDUCATION: Education details: see above Person educated: Patient Education method: Customer service manager Education comprehension: verbalized understanding, returned demonstration, and needs further education      GOALS: Goals reviewed with patient?  Yes   SHORT TERM GOALS: Target date: 11/16/2021   Pt will complete CLQT and PROM in first 1-2 ST sessions  Baseline:  Goal status: MET   2.  Pt will recall and implement 2 attention/memory compensations to aid daily functioning and completion of iADLs with occasional min A over 2 sessions Baseline: 10-24-21, 11-13-21 Goal status: MET   3.  Pt will use phone and/or calendar for orientation/time management given rare min A over 2 sessions  Baseline: 10-24-21, 10-30-21 Goal status: MET   4. Pt will complete functional iADL tasks (medication/financial management tasks) with 80% accuracy given occasional min A over 2 sessions  Baseline: 10-24-21, 11-13-21 Goal status: MET   5.  Pt will maintain sustained and focused attention for 15+ minutes for cognitive linguistic tasks given occasional min A over 2 sessions  Baseline: 10-24-21, 10-30-21 Goal status: MET     LONG TERM GOALS: Target date: 12/14/2021   Pt will recall and implement 4 attention/memory compensations to aid daily functioning and completion of iADLs with rare min A over 2 sessions Baseline: 11-15-21, 11-22-21 Goal status: MET   2.  Pt will complete functional iADL tasks (medication/financial management tasks) at home given rare min A over 2 sessions  Baseline:  Goal status: MET   3.  Pt will maintain sustained and focused attention for 30+ minutes for cognitive linguistic tasks given rare min A over 2 sessions  Baseline: 11-15-21, 12-06-21 Goal status: MET   4.  Pt will report improved cognitive linguistic functioning via PROM by 2 points at last ST session  Baseline:  CF=109; CF=157 Goal status: MET   ASSESSMENT:   CLINICAL IMPRESSION: Patient is a 51 y.o. 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 Completed education and training of compensatory techniques, external aids, and cognitive linguistic tasks to aid recall/attention and completion of functional household tasks to optimize  independence. Good carryover and implementation of recommended techniques reported for increased functional independence and safety. Pt met all ST goals and discharged today.    OBJECTIVE IMPAIRMENTS include attention, memory, awareness, and executive functioning. These impairments are limiting patient from managing medications, managing appointments, household responsibilities, and ADLs/IADLs. Factors affecting potential to achieve goals and functional outcome are family/community support. Patient will benefit from skilled SLP services to address above impairments and improve overall function.   REHAB POTENTIAL: Good   PLAN: SLP FREQUENCY: 2x/week   SLP DURATION: 8 weeks   PLANNED INTERVENTIONS: Cognitive reorganization, Internal/external aids, Functional tasks, SLP instruction and feedback, Compensatory strategies, and Patient/family education   Marzetta Board, CCC-SLP 12/06/2021, 10:24 AM

## 2021-12-11 ENCOUNTER — Ambulatory Visit: Payer: 59 | Admitting: Occupational Therapy

## 2021-12-11 DIAGNOSIS — R2681 Unsteadiness on feet: Secondary | ICD-10-CM

## 2021-12-11 DIAGNOSIS — R278 Other lack of coordination: Secondary | ICD-10-CM

## 2021-12-11 DIAGNOSIS — M25512 Pain in left shoulder: Secondary | ICD-10-CM

## 2021-12-11 DIAGNOSIS — R41840 Attention and concentration deficit: Secondary | ICD-10-CM

## 2021-12-11 DIAGNOSIS — M6281 Muscle weakness (generalized): Secondary | ICD-10-CM

## 2021-12-11 DIAGNOSIS — R41841 Cognitive communication deficit: Secondary | ICD-10-CM | POA: Diagnosis not present

## 2021-12-11 DIAGNOSIS — R41844 Frontal lobe and executive function deficit: Secondary | ICD-10-CM

## 2021-12-11 NOTE — Therapy (Signed)
Patient Name: April Reilly MRN: [id number] DOB:Dec 09, 1970, 51 y.o., female Today's Date: 12/11/2021  PCP: Merryl Hacker, No REFERRING PROVIDER: Izora Ribas, MD  END OF SESSION:   OT End of Session - 12/11/21 1213     Visit Number 9    Number of Visits 25    Date for OT Re-Evaluation 01/07/22    Authorization Type [facility]- [facility]    OT Start Time 1018    OT Stop Time 1100    OT Time Calculation (min) 42 min    Activity Tolerance Patient tolerated treatment well    Behavior During Therapy WFL for tasks assessed/performed                           Past Medical History:  Diagnosis Date   Asthma    Vaginal bleeding, abnormal    for 4 months   Past Surgical History:  Procedure Laterality Date   FOOT SURGERY     TUBAL LIGATION     Patient Active Problem List   Diagnosis Date Noted   Other complicated headache syndrome 09/26/2021   Hyponatremia 09/26/2021   Abnormal LFTs 09/26/2021   Dysfunctional uterine bleeding 09/26/2021   Uterine fibroid 09/19/2021   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   Essential hypertension 09/16/2021   Hyperlipidemia 09/16/2021   Seizure ([place]) 09/16/2021   Aspiration pneumonia ([place]) 09/16/2021   Sickle cell disease ([place]) 09/16/2021   Iron deficiency anemia 09/16/2021   Cerebral edema ([place]) 91/47/8295   Acute metabolic encephalopathy 62/13/0865   ICH (intracerebral hemorrhage) ([place]) 09/10/2021   Reactive airway disease without complication 78/46/9629    ONSET DATE: 09/10/21  REFERRING DIAG: ICH-R frontal, parietal, occipital and temporal hemorrhage  THERAPY DIAG:  Muscle weakness (generalized)  Unsteadiness on feet  Other lack of coordination  Frontal lobe and executive function deficit  Attention and concentration deficit  Acute pain of left shoulder  SUBJECTIVE:   SUBJECTIVE STATEMENT: Pt reports left shoulder pain Pt accompanied by: self  PERTINENT HISTORY: 51 y.o. female admitted to [facility] on 09/10/2021  after fall out of bed followed by AMS. CT: significant edema in R frontal, parietal, occipital and temporal lobe with superimposed hemorrhage, . PMH includes sickle cell trait, anemia. Pt. d/c home on 09/26/21 following CIR.   PRECAUTIONS: Other: sickle cell , seizures, no driving, no work  WEIGHT BEARING RESTRICTIONS No  PAIN:  Are you having pain? Yes: NPRS scale: 6/10 Pain location: left right shoulder, Pain description: aching Aggravating factors: movement Relieving factors: Not moving it , heat  FALLS: Has patient fallen in last 6 months? No,   LIVING ENVIRONMENT: Lives with: alone Lives in: House/apartment Stairs: Yes: External: 3 steps; none Has following equipment at home: Shower bench  PLOF: Independent, head chef at [employer]   PATIENT GOALS decrease shoulder pain   Treatment: Pt arrived today stating she may be having a sickle cell crisis.  Hotpack applied to neck and left shoulder x 15 mins no adverse reactions, while pt performed visual scanning activity alternating symbols on constant therapy with good accuracy. Therapist recommends that pt contacts her local hematologist regarding possible sickle cell crisis, pt was provided with hematologist contact info and her PCP contact information. Completing a 12 piece puzzle for increased scanning and problem solving, pt completed with increased time and 1 v.c Pt wrote a menu and generated a grocery list without difficulty today.      SHORT TERM GOALS: Target date: 01/08/2022  I with initial HEP  Goal status: ongoing, issued needs reinforcement  2.  Pt will demonstrate ability to retrieve a lightweight object at 90* with no more than min compensations and pain no greater than 3/10.Marland Kitchen Baseline: 85* Goal status: ongoing  3.  Pt will perform basic home management with supervision demonstrating good safety awareness. Baseline:dependent Goal status: met washing ,sweeping and mopping   4.  Pt will perform basic cooking with  no more than min v.c/ min A demonstrating good safety awareness. 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  Pt will perform a  a simple functional organization task such as generating a menu and grocery  list with no more than min v.c  Goal status: Goal met  6.  Pt will increase LUE grip strength by 5 lbs for increased LUE functional use. Baseline: 44.7 lbs Goal status: ongoing- less due to wrist pain 28.6   7. Pt will perform all basic ADLS modified independently-ongoing,  supervision for shower, mod I for remainder of ADLS  LONG TERM GOALS: Target date:01/07/22  I with updated HEP  Goal status: ongoing  2.  Pt will perform mod complex home management mod I   Goal status: ongoing  3.  Pt will perform mod complex cooking modified independently demonstrating good safety awareness.  Goal status:  ongoing  4.  Pt will perform simulated work activities mod I  Goal status:  ongoing  5.  Pt will retrieve a light weight object at 110* shoulder flexion with pain no greater than 3/10.  Goal status:  ongoing 6. Pt will demonstrate ability to perform a physical and cognitive task simultaneously with 90%of better accuracy. Goal status: initial   8. Pt will perform tabletop and environmental scanning tasks with 90% or better accuracy. Goal status:  ongoing  ASSESSMENT:  CLINICAL IMPRESSION: Pt is progressing towards goals slowly. Pt is limited by generalized pain today. Pt has been encouraged to contact her hematologist regarding possible sickle cell crisis. PRECAUTIONS: Other: sickle cell  PERFORMANCE DEFICITS in functional skills including ADLs, IADLs, coordination, dexterity, ROM, strength, pain, flexibility, FMC, GMC, mobility, balance, decreased knowledge of precautions, decreased knowledge of use of DME, and UE functional use, cognitive skills including attention, energy/drive, learn, memory, problem solving, safety awareness, sequencing, thought, and  understand, and psychosocial skills including coping strategies and routines and behaviors.   IMPAIRMENTS are limiting patient from ADLs, IADLs, rest and sleep, work, play, leisure, and social participation.   COMORBIDITIES may have co-morbidities  that affects occupational performance. Patient will benefit from skilled OT to address above impairments and improve overall function.  MODIFICATION OR ASSISTANCE TO COMPLETE EVALUATION: No modification of tasks or assist necessary to complete an evaluation.  OT OCCUPATIONAL PROFILE AND HISTORY: Detailed assessment: Review of records and additional review of physical, cognitive, psychosocial history related to current functional performance.  CLINICAL DECISION MAKING: LOW - limited treatment options, no task modification necessary  REHAB POTENTIAL: Good  EVALUATION COMPLEXITY: Moderate    PLAN: OT FREQUENCY: 2x/week  OT DURATION: 12 weeks plus eval- (POC written for 12 weeks to account for scheduling, anticipate d/c after 8 weeks dependent on progress.)  PLANNED INTERVENTIONS: self care/ADL training, therapeutic exercise, therapeutic activity, neuromuscular re-education, manual therapy, passive range of motion, balance training, functional mobility training, aquatic therapy, electrical stimulation, ultrasound, paraffin, fluidotherapy, moist heat, cryotherapy, patient/family education, cognitive remediation/compensation, visual/perceptual remediation/compensation, energy conservation, coping strategies training, and DME and/or AE instructions  RECOMMENDED OTHER SERVICES: PT, ST  CONSULTED AND AGREED WITH PLAN OF CARE: Patient  PLAN FOR NEXT SESSION: , monitor pain, progress  HEP, environmental scanning  Raini Tiley, OT 12/11/2021, 12:24 PM Theone Murdoch, OTR/L [phone] Phone: [phone] 12:24 PM 12/11/21

## 2021-12-12 ENCOUNTER — Encounter: Payer: Self-pay | Admitting: Occupational Therapy

## 2021-12-12 ENCOUNTER — Ambulatory Visit: Payer: 59 | Admitting: Occupational Therapy

## 2021-12-12 DIAGNOSIS — M6281 Muscle weakness (generalized): Secondary | ICD-10-CM

## 2021-12-12 DIAGNOSIS — R278 Other lack of coordination: Secondary | ICD-10-CM

## 2021-12-12 DIAGNOSIS — R41840 Attention and concentration deficit: Secondary | ICD-10-CM

## 2021-12-12 DIAGNOSIS — R41844 Frontal lobe and executive function deficit: Secondary | ICD-10-CM

## 2021-12-12 DIAGNOSIS — R41841 Cognitive communication deficit: Secondary | ICD-10-CM | POA: Diagnosis not present

## 2021-12-12 DIAGNOSIS — M25512 Pain in left shoulder: Secondary | ICD-10-CM

## 2021-12-12 NOTE — Therapy (Signed)
Patient Name: April Reilly MRN: [id number] DOB:1970/12/11, 51 y.o., female Today's Date: 12/12/2021  PCP: Merryl Hacker, No REFERRING PROVIDER: Izora Ribas, MD  END OF SESSION:   OT End of Session - 12/12/21 1027     Visit Number 10    Number of Visits 25    Date for OT Re-Evaluation 01/07/22    Authorization Type [employer]    OT Start Time 1018    OT Stop Time 1100    OT Time Calculation (min) 42 min    Activity Tolerance Patient tolerated treatment well    Behavior During Therapy WFL for tasks assessed/performed                           Past Medical History:  Diagnosis Date   Asthma    Vaginal bleeding, abnormal    for 4 months   Past Surgical History:  Procedure Laterality Date   FOOT SURGERY     TUBAL LIGATION     Patient Active Problem List   Diagnosis Date Noted   Other complicated headache syndrome 09/26/2021   Hyponatremia 09/26/2021   Abnormal LFTs 09/26/2021   Dysfunctional uterine bleeding 09/26/2021   Uterine fibroid 09/19/2021   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   Essential hypertension 09/16/2021   Hyperlipidemia 09/16/2021   Seizure ([place]) 09/16/2021   Aspiration pneumonia ([place]) 09/16/2021   Sickle cell disease ([place]) 09/16/2021   Iron deficiency anemia 09/16/2021   Cerebral edema ([place]) 86/82/5749   Acute metabolic encephalopathy 35/52/1747   ICH (intracerebral hemorrhage) ([place]) 09/10/2021   Reactive airway disease without complication 15/95/3967    ONSET DATE: 09/10/21  REFERRING DIAG: ICH-R frontal, parietal, occipital and temporal hemorrhage  THERAPY DIAG:  Muscle weakness (generalized)  Other lack of coordination  Frontal lobe and executive function deficit  Attention and concentration deficit  Acute pain of left shoulder  SUBJECTIVE:   SUBJECTIVE STATEMENT: Pt reports left shoulder pain Pt accompanied by: self  PERTINENT HISTORY: 51 y.o. female admitted to [facility] on 09/10/2021 after fall out of bed  followed by AMS. CT: significant edema in R frontal, parietal, occipital and temporal lobe with superimposed hemorrhage, . PMH includes sickle cell trait, anemia. Pt. d/c home on 09/26/21 following CIR.   PRECAUTIONS: Other: sickle cell , seizures, no driving, no work  WEIGHT BEARING RESTRICTIONS No  PAIN:  Are you having pain? Yes: NPRS scale: 6/10 Pain location: left right shoulder, Pain description: aching Aggravating factors: movement Relieving factors: Not moving it , heat  FALLS: Has patient fallen in last 6 months? No,   LIVING ENVIRONMENT: Lives with: alone Lives in: House/apartment Stairs: Yes: External: 3 steps; none Has following equipment at home: Shower bench  PLOF: Independent, head chef at [employer]   PATIENT GOALS decrease shoulder pain   Treatment: Pt arrived today stating she had a telehealth visit with her MD last night. He encouraged her to drink more fluids and to stay warmer. Pt reports she is feeling better today. Hotpack applied to neck and left shoulder x 10 mins no adverse reactions, while pt  initiated A/ROM, AA/ROM Closed chain chest press, elbow flexion/ extension and shoulder flexion, then scapular retraction and shoulder depression, min-mod facilitation/ v.c  No adverse reactions to heat  Seated low range shoulder flexion and abduction with UE ranger for AA/ROM, min v.c for shoulder positioning A/ROM forearm supination/ pronation and wrist flexion/ extension,  Functional grasp/ release of graded clothespins low range red  and yellow for sustained pinch, min difficulty/ v.c      SHORT TERM GOALS: Target date: 01/09/2022  I with initial HEP  Goal status: ongoing, issued needs reinforcement  2.  Pt will demonstrate ability to retrieve a lightweight object at 90* with no more than min compensations and pain no greater than 3/10.Marland Kitchen Baseline: 85* Goal status: ongoing  3.  Pt will perform basic home management with supervision demonstrating good safety  awareness. Baseline:dependent Goal status: met washing ,sweeping and mopping   4.  Pt will perform basic cooking with no more than min v.c/ min A demonstrating good safety awareness. 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  Pt will perform a  a simple functional organization task such as generating a menu and grocery  list with no more than min v.c  Goal status: Goal met  6.  Pt will increase LUE grip strength by 5 lbs for increased LUE functional use. Baseline: 44.7 lbs Goal status: ongoing- less due to wrist pain 28.6   7. Pt will perform all basic ADLS modified independently-ongoing,  supervision for shower, mod I for remainder of ADLS  LONG TERM GOALS: Target date:01/07/22  I with updated HEP  Goal status: ongoing  2.  Pt will perform mod complex home management mod I   Goal status: ongoing  3.  Pt will perform mod complex cooking modified independently demonstrating good safety awareness.  Goal status:  ongoing  4.  Pt will perform simulated work activities mod I  Goal status:  ongoing  5.  Pt will retrieve a light weight object at 110* shoulder flexion with pain no greater than 3/10.  Goal status:  ongoing 6. Pt will demonstrate ability to perform a physical and cognitive task simultaneously with 90%of better accuracy. Goal status: initial   8. Pt will perform tabletop and environmental scanning tasks with 90% or better accuracy. Goal status:  ongoing  ASSESSMENT:  CLINICAL IMPRESSION: Pt is progressing towards goals slowly. Pt reports her pain is better today since she has been drinking more water. PRECAUTIONS: Other: sickle cell  PERFORMANCE DEFICITS in functional skills including ADLs, IADLs, coordination, dexterity, ROM, strength, pain, flexibility, FMC, GMC, mobility, balance, decreased knowledge of precautions, decreased knowledge of use of DME, and UE functional use, cognitive skills including attention, energy/drive, learn, memory,  problem solving, safety awareness, sequencing, thought, and understand, and psychosocial skills including coping strategies and routines and behaviors.   IMPAIRMENTS are limiting patient from ADLs, IADLs, rest and sleep, work, play, leisure, and social participation.   COMORBIDITIES may have co-morbidities  that affects occupational performance. Patient will benefit from skilled OT to address above impairments and improve overall function.  MODIFICATION OR ASSISTANCE TO COMPLETE EVALUATION: No modification of tasks or assist necessary to complete an evaluation.  OT OCCUPATIONAL PROFILE AND HISTORY: Detailed assessment: Review of records and additional review of physical, cognitive, psychosocial history related to current functional performance.  CLINICAL DECISION MAKING: LOW - limited treatment options, no task modification necessary  REHAB POTENTIAL: Good  EVALUATION COMPLEXITY: Moderate    PLAN: OT FREQUENCY: 2x/week  OT DURATION: 12 weeks plus eval- (POC written for 12 weeks to account for scheduling, anticipate d/c after 8 weeks dependent on progress.)  PLANNED INTERVENTIONS: self care/ADL training, therapeutic exercise, therapeutic activity, neuromuscular re-education, manual therapy, passive range of motion, balance training, functional mobility training, aquatic therapy, electrical stimulation, ultrasound, paraffin, fluidotherapy, moist heat, cryotherapy, patient/family education, cognitive remediation/compensation, visual/perceptual remediation/compensation, energy conservation, coping  strategies training, and DME and/or AE instructions  RECOMMENDED OTHER SERVICES: PT, ST  CONSULTED AND AGREED WITH PLAN OF CARE: Patient  PLAN FOR NEXT SESSION: , monitor pain, progress  HEP, environmental scanning  Otha Rickles, OT 12/12/2021, 10:29 AM Theone Murdoch, OTR/L [phone] Phone: [phone] 10:29 AM 12/12/21

## 2021-12-13 ENCOUNTER — Encounter: Payer: 59 | Admitting: Occupational Therapy

## 2021-12-19 ENCOUNTER — Ambulatory Visit: Payer: 59 | Admitting: Occupational Therapy

## 2021-12-19 DIAGNOSIS — M25512 Pain in left shoulder: Secondary | ICD-10-CM

## 2021-12-19 DIAGNOSIS — R41844 Frontal lobe and executive function deficit: Secondary | ICD-10-CM

## 2021-12-19 DIAGNOSIS — R2681 Unsteadiness on feet: Secondary | ICD-10-CM

## 2021-12-19 DIAGNOSIS — R278 Other lack of coordination: Secondary | ICD-10-CM

## 2021-12-19 DIAGNOSIS — R41841 Cognitive communication deficit: Secondary | ICD-10-CM | POA: Diagnosis not present

## 2021-12-19 DIAGNOSIS — M6281 Muscle weakness (generalized): Secondary | ICD-10-CM

## 2021-12-19 DIAGNOSIS — R41840 Attention and concentration deficit: Secondary | ICD-10-CM

## 2021-12-19 NOTE — Therapy (Unsigned)
Patient Name: April Reilly MRN: [id number] DOB:1971/06/26, 51 y.o., female Today's Date: 12/12/2021  PCP: Merryl Hacker, No REFERRING PROVIDER: Izora Ribas, MD  END OF SESSION:   OT End of Session - 12/12/21 1027     Visit Number 10    Number of Visits 25    Date for OT Re-Evaluation 01/07/22    Authorization Type [facility]    OT Start Time 1018    OT Stop Time 1100    OT Time Calculation (min) 42 min    Activity Tolerance Patient tolerated treatment well    Behavior During Therapy WFL for tasks assessed/performed                           Past Medical History:  Diagnosis Date   Asthma    Vaginal bleeding, abnormal    for 4 months   Past Surgical History:  Procedure Laterality Date   FOOT SURGERY     TUBAL LIGATION     Patient Active Problem List   Diagnosis Date Noted   Other complicated headache syndrome 09/26/2021   Hyponatremia 09/26/2021   Abnormal LFTs 09/26/2021   Dysfunctional uterine bleeding 09/26/2021   Uterine fibroid 09/19/2021   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   Essential hypertension 09/16/2021   Hyperlipidemia 09/16/2021   Seizure ([place]) 09/16/2021   Aspiration pneumonia ([place]) 09/16/2021   Sickle cell disease ([place]) 09/16/2021   Iron deficiency anemia 09/16/2021   Cerebral edema ([place]) 70/17/7939   Acute metabolic encephalopathy 03/00/9233   ICH (intracerebral hemorrhage) ([place]) 09/10/2021   Reactive airway disease without complication 00/76/2263    ONSET DATE: 09/10/21  REFERRING DIAG: ICH-R frontal, parietal, occipital and temporal hemorrhage  THERAPY DIAG:  Muscle weakness (generalized)  Other lack of coordination  Frontal lobe and executive function deficit  Attention and concentration deficit  Acute pain of left shoulder  SUBJECTIVE:   SUBJECTIVE STATEMENT: Pt reports left shoulder pain Pt accompanied by: self  PERTINENT HISTORY: 51 y.o. female admitted to [facility] on 09/10/2021 after fall out of bed  followed by AMS. CT: significant edema in R frontal, parietal, occipital and temporal lobe with superimposed hemorrhage, . PMH includes sickle cell trait, anemia. Pt. d/c home on 09/26/21 following CIR.   PRECAUTIONS: Other: sickle cell, seizures, no driving, no work  WEIGHT BEARING RESTRICTIONS No  PAIN:  Are you having pain? Yes: NPRS scale: 6/10 Pain location: left right shoulder, Pain description: aching Aggravating factors: movement Relieving factors: Not moving it , heat  FALLS: Has patient fallen in last 6 months? No,   LIVING ENVIRONMENT: Lives with: alone Lives in: House/apartment Stairs: Yes: External: 3 steps; none Has following equipment at home: Shower bench  PLOF: Independent, head chef at [employer]   PATIENT GOALS decrease shoulder pain   Treatment: Pt arrived today stating she had a telehealth visit with her MD last night. He encouraged her to drink more fluids and to stay warmer. Pt reports she is feeling better today. Hotpack applied to neck and left shoulder x 10 mins no adverse reactions, while pt  initiated A/ROM, AA/ROM Closed chain chest press, elbow flexion/ extension and shoulder flexion, then scapular retraction and shoulder depression, min-mod facilitation/ v.c  No adverse reactions to heat  Seated low range shoulder flexion and abduction with UE ranger for AA/ROM, min v.c for shoulder positioning A/ROM forearm supination/ pronation and wrist flexion/ extension,  Functional grasp/ release of graded clothespins low range red and  yellow for sustained pinch, min difficulty/ v.c      SHORT TERM GOALS: Target date: 01/09/2022  I with initial HEP  Goal status: ongoing, issued needs reinforcement  2.  Pt will demonstrate ability to retrieve a lightweight object at 90* with no more than min compensations and pain no greater than 3/10.Marland Kitchen Baseline: 85* Goal status: ongoing  3.  Pt will perform basic home management with supervision demonstrating good safety  awareness. Baseline:dependent Goal status: met washing ,sweeping and mopping   4.  Pt will perform basic cooking with no more than min v.c/ min A demonstrating good safety awareness. 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  Pt will perform a  a simple functional organization task such as generating a menu and grocery  list with no more than min v.c  Goal status: Goal met  6.  Pt will increase LUE grip strength by 5 lbs for increased LUE functional use. Baseline: 44.7 lbs Goal status: ongoing- less due to wrist pain 28.6   7. Pt will perform all basic ADLS modified independently-ongoing,  supervision for shower, mod I for remainder of ADLS  LONG TERM GOALS: Target date:01/07/22  I with updated HEP  Goal status: ongoing  2.  Pt will perform mod complex home management mod I   Goal status: ongoing  3.  Pt will perform mod complex cooking modified independently demonstrating good safety awareness.  Goal status:  ongoing  4.  Pt will perform simulated work activities mod I  Goal status:  deferred   5.  Pt will retrieve a light weight object at 110* shoulder flexion with pain no greater than 3/10.  Goal status:  ongoing 6. Pt will demonstrate ability to perform a physical and cognitive task simultaneously with 90%of better accuracy. Goal status: initial   8. Pt will perform tabletop and environmental scanning tasks with 90% or better accuracy. Goal status:  ongoing  ASSESSMENT:  CLINICAL IMPRESSION: Pt is progressing towards goals slowly. Pt reports her pain is better today since she has been drinking more water. PRECAUTIONS: Other: sickle cell  PERFORMANCE DEFICITS in functional skills including ADLs, IADLs, coordination, dexterity, ROM, strength, pain, flexibility, FMC, GMC, mobility, balance, decreased knowledge of precautions, decreased knowledge of use of DME, and UE functional use, cognitive skills including attention, energy/drive, learn,  memory, problem solving, safety awareness, sequencing, thought, and understand, and psychosocial skills including coping strategies and routines and behaviors.   IMPAIRMENTS are limiting patient from ADLs, IADLs, rest and sleep, work, play, leisure, and social participation.   COMORBIDITIES may have co-morbidities  that affects occupational performance. Patient will benefit from skilled OT to address above impairments and improve overall function.  MODIFICATION OR ASSISTANCE TO COMPLETE EVALUATION: No modification of tasks or assist necessary to complete an evaluation.  OT OCCUPATIONAL PROFILE AND HISTORY: Detailed assessment: Review of records and additional review of physical, cognitive, psychosocial history related to current functional performance.  CLINICAL DECISION MAKING: LOW - limited treatment options, no task modification necessary  REHAB POTENTIAL: Good  EVALUATION COMPLEXITY: Moderate    PLAN: OT FREQUENCY: 2x/week  OT DURATION: 12 weeks plus eval- (POC written for 12 weeks to account for scheduling, anticipate d/c after 8 weeks dependent on progress.)  PLANNED INTERVENTIONS: self care/ADL training, therapeutic exercise, therapeutic activity, neuromuscular re-education, manual therapy, passive range of motion, balance training, functional mobility training, aquatic therapy, electrical stimulation, ultrasound, paraffin, fluidotherapy, moist heat, cryotherapy, patient/family education, cognitive remediation/compensation, visual/perceptual remediation/compensation, energy conservation, coping  strategies training, and DME and/or AE instructions  RECOMMENDED OTHER SERVICES: PT, ST  CONSULTED AND AGREED WITH PLAN OF CARE: Patient  PLAN FOR NEXT SESSION: , monitor pain, progress  HEP, environmental scanning  Albertine Lafoy, OT 12/12/2021, 10:29 AM Theone Murdoch, OTR/L [phone] Phone: [phone] 10:29 AM 12/12/21

## 2021-12-20 ENCOUNTER — Inpatient Hospital Stay: Payer: 59 | Attending: Hematology and Oncology | Admitting: Hematology and Oncology

## 2021-12-20 ENCOUNTER — Encounter: Payer: Self-pay | Admitting: Hematology and Oncology

## 2021-12-20 VITALS — BP 123/87 | HR 94 | Temp 97.7°F | Wt 139.3 lb

## 2021-12-20 DIAGNOSIS — Z9189 Other specified personal risk factors, not elsewhere classified: Secondary | ICD-10-CM

## 2021-12-20 DIAGNOSIS — G08 Intracranial and intraspinal phlebitis and thrombophlebitis: Secondary | ICD-10-CM | POA: Insufficient documentation

## 2021-12-20 DIAGNOSIS — Z7901 Long term (current) use of anticoagulants: Secondary | ICD-10-CM | POA: Diagnosis not present

## 2021-12-20 NOTE — Progress Notes (Signed)
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  Please refer to my first note for complete history.   During her last visit, she has mentioned to me that she has seen a hematologist by name Dr. Lynann Bologna in [place] and he has done a lot of blood work.  She mentioned to [place] that she did bring a copy of this blood work.  She is here to discuss about the blood work results.  However since she has last seen [place], we did not receive any further lab results from her previous doctors. She today tells me that she has tried to contact Dr. Lynann Bologna from [place] and he is not willing to share the results for some unclear reason.  She however gave her PCP's information to [place] today to request medical release.  I have offered to do hypercoagulable work-up today but she is reluctant for more blood draw.   She understands that we need this information to comment on anticoagulation recommendations.   For now she should continue anticoagulation for at least 3 months and return to clinic around the same time to discuss long-term anticoagulation needs. She was recommended to follow-up with her PCP for rest of the medical needs as well as her neurologist. All her questions were answered to the best of my knowledge.  We have previously discussed risks of anticoagulation as well as the benefits.  We have briefly reviewed her MRI results which shows persistent clot but improved clot.  She should continue to work with her physical therapist since she has ongoing weakness. Return to clinic in 3 months  HISTORY OF PRESENTING ILLNESS:   April Reilly 51 y.o. female is here because of DVT/cerebral venous sinus  thrombosis.  This is a pleasant 51 year old female patient who initially went to the urgent care with abnormal vaginal bleeding started on Megace and then was admitted with 3-day history of malaise somnolence was found to have right hemorrhagic infarct, right lower lobe aspiration pneumonia.  She was also found to have subocclusive clot in the right distal transverse sinus right sigmoid dural venous sinus.  She was initially started on IV heparin, transition to Lovenox which she did not tolerate well and hence she is on Eliquis right now.  She has been tolerating Eliquis well.    She is here for follow-up on Eliquis.  She has been tolerating this very well.  She denies any new complaints except for ongoing weakness and the need to work with physical therapy.  She thinks she is doing well from the speech therapy standpoint.  She has talked to her PCP who requested a medical release to share her lab work.  No bleeding complaints.  Rest of the pertinent 10 point ROS reviewed and negative  MEDICAL HISTORY:  Past Medical History:  Diagnosis Date   Asthma    Vaginal bleeding, abnormal    for 4 months    SURGICAL HISTORY: Past Surgical History:  Procedure Laterality Date   FOOT SURGERY     TUBAL LIGATION      SOCIAL HISTORY: Social History   Socioeconomic History   Marital status: Single    Spouse name: Not on file   Number of children: Not  on file   Years of education: Not on file   Highest education level: Not on file  Occupational History   Not on file  Tobacco Use   Smoking status: Never   Smokeless tobacco: Never  Substance and Sexual Activity   Alcohol use: Yes   Drug use: Never   Sexual activity: Yes  Other Topics Concern   Not on file  Social History Narrative   Not on file   Social Determinants of Health   Financial Resource Strain: Not on file  Food Insecurity: Not on file  Transportation Needs: Not on file  Physical Activity: Not on file  Stress: Not on file   Social Connections: Not on file  Intimate Partner Violence: Not on file    FAMILY HISTORY: Family History  Problem Relation Age of Onset   Clotting disorder Mother     ALLERGIES:  is allergic to megestrol.  MEDICATIONS:  Current Outpatient Medications  Medication Sig Dispense Refill   ACETAMINOPHEN-BUTALBITAL 50-325 MG TABS Take 1 tablet by mouth 2 (two) times daily as needed.     apixaban (ELIQUIS) 5 MG TABS tablet Take 1 tablet (5 mg total) by mouth 2 (two) times daily. 60 tablet 4   atorvastatin (LIPITOR) 40 MG tablet Take 1 tablet (40 mg total) by mouth daily. 30 tablet 0   levETIRAcetam (KEPPRA) 500 MG tablet Take 1 tablet (500 mg total) by mouth 2 (two) times daily. 180 tablet 3   levETIRAcetam (KEPPRA) 500 MG tablet Take 1 tablet (500 mg total) by mouth 2 (two) times daily. 180 tablet 3   metoprolol tartrate (LOPRESSOR) 25 MG tablet Take 1/2 tablet (12.5 mg total) by mouth 2 (two) times daily. 30 tablet 0   PARoxetine (PAXIL) 10 MG tablet Take 1 tablet (10 mg total) by mouth daily. 30 tablet 0   topiramate (TOPAMAX) 25 MG tablet      traMADol (ULTRAM) 50 MG tablet Take 0.5 tablets (25 mg total) by mouth 2 (two) times daily as needed for moderate pain. 5 tablet 0   No current facility-administered medications for this visit.     PHYSICAL EXAMINATION: ECOG PERFORMANCE STATUS: 0 - Asymptomatic  Vitals:   12/20/21 1526  BP: 123/87  Pulse: 94  Temp: 97.7 F (36.5 C)  SpO2: 98%   Filed Weights   12/20/21 1526  Weight: 139 lb 4.8 oz (63.2 kg)   Physical Exam Constitutional:      Appearance: Normal appearance.  Cardiovascular:     Rate and Rhythm: Normal rate and regular rhythm.     Pulses: Normal pulses.     Heart sounds: Normal heart sounds.  Pulmonary:     Effort: Pulmonary effort is normal.     Breath sounds: Normal breath sounds.  Musculoskeletal:     Cervical back: Normal range of motion and neck supple. No rigidity.  Lymphadenopathy:     Cervical: No  cervical adenopathy.  Neurological:     Mental Status: She is alert.     Motor: Weakness (Left upper extremity and lower extremity weakness noted) present.  Psychiatric:        Mood and Affect: Mood normal.     LABORATORY DATA:  I have reviewed the data as listed Lab Results  Component Value Date   WBC 4.2 10/19/2021   HGB 12.5 10/19/2021   HCT 39.8 10/19/2021   MCV 78.7 (L) 10/19/2021   PLT 253 10/19/2021     Chemistry      Component Value Date/Time  NA 137 10/19/2021 1250   K 3.9 10/19/2021 1250   CL 106 10/19/2021 1250   CO2 23 10/19/2021 1250   BUN <5 (L) 10/19/2021 1250   CREATININE 0.76 10/19/2021 1250      Component Value Date/Time   CALCIUM 9.3 10/19/2021 1250   ALKPHOS 83 09/20/2021 0544   AST 53 (H) 09/20/2021 0544   ALT 80 (H) 09/20/2021 0544   BILITOT 0.3 09/20/2021 0544      MRI Brain 09/10/21 1. Large area of cortical infarction in the right temporal, occipital, parietal, and posterior frontal lobes, with significant associated edema and redemonstrated intraparenchymal hemorrhage. The mass effect causes 11 mm of right-to-left midline shift, medial displacement of the right uncus and right temporal horn and effacement of the basal cisterns, unchanged from the prior CT and raising concern for right uncal herniation. 2. Additional focal area of restricted diffusion in the medial aspect of the right basal ganglia, suspected to be in the preoptic area of the hypothalamus, likely sequela of aforementioned mass effect. 3. Hemorrhage layering in the occipital horns of the lateral ventricles, with redemonstrated effacement of the right lateral and third ventricle. No evidence of hydrocephalus. 4. Small amount of subdural hemorrhage along the right frontal lobe, measuring up to 4 mm. 5. Filling defect in the distal right transverse sinus and proximal and mid right sigmoid sinus, consistent with thrombus, as seen on the same-day CTV.    RADIOGRAPHIC STUDIES: I have  personally reviewed the radiological images as listed and agreed with the findings in the report. CT VENOGRAM HEAD  Result Date: 11/29/2021 CLINICAL DATA:  Follow-up examination for dural sinus thrombosis. EXAM: CT VENOGRAM HEAD TECHNIQUE: Venographic phase images of the brain were obtained following the administration of intravenous contrast. Multiplanar reformats and maximum intensity projections were generated. RADIATION DOSE REDUCTION: This exam was performed according to the departmental dose-optimization program which includes automated exposure control, adjustment of the mA and/or kV according to patient size and/or use of iterative reconstruction technique. CONTRAST:  174m OMNIPAQUE IOHEXOL 350 MG/ML SOLN COMPARISON:  Prior CT from 09/17/2021. FINDINGS: Brain: There has been interval evolution of previously identified hemorrhagic venous infarction involving the posterior right cerebral hemisphere, now chronic in appearance. Previously seen blood products have resolved. No new intracranial hemorrhage. No other acute large vessel territory infarct. No mass lesion, mass effect or midline shift. No hydrocephalus or extra-axial fluid collection. Vascular: No abnormal hyperdense vessel seen prior to contrast administration. Following contrast administration normal enhancement seen throughout the superior sagittal sinus to the torcula. Left transverse and sigmoid sinus is are widely patent as is the visualized proximal left internal jugular vein. Straight sinus, vein of Galen, internal cerebral veins, and basal veins of Rosenthal are patent. Right transverse sinus patent proximally. There is residual nonocclusive thrombus involving the distal right transverse and sigmoid sinuses, somewhat improved in appearance as compared to previous exam (series 8, images 144-137). Right jugular bulb and proximal right internal jugular vein appear patent. No new dural sinus thrombosis. No appreciable cortical vein thrombosis.  Skull: Scalp soft tissues demonstrate no acute finding. Calvarium intact. No focal osseous lesions. Sinuses/Orbits: Globes and orbital soft tissues within normal limits. Visualized paranasal sinuses are clear. No mastoid effusion. IMPRESSION: 1. 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 Previously seen blood products have resolved. 2. Residual nonocclusive thrombus involving the distal right transverse and sigmoid sinuses, persistent but somewhat improved in appearance as compared to previous exam. No  new dural sinus thrombosis. 3. No other new acute intracranial abnormality. Electronically Signed   By: Jeannine Boga M.D.   On: 11/29/2021 07:10    All questions were answered. The patient knows to call the clinic with any problems, questions or concerns. I spent 30 min in the care of this patient including H and P, review of records, counseling and coordination of care.     Benay Pike, MD 12/20/2021 3:27 PM

## 2021-12-21 ENCOUNTER — Ambulatory Visit: Payer: 59 | Admitting: Occupational Therapy

## 2021-12-24 ENCOUNTER — Ambulatory Visit (INDEPENDENT_AMBULATORY_CARE_PROVIDER_SITE_OTHER): Payer: 59 | Admitting: Neurology

## 2021-12-24 ENCOUNTER — Encounter: Payer: Self-pay | Admitting: Neurology

## 2021-12-24 ENCOUNTER — Other Ambulatory Visit (HOSPITAL_COMMUNITY): Payer: Self-pay

## 2021-12-24 ENCOUNTER — Ambulatory Visit: Payer: 59 | Attending: Physical Medicine and Rehabilitation | Admitting: Occupational Therapy

## 2021-12-24 VITALS — BP 117/83 | HR 87 | Ht 59.0 in | Wt 138.0 lb

## 2021-12-24 DIAGNOSIS — M25512 Pain in left shoulder: Secondary | ICD-10-CM | POA: Diagnosis present

## 2021-12-24 DIAGNOSIS — M6281 Muscle weakness (generalized): Secondary | ICD-10-CM | POA: Insufficient documentation

## 2021-12-24 DIAGNOSIS — R41844 Frontal lobe and executive function deficit: Secondary | ICD-10-CM | POA: Diagnosis present

## 2021-12-24 DIAGNOSIS — G08 Intracranial and intraspinal phlebitis and thrombophlebitis: Secondary | ICD-10-CM

## 2021-12-24 DIAGNOSIS — R569 Unspecified convulsions: Secondary | ICD-10-CM | POA: Diagnosis not present

## 2021-12-24 DIAGNOSIS — R41840 Attention and concentration deficit: Secondary | ICD-10-CM | POA: Diagnosis present

## 2021-12-24 DIAGNOSIS — R278 Other lack of coordination: Secondary | ICD-10-CM | POA: Diagnosis present

## 2021-12-24 DIAGNOSIS — G43009 Migraine without aura, not intractable, without status migrainosus: Secondary | ICD-10-CM | POA: Diagnosis not present

## 2021-12-24 DIAGNOSIS — D5703 Hb-ss disease with cerebral vascular involvement: Secondary | ICD-10-CM | POA: Diagnosis not present

## 2021-12-24 DIAGNOSIS — R2681 Unsteadiness on feet: Secondary | ICD-10-CM | POA: Insufficient documentation

## 2021-12-24 MED ORDER — BUTALBITAL-ACETAMINOPHEN 50-325 MG PO TABS
1.0000 | ORAL_TABLET | Freq: Two times a day (BID) | ORAL | 0 refills | Status: DC | PRN
  Filled 2021-12-24: qty 60, 30d supply, fill #0

## 2021-12-24 MED ORDER — METOPROLOL TARTRATE 25 MG PO TABS
12.5000 mg | ORAL_TABLET | Freq: Two times a day (BID) | ORAL | 0 refills | Status: DC
  Filled 2021-12-24: qty 30, 30d supply, fill #0

## 2021-12-24 MED ORDER — PAROXETINE HCL 10 MG PO TABS
10.0000 mg | ORAL_TABLET | Freq: Every day | ORAL | 0 refills | Status: DC
  Filled 2021-12-24: qty 30, 30d supply, fill #0

## 2021-12-24 MED ORDER — TOPIRAMATE 50 MG PO TABS
50.0000 mg | ORAL_TABLET | Freq: Every evening | ORAL | 3 refills | Status: DC
  Filled 2021-12-24: qty 30, 30d supply, fill #0
  Filled 2022-01-30 – 2022-02-08 (×2): qty 30, 30d supply, fill #1

## 2021-12-24 MED ORDER — CYCLOBENZAPRINE HCL 10 MG PO TABS
10.0000 mg | ORAL_TABLET | Freq: Three times a day (TID) | ORAL | 0 refills | Status: DC | PRN
  Filled 2021-12-24: qty 90, 30d supply, fill #0

## 2021-12-24 MED ORDER — ATORVASTATIN CALCIUM 40 MG PO TABS
40.0000 mg | ORAL_TABLET | Freq: Every day | ORAL | 0 refills | Status: DC
  Filled 2021-12-24: qty 30, 30d supply, fill #0

## 2021-12-24 NOTE — Progress Notes (Signed)
[facility]  PATIENT: April Reilly DOB: 1970/09/14  REQUESTING CLINICIAN: No ref. provider found HISTORY FROM: Patient/daughter  REASON FOR VISIT: Venous thrombosis    HISTORICAL  CHIEF COMPLAINT:  Chief Complaint  Patient presents with   Follow-up    Room 13, alone  Pt does not have PCP, states she is unable to refill medications Today c/o neck and shoulder pain, daily headaches, would like to discuss CT results      INTERVAL HISTORY 12/24/21:  Patient presents today for follow-up, her CT venogram was repeated and it showed residual nonocclusive thrombus involving the distal right transverse and sigmoid sinus sinuses.,  Persistent but somewhat improved in appearance as compared to previous exam.  No new dural sinus thrombosis.  She remains on Eliquis 5 mg twice daily.  She did follow-up with primary care doctor and hematology.  She is pending a complete hematology work-up.  Currently she is complaining of left shoulder arm pain, some migraine headaches.  She ran out of some of her medications more than a month ago and have not refill it.    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  She was initiated on anticoagulation.  Neurosurgery was consulted but patient did not have a craniectomy.  She developed seizures, started on Keppra  Her symptoms improve, she was discharge to acute rehab and her plan was to continue with anticoagulation for at least 6 months.  Patient report prior to her hospitalization she has  been doing well, she has chronic anemia, sickle cell disease but she was not taken on any medication.  She reports a few weeks prior to presentation she developed vaginal bleeding and she was started on megestrol acetate.  Since discharge from the hospital, daughter reported improvement of her mental status, confusion improved, she does continue with physical therapy and she is getting more independent.  She is compliant with all medications.  Patient reports prior to her hospitalization she was not taking any medication but since discharge she is on 13 medications, she feels like she does not need all of them.   Hospital course below. Ikea Demicco is a 51 y.o. female with history of SS trait, uterine fibroid, abnormal vaginal bleeding x4 months who was admitted on 09/10/2021 with 3-day history of malaise, fall and progressive somnolence.  She was found to have RLL aspiration pneumonia as well as IPH due to right hemorrhagic infarct with edema and right frontal, parietal, occipital and temporal lobes and 9 mm right to left midline shift.  She was started on hypertonic saline and EEG done showing evidence of epileptogenicity with high potential for seizures.  Work-up also revealed subocclusive clot in right distal right transverse sinus and right sigmoid dural venous sinus and she was started on IV heparin.  Dr. Erlinda Hong felt that patient with large right frontoparietal venous infarct with hemorrhagic conversion due to unclear etiology.  Neurosurgery was consulted due to concerns about the herniation and recommended conservative care with serial CCT for monitoring. She was loaded with Dilantin due to concerns of seizure  and placed on long-term EEG.  Dilantin was discontinued and Keppra added on 02/26.  Hypertonic saline was weaned off and mentation was slowly improving.  Repeat CTA/V of 02/27 showed evolving hemorrhagic infarct with mild improvement in mass effect and improved nonocclusive thrombus in right distal  transverse and sigmoid sinus.  She was transition to [name] on 02/28 with neurology recommending 3 to 6 months of AC as well as close neurological follow-up.  Hospital course was significant for issues with headaches, hypotension, tachycardia, left-sided weakness with balance deficits as well as cognitive deficits and disorientation.  CIR was recommended due to functional decline.     OTHER MEDICAL CONDITIONS: SS Trait, anemia    REVIEW OF SYSTEMS: Full 14 system review of systems performed and negative with exception of: as noted in the HPI   ALLERGIES: Allergies  Allergen Reactions   Megestrol Other (See Comments) and Swelling    Caused blood clot in brain Caused blood clot in brain    HOME MEDICATIONS: Outpatient Medications Prior to Visit  Medication Sig Dispense Refill   apixaban (ELIQUIS) 5 MG TABS tablet Take 1 tablet (5 mg total) by mouth 2 (two) times daily. 60 tablet 4   levETIRAcetam (KEPPRA) 500 MG tablet Take 1 tablet (500 mg total) by mouth 2 (two) times daily. 180 tablet 3   levETIRAcetam (KEPPRA) 500 MG tablet Take 1 tablet (500 mg total) by mouth 2 (two) times daily. 180 tablet 3   ACETAMINOPHEN-BUTALBITAL 50-325 MG TABS Take 1 tablet by mouth 2 (two) times daily as needed.     atorvastatin (LIPITOR) 40 MG tablet Take 1 tablet (40 mg total) by mouth daily. 30 tablet 0   metoprolol tartrate (LOPRESSOR) 25 MG tablet Take 1/2 tablet (12.5 mg total) by mouth 2 (two) times daily. 30 tablet 0   PARoxetine (PAXIL) 10 MG tablet Take 1 tablet (10 mg total) by mouth daily. 30 tablet 0   topiramate (TOPAMAX) 25 MG tablet      traMADol (ULTRAM) 50 MG tablet Take 0.5 tablets (25 mg total) by mouth 2 (two) times daily as needed for moderate pain. 5 tablet 0   No facility-administered medications prior to visit.    PAST MEDICAL HISTORY: Past Medical History:  Diagnosis Date   Asthma    Vaginal bleeding, abnormal    for 4 months    PAST SURGICAL HISTORY: Past Surgical  History:  Procedure Laterality Date   FOOT SURGERY     TUBAL LIGATION      FAMILY HISTORY: Family History  Problem Relation Age of Onset   Clotting disorder Mother     SOCIAL HISTORY: Social History   Socioeconomic History   Marital status: Single    Spouse name: Not on file   Number of children: Not on file   Years of education: Not on file   Highest education level: Not on file  Occupational History   Not on file  Tobacco Use   Smoking status: Never   Smokeless tobacco: Never  Substance and Sexual Activity   Alcohol use: Yes   Drug use: Never   Sexual activity: Yes  Other Topics Concern   Not on file  Social History Narrative   Not on file   Social Determinants of Health   Financial Resource Strain: Not on file  Food Insecurity: Not on file  Transportation Needs: Not on file  Physical Activity: Not on file  Stress: Not on file  Social Connections: Not on file  Intimate Partner Violence:  Not on file    PHYSICAL EXAM  GENERAL EXAM/CONSTITUTIONAL: Vitals:  Vitals:   12/24/21 1118  BP: 117/83  Pulse: 87  Weight: 138 lb (62.6 kg)  Height: '4\' 11"'$  (1.499 m)   Body mass index is 27.87 kg/m. Wt Readings from Last 3 Encounters:  12/24/21 138 lb (62.6 kg)  12/20/21 139 lb 4.8 oz (63.2 kg)  11/27/21 136 lb 9.6 oz (62 kg)   Patient is in no distress; well developed, nourished and groomed; neck is supple  EYES: Pupils round and reactive to light, Visual fields full to confrontation, Extraocular movements intacts,   MUSCULOSKELETAL: Gait, strength, tone, movements noted in Neurologic exam below  NEUROLOGIC: MENTAL STATUS:      View : No data to display.         awake, alert, oriented to person, place and time recent and remote memory intact normal attention and concentration language fluent, comprehension intact, naming intact fund of knowledge appropriate  CRANIAL NERVE:  2nd, 3rd, 4th, 6th - pupils equal and reactive to light, visual fields  full to confrontation, extraocular muscles intact, no nystagmus 5th - facial sensation symmetric 7th - facial strength symmetric 8th - hearing intact 9th - palate elevates symmetrically, uvula midline 11th - shoulder shrug symmetric 12th - tongue protrusion midline  MOTOR:  normal bulk and tone, full strength in the BUE, BLE  SENSORY:  normal and symmetric to light touch, pinprick, temperature, vibration  COORDINATION:  finger-nose-finger, fine finger movements normal  REFLEXES:  deep tendon reflexes present and symmetric  GAIT/STATION:  normal   DIAGNOSTIC DATA (LABS, IMAGING, TESTING) - I reviewed patient records, labs, notes, testing and imaging myself where available.  Lab Results  Component Value Date   WBC 4.2 10/19/2021   HGB 12.5 10/19/2021   HCT 39.8 10/19/2021   MCV 78.7 (L) 10/19/2021   PLT 253 10/19/2021      Component Value Date/Time   NA 137 10/19/2021 1250   K 3.9 10/19/2021 1250   CL 106 10/19/2021 1250   CO2 23 10/19/2021 1250   GLUCOSE 92 10/19/2021 1250   BUN <5 (L) 10/19/2021 1250   CREATININE 0.76 10/19/2021 1250   CALCIUM 9.3 10/19/2021 1250   PROT 6.8 09/20/2021 0544   ALBUMIN 3.0 (L) 09/20/2021 0544   AST 53 (H) 09/20/2021 0544   ALT 80 (H) 09/20/2021 0544   ALKPHOS 83 09/20/2021 0544   BILITOT 0.3 09/20/2021 0544   GFRNONAA >60 10/19/2021 1250   Lab Results  Component Value Date   CHOL 139 09/11/2021   HDL 46 09/11/2021   LDLCALC 84 09/11/2021   TRIG 47 09/11/2021   CHOLHDL 3.0 09/11/2021   Lab Results  Component Value Date   HGBA1C 5.3 09/11/2021   Lab Results  Component Value Date   VITAMINB12 364 09/11/2021   Lab Results  Component Value Date   TSH 0.164 (L) 09/11/2021   Head CT 09/10/21 Large region of cortical/subcortical edema within the right frontal, parietal, occipital and temporal lobes, as well as right insula and subinsular region. Superimposed patchy foci of acute parenchymal hemorrhage within the right  parietal, occipital and temporal lobes. These findings are favored to reflect an acute/early subacute infarct from arterial ischemia (with hemorrhagic conversion) or a hemorrhagic infarct due to intracranial venous thrombosis. An MRI of the brain with contrast (including thin-slice [id number] post-contrast imaging for assessment of the intracranial venous structures) is recommended for further evaluation. A CTA of the head/neck should also be considered.   Associated  mass effect with significant partial effacement of the right lateral ventricle and 9 mm leftward midline shift. The basal cisterns are effaced. Neurosurgical consultation is advised.   Moderate-volume extension of hemorrhage into the right lateral ventricle is questioned. No definite evidence of hydrocephalus or ventricular entrapment at this time   MRI Brain 09/10/21 1. Large area of cortical infarction in the right temporal, occipital, parietal, and posterior frontal lobes, with significant associated edema and redemonstrated intraparenchymal hemorrhage. The mass effect causes 11 mm of right-to-left midline shift, medial displacement of the right uncus and right temporal horn and effacement of the basal cisterns, unchanged from the prior CT and raising concern for right uncal herniation. 2. Additional focal area of restricted diffusion in the medial aspect of the right basal ganglia, suspected to be in the preoptic area of the hypothalamus, likely sequela of aforementioned mass effect. 3. Hemorrhage layering in the occipital horns of the lateral ventricles, with redemonstrated effacement of the right lateral and third ventricle. No evidence of hydrocephalus. 4. Small amount of subdural hemorrhage along the right frontal lobe, measuring up to 4 mm. 5. Filling defect in the distal right transverse sinus and proximal and mid right sigmoid sinus, consistent with thrombus, as seen on the same-day CTV.   LTM EEG 09/11/21 - Sharp waves,   right hemisphere, maximal right parieto-occipital region  - Continuous slow, generalized and lateralized right hemisphere     ASSESSMENT AND PLAN  51 y.o. year old female with medical history of sickle cell, chronic anemia here for follow up for large right hemispheric intracerebral hemorrhage due to cerebral venous sinus thrombosis.  Patient did have subsequent seizures and brain edema.  She has been anticoagulated initially on heparin and now on DOAC.  She is currently on Apixaban 5 mg BID,  Lipitor 40 mg daily, Keppra 500 mg twice daily, metoprolol 12.5 mg twice daily and paroxetine 10 mg daily.  Plan will be to continue patient on current medications, refill given.  I will also repeat the CT venogram in 76-monthto see if there is complete resolution of the thrombus. At next visit, if no additional seizure, plan to discontinue Keppra.  They were also concerned regarding patient PCP.  I am listed as her PCP but obviously I am not, I am her neurologist.  She did follow-up with nurse practitioner ADurene Fruitsto establish care as a PMD.  I will forward this note to ADurene Fruits NP  1. Cerebral venous sinus thrombosis   [place]. [place]   3. Sickle cell disease with cerebrovascular involvement ([place]   4. Migraine without aura and without status migrainosus, not intractable     Patient Instructions  Continue currents medications (Refill given)  Increase Topamax to 50 mg nightly (migraine prevention)  [place] CT [place]. Follow up in 3 months    Orders Placed This Encounter  Procedures   CT VENOGRAM HEAD    Meds ordered this encounter  Medications   ACETAMINOPHEN-BUTALBITAL 50-325 MG TABS    Sig: Take 1 tablet by mouth 2 (two) times daily as needed.    Dispense:  60 tablet    Refill:  0   atorvastatin (LIPITOR) 40 MG tablet    Sig: Take 1 tablet (40 mg total) by mouth daily.    Dispense:  30 tablet    Refill:  0   PARoxetine  (PAXIL) 10 MG tablet    Sig: Take 1 tablet (10  mg total) by mouth daily.    Dispense:  30 tablet    Refill:  0   topiramate (TOPAMAX) 50 MG tablet    Sig: Take 1 tablet (50 mg total) by mouth at bedtime.    Dispense:  30 tablet    Refill:  3   metoprolol tartrate (LOPRESSOR) 25 MG tablet    Sig: Take 1/2 tablet (12.5 mg total) by mouth 2 (two) times daily.    Dispense:  30 tablet    Refill:  0   cyclobenzaprine (FLEXERIL) 10 MG tablet    Sig: Take 1 tablet (10 mg total) by mouth 3 (three) times daily as needed for muscle spasms.    Dispense:  90 tablet    Refill:  0    Return in about 11 weeks (around 03/11/2022).  I have spent a total of 50 minutes dedicated to this patient today, preparing to see patient, performing a medically appropriate examination and evaluation, ordering tests and/or medications and procedures, and counseling and educating the patient/family/caregiver; independently interpreting result and communicating results to the family/patient/caregiver; and documenting clinical information in the electronic medical record.    Alric Ran, MD 12/24/2021, 1:41 PM  [facility] [address] [place], [place] [address] [phone]

## 2021-12-24 NOTE — Therapy (Signed)
Patient Name: April Reilly MRN: [id number] DOB:1970/10/03, 51 y.o., female Today's Date: 12/24/2021  PCP: Pcp, No REFERRING PROVIDER: Izora Ribas, MD  END OF SESSION:   OT End of Session - 12/24/21 1110     Visit Number 12    Number of Visits 25    Date for OT Re-Evaluation 01/07/22    Authorization Type [facility]    OT Start Time 1031    OT Stop Time 1100    OT Time Calculation (min) 29 min                           Past Medical History:  Diagnosis Date   Asthma    Vaginal bleeding, abnormal    for 4 months   Past Surgical History:  Procedure Laterality Date   FOOT SURGERY     TUBAL LIGATION     Patient Active Problem List   Diagnosis Date Noted   Other complicated headache syndrome 09/26/2021   Hyponatremia 09/26/2021   Abnormal LFTs 09/26/2021   Dysfunctional uterine bleeding 09/26/2021   Uterine fibroid 09/19/2021   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   Essential hypertension 09/16/2021   Hyperlipidemia 09/16/2021   Seizure ([place]) 09/16/2021   Aspiration pneumonia ([place]) 09/16/2021   Sickle cell disease ([place]) 09/16/2021   Iron deficiency anemia 09/16/2021   Cerebral edema ([place]) 29/24/4628   Acute metabolic encephalopathy 63/81/7711   ICH (intracerebral hemorrhage) ([place]) 09/10/2021   Reactive airway disease without complication 65/79/0383    ONSET DATE: 09/10/21  REFERRING DIAG: ICH-R frontal, parietal, occipital and temporal hemorrhage  THERAPY DIAG:  Muscle weakness (generalized)  Other lack of coordination  Frontal lobe and executive function deficit  Attention and concentration deficit  Acute pain of left shoulder  Unsteadiness on feet  SUBJECTIVE:   SUBJECTIVE STATEMENT: Pt reports left shoulder pain Pt accompanied by: self  PERTINENT HISTORY: 51 y.o. female admitted to [facility] on 09/10/2021 after fall out of bed followed by AMS. CT: significant edema in R frontal, parietal, occipital and temporal  lobe with superimposed hemorrhage, . PMH includes sickle cell trait, anemia. Pt. d/c home on 09/26/21 following CIR.   PRECAUTIONS: Other: sickle cell , seizures, no driving, no work  WEIGHT BEARING RESTRICTIONS No  PAIN:  Are you having pain? Yes: NPRS scale: 6/10 Pain location: left right shoulder, Pain description: aching Aggravating factors: movement Relieving factors: Not moving it , heat  FALLS: Has patient fallen in last 6 months? No,   LIVING ENVIRONMENT: Lives with: alone Lives in: House/apartment Stairs: Yes: External: 3 steps; none Has following equipment at home: Shower bench  PLOF: Independent, head chef at [employer]   PATIENT GOALS decrease shoulder pain    Treatment: . Hotpack applied to neck and left shoulder x 8 mins no adverse reactions, while pt  while therapist discussed shoulder pain and potentially placing therapy on hold until after pt receives MRI. Therapist wrote an in -basket note to pt's  neurologist regarding progress and discussed with pt. No adverse reactions to heat Gentle mobs to left shoulder and traps. Seated low range shoulder flexion and abduction with UE ranger for AA/ROM as well as shoulder flexion and circumduction  min v.c for shoulder positioning. Low range chest press with cane, min v.c, mirror used for feedback to avoid shoulder hiking.       SHORT TERM GOALS: Target date: 01/21/2022  I with initial HEP  Goal status:  ongoing, issued needs reinforcement  2.  Pt will demonstrate ability to retrieve a lightweight object at 90* with no more than min compensations and pain no greater than 3/10.Marland Kitchen Baseline: 85* Goal status: ongoing  3.  Pt will perform basic home management with supervision demonstrating good safety awareness. Baseline:dependent Goal status: met washing ,sweeping and mopping   4.  Pt will perform basic cooking with no more than min v.c/ min A demonstrating good safety awareness. Baseline: dependent- pt was a chef Goal  status: goal met  5.  Pt will perform a  a simple functional organization task such as generating a menu and grocery  list with no more than min v.c  Goal status: Goal met  6.  Pt will increase LUE grip strength by 5 lbs for increased LUE functional use. Baseline: 44.7 lbs Goal status: ongoing- less due to wrist pain 28.6   7. Pt will perform all basic ADLS modified independently-ongoing,  supervision for shower, mod I for remainder of ADLS  LONG TERM GOALS: Target date:01/07/22  I with updated HEP  Goal status: ongoing  2.  Pt will perform mod complex home management mod I   Goal status: ongoing  3.  Pt will perform mod complex cooking modified independently demonstrating good safety awareness.  Goal status:  ongoing  4.  Pt will perform simulated work activities mod I  Goal status:  ongoing  5.  Pt will retrieve a light weight object at 110* shoulder flexion with pain no greater than 3/10.  Goal status:  ongoing 6. Pt will demonstrate ability to perform a physical and cognitive task simultaneously with 90%of better accuracy. Goal status: initial   8. Pt will perform tabletop and environmental scanning tasks with 90% or better accuracy. Goal status:  ongoing  ASSESSMENT:  CLINICAL IMPRESSION: CLINICAL IMPRESSION: Pt is progressing towards goals slowly. Pt reports her pain  in L shoulder is significant and impacts sleep. PRECAUTIONS: Other: sickle cell  PERFORMANCE DEFICITS in functional skills including ADLs, IADLs, coordination, dexterity, ROM, strength, pain, flexibility, FMC, GMC, mobility, balance, decreased knowledge of precautions, decreased knowledge of use of DME, and UE functional use, cognitive skills including attention, energy/drive, learn, memory, problem solving, safety awareness, sequencing, thought, and understand, and psychosocial skills including coping strategies and routines and behaviors.   IMPAIRMENTS are limiting patient from ADLs, IADLs, rest  and sleep, work, play, leisure, and social participation.   COMORBIDITIES may have co-morbidities  that affects occupational performance. Patient will benefit from skilled OT to address above impairments and improve overall function.  MODIFICATION OR ASSISTANCE TO COMPLETE EVALUATION: No modification of tasks or assist necessary to complete an evaluation.  OT OCCUPATIONAL PROFILE AND HISTORY: Detailed assessment: Review of records and additional review of physical, cognitive, psychosocial history related to current functional performance.  CLINICAL DECISION MAKING: LOW - limited treatment options, no task modification necessary  REHAB POTENTIAL: Good  EVALUATION COMPLEXITY: Moderate    PLAN: OT FREQUENCY: 2x/week  OT DURATION: 12 weeks plus eval- (POC written for 12 weeks to account for scheduling, anticipate d/c after 8 weeks dependent on progress.)  PLANNED INTERVENTIONS: self care/ADL training, therapeutic exercise, therapeutic activity, neuromuscular re-education, manual therapy, passive range of motion, balance training, functional mobility training, aquatic therapy, electrical stimulation, ultrasound, paraffin, fluidotherapy, moist heat, cryotherapy, patient/family education, cognitive remediation/compensation, visual/perceptual remediation/compensation, energy conservation, coping strategies training, and DME and/or AE instructions  RECOMMENDED OTHER SERVICES: PT, ST  CONSULTED AND AGREED WITH PLAN OF CARE: Patient  PLAN FOR NEXT  SESSION: , monitor pain, progress  HEP, environmental scanning  PRECAUTIONS: Other: sickle cell  PERFORMANCE DEFICITS in functional skills including ADLs, IADLs, coordination, dexterity, ROM, strength, pain, flexibility, FMC, GMC, mobility, balance, decreased knowledge of precautions, decreased knowledge of use of DME, and UE functional use, cognitive skills including attention, energy/drive, learn, memory, problem solving, safety awareness, sequencing,  thought, and understand, and psychosocial skills including coping strategies and routines and behaviors.   IMPAIRMENTS are limiting patient from ADLs, IADLs, rest and sleep, work, play, leisure, and social participation.   COMORBIDITIES may have co-morbidities  that affects occupational performance. Patient will benefit from skilled OT to address above impairments and improve overall function.  MODIFICATION OR ASSISTANCE TO COMPLETE EVALUATION: No modification of tasks or assist necessary to complete an evaluation.  OT OCCUPATIONAL PROFILE AND HISTORY: Detailed assessment: Review of records and additional review of physical, cognitive, psychosocial history related to current functional performance.  CLINICAL DECISION MAKING: LOW - limited treatment options, no task modification necessary  REHAB POTENTIAL: Good  EVALUATION COMPLEXITY: Moderate    PLAN: OT FREQUENCY: 2x/week  OT DURATION: 12 weeks plus eval- (POC written for 12 weeks to account for scheduling, anticipate d/c after 8 weeks dependent on progress.)  PLANNED INTERVENTIONS: self care/ADL training, therapeutic exercise, therapeutic activity, neuromuscular re-education, manual therapy, passive range of motion, balance training, functional mobility training, aquatic therapy, electrical stimulation, ultrasound, paraffin, fluidotherapy, moist heat, cryotherapy, patient/family education, cognitive remediation/compensation, visual/perceptual remediation/compensation, energy conservation, coping strategies training, and DME and/or AE instructions  RECOMMENDED OTHER SERVICES: PT, ST  CONSULTED AND AGREED WITH PLAN OF CARE: Patient  PLAN FOR NEXT SESSION: , monitor pain, progress  HEP, environmental scanning  Aldona Bryner, OT 12/24/2021, 11:12 AM Theone Murdoch, OTR/L [phone] Phone: [phone] 11:12 AM 12/24/21

## 2021-12-24 NOTE — Patient Instructions (Addendum)
Continue currents medications (Refill given)  Increase Topamax to 50 mg nightly (migraine prevention)  Repeat CT Venogram in 2 months in [place] prior to next follow up visit. Follow up in 3 months

## 2021-12-25 ENCOUNTER — Encounter: Payer: 59 | Admitting: Occupational Therapy

## 2021-12-26 ENCOUNTER — Ambulatory Visit: Payer: 59 | Admitting: Occupational Therapy

## 2021-12-26 DIAGNOSIS — R278 Other lack of coordination: Secondary | ICD-10-CM

## 2021-12-26 DIAGNOSIS — R2681 Unsteadiness on feet: Secondary | ICD-10-CM

## 2021-12-26 DIAGNOSIS — R41840 Attention and concentration deficit: Secondary | ICD-10-CM

## 2021-12-26 DIAGNOSIS — M6281 Muscle weakness (generalized): Secondary | ICD-10-CM

## 2021-12-26 DIAGNOSIS — R41844 Frontal lobe and executive function deficit: Secondary | ICD-10-CM

## 2021-12-26 DIAGNOSIS — M25512 Pain in left shoulder: Secondary | ICD-10-CM

## 2021-12-26 NOTE — Therapy (Signed)
Patient Name: April Reilly MRN: [id number] DOB:September 06, 1970, 51 y.o., female Today's Date: 12/24/2021  PCP: Pcp, No REFERRING PROVIDER: Izora Ribas, MD  END OF SESSION:            OT End of Session - 12/26/21 1204     Visit Number 13    Number of Visits 25    Date for OT Re-Evaluation 01/07/22    Authorization Type [facility]    OT Start Time 1105    OT Stop Time 1144    OT Time Calculation (min) 39 min                    Past Medical History:  Diagnosis Date   Asthma    Vaginal bleeding, abnormal    for 4 months   Past Surgical History:  Procedure Laterality Date   FOOT SURGERY     TUBAL LIGATION     Patient Active Problem List   Diagnosis Date Noted   Other complicated headache syndrome 09/26/2021   Hyponatremia 09/26/2021   Abnormal LFTs 09/26/2021   Dysfunctional uterine bleeding 09/26/2021   Uterine fibroid 09/19/2021   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   Essential hypertension 09/16/2021   Hyperlipidemia 09/16/2021   Seizure ([place]) 09/16/2021   Aspiration pneumonia ([place]) 09/16/2021   Sickle cell disease ([place]) 09/16/2021   Iron deficiency anemia 09/16/2021   Cerebral edema ([place]) 41/66/0630   Acute metabolic encephalopathy 16/07/930   ICH (intracerebral hemorrhage) ([place]) 09/10/2021   Reactive airway disease without complication 35/57/3220    ONSET DATE: 09/10/21  REFERRING DIAG: ICH-R frontal, parietal, occipital and temporal hemorrhage  THERAPY DIAG:  Muscle weakness (generalized)  Other lack of coordination  Frontal lobe and executive function deficit  Attention and concentration deficit  Acute pain of left shoulder  Unsteadiness on feet  SUBJECTIVE:   SUBJECTIVE STATEMENT: Pt reports left shoulder pain Pt accompanied by: self  PERTINENT HISTORY: 51 y.o. female admitted to [facility] on 09/10/2021 after fall out of bed followed by AMS. CT: significant edema in R frontal, parietal, occipital and  temporal lobe with superimposed hemorrhage, . PMH includes sickle cell trait, anemia. Pt. d/c home on 09/26/21 following CIR.   PRECAUTIONS: Other: sickle cell, seizures, no driving, no work  WEIGHT BEARING RESTRICTIONS No  PAIN:  Are you having pain? Yes: NPRS scale: 4/10- pt reports MD prescribed muscle relaxers and she is feeling better. Pain location: left right shoulder, Pain description: aching Aggravating factors: movement Relieving factors: Not moving it , heat  FALLS: Has patient fallen in last 6 months? No,   LIVING ENVIRONMENT: Lives with: alone Lives in: House/apartment Stairs: Yes: External: 3 steps; none Has following equipment at home: Shower bench  PLOF: Independent, head chef at [employer]   PATIENT GOALS decrease shoulder pain    Treatment: . Environmental scanning in mod distracting environment, with 100% accuracy Seated low range shoulder flexion, circumduction, abduction  along tabletop  with LUE for tableslides a  min v.c for shoulder positioning.  Low to mid range functional reaching to place graded clothespins on vertical antennae, min v.c for positioning, black clothespins were too difficult(8# pinch) Constant therapy map skills level 2 with 75% accuracy, min v.c Pt completed a 12 piece puzzle without assist in a resonable amount of time      SHORT TERM GOALS: Target date: 01/21/2022  I with initial HEP  Goal status: ongoing, issued needs reinforcement  2.  Pt will demonstrate  ability to retrieve a lightweight object at 90* with no more than min compensations and pain no greater than 3/10.Marland Kitchen Baseline: 85* Goal status: ongoing  3.  Pt will perform basic home management with supervision demonstrating good safety awareness. Baseline:dependent Goal status: met washing ,sweeping and mopping   4.  Pt will perform basic cooking with no more than min v.c/ min A demonstrating good safety awareness. Baseline: dependent- pt was a chef Goal status: goal  met  5.  Pt will perform a  a simple functional organization task such as generating a menu and grocery  list with no more than min v.c  Goal status: Goal met  6.  Pt will increase LUE grip strength by 5 lbs for increased LUE functional use. Baseline: 44.7 lbs Goal status: ongoing- less due to wrist pain 28.6   7. Pt will perform all basic ADLS modified independently-ongoing,  supervision for shower, mod I for remainder of ADLS  LONG TERM GOALS: Target date:01/07/22  I with updated HEP  Goal status: ongoing  2.  Pt will perform mod complex home management mod I   Goal status: ongoing  3.  Pt will perform mod complex cooking modified independently demonstrating good safety awareness.  Goal status:  ongoing  4.  Pt will perform simulated work activities mod I  Goal status:  ongoing  5.  Pt will retrieve a light weight object at 110* shoulder flexion with pain no greater than 3/10.  Goal status:  ongoing 6. Pt will demonstrate ability to perform a physical and cognitive task simultaneously with 90%of better accuracy. Goal status: initial   8. Pt will perform tabletop and environmental scanning tasks with 90% or better accuracy. Goal status:  ongoing  ASSESSMENT:  CLINICAL IMPRESSION: CLINICAL IMPRESSION: Pt is progressing towards goals. Her shoulder pain is better today after MD prescribed muscle relaxers.Pt is traveling to [place] to see her PCP next week. PRECAUTIONS: Other: sickle cell  PERFORMANCE DEFICITS in functional skills including ADLs, IADLs, coordination, dexterity, ROM, strength, pain, flexibility, FMC, GMC, mobility, balance, decreased knowledge of precautions, decreased knowledge of use of DME, and UE functional use, cognitive skills including attention, energy/drive, learn, memory, problem solving, safety awareness, sequencing, thought, and understand, and psychosocial skills including coping strategies and routines and behaviors.   IMPAIRMENTS are limiting  patient from ADLs, IADLs, rest and sleep, work, play, leisure, and social participation.   COMORBIDITIES may have co-morbidities  that affects occupational performance. Patient will benefit from skilled OT to address above impairments and improve overall function.  MODIFICATION OR ASSISTANCE TO COMPLETE EVALUATION: No modification of tasks or assist necessary to complete an evaluation.  OT OCCUPATIONAL PROFILE AND HISTORY: Detailed assessment: Review of records and additional review of physical, cognitive, psychosocial history related to current functional performance.  CLINICAL DECISION MAKING: LOW - limited treatment options, no task modification necessary  REHAB POTENTIAL: Good  EVALUATION COMPLEXITY: Moderate    PLAN: OT FREQUENCY: 2x/week  OT DURATION: 12 weeks plus eval- (POC written for 12 weeks to account for scheduling, anticipate d/c after 8 weeks dependent on progress.)  PLANNED INTERVENTIONS: self care/ADL training, therapeutic exercise, therapeutic activity, neuromuscular re-education, manual therapy, passive range of motion, balance training, functional mobility training, aquatic therapy, electrical stimulation, ultrasound, paraffin, fluidotherapy, moist heat, cryotherapy, patient/family education, cognitive remediation/compensation, visual/perceptual remediation/compensation, energy conservation, coping strategies training, and DME and/or AE instructions  RECOMMENDED OTHER SERVICES: PT, ST  CONSULTED AND AGREED WITH PLAN OF CARE: Patient  PLAN FOR NEXT SESSION: , monitor  pain, progress  HEP, environmental scanning  PRECAUTIONS: Other: sickle cell  PERFORMANCE DEFICITS in functional skills including ADLs, IADLs, coordination, dexterity, ROM, strength, pain, flexibility, FMC, GMC, mobility, balance, decreased knowledge of precautions, decreased knowledge of use of DME, and UE functional use, cognitive skills including attention, energy/drive, learn, memory, problem  solving, safety awareness, sequencing, thought, and understand, and psychosocial skills including coping strategies and routines and behaviors.   IMPAIRMENTS are limiting patient from ADLs, IADLs, rest and sleep, work, play, leisure, and social participation.   COMORBIDITIES may have co-morbidities  that affects occupational performance. Patient will benefit from skilled OT to address above impairments and improve overall function.  MODIFICATION OR ASSISTANCE TO COMPLETE EVALUATION: No modification of tasks or assist necessary to complete an evaluation.  OT OCCUPATIONAL PROFILE AND HISTORY: Detailed assessment: Review of records and additional review of physical, cognitive, psychosocial history related to current functional performance.  CLINICAL DECISION MAKING: LOW - limited treatment options, no task modification necessary  REHAB POTENTIAL: Good  EVALUATION COMPLEXITY: Moderate    PLAN: OT FREQUENCY: 2x/week  OT DURATION: 12 weeks plus eval- (POC written for 12 weeks to account for scheduling, anticipate d/c after 8 weeks dependent on progress.)  PLANNED INTERVENTIONS: self care/ADL training, therapeutic exercise, therapeutic activity, neuromuscular re-education, manual therapy, passive range of motion, balance training, functional mobility training, aquatic therapy, electrical stimulation, ultrasound, paraffin, fluidotherapy, moist heat, cryotherapy, patient/family education, cognitive remediation/compensation, visual/perceptual remediation/compensation, energy conservation, coping strategies training, and DME and/or AE instructions  RECOMMENDED OTHER SERVICES: PT, ST  CONSULTED AND AGREED WITH PLAN OF CARE: Patient  PLAN FOR NEXT SESSION: ,  check goals and renew, monitor pain, progress  HEP, work towards unmet goals.  Rever Pichette, OT 12/24/2021, 11:12 AM Theone Murdoch, OTR/L [phone] Phone: [phone] 11:12 AM 12/24/21

## 2021-12-28 ENCOUNTER — Other Ambulatory Visit (HOSPITAL_COMMUNITY): Payer: Self-pay

## 2021-12-28 MED ORDER — ATORVASTATIN CALCIUM 40 MG PO TABS
40.0000 mg | ORAL_TABLET | Freq: Every day | ORAL | 3 refills | Status: DC
  Filled 2021-12-28: qty 90, 90d supply, fill #0

## 2021-12-28 MED ORDER — LEVETIRACETAM 500 MG PO TABS: 500.0000 mg | ORAL_TABLET | Freq: Two times a day (BID) | ORAL | 3 refills | Status: DC

## 2021-12-28 MED ORDER — MEGESTROL ACETATE 40 MG PO TABS
40.0000 mg | ORAL_TABLET | Freq: Two times a day (BID) | ORAL | 3 refills | Status: DC
  Filled 2021-12-28: qty 60, 30d supply, fill #0

## 2021-12-28 MED ORDER — ZALEPLON 5 MG PO CAPS
5.0000 mg | ORAL_CAPSULE | Freq: Every evening | ORAL | 3 refills | Status: DC
  Filled 2021-12-28: qty 30, 30d supply, fill #0

## 2021-12-28 MED ORDER — DICLOFENAC SODIUM 1 % EX GEL
2.0000 g | Freq: Four times a day (QID) | CUTANEOUS | 3 refills | Status: DC
  Filled 2021-12-28: qty 100, 12d supply, fill #0

## 2021-12-31 ENCOUNTER — Telehealth: Payer: Self-pay | Admitting: Neurology

## 2021-12-31 NOTE — Telephone Encounter (Signed)
[employer] Auth: NPR case #[id number] sent to [facility] they will call the patient to schedule

## 2022-01-01 ENCOUNTER — Other Ambulatory Visit (HOSPITAL_COMMUNITY): Payer: Self-pay

## 2022-01-04 ENCOUNTER — Other Ambulatory Visit (HOSPITAL_COMMUNITY): Payer: Self-pay

## 2022-01-08 ENCOUNTER — Encounter: Payer: Self-pay | Admitting: Occupational Therapy

## 2022-01-08 ENCOUNTER — Ambulatory Visit: Payer: 59 | Admitting: Occupational Therapy

## 2022-01-08 DIAGNOSIS — M25512 Pain in left shoulder: Secondary | ICD-10-CM

## 2022-01-08 DIAGNOSIS — R2681 Unsteadiness on feet: Secondary | ICD-10-CM

## 2022-01-08 DIAGNOSIS — R41840 Attention and concentration deficit: Secondary | ICD-10-CM

## 2022-01-08 DIAGNOSIS — R41844 Frontal lobe and executive function deficit: Secondary | ICD-10-CM

## 2022-01-08 DIAGNOSIS — M6281 Muscle weakness (generalized): Secondary | ICD-10-CM

## 2022-01-08 DIAGNOSIS — R278 Other lack of coordination: Secondary | ICD-10-CM

## 2022-01-08 NOTE — Therapy (Unsigned)
Patient Name: April Reilly MRN: [id number] DOB:24-Apr-1971, 51 y.o., female Today's Date: 12/24/2021  PCP: Pcp, No REFERRING PROVIDER: Izora Ribas, MD  END OF SESSION:            OT End of Session - 01/08/22 1504     Visit Number 14    Number of Visits 25    Date for OT Re-Evaluation 01/07/22    Authorization Type [facility]- [facility]    OT Start Time 1452    OT Stop Time 1530    OT Time Calculation (min) 38 min                    Past Medical History:  Diagnosis Date   Asthma    Vaginal bleeding, abnormal    for 4 months   Past Surgical History:  Procedure Laterality Date   FOOT SURGERY     TUBAL LIGATION     Patient Active Problem List   Diagnosis Date Noted   Other complicated headache syndrome 09/26/2021   Hyponatremia 09/26/2021   Abnormal LFTs 09/26/2021   Dysfunctional uterine bleeding 09/26/2021   Uterine fibroid 09/19/2021   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   Essential hypertension 09/16/2021   Hyperlipidemia 09/16/2021   Seizure ([place]) 09/16/2021   Aspiration pneumonia ([place]) 09/16/2021   Sickle cell disease ([place]) 09/16/2021   Iron deficiency anemia 09/16/2021   Cerebral edema ([place]) 66/12/3014   Acute metabolic encephalopathy 07/30/3233   ICH (intracerebral hemorrhage) ([place]) 09/10/2021   Reactive airway disease without complication 57/32/2025    ONSET DATE: 09/10/21  REFERRING DIAG: ICH-R frontal, parietal, occipital and temporal hemorrhage  THERAPY DIAG:  Muscle weakness (generalized)  Other lack of coordination  Frontal lobe and executive function deficit  Attention and concentration deficit  Acute pain of left shoulder  Unsteadiness on feet  SUBJECTIVE:   SUBJECTIVE STATEMENT: Pt reports left shoulder pain Pt accompanied by: self  PERTINENT HISTORY: 51 y.o. female admitted to [facility] on 09/10/2021 after fall out of bed followed by AMS. CT: significant edema in R frontal, parietal, occipital and  temporal lobe with superimposed hemorrhage, . PMH includes sickle cell trait, anemia. Pt. d/c home on 09/26/21 following CIR.   PRECAUTIONS: Other: sickle cell, seizures, no driving, no work  WEIGHT BEARING RESTRICTIONS No  PAIN:  Are you having pain? Yes: NPRS scale: 4/10- pt reports MD prescribed muscle relaxers and she is feeling better. Pain location: left right shoulder, Pain description: aching Aggravating factors: movement Relieving factors: Not moving it , heat  FALLS: Has patient fallen in last 6 months? No,   LIVING ENVIRONMENT: Lives with: alone Lives in: House/apartment Stairs: Yes: External: 3 steps; none Has following equipment at home: Shower bench  PLOF: Independent, head chef at [employer]   PATIENT GOALS decrease shoulder pain    Treatment: . Environmental scanning in mod distracting environment, with 100% accuracy Seated low range shoulder flexion, circumduction, abduction  along tabletop  with LUE for tableslides a  min v.c for shoulder positioning.  Low to mid range functional reaching to place graded clothespins on vertical antennae, min v.c for positioning, black clothespins were too difficult(8# pinch) Constant therapy map skills level 2 with 75% accuracy, min v.c Pt completed a 12 piece puzzle without assist in a resonable amount of time      SHORT TERM GOALS: Target date: 01/21/2022  I with initial HEP  Goal status: ongoing,  not performing consistently due to pain  2.  Pt will demonstrate ability to retrieve a lightweight object at 90* with no more than min compensations and pain no greater than 3/10.Marland Kitchen Baseline: 85* Goal status: not met pain 4/10 at rest  3.  Pt will perform basic home management with supervision demonstrating good safety awareness. Baseline:dependent Goal status: met washing ,sweeping and mopping   4.  Pt will perform basic cooking with no more than min v.c/ min A demonstrating good safety awareness. 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  Pt will perform a  a simple functional organization task such as generating a menu and grocery  list with no more than min v.c  Goal status: Goal met  6.  Pt will increase LUE grip strength by 5 lbs for increased LUE functional use. Baseline: 44.7 lbs Goal status: ongoing- less due to wrist pain 28.6   7. Pt will perform all basic ADLS modified independently-ongoing,  supervision for shower, mod I for remainder of ADLS  LONG TERM GOALS: Target date:01/07/22  I with updated HEP  Goal status: ongoing  2.  Pt will perform mod complex home management mod I   Goal status: met, mopping, sweeping, cleaning BR,   3.  Pt will perform mod complex cooking modified independently demonstrating good safety awareness.  Goal status:   met per pt report  4.  Pt will perform simulated work activities mod I  Goal status:  deferred due to seizure precautions  5.  Pt will retrieve a light weight object at 110* shoulder flexion with pain no greater than 3/10.  Goal status:  ongoing, not met due to pain  6. Pt will demonstrate ability to perform a physical and cognitive task simultaneously with 90%of better accuracy. Goal status: initial   8. Pt will perform tabletop and environmental scanning tasks with 90% or better accuracy. Goal status:  ongoing  ASSESSMENT:  CLINICAL IMPRESSION: CLINICAL IMPRESSION: Pt is progressing towards goals, she demonstrates excellent progress towards cognitive and visual goals. Pt remains limited due to shoulder pain that does not appear to be improving. OT will place therapy on hold until after pt sees PMR MD and has MRI. PRECAUTIONS: Other: sickle cell  PERFORMANCE DEFICITS in functional skills including ADLs, IADLs, coordination, dexterity, ROM, strength, pain, flexibility, FMC, GMC, mobility, balance, decreased knowledge of precautions, decreased knowledge of use of DME, and UE functional use, cognitive skills including  attention, energy/drive, learn, memory, problem solving, safety awareness, sequencing, thought, and understand, and psychosocial skills including coping strategies and routines and behaviors.   IMPAIRMENTS are limiting patient from ADLs, IADLs, rest and sleep, work, play, leisure, and social participation.   COMORBIDITIES may have co-morbidities  that affects occupational performance. Patient will benefit from skilled OT to address above impairments and improve overall function.  MODIFICATION OR ASSISTANCE TO COMPLETE EVALUATION: No modification of tasks or assist necessary to complete an evaluation.  OT OCCUPATIONAL PROFILE AND HISTORY: Detailed assessment: Review of records and additional review of physical, cognitive, psychosocial history related to current functional performance.  CLINICAL DECISION MAKING: LOW - limited treatment options, no task modification necessary  REHAB POTENTIAL: Good  EVALUATION COMPLEXITY: Moderate    PLAN: OT FREQUENCY: 2x/week  OT DURATION: 12 weeks plus eval- (POC written for 12 weeks to account for scheduling, anticipate d/c after 8 weeks dependent on progress.)  PLANNED INTERVENTIONS: self care/ADL training, therapeutic exercise, therapeutic activity, neuromuscular re-education, manual therapy, passive range of motion, balance training, functional mobility training, aquatic therapy, electrical stimulation,  ultrasound, paraffin, fluidotherapy, moist heat, cryotherapy, patient/family education, cognitive remediation/compensation, visual/perceptual remediation/compensation, energy conservation, coping strategies training, and DME and/or AE instructions  RECOMMENDED OTHER SERVICES: PT, ST  CONSULTED AND AGREED WITH PLAN OF CARE: Patient  PLAN FOR NEXT SESSION: , monitor pain, progress  HEP, environmental scanning  PRECAUTIONS: Other: sickle cell  PERFORMANCE DEFICITS in functional skills including ADLs, IADLs, coordination, dexterity, ROM, strength,  pain, flexibility, FMC, GMC, mobility, balance, decreased knowledge of precautions, decreased knowledge of use of DME, and UE functional use, cognitive skills including attention, energy/drive, learn, memory, problem solving, safety awareness, sequencing, thought, and understand, and psychosocial skills including coping strategies and routines and behaviors.   IMPAIRMENTS are limiting patient from ADLs, IADLs, rest and sleep, work, play, leisure, and social participation.   COMORBIDITIES may have co-morbidities  that affects occupational performance. Patient will benefit from skilled OT to address above impairments and improve overall function.  MODIFICATION OR ASSISTANCE TO COMPLETE EVALUATION: No modification of tasks or assist necessary to complete an evaluation.  OT OCCUPATIONAL PROFILE AND HISTORY: Detailed assessment: Review of records and additional review of physical, cognitive, psychosocial history related to current functional performance.  CLINICAL DECISION MAKING: LOW - limited treatment options, no task modification necessary  REHAB POTENTIAL: Good  EVALUATION COMPLEXITY: Moderate    PLAN: OT FREQUENCY: 2x/week  OT DURATION: 12 weeks plus eval- (POC written for 12 weeks to account for scheduling, anticipate d/c after 8 weeks dependent on progress.)  PLANNED INTERVENTIONS: self care/ADL training, therapeutic exercise, therapeutic activity, neuromuscular re-education, manual therapy, passive range of motion, balance training, functional mobility training, aquatic therapy, electrical stimulation, ultrasound, paraffin, fluidotherapy, moist heat, cryotherapy, patient/family education, cognitive remediation/compensation, visual/perceptual remediation/compensation, energy conservation, coping strategies training, and DME and/or AE instructions  RECOMMENDED OTHER SERVICES: PT, ST  CONSULTED AND AGREED WITH PLAN OF CARE: Patient  PLAN FOR NEXT SESSION: Place pt on hold until after  she sees PMR, and has MRI  Haroun Cotham, OT 12/24/2021, 11:12 AM Theone Murdoch, OTR/L [phone] Phone: [phone] 11:12 AM 12/24/21

## 2022-01-08 NOTE — Patient Instructions (Signed)
We are placing OT on hold until after MRI and your appointment with the rehab MD on 01/29/22

## 2022-01-11 ENCOUNTER — Ambulatory Visit: Payer: 59 | Admitting: Occupational Therapy

## 2022-01-15 ENCOUNTER — Encounter: Payer: 59 | Admitting: Occupational Therapy

## 2022-01-17 ENCOUNTER — Encounter: Payer: 59 | Admitting: Occupational Therapy

## 2022-01-21 ENCOUNTER — Telehealth: Payer: Self-pay | Admitting: Neurology

## 2022-01-21 NOTE — Telephone Encounter (Signed)
Pt would like a call back to discuss FMLA paperwork. Received call from [employer] that they have not received the paperwork. If not received by 01/23/22,Wednesday they will start the process to terminate.

## 2022-01-21 NOTE — Telephone Encounter (Signed)
I called pt April Reilly not able to reach pt.

## 2022-01-21 NOTE — Telephone Encounter (Signed)
I re faxed [employer] form and office notes on 0703/23

## 2022-01-23 NOTE — Telephone Encounter (Signed)
Pt is calling and stated that FMLA  paperwork has not been received. And Pt is requesting a nurse give her a call to follow-up on this matter.

## 2022-01-29 ENCOUNTER — Encounter: Payer: 59 | Admitting: Physical Medicine and Rehabilitation

## 2022-01-30 ENCOUNTER — Other Ambulatory Visit (HOSPITAL_COMMUNITY): Payer: Self-pay

## 2022-01-30 ENCOUNTER — Ambulatory Visit: Payer: 59 | Admitting: Occupational Therapy

## 2022-01-30 ENCOUNTER — Telehealth: Payer: Self-pay | Admitting: Neurology

## 2022-01-30 MED ORDER — APIXABAN 5 MG PO TABS
5.0000 mg | ORAL_TABLET | Freq: Two times a day (BID) | ORAL | 2 refills | Status: DC
  Filled 2022-01-30: qty 60, 30d supply, fill #0
  Filled 2022-02-10 – 2022-03-07 (×4): qty 60, 30d supply, fill #1
  Filled 2022-03-08 (×3): qty 8, 4d supply, fill #1

## 2022-01-30 MED ORDER — APIXABAN 5 MG PO TABS
5.0000 mg | ORAL_TABLET | Freq: Two times a day (BID) | ORAL | 2 refills | Status: DC
  Filled 2022-01-30: qty 60, 30d supply, fill #0

## 2022-01-30 NOTE — Telephone Encounter (Signed)
I reviewed the chart, in the visit from 12/24/2021 Dr. Ileana Ladd had recommended the CT Venogram be repeated in 2 months. MRI was not discussed at the time of the visit.   Will fwd to MD to review and see if this is appropriate.

## 2022-01-30 NOTE — Telephone Encounter (Signed)
We need a CT venogram to be completed the first week of August. She is a very hard stick, so CT should be done at [facility] long radiology. They can use a [place] to obtain a line . Last time we had issues at GI and patient had to go to [facility].

## 2022-01-30 NOTE — Telephone Encounter (Signed)
Refill request has been sent.

## 2022-01-30 NOTE — Telephone Encounter (Signed)
Pt came in to request a MRI be scheduled referred by Dr. April Manson.  Her family Doctor is in [place] and requested her to ask Dr. April Manson for the referral.

## 2022-01-30 NOTE — Telephone Encounter (Signed)
Received this message from the transitional care service. Good afternoon,  We received a couple prescriptions for this patient at our [facility] pharmacy, however, we are a meds to bed pharmacy and we only fill for patients that are discharging from the hospital. Can you please send these to the patients preferred outpatient pharmacy? Thank you!  I have resubmitted for the pt.

## 2022-01-30 NOTE — Telephone Encounter (Signed)
Received progress report from [facility], will give to medical records

## 2022-01-30 NOTE — Telephone Encounter (Signed)
Called patient regarding form fee, she stated [place] sent paperwork to her previous neurologist to complete and they do not charge, so she wishes for just them to fill out paperwork. I advised her to call the [facility] as they are requesting a time period where she was seen in our office. She also advised that she already paid a form free for previous documentation completed for the [facility].  Bringing to POD 2 to see if form fee can be waived.

## 2022-01-30 NOTE — Telephone Encounter (Signed)
Pt requesting refill of apixaban (ELIQUIS) 5 MG TABS tablet at [facility].

## 2022-01-30 NOTE — Addendum Note (Signed)
Addended by: Verlin Grills on: 01/30/2022 02:40 PM   Modules accepted: Orders

## 2022-02-05 ENCOUNTER — Ambulatory Visit: Payer: 59 | Admitting: Physical Medicine and Rehabilitation

## 2022-02-07 ENCOUNTER — Other Ambulatory Visit (HOSPITAL_COMMUNITY): Payer: Self-pay

## 2022-02-08 ENCOUNTER — Other Ambulatory Visit (HOSPITAL_COMMUNITY): Payer: Self-pay

## 2022-02-10 ENCOUNTER — Other Ambulatory Visit: Payer: Self-pay | Admitting: Neurology

## 2022-02-11 ENCOUNTER — Other Ambulatory Visit (HOSPITAL_COMMUNITY): Payer: Self-pay

## 2022-02-12 ENCOUNTER — Other Ambulatory Visit (HOSPITAL_COMMUNITY): Payer: Self-pay

## 2022-02-12 MED ORDER — METOPROLOL TARTRATE 25 MG PO TABS
12.5000 mg | ORAL_TABLET | Freq: Two times a day (BID) | ORAL | 0 refills | Status: DC
Start: 2022-02-12 — End: 2022-05-07
  Filled 2022-02-12: qty 30, 30d supply, fill #0

## 2022-02-12 NOTE — Telephone Encounter (Signed)
Metoprolol refilled as per last office visit note.

## 2022-02-19 ENCOUNTER — Other Ambulatory Visit (HOSPITAL_COMMUNITY): Payer: Self-pay

## 2022-02-19 ENCOUNTER — Other Ambulatory Visit: Payer: Self-pay | Admitting: Neurology

## 2022-02-20 ENCOUNTER — Other Ambulatory Visit (HOSPITAL_COMMUNITY): Payer: Self-pay

## 2022-02-20 ENCOUNTER — Other Ambulatory Visit: Payer: Self-pay | Admitting: Neurology

## 2022-02-20 MED ORDER — PAROXETINE HCL 10 MG PO TABS
10.0000 mg | ORAL_TABLET | Freq: Every day | ORAL | 0 refills | Status: DC
  Filled 2022-02-20: qty 30, 30d supply, fill #0

## 2022-02-20 NOTE — Telephone Encounter (Signed)
Rx refilled as per last office visit note. 

## 2022-02-22 ENCOUNTER — Other Ambulatory Visit (HOSPITAL_COMMUNITY): Payer: Self-pay

## 2022-02-25 ENCOUNTER — Other Ambulatory Visit (HOSPITAL_COMMUNITY): Payer: Self-pay

## 2022-02-25 ENCOUNTER — Telehealth: Payer: Self-pay | Admitting: Neurology

## 2022-02-25 NOTE — Telephone Encounter (Signed)
GI tried to call her in June

## 2022-02-25 NOTE — Telephone Encounter (Signed)
Pt said headaches are getting more intense. Concerned blood clot in the brain might be growing or moving. Have not received a call to schedule CT scan of the brain. Would like a call from the nurse.

## 2022-02-28 ENCOUNTER — Ambulatory Visit (HOSPITAL_COMMUNITY): Payer: 59

## 2022-02-28 ENCOUNTER — Other Ambulatory Visit (HOSPITAL_COMMUNITY): Payer: Self-pay

## 2022-03-07 ENCOUNTER — Other Ambulatory Visit (HOSPITAL_COMMUNITY): Payer: Self-pay

## 2022-03-08 ENCOUNTER — Other Ambulatory Visit (HOSPITAL_COMMUNITY): Payer: Self-pay

## 2022-03-08 ENCOUNTER — Telehealth: Payer: Self-pay | Admitting: *Deleted

## 2022-03-08 NOTE — Telephone Encounter (Signed)
At approximately 3 pm this RN returned a VM stating " I would like to speak with Dr Chryl Heck about my Eliquis".  Per return call pt states she went to refill the medication yesterday and was told her insurance card was cancelled - she would have to pay $600 for a 15 day supply.  She states she is waiting on her disability and she is currently uninsured.  Of note the Eliquis was originally ordered remotely but then she lost her insurance and at some point obtained the 1 time free coupon for most recent fill.  She states the [facility] pharmacy informed her to ask the prescribing MD if they have samples.  She spoke with Dr April Manson - her neurologist- but he did not have samples as well as he states he refilled the medication previously as a Manufacturing engineer. He deferred her to this office.  She states she tood her last dose yesterday and " have been up all night worrying about this."  When pt was seen by Dr Chryl Heck for coagulation evaluation- she states pt should continue on the Eliquis and continue to obtain refills thru her primary MD. Dr April Manson is listed on the patient's demographic page as her primary.  This RN attempted to contact Dr Jabier Mutton with his office closed Friday afternoon.  This RN spoke with [facility] pharmacy- and [facility] with no resources available. This RN attempted to call and chat Social workers as well as pt assistance - per discussion with pt assistance given request to ask [facility] to " do an [place] billing" which allows the prescription to be filled without cost and then pt will be billed. Note pt may be able to obtain resources for payment thru the [facility] but need to discuss with SW.  This RN spoke to [place] and requested a 4 day supply of the Eliquis thru [place] billing- cost of $84.  This RN spoke to the pt per above as well as goal is to contact her on Monday for follow up on how she can obtain assistance for complete refill. Pt stated " my daughter is taking me right now to pick it   up".  Of note pt is due for visit with Dr Chryl Heck later this month and may be eligible to discontinue anti coag therapy.  This note will be sent to Social Workers for follow up on Monday on appropriate resources.  The above took approximately 70 minutes to complete.

## 2022-03-11 ENCOUNTER — Other Ambulatory Visit (HOSPITAL_COMMUNITY): Payer: Self-pay

## 2022-03-11 ENCOUNTER — Other Ambulatory Visit: Payer: Self-pay | Admitting: *Deleted

## 2022-03-11 ENCOUNTER — Ambulatory Visit (HOSPITAL_COMMUNITY)
Admission: RE | Admit: 2022-03-11 | Discharge: 2022-03-11 | Disposition: A | Discharge status: Home / Self Care | Payer: Self-pay | Source: Ambulatory Visit | Attending: Neurology | Admitting: Neurology

## 2022-03-11 ENCOUNTER — Telehealth: Payer: Self-pay | Admitting: *Deleted

## 2022-03-11 DIAGNOSIS — G08 Intracranial and intraspinal phlebitis and thrombophlebitis: Secondary | ICD-10-CM | POA: Insufficient documentation

## 2022-03-11 MED ORDER — IOHEXOL 350 MG/ML SOLN
75.0000 mL | Freq: Once | INTRAVENOUS | Status: AC | PRN
  Administered 2022-03-11: 75 mL via INTRAVENOUS

## 2022-03-11 MED ORDER — RIVAROXABAN 20 MG PO TABS
20.0000 mg | ORAL_TABLET | Freq: Every day | ORAL | 3 refills | Status: DC
  Filled 2022-03-11: qty 30, 30d supply, fill #0

## 2022-03-11 NOTE — Telephone Encounter (Signed)
Spoke with RN. Pt does not qualify for [employer] program based on diagnosis. CSW discussed assistance through manufacturer [employer]) which would require application. Will notify RN if other resources are identified.

## 2022-03-11 NOTE — Telephone Encounter (Signed)
Per unable to find financial resources for this pt without insurance and urgent need for anticoagulin therapy MD gave prescription for xeralto due to availibility for free dispense for 30 days that would then allow for application for further drug assistance.  Note pt has been on Eliquis - but was unaware of need to apply for assistance post use of 1st month free card.  Pt is now out of Eliquis and needs to continue anti coagulation therapy.  She is scheduled to see MD on 8/31.   She will need additional labs for evaluation of need for continued anticoagulation therapy.  This RN attempted to call pt and obtained her identified VM- message left to return call to this RN to discuss obtaining medication.

## 2022-03-12 ENCOUNTER — Encounter: Payer: Self-pay | Admitting: Hematology and Oncology

## 2022-03-12 ENCOUNTER — Telehealth: Payer: Self-pay | Admitting: *Deleted

## 2022-03-12 ENCOUNTER — Other Ambulatory Visit: Payer: Self-pay | Admitting: *Deleted

## 2022-03-12 ENCOUNTER — Telehealth: Payer: Self-pay | Admitting: Neurology

## 2022-03-12 ENCOUNTER — Other Ambulatory Visit: Payer: Self-pay | Admitting: Hematology and Oncology

## 2022-03-12 ENCOUNTER — Encounter: Payer: Self-pay | Admitting: Neurology

## 2022-03-12 ENCOUNTER — Ambulatory Visit (INDEPENDENT_AMBULATORY_CARE_PROVIDER_SITE_OTHER): Payer: Self-pay | Admitting: Neurology

## 2022-03-12 VITALS — BP 126/89 | HR 95 | Ht 59.0 in | Wt 146.0 lb

## 2022-03-12 DIAGNOSIS — R569 Unspecified convulsions: Secondary | ICD-10-CM

## 2022-03-12 DIAGNOSIS — G43009 Migraine without aura, not intractable, without status migrainosus: Secondary | ICD-10-CM

## 2022-03-12 DIAGNOSIS — G08 Intracranial and intraspinal phlebitis and thrombophlebitis: Secondary | ICD-10-CM

## 2022-03-12 MED ORDER — TOPIRAMATE 100 MG PO TABS: 100.0000 mg | ORAL_TABLET | Freq: Every evening | ORAL | 0 refills | Status: DC

## 2022-03-12 MED ORDER — CYCLOBENZAPRINE HCL 10 MG PO TABS: 10.0000 mg | ORAL_TABLET | Freq: Three times a day (TID) | ORAL | 0 refills | Status: DC | PRN

## 2022-03-12 NOTE — Progress Notes (Signed)
Called patient to introduce myself as Arboriculturist and to offer available resources.  Advised patient of the [place] patient Estate manager/land agent to RN for provider and patient to complete. She verbalized understanding.  Also, asked if she has applied for Medicaid which is required before she can proceed with applying for further assistance such as [facility]. Patient states she has applied for Medicaid and waiting to speak with a worker.   Advised my card will be provided to her at next visit for any additional financial questions or concerns. She verbalized understanding.

## 2022-03-12 NOTE — Patient Instructions (Addendum)
Continue current medications  I will contact you once we received the formal read of the CT Venogram Follow up with ophthalmology for visual field testing  Recommend DMV for formal driving test due to visual field cut  Will increase Topamax to 100 mg nightly and refill her Flexeril  Due to size of her stroke, I will continue her Keppra for a year, if not seizures then we will discontinue it.  Follow up in 3 months or sooner if worse

## 2022-03-12 NOTE — Telephone Encounter (Signed)
I have completed the request for Driver Re-Examination and placed on MD's desk for review and signature.

## 2022-03-12 NOTE — Progress Notes (Signed)
[facility]  PATIENT: April Reilly DOB: June 30, 1971  REQUESTING CLINICIAN: No ref. provider found HISTORY FROM: Patient/daughter  REASON FOR VISIT: Venous thrombosis    HISTORICAL  CHIEF COMPLAINT:  Chief Complaint  Patient presents with   Follow-up    Rm 12 here for 11 week f/u. Pt reports h/a, neck, and should pain remain the same. Pt has lost FMLA and insurance.    INTERVAL HISTORY 03/12/2022:  Patient presents today for follow-up, last visit was in June at that time plan was to continue with Eliquis and obtain a repeat CT venogram.  She reported she ran out of Eliquis and actually her insurance dropped her because of FMLA.  Likely she did follow-up with Dr. Chryl Heck, hematologist and patient was started on Xarelto.  She did have her CT venogram yesterday but pending official read.  She reports that she still having occasional headaches that is relieved by muscle relaxant, her vision is still poor and she is pending a ophthalmology follow-up next week.  Denies any recent fall.  Denies any new focal weakness.     INTERVAL HISTORY 12/24/21:  Patient presents today for follow-up, her CT venogram was repeated and it showed residual nonocclusive thrombus involving the distal right transverse and sigmoid sinus sinuses.,  Persistent but somewhat improved in appearance as compared to previous exam.  No new dural sinus thrombosis.  She remains on Eliquis 5 mg twice daily.  She did follow-up with primary care doctor and hematology.  She is pending a complete hematology work-up.  Currently she is complaining of left shoulder arm pain, some migraine headaches.  She ran out of some of her medications more than a month ago and have not refill it.    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  She was initiated on anticoagulation.  Neurosurgery was consulted but patient did not have a craniectomy.  She developed seizures, started on Keppra  Her symptoms improve, she was discharge to acute rehab and her plan was to continue with anticoagulation for at least 6 months.  Patient report prior to her hospitalization she has been doing well, she has chronic anemia, sickle cell disease but she was not taken on any medication.  She reports a few weeks prior to presentation she developed vaginal bleeding and she was started on megestrol acetate.  Since discharge from the hospital, daughter reported improvement of her mental status, confusion improved, she does continue with physical therapy and she is getting more independent.  She is compliant with all medications.  Patient reports prior to her hospitalization she was not taking any medication but since discharge she is on 13 medications, she feels like she does not need all of them.   Hospital course below. April Reilly is a 51 y.o. female with history of SS trait, uterine fibroid, abnormal vaginal bleeding x4 months who was admitted on 09/10/2021 with 3-day history of malaise, fall and progressive somnolence.  She was found to have RLL aspiration pneumonia as well as IPH due to right hemorrhagic infarct with edema  and right frontal, parietal, occipital and temporal lobes and 9 mm right to left midline shift.  She was started on hypertonic saline and EEG done showing evidence of epileptogenicity with high potential for seizures.  Work-up also revealed subocclusive clot in right distal right transverse sinus and right sigmoid dural venous sinus and she was started on IV heparin.  Dr.  Erlinda Hong felt that patient with large right frontoparietal venous infarct with hemorrhagic conversion due to unclear etiology.  Neurosurgery was consulted due to concerns about the herniation and recommended conservative care with serial CCT for monitoring. She was loaded with Dilantin due to concerns of seizure and placed on long-term EEG.  Dilantin was discontinued and Keppra added on 02/26.  Hypertonic saline was weaned off and mentation was slowly improving.  Repeat CTA/V of 02/27 showed evolving hemorrhagic infarct with mild improvement in mass effect and improved nonocclusive thrombus in right distal transverse and sigmoid sinus.  She was transition to [place] on 02/28 with neurology recommending 3 to 6 months of AC as well as close neurological follow-up.  Hospital course was significant for issues with headaches, hypotension, tachycardia, left-sided weakness with balance deficits as well as cognitive deficits and disorientation.  CIR was recommended due to functional decline.     OTHER MEDICAL CONDITIONS: SS Trait, anemia    REVIEW OF SYSTEMS: Full 14 system review of systems performed and negative with exception of: as noted in the HPI   ALLERGIES: Allergies  Allergen Reactions   Megestrol Other (See Comments) and Swelling    Caused blood clot in brain Caused blood clot in brain    HOME MEDICATIONS: Outpatient Medications Prior to Visit  Medication Sig Dispense Refill   ACETAMINOPHEN-BUTALBITAL 50-325 MG TABS Take 1 tablet by mouth 2 (two) times daily as needed. 60 tablet 0   atorvastatin (LIPITOR) 40 MG tablet Take by mouth.     diclofenac Sodium (VOLTAREN) 1 % GEL Place onto the skin.     levETIRAcetam (KEPPRA) 500 MG tablet Take 1 tablet (500 mg total) by mouth 2 (two) times daily. 180 tablet 3   levETIRAcetam (KEPPRA) 500 MG tablet Take by mouth.     metoprolol tartrate (LOPRESSOR) 25 MG tablet Take 0.5 tablets (12.5 mg total) by mouth 2 (two) times daily. 30 tablet 0   PARoxetine  (PAXIL) 10 MG tablet Take 1 tablet (10 mg total) by mouth daily. 30 tablet 0   rivaroxaban (XARELTO) 20 MG TABS tablet Take 1 tablet (20 mg total) by mouth daily with supper. 30 tablet 3   zaleplon (SONATA) 5 MG capsule Take by mouth.     cyclobenzaprine (FLEXERIL) 10 MG tablet Take 1 tablet (10 mg total) by mouth 3 (three) times daily as needed for muscle spasms. 90 tablet 0   topiramate (TOPAMAX) 50 MG tablet Take 1 tablet (50 mg total) by mouth at bedtime. 30 tablet 3   No facility-administered medications prior to visit.    PAST MEDICAL HISTORY: Past Medical History:  Diagnosis Date   Asthma    Vaginal bleeding, abnormal    for 4 months    PAST SURGICAL HISTORY: Past Surgical History:  Procedure Laterality Date   FOOT SURGERY     TUBAL LIGATION      FAMILY HISTORY: Family History  Problem Relation Age of Onset   Clotting disorder Mother     SOCIAL HISTORY: Social History   Socioeconomic History   Marital status: Single    Spouse name: Not on file   Number  of children: Not on file   Years of education: Not on file   Highest education level: Not on file  Occupational History   Not on file  Tobacco Use   Smoking status: Never   Smokeless tobacco: Never  Substance and Sexual Activity   Alcohol use: Yes   Drug use: Never   Sexual activity: Yes  Other Topics Concern   Not on file  Social History Narrative   Not on file   Social Determinants of Health   Financial Resource Strain: Not on file  Food Insecurity: Not on file  Transportation Needs: Not on file  Physical Activity: Not on file  Stress: Not on file  Social Connections: Not on file  Intimate Partner Violence: Not on file    PHYSICAL EXAM  GENERAL EXAM/CONSTITUTIONAL: Vitals:  Vitals:   03/12/22 1144  BP: 126/89  Pulse: 95  Weight: 146 lb (66.2 kg)  Height: '4\' 11"'$  (1.499 m)   Body mass index is 29.49 kg/m. Wt Readings from Last 3 Encounters:  03/12/22 146 lb (66.2 kg)  12/24/21 138 lb  (62.6 kg)  12/20/21 139 lb 4.8 oz (63.2 kg)   Patient is in no distress; well developed, nourished and groomed; neck is supple  EYES: Pupils round and reactive to light, there is left visual field cut, Extraocular movements intacts,   MUSCULOSKELETAL: Gait, strength, tone, movements noted in Neurologic exam below  NEUROLOGIC: MENTAL STATUS:      No data to display         awake, alert, oriented to person, place and time recent and remote memory intact normal attention and concentration language fluent, comprehension intact, naming intact fund of knowledge appropriate  CRANIAL NERVE:  2nd, 3rd, 4th, 6th - pupils equal and reactive to light, Left visual field cut, extraocular muscles intact, no nystagmus 5th - facial sensation symmetric 7th - facial strength symmetric 8th - hearing intact 9th - palate elevates symmetrically, uvula midline 11th - shoulder shrug symmetric 12th - tongue protrusion midline  MOTOR:  normal bulk and tone, full strength in the BUE, BLE  SENSORY:  normal and symmetric to light touch, pinprick, temperature, vibration  COORDINATION:  finger-nose-finger, fine finger movements normal  REFLEXES:  deep tendon reflexes present and symmetric  GAIT/STATION:  normal   DIAGNOSTIC DATA (LABS, IMAGING, TESTING) - I reviewed patient records, labs, notes, testing and imaging myself where available.  Lab Results  Component Value Date   WBC 4.2 10/19/2021   HGB 12.5 10/19/2021   HCT 39.8 10/19/2021   MCV 78.7 (L) 10/19/2021   PLT 253 10/19/2021      Component Value Date/Time   NA 137 10/19/2021 1250   K 3.9 10/19/2021 1250   CL 106 10/19/2021 1250   CO2 23 10/19/2021 1250   GLUCOSE 92 10/19/2021 1250   BUN <5 (L) 10/19/2021 1250   CREATININE 0.76 10/19/2021 1250   CALCIUM 9.3 10/19/2021 1250   PROT 6.8 09/20/2021 0544   ALBUMIN 3.0 (L) 09/20/2021 0544   AST 53 (H) 09/20/2021 0544   ALT 80 (H) 09/20/2021 0544   ALKPHOS 83 09/20/2021  0544   BILITOT 0.3 09/20/2021 0544   GFRNONAA >60 10/19/2021 1250   Lab Results  Component Value Date   CHOL 139 09/11/2021   HDL 46 09/11/2021   LDLCALC 84 09/11/2021   TRIG 47 09/11/2021   CHOLHDL 3.0 09/11/2021   Lab Results  Component Value Date   HGBA1C 5.3 09/11/2021   Lab Results  Component Value  Date   DVVOHYWV37 106 09/11/2021   Lab Results  Component Value Date   TSH 0.164 (L) 09/11/2021    CT Venogram 11/28/2021 1. 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 Previously seen blood products have resolved. 2. Residual nonocclusive thrombus involving the distal right transverse and sigmoid sinuses, persistent but somewhat improved in appearance as compared to previous exam. No new dural sinus thrombosis. 3. No other new acute intracranial abnormality.  CT Venogram 09/17/21. Evolving hemorrhagic infarction primarily involving temporal and occipital lobes with extension into the parietal lobe. No new hemorrhage. Mass effect is stable to mildly improved. Persistent but improved nonocclusive thrombus within the right distal transverse and sigmoid sinuses   Head CT 09/10/21 Large region of cortical/subcortical edema within the right frontal, parietal, occipital and temporal lobes, as well as right insula and subinsular region. Superimposed patchy foci of acute parenchymal hemorrhage within the right parietal, occipital and temporal lobes. These findings are favored to reflect an acute/early subacute infarct from arterial ischemia (with hemorrhagic conversion) or a hemorrhagic infarct due to intracranial venous thrombosis. An MRI of the brain with contrast (including thin-slice [id number] post-contrast imaging for assessment of the intracranial venous structures) is recommended for further evaluation. A CTA of the head/neck should also be considered.   Associated mass effect with significant  partial effacement of the right lateral ventricle and 9 mm leftward midline shift. The basal cisterns are effaced. Neurosurgical consultation is advised.   Moderate-volume extension of hemorrhage into the right lateral ventricle is questioned. No definite evidence of hydrocephalus or ventricular entrapment at this time   MRI Brain 09/10/21 1. Large area of cortical infarction in the right temporal, occipital, parietal, and posterior frontal lobes, with significant associated edema and redemonstrated intraparenchymal hemorrhage. The mass effect causes 11 mm of right-to-left midline shift, medial displacement of the right uncus and right temporal horn and effacement of the basal cisterns, unchanged from the prior CT and raising concern for right uncal herniation. 2. Additional focal area of restricted diffusion in the medial aspect of the right basal ganglia, suspected to be in the preoptic area of the hypothalamus, likely sequela of aforementioned mass effect. 3. Hemorrhage layering in the occipital horns of the lateral ventricles, with redemonstrated effacement of the right lateral and third ventricle. No evidence of hydrocephalus. 4. Small amount of subdural hemorrhage along the right frontal lobe, measuring up to 4 mm. 5. Filling defect in the distal right transverse sinus and proximal and mid right sigmoid sinus, consistent with thrombus, as seen on the same-day CTV.   LTM EEG 09/11/21 - Sharp waves,  right hemisphere, maximal right parieto-occipital region  - Continuous slow, generalized and lateralized right hemisphere     ASSESSMENT AND PLAN  51 y.o. year old female with medical history of sickle cell, chronic anemia here for follow up for large right hemispheric intracerebral hemorrhage due to cerebral venous sinus thrombosis.  Patient did have subsequent seizures and brain edema.  She has been anticoagulated initially on heparin and now on DOAC.  Currently on Xarelto. It has been 6 months  seizure free, so technically she can resume driving but due to her ongoing visual field deficit, I do recommend her to follow up with DMV for formal driving evaluation.  I will contact patient once I received official read from CT venogram. Due to size of her stroke, I will continue her on Keppra for a total of a year. For her headaches,  I will increase Topamax to 100 mg nightly, and give her a refill for Flexeril.  I will see her for follow up or sooner if worse. She voices understanding.     1. Cerebral venous sinus thrombosis   2. Seizure ([place])   3. Migraine without aura and without status migrainosus, not intractable      Patient Instructions  Continue current medications  I will contact you once we received the formal read of the CT Venogram Follow up with ophthalmology for visual field testing  Recommend DMV for formal driving test due to visual field cut  Will increase Topamax to 100 mg nightly and refill her Flexeril  Due to size of her stroke, I will continue her Keppra for a year, if not seizures then we will discontinue it.  Follow up in 3 months or sooner if worse   No orders of the defined types were placed in this encounter.   Meds ordered this encounter  Medications   cyclobenzaprine (FLEXERIL) 10 MG tablet    Sig: Take 1 tablet (10 mg total) by mouth 3 (three) times daily as needed for muscle spasms.    Dispense:  30 tablet    Refill:  0   topiramate (TOPAMAX) 100 MG tablet    Sig: Take 1 tablet (100 mg total) by mouth at bedtime.    Dispense:  60 tablet    Refill:  0    Return in about 3 months (around 06/12/2022).    Alric Ran, MD 03/12/2022, 1:00 PM  [facility] [address] [place], [place] [address] [phone]

## 2022-03-12 NOTE — Telephone Encounter (Signed)
Pt states Dr. April Manson has cleared her to drive during her visit 03/12/2022. Pt would like to know if she needs a letter sent to the [facility] for this to happen.  Would like a call.

## 2022-03-12 NOTE — Telephone Encounter (Signed)
Error

## 2022-03-12 NOTE — Telephone Encounter (Signed)
I told her she needs a formal driving test because of visual field deficit.

## 2022-03-12 NOTE — Progress Notes (Signed)
Received email from nurse regarding patient needing assistance with anti-coag medication which was changed. Patient not eligible for [employer] grant due to diagnosis. April Reilly and April Reilly has an Risk manager for [employer] which requires form be completed by patient and provider and returned directly to [employer]. Advised RN and emailed application to her.  Will review if further resources are available as far as uninsured status and reach out to patient if any available. Social worker has already met with patient.

## 2022-03-12 NOTE — Telephone Encounter (Signed)
Pt brought in lab results that were drawn in [place] at visit with Dr Beryl Meager - note md did IDA work up but no labs received that relate to coagulation.  Per MD- need for labs to be drawn for further evaluation.  This RN attempted to contact pt- obtained identified VM- message left per need of lab prior to MD visit as well as need to pick up form for continued drug assistance for the Xarelto.  Appt request sent to scheduling.

## 2022-03-12 NOTE — Progress Notes (Signed)
Hypercoag work up ordered.  Zaheer Wageman

## 2022-03-21 ENCOUNTER — Encounter: Payer: Self-pay | Admitting: Hematology and Oncology

## 2022-03-21 ENCOUNTER — Other Ambulatory Visit: Payer: Self-pay

## 2022-03-21 ENCOUNTER — Inpatient Hospital Stay: Payer: 59 | Attending: Hematology and Oncology | Admitting: Hematology and Oncology

## 2022-03-21 VITALS — BP 133/90 | HR 112 | Temp 97.7°F | Resp 18 | Ht 59.0 in | Wt 147.3 lb

## 2022-03-21 DIAGNOSIS — Z79899 Other long term (current) drug therapy: Secondary | ICD-10-CM | POA: Insufficient documentation

## 2022-03-21 DIAGNOSIS — G08 Intracranial and intraspinal phlebitis and thrombophlebitis: Secondary | ICD-10-CM | POA: Insufficient documentation

## 2022-03-21 DIAGNOSIS — Z7901 Long term (current) use of anticoagulants: Secondary | ICD-10-CM | POA: Insufficient documentation

## 2022-03-21 DIAGNOSIS — R531 Weakness: Secondary | ICD-10-CM | POA: Insufficient documentation

## 2022-03-21 NOTE — Progress Notes (Signed)
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   Please refer to my first note for complete history.    She mentioned to [place] in the past that she had some blood work for clotting work up, but we didn't receive this labs. She later mentioned to [place] that her doctor was not willing to share the labs.  We today recommended to proceed with hypercoagulable work up. She is now on xarelto ( She lost her insurance hence couldn't continue eliquis) got first month of xarelto for free. We discussed if her hypercoagulable work up is negative, She may continue anticoagulation for a total of 6-9 months followed by aspirin alone. She understands that there is risk of recurrence of CVST. If her hypercoagulable work up indicates high risk for re clotting, she may have to continue long term anticoagulation.  All her questions were answered to the best of my knowledge.  We have previously discussed risks of anticoagulation as well as the benefits.  We have briefly reviewed her MRI results which shows persistent clot but improved clot.  She should continue to work with her physical therapist since she has ongoing weakness. Return to clinic in 3 months  HISTORY OF PRESENTING ILLNESS:   April Reilly 51 y.o. female is here because of DVT/cerebral venous sinus thrombosis.  This is a pleasant 51 year old female patient who initially went to the urgent care with abnormal vaginal bleeding started on Megace and then was admitted with 3-day history of malaise somnolence was found to have right hemorrhagic infarct, right lower lobe aspiration pneumonia.  She was also found to have subocclusive clot in the right distal transverse  sinus right sigmoid dural venous sinus.   She was on Eliquis for anticoagulation. But she lost insurance and couldn't afford Eliquis.  Hence we got her started on Xarelto which she obtained for free ( first months supply) No bleeding complaints. She however continues to have some headaches, shoulder pain on the left and blurred vision. She continues to follow up with neurology. Rest of the pertinent 10 point ROS reviewed and negative  MEDICAL HISTORY:  Past Medical History:  Diagnosis Date   Asthma    Vaginal bleeding, abnormal    for 4 months    SURGICAL HISTORY: Past Surgical History:  Procedure Laterality Date   FOOT SURGERY     TUBAL LIGATION      SOCIAL HISTORY: Social History   Socioeconomic History   Marital status: Single    Spouse name: Not on file   Number of children: Not on file   Years of education: Not on file   Highest education level: Not on file  Occupational History   Not on file  Tobacco Use   Smoking status: Never   Smokeless tobacco: Never  Substance and Sexual Activity   Alcohol use: Yes   Drug use: Never   Sexual activity: Yes  Other Topics Concern   Not on file  Social History Narrative   Not on file   Social Determinants of Health   Financial Resource Strain: Not on file  Food Insecurity: Not on file  Transportation Needs: Not on file  Physical Activity: Not on file  Stress: Not on file  Social Connections:  Not on file  Intimate Partner Violence: Not on file    FAMILY HISTORY: Family History  Problem Relation Age of Onset   Clotting disorder Mother     ALLERGIES:  is allergic to megestrol.  MEDICATIONS:  Current Outpatient Medications  Medication Sig Dispense Refill   ACETAMINOPHEN-BUTALBITAL 50-325 MG TABS Take 1 tablet by mouth 2 (two) times daily as needed. 60 tablet 0   atorvastatin (LIPITOR) 40 MG tablet Take by mouth.     cyclobenzaprine (FLEXERIL) 10 MG tablet Take 1 tablet (10 mg total) by mouth 3 (three) times daily  as needed for muscle spasms. 30 tablet 0   diclofenac Sodium (VOLTAREN) 1 % GEL Place onto the skin.     levETIRAcetam (KEPPRA) 500 MG tablet Take 1 tablet (500 mg total) by mouth 2 (two) times daily. 180 tablet 3   levETIRAcetam (KEPPRA) 500 MG tablet Take by mouth.     metoprolol tartrate (LOPRESSOR) 25 MG tablet Take 0.5 tablets (12.5 mg total) by mouth 2 (two) times daily. 30 tablet 0   PARoxetine (PAXIL) 10 MG tablet Take 1 tablet (10 mg total) by mouth daily. 30 tablet 0   rivaroxaban (XARELTO) 20 MG TABS tablet Take 1 tablet (20 mg total) by mouth daily with supper. 30 tablet 3   topiramate (TOPAMAX) 100 MG tablet Take 1 tablet (100 mg total) by mouth at bedtime. 60 tablet 0   zaleplon (SONATA) 5 MG capsule Take by mouth.     No current facility-administered medications for this visit.     PHYSICAL EXAMINATION: ECOG PERFORMANCE STATUS: 0 - Asymptomatic  Vitals:   03/21/22 1540  BP: (!) 133/90  Pulse: (!) 112  Resp: 18  Temp: 97.7 F (36.5 C)  SpO2: 99%   Filed Weights   03/21/22 1540  Weight: 147 lb 4.8 oz (66.8 kg)   Physical Exam Constitutional:      Appearance: Normal appearance.  Cardiovascular:     Rate and Rhythm: Normal rate and regular rhythm.     Pulses: Normal pulses.     Heart sounds: Normal heart sounds.  Pulmonary:     Effort: Pulmonary effort is normal.     Breath sounds: Normal breath sounds.  Musculoskeletal:     Cervical back: Normal range of motion and neck supple. No rigidity.  Lymphadenopathy:     Cervical: No cervical adenopathy.  Neurological:     Mental Status: She is alert.     Motor: Weakness (Left upper extremity weakness) present.  Psychiatric:        Mood and Affect: Mood normal.      LABORATORY DATA:  I have reviewed the data as listed Lab Results  Component Value Date   WBC 4.2 10/19/2021   HGB 12.5 10/19/2021   HCT 39.8 10/19/2021   MCV 78.7 (L) 10/19/2021   PLT 253 10/19/2021     Chemistry      Component Value  Date/Time   NA 137 10/19/2021 1250   K 3.9 10/19/2021 1250   CL 106 10/19/2021 1250   CO2 23 10/19/2021 1250   BUN <5 (L) 10/19/2021 1250   CREATININE 0.76 10/19/2021 1250      Component Value Date/Time   CALCIUM 9.3 10/19/2021 1250   ALKPHOS 83 09/20/2021 0544   AST 53 (H) 09/20/2021 0544   ALT 80 (H) 09/20/2021 0544   BILITOT 0.3 09/20/2021 0544      MRI Brain 09/10/21 1. Large area of cortical infarction in the right temporal, occipital, parietal,  and posterior frontal lobes, with significant associated edema and redemonstrated intraparenchymal hemorrhage. The mass effect causes 11 mm of right-to-left midline shift, medial displacement of the right uncus and right temporal horn and effacement of the basal cisterns, unchanged from the prior CT and raising concern for right uncal herniation. 2. Additional focal area of restricted diffusion in the medial aspect of the right basal ganglia, suspected to be in the preoptic area of the hypothalamus, likely sequela of aforementioned mass effect. 3. Hemorrhage layering in the occipital horns of the lateral ventricles, with redemonstrated effacement of the right lateral and third ventricle. No evidence of hydrocephalus. 4. Small amount of subdural hemorrhage along the right frontal lobe, measuring up to 4 mm. 5. Filling defect in the distal right transverse sinus and proximal and mid right sigmoid sinus, consistent with thrombus, as seen on the same-day CTV.    RADIOGRAPHIC STUDIES: I have personally reviewed the radiological images as listed and agreed with the findings in the report. CT VENOGRAM HEAD  Result Date: 03/13/2022 CLINICAL DATA:  Venous sinus thrombosis follow-up EXAM: CT VENOGRAM HEAD TECHNIQUE: Venographic phase images of the brain were obtained following the administration of intravenous contrast. Multiplanar reformats and maximum intensity projections were generated. RADIATION DOSE REDUCTION: This exam was performed according to  the departmental dose-optimization program which includes automated exposure control, adjustment of the mA and/or kV according to patient size and/or use of iterative reconstruction technique. CONTRAST:  1m OMNIPAQUE IOHEXOL 350 MG/ML SOLN COMPARISON:  None Available. FINDINGS: Brain: Large area of encephalomalacia involving the right temporal and occipital lobes, unchanged. No acute hemorrhage. No extra-axial collection. Vascular: There is a persistent small amount of thrombus within the right transverse and sigmoid sinuses. The other dural venous sinuses and central cerebral veins are patent. Skull: The visualized skull base, calvarium and extracranial soft tissues are normal. Sinuses/Orbits: No fluid levels or advanced mucosal thickening of the visualized paranasal sinuses. No mastoid or middle ear effusion. The orbits are normal. IMPRESSION: 1. Unchanged small amount of residual nonocclusive thrombus within the right transverse and sigmoid sinuses. 2. 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 Electronically Signed   By: KUlyses JarredM.D.   On: 03/13/2022 04:01    All questions were answered. The patient knows to call the clinic with any problems, questions or concerns. I spent 30 min in the care of this patient including H and P, review of records, counseling and coordination of care.     PBenay Pike MD 03/21/2022 3:42 PM

## 2022-03-22 ENCOUNTER — Telehealth: Payer: Self-pay | Admitting: Hematology and Oncology

## 2022-03-22 NOTE — Telephone Encounter (Signed)
Scheduled appointment per 9/1 secure chat. Patient is aware of the made appointment.

## 2022-03-27 ENCOUNTER — Other Ambulatory Visit: Payer: Self-pay

## 2022-03-27 ENCOUNTER — Inpatient Hospital Stay: Payer: Medicaid Other | Attending: Hematology and Oncology

## 2022-03-27 DIAGNOSIS — G08 Intracranial and intraspinal phlebitis and thrombophlebitis: Secondary | ICD-10-CM | POA: Insufficient documentation

## 2022-03-27 LAB — ANTITHROMBIN III: AntiThromb III Func: 97 % (ref 75–120)

## 2022-03-29 LAB — DRVVT MIX: dRVVT Mix: 82.2 s — ABNORMAL HIGH (ref 0.0–40.4)

## 2022-03-29 LAB — LUPUS ANTICOAGULANT PANEL
DRVVT: 94.4 s — ABNORMAL HIGH (ref 0.0–47.0)
PTT Lupus Anticoagulant: 50.6 s — ABNORMAL HIGH (ref 0.0–43.5)

## 2022-03-29 LAB — CARDIOLIPIN ANTIBODIES, IGG, IGM, IGA
Anticardiolipin IgA: <9 APL U/mL (ref 0–11)
Anticardiolipin IgG: <9 GPL U/mL (ref 0–14)
Anticardiolipin IgM: 10 MPL U/mL (ref 0–12)

## 2022-03-29 LAB — PROTEIN C ACTIVITY: Protein C Activity: 118 % (ref 73–180)

## 2022-03-29 LAB — PROTEIN S ACTIVITY: Protein S Activity: 108 % (ref 63–140)

## 2022-03-29 LAB — DRVVT CONFIRM: dRVVT Confirm: 1.5 ratio — ABNORMAL HIGH (ref 0.8–1.2)

## 2022-03-29 LAB — BETA-2-GLYCOPROTEIN I ABS, IGG/M/A
Beta-2 Glyco I IgG: <9 GPI IgG units (ref 0–20)
Beta-2-Glycoprotein I IgA: <9 GPI IgA units (ref 0–25)
Beta-2-Glycoprotein I IgM: <9 GPI IgM units (ref 0–32)

## 2022-03-29 LAB — HEXAGONAL PHASE PHOSPHOLIPID: Hexagonal Phase Phospholipid: 6 s (ref 0–11)

## 2022-03-29 LAB — PTT-LA MIX: PTT-LA Mix: 46.5 s — ABNORMAL HIGH (ref 0.0–40.5)

## 2022-04-02 LAB — PROTHROMBIN GENE MUTATION

## 2022-04-02 LAB — FACTOR 5 LEIDEN

## 2022-04-02 LAB — HEXAGONAL PHOSPHOLIPID NEUTRALIZATION: Hexagonal Phospholipid Neutral: 10 s

## 2022-04-09 ENCOUNTER — Telehealth: Payer: Self-pay | Admitting: Neurology

## 2022-04-16 ENCOUNTER — Telehealth: Payer: Self-pay | Admitting: Neurology

## 2022-04-16 ENCOUNTER — Other Ambulatory Visit: Payer: Self-pay | Admitting: Hematology and Oncology

## 2022-04-16 ENCOUNTER — Telehealth: Payer: Self-pay | Admitting: *Deleted

## 2022-04-16 DIAGNOSIS — G08 Intracranial and intraspinal phlebitis and thrombophlebitis: Secondary | ICD-10-CM

## 2022-04-16 NOTE — Progress Notes (Signed)
Repeat LA panel ordered to rule out false positive.   April Reilly

## 2022-04-16 NOTE — Telephone Encounter (Signed)
Pt is needing to discuss her apixaban (ELIQUIS) 5 MG TABS tablet with the provider. Pt states that her blood doctor took her off of the apixaban (ELIQUIS) 5 MG TABS tablet Due to it possibly be causing extra bleeding while she is on her menstrual. Please advise.

## 2022-04-16 NOTE — Telephone Encounter (Signed)
This RN spoke with the patient per her call stating she starting menstrual cycle yesterday and is having profound bleeding, She states she is wearing a super plus tampon with an overnight pad and needing to change approximately every hour.  She is passing clots approximately the size of walnuts.  April Reilly states her last menstrual cycle was " when all this issue ( stroke and blood clot ) started in March 2023".  Per review with MD and labs obtained post last visit - pt may stop the Xarelto at this time but will need to recheck labs in 2 week to verify some of the results that may be altered by the blood thinner.  Pt may need to follow up with GYN if bleeding does not lighten up appropriately.  This RN discussed above with pt who verbalized understanding- she does not have a local GYN since relocating to the area - and will follow up to find one.  She also verbalized understanding of need for lab recheck so we can verify values.  LOS sent to scheduling - MD placed orders.

## 2022-04-17 NOTE — Telephone Encounter (Signed)
I attempted to reach the pt, pt not available and vm not working.

## 2022-04-22 NOTE — Telephone Encounter (Signed)
Please call and inform patient NOT to restart Eliquis. She has been anticoagulated for about 8 months with improvement/stability of her sinus thrombosis.

## 2022-04-22 NOTE — Telephone Encounter (Signed)
Sent mychart message

## 2022-04-22 NOTE — Telephone Encounter (Signed)
Pt called to inform us that Dr Chryl Heck, -Hematology and Oncology,  stop her  last Wednesday, due to very heavy bleeding during menstrual cycle.  Bleeding stopped 04/20/22. She is asking if you want her to start back on ELIQUIS.

## 2022-04-23 NOTE — Telephone Encounter (Signed)
Mailbox full

## 2022-04-24 NOTE — Telephone Encounter (Signed)
Contacted pt informed her of Dr Jabier Mutton recommendations to NOT to restart Eliquis. She has been anticoagulated for about 8 months with improvement/stability of her sinus thrombosis Also mention if disability forms was completed or not because she needed to make payment. Advised forms are not process until fee is paid first and office hours are M-Thu 8-5.   April Reilly Pt verbally understood and was appreciative.

## 2022-05-07 ENCOUNTER — Ambulatory Visit (INDEPENDENT_AMBULATORY_CARE_PROVIDER_SITE_OTHER): Payer: Self-pay | Admitting: Neurology

## 2022-05-07 ENCOUNTER — Encounter: Payer: Self-pay | Admitting: Neurology

## 2022-05-07 VITALS — BP 120/83 | HR 128 | Ht 59.0 in | Wt 149.0 lb

## 2022-05-07 DIAGNOSIS — Z0289 Encounter for other administrative examinations: Secondary | ICD-10-CM

## 2022-05-07 DIAGNOSIS — R569 Unspecified convulsions: Secondary | ICD-10-CM

## 2022-05-07 DIAGNOSIS — I618 Other nontraumatic intracerebral hemorrhage: Secondary | ICD-10-CM

## 2022-05-07 DIAGNOSIS — F419 Anxiety disorder, unspecified: Secondary | ICD-10-CM

## 2022-05-07 DIAGNOSIS — G08 Intracranial and intraspinal phlebitis and thrombophlebitis: Secondary | ICD-10-CM

## 2022-05-07 DIAGNOSIS — G44229 Chronic tension-type headache, not intractable: Secondary | ICD-10-CM

## 2022-05-07 DIAGNOSIS — G936 Cerebral edema: Secondary | ICD-10-CM

## 2022-05-07 MED ORDER — CYCLOBENZAPRINE HCL 10 MG PO TABS
10.0000 mg | ORAL_TABLET | Freq: Three times a day (TID) | ORAL | 0 refills | Status: DC | PRN
Start: 2022-05-07 — End: 2022-07-24

## 2022-05-07 MED ORDER — LEVETIRACETAM 500 MG PO TABS: 500.0000 mg | ORAL_TABLET | Freq: Two times a day (BID) | ORAL | 3 refills | Status: DC

## 2022-05-07 MED ORDER — PAROXETINE HCL 10 MG PO TABS: 10.0000 mg | ORAL_TABLET | Freq: Every day | ORAL | 3 refills | Status: DC

## 2022-05-07 MED ORDER — AMITRIPTYLINE HCL 25 MG PO TABS: 25.0000 mg | ORAL_TABLET | Freq: Every day | ORAL | 3 refills | Status: DC

## 2022-05-07 NOTE — Progress Notes (Signed)
[facility]  PATIENT: April Reilly DOB: 1971/01/31  REQUESTING CLINICIAN: Alric Ran, MD HISTORY FROM: Patient REASON FOR VISIT: Venous thrombosis follow up   HISTORICAL  CHIEF COMPLAINT:  Chief Complaint  Patient presents with   Follow-up    Rm 12. Alone. C/o vision problems and daily headaches at night or in the morning upon waking.   INTERVAL HISTORY 05/07/2022:  April Reilly presents today for follow-up, she is alone.  Since last visit she had an episode of severe vaginal bleeding, her Xarelto was discontinued.  Since she has been anticoagulated for 8 months,  it was reasonable to discontinue Xarelto.  She is presenting today complaining of headache, at last visit we had increased her Topamax to 100 mg nightly but she still reports headache that usually start in the back of the neck.  She reported Flexeril helps but she ran out of medication.  She also tried Fioricet which helped her.  She denies any migrainous features, denies any nausea or vomiting.  She reported she is very anxious and worry about her brain injury, she cannot remember her seizure and worried about having another seizure.  Her vision is still affected, and wonder if she will get her vision back. She has not seen oph due to lack of insurance.    INTERVAL HISTORY 03/12/2022:  Patient presents today for follow-up, last visit was in June at that time plan was to continue with Eliquis and obtain a repeat CT venogram.  She reported she ran out of Eliquis and actually her insurance dropped her because of FMLA.  Luckily she did follow-up with Dr. Chryl Heck, hematologist and patient was started on Xarelto.  She did have her CT venogram yesterday but pending official read.  She reports that she still having occasional headaches that is relieved by muscle relaxant, her vision is still poor and she is pending a ophthalmology follow-up next week.  Denies any recent fall.  Denies any new focal weakness.      INTERVAL HISTORY 12/24/21:  Patient presents today for follow-up, her CT venogram was repeated and it showed residual nonocclusive thrombus involving the distal right transverse and sigmoid sinus sinuses.,  Persistent but somewhat improved in appearance as compared to previous exam.  No new dural sinus thrombosis.  She remains on Eliquis 5 mg twice daily.  She did follow-up with primary care doctor and hematology.  She is pending a complete hematology work-up.  Currently she is complaining of left shoulder arm pain, some migraine headaches.  She ran out of some of her medications more than a month ago and have not refill it.    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  She was initiated on anticoagulation.  Neurosurgery was consulted but patient did not have a craniectomy.  She developed seizures, started on Keppra  Her symptoms improve, she was discharge to acute rehab and her plan was to continue with anticoagulation for at least 6 months.  Patient report prior to her hospitalization she has been doing well, she has chronic anemia, sickle cell  disease but she was not taken on any medication.  She reports a few weeks prior to presentation she developed vaginal bleeding and she was started on megestrol acetate.  Since discharge from the hospital, daughter reported improvement of her mental status, confusion improved, she does continue with physical therapy and she is getting more independent.  She is compliant with all medications.  Patient reports  prior to her hospitalization she was not taking any medication but since discharge she is on 13 medications, she feels like she does not need all of them.   Hospital course below. April Reilly is a 51 y.o. female with history of SS trait, uterine fibroid, abnormal vaginal bleeding x4 months who was admitted on 09/10/2021 with 3-day history of malaise, fall and progressive somnolence.  She was found to have RLL aspiration pneumonia as well as IPH due to right hemorrhagic infarct with edema and right frontal, parietal, occipital and temporal lobes and 9 mm right to left midline shift.  She was started on hypertonic saline and EEG done showing evidence of epileptogenicity with high potential for seizures.  Work-up also revealed subocclusive clot in right distal right transverse sinus and right sigmoid dural venous sinus and she was started on IV heparin.  Dr. Erlinda Hong felt that patient with large right frontoparietal venous infarct with hemorrhagic conversion due to unclear etiology.  Neurosurgery was consulted due to concerns about the herniation and recommended conservative care with serial CCT for monitoring. She was loaded with Dilantin due to concerns of seizure and placed on long-term EEG.  Dilantin was discontinued and Keppra added on 02/26.  Hypertonic saline was weaned off and mentation was slowly improving.  Repeat CTA/V of 02/27 showed evolving hemorrhagic infarct with mild improvement in mass effect and improved nonocclusive thrombus in right distal transverse and sigmoid sinus.  She was transition to [place] on 02/28 with neurology recommending 3 to 6 months of AC as well as close neurological follow-up.  Hospital course was significant for issues with headaches, hypotension, tachycardia, left-sided weakness with balance deficits as well as cognitive deficits and disorientation.  CIR was recommended due to functional decline.     OTHER MEDICAL CONDITIONS: SS Trait, anemia    REVIEW OF SYSTEMS: Full 14  system review of systems performed and negative with exception of: as noted in the HPI   ALLERGIES: Allergies  Allergen Reactions   Megestrol Other (See Comments) and Swelling    Caused blood clot in brain Caused blood clot in brain    HOME MEDICATIONS: Outpatient Medications Prior to Visit  Medication Sig Dispense Refill   ACETAMINOPHEN-BUTALBITAL 50-325 MG TABS Take 1 tablet by mouth 2 (two) times daily as needed. 60 tablet 0   aspirin EC 81 MG tablet Take 81 mg by mouth daily. Swallow whole.     diclofenac Sodium (VOLTAREN) 1 % GEL Place onto the skin.     topiramate (TOPAMAX) 100 MG tablet Take 1 tablet (100 mg total) by mouth at bedtime. 60 tablet 0   atorvastatin (LIPITOR) 40 MG tablet Take by mouth.     levETIRAcetam (KEPPRA) 500 MG tablet Take 1 tablet (500 mg total) by mouth 2 (two) times daily. 180 tablet 3   metoprolol tartrate (LOPRESSOR) 25 MG tablet Take 0.5 tablets (12.5 mg total) by mouth 2 (two) times daily. 30 tablet 0   PARoxetine (PAXIL) 10 MG tablet Take 1 tablet (10 mg total) by mouth daily. 30 tablet 0   rivaroxaban (XARELTO) 20 MG TABS tablet Take 1  tablet (20 mg total) by mouth daily with supper. 30 tablet 3   zaleplon (SONATA) 5 MG capsule Take by mouth.     cyclobenzaprine (FLEXERIL) 10 MG tablet Take 1 tablet (10 mg total) by mouth 3 (three) times daily as needed for muscle spasms. (Patient not taking: Reported on 05/07/2022) 30 tablet 0   levETIRAcetam (KEPPRA) 500 MG tablet Take by mouth.     No facility-administered medications prior to visit.    PAST MEDICAL HISTORY: Past Medical History:  Diagnosis Date   Asthma    Vaginal bleeding, abnormal    for 4 months    PAST SURGICAL HISTORY: Past Surgical History:  Procedure Laterality Date   FOOT SURGERY     TUBAL LIGATION      FAMILY HISTORY: Family History  Problem Relation Age of Onset   Clotting disorder Mother     SOCIAL HISTORY: Social History   Socioeconomic History   Marital  status: Single    Spouse name: Not on file   Number of children: Not on file   Years of education: Not on file   Highest education level: Not on file  Occupational History   Not on file  Tobacco Use   Smoking status: Never   Smokeless tobacco: Never  Substance and Sexual Activity   Alcohol use: Yes   Drug use: Never   Sexual activity: Yes  Other Topics Concern   Not on file  Social History Narrative   Not on file   Social Determinants of Health   Financial Resource Strain: Not on file  Food Insecurity: Not on file  Transportation Needs: Not on file  Physical Activity: Not on file  Stress: Not on file  Social Connections: Not on file  Intimate Partner Violence: Not on file    PHYSICAL EXAM  GENERAL EXAM/CONSTITUTIONAL: Vitals:  Vitals:   05/07/22 1002  BP: 120/83  Pulse: (!) 128  Weight: 149 lb (67.6 kg)  Height: '4\' 11"'$  (1.499 m)    Body mass index is 30.09 kg/m. Wt Readings from Last 3 Encounters:  05/07/22 149 lb (67.6 kg)  03/21/22 147 lb 4.8 oz (66.8 kg)  03/12/22 146 lb (66.2 kg)   Patient is in no distress; well developed, nourished and groomed; neck is supple  EYES: Pupils round and reactive to light, there is left visual field cut, Extraocular movements intacts,   MUSCULOSKELETAL: Gait, strength, tone, movements noted in Neurologic exam below  NEUROLOGIC: MENTAL STATUS:      No data to display         awake, alert, oriented to person, place and time recent and remote memory intact normal attention and concentration language fluent, comprehension intact, naming intact fund of knowledge appropriate  CRANIAL NERVE:  2nd, 3rd, 4th, 6th - pupils equal and reactive to light, Left visual field cut, extraocular muscles intact, no nystagmus 5th - facial sensation symmetric 7th - facial strength symmetric 8th - hearing intact 9th - palate elevates symmetrically, uvula midline 11th - shoulder shrug symmetric 12th - tongue protrusion  midline  MOTOR:  normal bulk and tone, full strength in the BUE, BLE  SENSORY:  normal and symmetric to light touch, pinprick, temperature, vibration  COORDINATION:  finger-nose-finger, fine finger movements normal  REFLEXES:  deep tendon reflexes present and symmetric  GAIT/STATION:  normal   DIAGNOSTIC DATA (LABS, IMAGING, TESTING) - I reviewed patient records, labs, notes, testing and imaging myself where available.  Lab Results  Component Value Date   WBC 4.2  10/19/2021   HGB 12.5 10/19/2021   HCT 39.8 10/19/2021   MCV 78.7 (L) 10/19/2021   PLT 253 10/19/2021      Component Value Date/Time   NA 137 10/19/2021 1250   K 3.9 10/19/2021 1250   CL 106 10/19/2021 1250   CO2 23 10/19/2021 1250   GLUCOSE 92 10/19/2021 1250   BUN <5 (L) 10/19/2021 1250   CREATININE 0.76 10/19/2021 1250   CALCIUM 9.3 10/19/2021 1250   PROT 6.8 09/20/2021 0544   ALBUMIN 3.0 (L) 09/20/2021 0544   AST 53 (H) 09/20/2021 0544   ALT 80 (H) 09/20/2021 0544   ALKPHOS 83 09/20/2021 0544   BILITOT 0.3 09/20/2021 0544   GFRNONAA >60 10/19/2021 1250   Lab Results  Component Value Date   CHOL 139 09/11/2021   HDL 46 09/11/2021   LDLCALC 84 09/11/2021   TRIG 47 09/11/2021   CHOLHDL 3.0 09/11/2021   Lab Results  Component Value Date   HGBA1C 5.3 09/11/2021   Lab Results  Component Value Date   VITAMINB12 364 09/11/2021   Lab Results  Component Value Date   TSH 0.164 (L) 09/11/2021    CT Venogram 11/28/2021 1. 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 Previously seen blood products have resolved. 2. Residual nonocclusive thrombus involving the distal right transverse and sigmoid sinuses, persistent but somewhat improved in appearance as compared to previous exam. No new dural sinus thrombosis. 3. No other new acute intracranial abnormality.  CT Venogram 09/17/21. Evolving hemorrhagic infarction  primarily involving temporal and occipital lobes with extension into the parietal lobe. No new hemorrhage. Mass effect is stable to mildly improved. Persistent but improved nonocclusive thrombus within the right distal transverse and sigmoid sinuses   Head CT 09/10/21 Large region of cortical/subcortical edema within the right frontal, parietal, occipital and temporal lobes, as well as right insula and subinsular region. Superimposed patchy foci of acute parenchymal hemorrhage within the right parietal, occipital and temporal lobes. These findings are favored to reflect an acute/early subacute infarct from arterial ischemia (with hemorrhagic conversion) or a hemorrhagic infarct due to intracranial venous thrombosis. An MRI of the brain with contrast (including thin-slice [id number] post-contrast imaging for assessment of the intracranial venous structures) is recommended for further evaluation. A CTA of the head/neck should also be considered.   Associated mass effect with significant partial effacement of the right lateral ventricle and 9 mm leftward midline shift. The basal cisterns are effaced. Neurosurgical consultation is advised.   Moderate-volume extension of hemorrhage into the right lateral ventricle is questioned. No definite evidence of hydrocephalus or ventricular entrapment at this time   MRI Brain 09/10/21 1. Large area of cortical infarction in the right temporal, occipital, parietal, and posterior frontal lobes, with significant associated edema and redemonstrated intraparenchymal hemorrhage. The mass effect causes 11 mm of right-to-left midline shift, medial displacement of the right uncus and right temporal horn and effacement of the basal cisterns, unchanged from the prior CT and raising concern for right uncal herniation. 2. Additional focal area of restricted diffusion in the medial aspect of the right basal ganglia, suspected to be in the preoptic area of the hypothalamus, likely  sequela of aforementioned mass effect. 3. Hemorrhage layering in the occipital horns of the lateral ventricles, with redemonstrated effacement of the right lateral and third ventricle. No evidence of hydrocephalus. 4. Small amount of subdural hemorrhage along the right frontal lobe, measuring up to 4 mm. 5. Filling  defect in the distal right transverse sinus and proximal and mid right sigmoid sinus, consistent with thrombus, as seen on the same-day CTV.   LTM EEG 09/11/21 - Sharp waves,  right hemisphere, maximal right parieto-occipital region  - Continuous slow, generalized and lateralized right hemisphere     ASSESSMENT AND PLAN  51 y.o. year old female with medical history of sickle cell, chronic anemia here for follow up for large right hemispheric intracerebral hemorrhage due to cerebral venous sinus thrombosis.  Patient did have subsequent seizures and brain edema.  She has been anticoagulated initially on heparin and now on DOAC and now off anticoagulation.  She is presenting with complaints of headaches, she on topiramate 100 mg nightly but has memory complaints, will switch Topiramate to Amitriptyline since she is complaining of increase anxiety and decrease sleep. She has not refill her Paxil, will send a new prescription. I also advise her to continue with Flexeril and Ibuprofen/Tylenol as needed. She voices understanding.  In term of the seizure, based on the side of the injury, will continue her on Keppra for a total of a year prior to discussing medication withdrawal. She voices understanding.     1. Seizure ([place])   2. Chronic tension-type headache, not intractable   3. Cerebral venous sinus thrombosis   4. Cerebral edema ([facility])   5. Other right-sided nontraumatic intracerebral hemorrhage ([place])   6. Anxiety       Patient Instructions  Decrease Topamax to 50 mg (1/2 tablet of 100 mg) nightly until you finish the 100 mg tablets  Then further decrease to 25 mg (1/2 tablets of  the 50 mg) nightly until you have finished the 50 mg tablet Start with Amitriptyline 25 mg (1/2 tablet nightly 12.5 mg) nightly for one week then increase to 25 mg nightly  Use Flexeril, Ibuprofen or Tylenol as needed for the headaches  Continue with aspirin 81 mg daily  Continue with Keppra 500 mg twice daily  Restart Paxil 10 mg daily  Return in 3 months or sooner if worse  No orders of the defined types were placed in this encounter.   Meds ordered this encounter  Medications   cyclobenzaprine (FLEXERIL) 10 MG tablet    Sig: Take 1 tablet (10 mg total) by mouth 3 (three) times daily as needed for muscle spasms.    Dispense:  30 tablet    Refill:  0   levETIRAcetam (KEPPRA) 500 MG tablet    Sig: Take 1 tablet (500 mg total) by mouth 2 (two) times daily.    Dispense:  180 tablet    Refill:  3   PARoxetine (PAXIL) 10 MG tablet    Sig: Take 1 tablet (10 mg total) by mouth daily.    Dispense:  30 tablet    Refill:  3    Please establish with PCP for further refills.   amitriptyline (ELAVIL) 25 MG tablet    Sig: Take 1 tablet (25 mg total) by mouth at bedtime.    Dispense:  30 tablet    Refill:  3    Return in about 3 months (around 08/07/2022).  I have spent a total of 45 minutes dedicated to this patient today, preparing to see patient, performing a medically appropriate examination and evaluation, ordering tests and/or medications and procedures, and counseling and educating the patient/family/caregiver; independently interpreting result and communicating results to the family/patient/caregiver; and documenting clinical information in the electronic medical record.   Alric Ran, MD 05/07/2022, 12:14 PM  [facility] [address], [address] [place], [place] [address] [phone] 

## 2022-05-07 NOTE — Patient Instructions (Addendum)
Decrease Topamax to 50 mg (1/2 tablet of 100 mg) nightly until you finish the 100 mg tablets  Then further decrease to 25 mg (1/2 tablets of the 50 mg) nightly until you have finished the 50 mg tablet Start with Amitriptyline 25 mg (1/2 tablet nightly 12.5 mg) nightly for one week then increase to 25 mg nightly  Use Flexeril, Ibuprofen or Tylenol as needed for the headaches  Continue with aspirin 81 mg daily  Continue with Keppra 500 mg twice daily  Restart Paxil 10 mg daily  Return in 3 months or sooner if worse

## 2022-05-11 ENCOUNTER — Other Ambulatory Visit: Payer: Self-pay | Admitting: Neurology

## 2022-05-13 ENCOUNTER — Telehealth: Payer: Self-pay | Admitting: Neurology

## 2022-05-13 NOTE — Telephone Encounter (Signed)
Pt is aware of Request refused: Request already responded to by other means (e.g. phone or fax) She asked that it be noted that she has not had her  levETIRAcetam (KEPPRA) 500 MG tablet since Friday and that she woke up with a headache

## 2022-05-14 NOTE — Telephone Encounter (Signed)
I have sent a my chart message to the pt on this. We sent rx for Keppra on 05/07/2022 to [facility].  Pt needs to call pharmacy or refill pick up.

## 2022-05-30 ENCOUNTER — Other Ambulatory Visit: Payer: Self-pay | Admitting: Neurology

## 2022-06-17 ENCOUNTER — Telehealth: Payer: Self-pay | Admitting: Neurology

## 2022-06-17 NOTE — Telephone Encounter (Signed)
Pt is calling. Stated she need to follow up on her disability paper. She said Dr. April Manson told her he was going to fill out and make sure they are faxed back. Pt said she received a letter stating they never got the paperwork back. Pt is requesting a call-back from nurse.

## 2022-06-17 NOTE — Telephone Encounter (Signed)
FMLA paperwork faxed to [employer] [phone]) on 11/27

## 2022-06-20 ENCOUNTER — Encounter: Payer: Self-pay | Admitting: Neurology

## 2022-06-20 ENCOUNTER — Inpatient Hospital Stay: Payer: Self-pay

## 2022-06-20 ENCOUNTER — Other Ambulatory Visit: Payer: Self-pay

## 2022-06-20 ENCOUNTER — Inpatient Hospital Stay: Payer: Self-pay | Attending: Hematology and Oncology | Admitting: Hematology and Oncology

## 2022-06-20 ENCOUNTER — Encounter: Payer: Self-pay | Admitting: Hematology and Oncology

## 2022-06-20 VITALS — BP 126/100 | HR 104 | Temp 97.7°F | Resp 15 | Wt 158.5 lb

## 2022-06-20 DIAGNOSIS — Z7982 Long term (current) use of aspirin: Secondary | ICD-10-CM | POA: Insufficient documentation

## 2022-06-20 DIAGNOSIS — G08 Intracranial and intraspinal phlebitis and thrombophlebitis: Secondary | ICD-10-CM | POA: Insufficient documentation

## 2022-06-20 DIAGNOSIS — R441 Visual hallucinations: Secondary | ICD-10-CM | POA: Insufficient documentation

## 2022-06-20 DIAGNOSIS — R519 Headache, unspecified: Secondary | ICD-10-CM | POA: Insufficient documentation

## 2022-06-20 DIAGNOSIS — R440 Auditory hallucinations: Secondary | ICD-10-CM | POA: Insufficient documentation

## 2022-06-20 NOTE — Progress Notes (Signed)
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 Since last visit, she is here for follow-up.  She tells me that she continues to have headaches, now has some auditory and visual hallucinations.  She recently followed up with her neurologist who mentioned it could be medication related but she is not sure if any of her medications were actually changed.  She is really worried about this hallucinations.  She continues to work on her strength and is currently on disability.  She is now only on aspirin.  Physical examination today with no new concerns.  We have discussed about repeating lupus anticoagulant panel today.  If this is unremarkable, she can return to clinic for follow-up with [place] in 1 year.  She understands that there is a small risk of recurrence of the cerebral venous sinus thrombus.  She was encouraged to continue follow-up with her other providers.  I have also sent an in basket message to Dr. April Reilly about her hallucinations.  All her questions were answered to the best of my knowledge.   Return to clinic in 1 yr  HISTORY OF PRESENTING ILLNESS:   April Reilly 51 y.o. female is here because of DVT/cerebral venous sinus thrombosis.  This is a pleasant 51 year old female patient who initially went to the urgent care with abnormal vaginal bleeding started on Megace and then was admitted with 3-day history of malaise somnolence was found to have right hemorrhagic infarct, right lower lobe aspiration pneumonia.  She is okay she was also found to have subocclusive clot in the right distal transverse sinus right sigmoid dural venous sinus.  I spoke I spoke to we will just see her when  she is done with radiation if she does not we will have to lets resched okay ule her 6 weeks if she is not She was on Eliquis for anticoagulation. But she lost insurance and couldn't afford Eliquis.  Hypercoagulable workup unremarkable except for lupus anticoagulant which is likely false positive because she was on anticoagulation.  She is now on aspirin alone.  She is here for follow-up.  Since last visit, she complains of some ongoing headaches, or weakness, some auditory and visual hallucinations.  She is most concerned today about her hallucinations.  She tells me that Dr. April Reilly reviewed her medications and wondered if it is from Topamax and may have reduced the dose but she is still taking it. Rest of the pertinent 10 point ROS reviewed and negative  MEDICAL HISTORY:  Past Medical History:  Diagnosis Date   Asthma    Vaginal bleeding, abnormal    for 4 months    SURGICAL HISTORY: Past Surgical History:  Procedure Laterality Date   FOOT SURGERY     TUBAL LIGATION      SOCIAL HISTORY: Social History   Socioeconomic History   Marital status: Single    Spouse name: Not on file   Number of children: Not on file   Years of education: Not on file   Highest education level: Not on file  Occupational History   Not on file  Tobacco Use   Smoking status: Never   Smokeless tobacco: Never  Substance and Sexual Activity   Alcohol use:  Yes   Drug use: Never   Sexual activity: Yes  Other Topics Concern   Not on file  Social History Narrative   Not on file   Social Determinants of Health   Financial Resource Strain: Not on file  Food Insecurity: Not on file  Transportation Needs: Not on file  Physical Activity: Not on file  Stress: Not on file  Social Connections: Not on file  Intimate Partner Violence: Not on file    FAMILY HISTORY: Family History  Problem Relation Age of Onset   Clotting disorder Mother     ALLERGIES:  is allergic to megestrol.  MEDICATIONS:   Current Outpatient Medications  Medication Sig Dispense Refill   ACETAMINOPHEN-BUTALBITAL 50-325 MG TABS Take 1 tablet by mouth 2 (two) times daily as needed. 60 tablet 0   amitriptyline (ELAVIL) 25 MG tablet TAKE 1 TABLET BY MOUTH EVERYDAY AT BEDTIME 90 tablet 2   aspirin EC 81 MG tablet Take 81 mg by mouth daily. Swallow whole.     cyclobenzaprine (FLEXERIL) 10 MG tablet Take 1 tablet (10 mg total) by mouth 3 (three) times daily as needed for muscle spasms. 30 tablet 0   diclofenac Sodium (VOLTAREN) 1 % GEL Place onto the skin.     levETIRAcetam (KEPPRA) 500 MG tablet Take 1 tablet (500 mg total) by mouth 2 (two) times daily. 180 tablet 3   PARoxetine (PAXIL) 10 MG tablet Take 1 tablet (10 mg total) by mouth daily. 30 tablet 3   topiramate (TOPAMAX) 100 MG tablet Take 1 tablet (100 mg total) by mouth at bedtime. 60 tablet 0   No current facility-administered medications for this visit.     PHYSICAL EXAMINATION: ECOG PERFORMANCE STATUS: 0 - Asymptomatic  Vitals:   06/20/22 1324  BP: (!) 126/100  Pulse: (!) 104  Resp: 15  Temp: 97.7 F (36.5 C)  SpO2: 99%   Filed Weights   06/20/22 1324  Weight: 158 lb 8 oz (71.9 kg)   Physical Exam Constitutional:      Appearance: Normal appearance.  Cardiovascular:     Rate and Rhythm: Normal rate and regular rhythm.     Pulses: Normal pulses.     Heart sounds: Normal heart sounds.  Pulmonary:     Effort: Pulmonary effort is normal.     Breath sounds: Normal breath sounds.  Musculoskeletal:     Cervical back: Normal range of motion and neck supple. No rigidity.  Lymphadenopathy:     Cervical: No cervical adenopathy.  Neurological:     Mental Status: She is alert.     Motor: Weakness (Left upper extremity weakness) present.  Psychiatric:        Mood and Affect: Mood normal.      LABORATORY DATA:  I have reviewed the data as listed Lab Results  Component Value Date   WBC 4.2 10/19/2021   HGB 12.5 10/19/2021   HCT 39.8  10/19/2021   MCV 78.7 (L) 10/19/2021   PLT 253 10/19/2021     Chemistry      Component Value Date/Time   NA 137 10/19/2021 1250   K 3.9 10/19/2021 1250   CL 106 10/19/2021 1250   CO2 23 10/19/2021 1250   BUN <5 (L) 10/19/2021 1250   CREATININE 0.76 10/19/2021 1250      Component Value Date/Time   CALCIUM 9.3 10/19/2021 1250   ALKPHOS 83 09/20/2021 0544   AST 53 (H) 09/20/2021 0544   ALT 80 (H) 09/20/2021 0544   BILITOT  0.3 09/20/2021 0544      MRI Brain 09/10/21 1. Large area of cortical infarction in the right temporal, occipital, parietal, and posterior frontal lobes, with significant associated edema and redemonstrated intraparenchymal hemorrhage. The mass effect causes 11 mm of right-to-left midline shift, medial displacement of the right uncus and right temporal horn and effacement of the basal cisterns, unchanged from the prior CT and raising concern for right uncal herniation. 2. Additional focal area of restricted diffusion in the medial aspect of the right basal ganglia, suspected to be in the preoptic area of the hypothalamus, likely sequela of aforementioned mass effect. 3. Hemorrhage layering in the occipital horns of the lateral ventricles, with redemonstrated effacement of the right lateral and third ventricle. No evidence of hydrocephalus. 4. Small amount of subdural hemorrhage along the right frontal lobe, measuring up to 4 mm. 5. Filling defect in the distal right transverse sinus and proximal and mid right sigmoid sinus, consistent with thrombus, as seen on the same-day CTV.    RADIOGRAPHIC STUDIES: I have personally reviewed the radiological images as listed and agreed with the findings in the report. No results found.  All questions were answered. The patient knows to call the clinic with any problems, questions or concerns. I spent 30 min in the care of this patient including H and P, review of records, counseling and coordination of care.     Benay Pike, MD 06/20/2022 1:39 PM

## 2022-06-22 LAB — LUPUS ANTICOAGULANT PANEL
DRVVT: 32.6 s (ref 0.0–47.0)
PTT Lupus Anticoagulant: 36.5 s (ref 0.0–43.5)

## 2022-06-24 NOTE — Telephone Encounter (Signed)
Pt is calling. [place] didn't received all the (FMLA paperwork. She stated it was missing some pages. Pt is requesting a call back from nurse

## 2022-07-24 ENCOUNTER — Telehealth: Payer: Self-pay | Admitting: Neurology

## 2022-07-24 MED ORDER — CYCLOBENZAPRINE HCL 10 MG PO TABS: 10.0000 mg | ORAL_TABLET | Freq: Three times a day (TID) | ORAL | 0 refills | Status: DC | PRN

## 2022-07-24 NOTE — Telephone Encounter (Signed)
Rx refilled as per last office visit note.

## 2022-07-24 NOTE — Telephone Encounter (Signed)
Pt is needing a refill for her cyclobenzaprine (FLEXERIL) 10 MG tablet sent in to the [facility] on [facility]

## 2022-07-30 ENCOUNTER — Ambulatory Visit: Payer: Self-pay | Admitting: Neurology

## 2022-07-30 ENCOUNTER — Ambulatory Visit: Payer: Medicaid Other | Admitting: Neurology

## 2022-07-30 ENCOUNTER — Encounter: Payer: Self-pay | Admitting: Neurology

## 2022-07-30 VITALS — BP 126/86 | HR 106 | Ht 59.0 in | Wt 158.0 lb

## 2022-07-30 DIAGNOSIS — G44229 Chronic tension-type headache, not intractable: Secondary | ICD-10-CM

## 2022-07-30 DIAGNOSIS — F419 Anxiety disorder, unspecified: Secondary | ICD-10-CM | POA: Diagnosis not present

## 2022-07-30 DIAGNOSIS — G08 Intracranial and intraspinal phlebitis and thrombophlebitis: Secondary | ICD-10-CM | POA: Diagnosis not present

## 2022-07-30 DIAGNOSIS — R569 Unspecified convulsions: Secondary | ICD-10-CM

## 2022-07-30 MED ORDER — AMITRIPTYLINE HCL 25 MG PO TABS: ORAL_TABLET | ORAL | 2 refills | Status: DC

## 2022-07-30 MED ORDER — LEVETIRACETAM 500 MG PO TABS: 500.0000 mg | ORAL_TABLET | Freq: Two times a day (BID) | ORAL | 3 refills | Status: DC

## 2022-07-30 MED ORDER — ZOLPIDEM TARTRATE 5 MG PO TABS: 5.0000 mg | ORAL_TABLET | Freq: Every evening | ORAL | 0 refills | Status: DC | PRN

## 2022-07-30 MED ORDER — PAROXETINE HCL 10 MG PO TABS: 10.0000 mg | ORAL_TABLET | Freq: Every day | ORAL | 3 refills | Status: DC

## 2022-07-30 NOTE — Patient Instructions (Addendum)
Please decrease Topamax to 1/2 tablet nightly until you run out of medication then discontinue it.  Start Elavil 25 mg nightly for headache prevention  Start Paxil 10 mg daily for anxiety  Start Ambien 5 mg nightly for insomnia Continue Keppra for now, at next visit, will start the weaning process  Continue with Flexeril, Tylenol/Ibuprofen as needed  Continue Aspirin daily  Set up care with PCP  Referral to behavioral health Follow up in 6 months or sooner if worse

## 2022-07-30 NOTE — Progress Notes (Signed)
[facility]  PATIENT: April Reilly DOB: 1970/08/11  REQUESTING CLINICIAN: No ref. provider found HISTORY FROM: Patient REASON FOR VISIT: Venous thrombosis follow up   HISTORICAL  CHIEF COMPLAINT:  Chief Complaint  Patient presents with   Follow-up    Room 12, alone  States she is stable, no new concerns    INTERVAL HISTORY 07/30/2022:  Patient presents today for follow-up, at last visit in October plan was to discontinue Topamax, start Elavil and Paxil.  Patient has not started those medications and she is still on Topamax.  She still complains of severe headaches about 2-3 times per week.  When she had these bad headaches, she takes muscle relaxant.  She also reports increase in anxiety, trouble falling asleep, patient's reported she is scare to sleep because she lives alone.  Lately she has been sleeping with a loaded gun on her bed. She still has not set up care with a primary care physician     INTERVAL HISTORY 05/07/2022:  April Reilly presents today for follow-up, she is alone.  Since last visit she had an episode of severe vaginal bleeding, her Xarelto was discontinued.  Since she has been anticoagulated for 8 months,  it was reasonable to discontinue Xarelto.  She is presenting today complaining of headache, at last visit we had increased her Topamax to 100 mg nightly but she still reports headache that usually start in the back of the neck.  She reported Flexeril helps but she ran out of medication.  She also tried Fioricet which helped her.  She denies any migrainous features, denies any nausea or vomiting.  She reported she is very anxious and worry about her brain injury, she cannot remember her seizure and worried about having another seizure.  Her vision is still affected, and wonder if she will get her vision back. She has not seen oph due to lack of insurance.    INTERVAL HISTORY 03/12/2022:  Patient presents today for follow-up, last visit was in June at  that time plan was to continue with Eliquis and obtain a repeat CT venogram.  She reported she ran out of Eliquis and actually her insurance dropped her because of FMLA.  Luckily she did follow-up with Dr. Chryl Heck, hematologist and patient was started on Xarelto.  She did have her CT venogram yesterday but pending official read.  She reports that she still having occasional headaches that is relieved by muscle relaxant, her vision is still poor and she is pending a ophthalmology follow-up next week.  Denies any recent fall.  Denies any new focal weakness.     INTERVAL HISTORY 12/24/21:  Patient presents today for follow-up, her CT venogram was repeated and it showed residual nonocclusive thrombus involving the distal right transverse and sigmoid sinus sinuses.,  Persistent but somewhat improved in appearance as compared to previous exam.  No new dural sinus thrombosis.  She remains on Eliquis 5 mg twice daily.  She did follow-up with primary care doctor and hematology.  She is pending a complete hematology work-up.  Currently she is complaining of left shoulder arm pain, some migraine headaches.  She ran out of some of her medications more than a month ago and have not refill it.    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  She was initiated on anticoagulation.  Neurosurgery was consulted but patient did not have a craniectomy.  She developed seizures, started on Keppra  Her symptoms improve, she was discharge to acute rehab and her plan was to continue  with anticoagulation for at least 6 months.  Patient report prior to her hospitalization she has been doing well, she has chronic anemia, sickle cell disease but she was not taken on any medication.  She reports a few weeks prior to presentation she developed vaginal bleeding and she was started on megestrol acetate.  Since discharge from the hospital, daughter reported improvement of her mental status, confusion improved, she does continue with physical therapy and she is getting more independent.  She is compliant with all medications.  Patient reports prior to her hospitalization she was not taking any medication but since discharge she is on 13 medications, she feels like she does not need all of them.   Hospital course below. April Reilly is a 52 y.o. female with history of SS trait, uterine fibroid, abnormal vaginal bleeding x4 months who was admitted on 09/10/2021 with 3-day history of malaise, fall and progressive somnolence.  She was found to have RLL aspiration pneumonia as well as IPH due to right hemorrhagic infarct with edema and right frontal, parietal, occipital and temporal lobes and 9 mm right to left midline shift.  She was started on hypertonic saline and EEG done showing evidence of epileptogenicity with high potential for seizures.  Work-up also revealed subocclusive clot in right distal right transverse sinus and right sigmoid dural venous sinus and she was started on IV heparin.  Dr. Erlinda Hong felt that patient with large right frontoparietal venous infarct with hemorrhagic conversion due to unclear etiology.  Neurosurgery was consulted due to concerns about the herniation and recommended conservative care with serial CCT for monitoring. She was loaded with Dilantin due to concerns of seizure and placed on long-term EEG.  Dilantin was discontinued and Keppra added on 02/26.  Hypertonic saline was weaned off and mentation was slowly improving.  Repeat CTA/V of 02/27 showed evolving hemorrhagic  infarct with mild improvement in mass effect and improved nonocclusive thrombus in right distal transverse and sigmoid sinus.  She was transition to [facility] on 02/28 with neurology recommending 3 to 6 months of AC as well as close neurological follow-up.  Hospital course was significant for issues with headaches, hypotension, tachycardia, left-sided weakness with balance deficits as well as cognitive deficits and disorientation.  CIR was recommended due to functional decline.     OTHER MEDICAL CONDITIONS: SS Trait, anemia    REVIEW OF SYSTEMS: Full 14 system review of systems performed and negative with exception of: as noted in the HPI   ALLERGIES: Allergies  Allergen Reactions   Megestrol Other (See Comments) and Swelling    Caused blood clot in brain Caused blood clot in brain    HOME MEDICATIONS: Outpatient Medications Prior to Visit  Medication Sig Dispense Refill   ACETAMINOPHEN-BUTALBITAL 50-325 MG TABS Take 1 tablet by mouth 2 (two) times daily as needed. 60 tablet 0   aspirin EC 81 MG tablet Take 81 mg by mouth daily. Swallow whole.     cyclobenzaprine (FLEXERIL) 10 MG tablet Take 1 tablet (10 mg total) by mouth  3 (three) times daily as needed for muscle spasms. 30 tablet 0   diclofenac Sodium (VOLTAREN) 1 % GEL Place onto the skin.     levETIRAcetam (KEPPRA) 500 MG tablet Take 1 tablet (500 mg total) by mouth 2 (two) times daily. 180 tablet 3   amitriptyline (ELAVIL) 25 MG tablet TAKE 1 TABLET BY MOUTH EVERYDAY AT BEDTIME 90 tablet 2   PARoxetine (PAXIL) 10 MG tablet Take 1 tablet (10 mg total) by mouth daily. 30 tablet 3   topiramate (TOPAMAX) 100 MG tablet Take 1 tablet (100 mg total) by mouth at bedtime. 60 tablet 0   No facility-administered medications prior to visit.    PAST MEDICAL HISTORY: Past Medical History:  Diagnosis Date   Asthma    Vaginal bleeding, abnormal    for 4 months    PAST SURGICAL HISTORY: Past Surgical History:  Procedure Laterality Date    FOOT SURGERY     TUBAL LIGATION      FAMILY HISTORY: Family History  Problem Relation Age of Onset   Clotting disorder Mother     SOCIAL HISTORY: Social History   Socioeconomic History   Marital status: Single    Spouse name: Not on file   Number of children: Not on file   Years of education: Not on file   Highest education level: Not on file  Occupational History   Not on file  Tobacco Use   Smoking status: Never   Smokeless tobacco: Never  Substance and Sexual Activity   Alcohol use: Yes   Drug use: Never   Sexual activity: Yes  Other Topics Concern   Not on file  Social History Narrative   Not on file   Social Determinants of Health   Financial Resource Strain: Not on file  Food Insecurity: Not on file  Transportation Needs: Not on file  Physical Activity: Not on file  Stress: Not on file  Social Connections: Not on file  Intimate Partner Violence: Not on file    PHYSICAL EXAM  GENERAL EXAM/CONSTITUTIONAL: Vitals:  Vitals:   07/30/22 1010  BP: 126/86  Pulse: (!) 106  Weight: 158 lb (71.7 kg)  Height: '4\' 11"'$  (1.499 m)    Body mass index is 31.91 kg/m. Wt Readings from Last 3 Encounters:  07/30/22 158 lb (71.7 kg)  06/20/22 158 lb 8 oz (71.9 kg)  05/07/22 149 lb (67.6 kg)   Patient is in no distress; well developed, nourished and groomed; neck is supple  EYES: Pupils round and reactive to light, there is left visual field cut, Extraocular movements intacts,   MUSCULOSKELETAL: Gait, strength, tone, movements noted in Neurologic exam below  NEUROLOGIC: MENTAL STATUS:      No data to display         awake, alert, oriented to person, place and time recent and remote memory intact normal attention and concentration language fluent, comprehension intact, naming intact fund of knowledge appropriate  CRANIAL NERVE:  2nd, 3rd, 4th, 6th - Left visual field cut, extraocular muscles intact, no nystagmus 5th - facial sensation symmetric 7th  - facial strength symmetric 8th - hearing intact 9th - palate elevates symmetrically, uvula midline 11th - shoulder shrug symmetric 12th - tongue protrusion midline  MOTOR:  normal bulk and tone, full strength in the BUE, BLE  SENSORY:  normal and symmetric to light touch  COORDINATION:  finger-nose-finger, fine finger movements normal   GAIT/STATION:  normal   DIAGNOSTIC DATA (LABS, IMAGING, TESTING) - I reviewed patient records,  labs, notes, testing and imaging myself where available.  Lab Results  Component Value Date   WBC 4.2 10/19/2021   HGB 12.5 10/19/2021   HCT 39.8 10/19/2021   MCV 78.7 (L) 10/19/2021   PLT 253 10/19/2021      Component Value Date/Time   NA 137 10/19/2021 1250   K 3.9 10/19/2021 1250   CL 106 10/19/2021 1250   CO2 23 10/19/2021 1250   GLUCOSE 92 10/19/2021 1250   BUN <5 (L) 10/19/2021 1250   CREATININE 0.76 10/19/2021 1250   CALCIUM 9.3 10/19/2021 1250   PROT 6.8 09/20/2021 0544   ALBUMIN 3.0 (L) 09/20/2021 0544   AST 53 (H) 09/20/2021 0544   ALT 80 (H) 09/20/2021 0544   ALKPHOS 83 09/20/2021 0544   BILITOT 0.3 09/20/2021 0544   GFRNONAA >60 10/19/2021 1250   Lab Results  Component Value Date   CHOL 139 09/11/2021   HDL 46 09/11/2021   LDLCALC 84 09/11/2021   TRIG 47 09/11/2021   CHOLHDL 3.0 09/11/2021   Lab Results  Component Value Date   HGBA1C 5.3 09/11/2021   Lab Results  Component Value Date   VITAMINB12 364 09/11/2021   Lab Results  Component Value Date   TSH 0.164 (L) 09/11/2021    CT Venogram 11/28/2021 1. 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 Previously seen blood products have resolved. 2. Residual nonocclusive thrombus involving the distal right transverse and sigmoid sinuses, persistent but somewhat improved in appearance as compared to previous exam. No new dural sinus thrombosis. 3. No other new acute intracranial  abnormality.  CT Venogram 09/17/21. Evolving hemorrhagic infarction primarily involving temporal and occipital lobes with extension into the parietal lobe. No new hemorrhage. Mass effect is stable to mildly improved. Persistent but improved nonocclusive thrombus within the right distal transverse and sigmoid sinuses   Head CT 09/10/21 Large region of cortical/subcortical edema within the right frontal, parietal, occipital and temporal lobes, as well as right insula and subinsular region. Superimposed patchy foci of acute parenchymal hemorrhage within the right parietal, occipital and temporal lobes. These findings are favored to reflect an acute/early subacute infarct from arterial ischemia (with hemorrhagic conversion) or a hemorrhagic infarct due to intracranial venous thrombosis. An MRI of the brain with contrast (including thin-slice [id number] post-contrast imaging for assessment of the intracranial venous structures) is recommended for further evaluation. A CTA of the head/neck should also be considered.   Associated mass effect with significant partial effacement of the right lateral ventricle and 9 mm leftward midline shift. The basal cisterns are effaced. Neurosurgical consultation is advised.   Moderate-volume extension of hemorrhage into the right lateral ventricle is questioned. No definite evidence of hydrocephalus or ventricular entrapment at this time   MRI Brain 09/10/21 1. Large area of cortical infarction in the right temporal, occipital, parietal, and posterior frontal lobes, with significant associated edema and redemonstrated intraparenchymal hemorrhage. The mass effect causes 11 mm of right-to-left midline shift, medial displacement of the right uncus and right temporal horn and effacement of the basal cisterns, unchanged from the prior CT and raising concern for right uncal herniation. 2. Additional focal area of restricted diffusion in the medial aspect of the right basal  ganglia, suspected to be in the preoptic area of the hypothalamus, likely sequela of aforementioned mass effect. 3. Hemorrhage layering in the occipital horns of the lateral ventricles, with redemonstrated effacement of the right lateral and third ventricle. No evidence of  hydrocephalus. 4. Small amount of subdural hemorrhage along the right frontal lobe, measuring up to 4 mm. 5. Filling defect in the distal right transverse sinus and proximal and mid right sigmoid sinus, consistent with thrombus, as seen on the same-day CTV.   LTM EEG 09/11/21 - Sharp waves,  right hemisphere, maximal right parieto-occipital region  - Continuous slow, generalized and lateralized right hemisphere     ASSESSMENT AND PLAN  52 y.o. 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  Patient did have subsequent seizures and brain edema but no seizures since leaving the hospital in February 2023.  She has been anticoagulated initially on heparin and DOAC and now off anticoagulation and on Aspirin alone. She is still complaining of severe headaches, at last visit we tried to switch her topiramate to amitriptyline but she has not done so.  Plan will be to discontinue topiramate and start patient on Elavil. For anxiety will also recommend Paxil, we had recommended that medication last visit in October but she has not started it.  I will also refer her to behavioral health or therapist. She still having trouble with insomnia, will start her on low-dose zolpidem.  She is on aspirin 81 mg daily and understand that this is lifelong. In terms of the seizures, she has not had any seizures since leaving the hospital in February 2023.  She still on Keppra 500 mg twice daily.  At next visit we will start the weaning process and discontinue Keppra if she has not had any additional seizures.   Follow-up in 6 months or sooner if worse. I also advised the patient to not sleep with a loaded gun.  Asked her to put the gun in her nightstand or somewhere close to her but not with her on the bed.  She voices understanding.    1. Chronic tension-type headache, not intractable   2. Cerebral venous sinus thrombosis   3. Seizure ([place])   4. Anxiety      Patient Instructions  Please decrease Topamax to 1/2 tablet nightly until you run out of medication then discontinue it.  Start Elavil 25 mg nightly for headache prevention  Start Paxil 10 mg daily for anxiety  Start Ambien 5 mg nightly for insomnia Continue Keppra for now, at next visit, will start the weaning process  Continue with Flexeril, Tylenol/Ibuprofen as needed  Continue Aspirin daily  Set up care with PCP  Referral to behavioral health Follow up in 6 months or sooner if worse   Orders Placed This Encounter  Procedures   Ambulatory referral to [facility] ordered this encounter  Medications   PARoxetine (PAXIL) 10 MG tablet    Sig: Take 1 tablet (10 mg total) by mouth daily.    Dispense:  30 tablet    Refill:  3    Please establish with PCP for further refills.   levETIRAcetam (KEPPRA) 500 MG tablet    Sig: Take 1 tablet (500 mg total) by mouth 2 (two) times daily.    Dispense:  180 tablet    Refill:  3   amitriptyline (ELAVIL) 25 MG tablet    Sig: TAKE 1 TABLET BY MOUTH EVERYDAY AT BEDTIME    Dispense:  90 tablet    Refill:  2   zolpidem (AMBIEN) 5 MG tablet    Sig: Take 1 tablet (5 mg  total) by mouth at bedtime as needed for sleep.    Dispense:  30 tablet    Refill:  0    Return in about 6 months (around 01/28/2023).  I have spent a total of 50 minutes dedicated to this patient today, preparing to see patient, performing a medically appropriate examination and evaluation, ordering tests and/or medications and procedures, and counseling and educating the patient/family/caregiver; independently  interpreting result and communicating results to the family/patient/caregiver; and documenting clinical information in the electronic medical record.   Alric Ran, MD 07/30/2022, 11:03 AM  [facility] [address], [place], [place] [address] [phone]

## 2022-08-06 ENCOUNTER — Telehealth: Payer: Self-pay | Admitting: Neurology

## 2022-08-06 NOTE — Telephone Encounter (Signed)
Pt is calling. Stated she is following up on her disability paper. Stated the company is calling her because they need to be submitted. Pt is requesting a call back from nurse.

## 2022-08-06 NOTE — Telephone Encounter (Signed)
I called pt,  not able to reach pt. Pt [employer] form faxed in november I will refax the form today thanks.

## 2022-08-15 ENCOUNTER — Ambulatory Visit: Payer: Self-pay | Admitting: Neurology

## 2022-09-05 ENCOUNTER — Ambulatory Visit (INDEPENDENT_AMBULATORY_CARE_PROVIDER_SITE_OTHER): Payer: Medicaid Other | Admitting: Clinical

## 2022-09-05 DIAGNOSIS — F0631 Mood disorder due to known physiological condition with depressive features: Secondary | ICD-10-CM | POA: Diagnosis not present

## 2022-09-05 DIAGNOSIS — F064 Anxiety disorder due to known physiological condition: Secondary | ICD-10-CM

## 2022-09-05 DIAGNOSIS — I619 Nontraumatic intracerebral hemorrhage, unspecified: Secondary | ICD-10-CM | POA: Diagnosis not present

## 2022-09-06 ENCOUNTER — Encounter (HOSPITAL_COMMUNITY): Payer: Self-pay | Admitting: Clinical

## 2022-09-06 ENCOUNTER — Encounter (HOSPITAL_COMMUNITY): Payer: Self-pay
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
## 2022-09-27 ENCOUNTER — Telehealth: Payer: Self-pay | Admitting: Neurology

## 2022-09-27 NOTE — Telephone Encounter (Signed)
Pt request refill for cyclobenzaprine (FLEXERIL) 10 MG tablet at  [facility] #[id number]

## 2022-09-30 MED ORDER — CYCLOBENZAPRINE HCL 10 MG PO TABS: 10.0000 mg | ORAL_TABLET | Freq: Three times a day (TID) | ORAL | 0 refills | Status: DC | PRN

## 2022-09-30 NOTE — Telephone Encounter (Signed)
Rx sent 

## 2022-10-03 ENCOUNTER — Ambulatory Visit (HOSPITAL_COMMUNITY): Payer: Medicaid Other | Admitting: Clinical

## 2022-10-08 ENCOUNTER — Other Ambulatory Visit: Payer: Self-pay | Admitting: Neurology

## 2022-10-09 ENCOUNTER — Other Ambulatory Visit: Payer: Self-pay

## 2022-10-09 ENCOUNTER — Encounter: Payer: Self-pay | Admitting: *Deleted

## 2022-10-09 MED ORDER — BUTALBITAL-ACETAMINOPHEN 50-325 MG PO TABS: 1.0000 | ORAL_TABLET | Freq: Two times a day (BID) | ORAL | 0 refills | Status: DC | PRN

## 2022-10-09 NOTE — Telephone Encounter (Signed)
Decline. Plan is for patient to discontinue Fioricet.

## 2022-10-09 NOTE — Telephone Encounter (Signed)
Pt wanted refill and I keep trying to send as cosign required but its not working routing to DR. April Manson

## 2022-10-17 ENCOUNTER — Encounter (HOSPITAL_COMMUNITY): Payer: Self-pay | Admitting: Clinical

## 2022-10-17 ENCOUNTER — Ambulatory Visit (INDEPENDENT_AMBULATORY_CARE_PROVIDER_SITE_OTHER): Payer: Medicaid Other | Admitting: Clinical

## 2022-10-17 DIAGNOSIS — F0631 Mood disorder due to known physiological condition with depressive features: Secondary | ICD-10-CM | POA: Diagnosis not present

## 2022-10-17 DIAGNOSIS — F064 Anxiety disorder due to known physiological condition: Secondary | ICD-10-CM

## 2022-10-17 DIAGNOSIS — D571 Sickle-cell disease without crisis: Secondary | ICD-10-CM | POA: Diagnosis not present

## 2022-10-17 DIAGNOSIS — S069X0D Unspecified intracranial injury without loss of consciousness, subsequent encounter: Secondary | ICD-10-CM
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
## 2022-10-31 ENCOUNTER — Encounter (HOSPITAL_COMMUNITY): Payer: Self-pay | Admitting: Clinical

## 2022-10-31 ENCOUNTER — Ambulatory Visit (INDEPENDENT_AMBULATORY_CARE_PROVIDER_SITE_OTHER): Payer: Medicaid Other | Admitting: Clinical

## 2022-10-31 DIAGNOSIS — F0631 Mood disorder due to known physiological condition with depressive features: Secondary | ICD-10-CM | POA: Diagnosis not present

## 2022-10-31 DIAGNOSIS — I619 Nontraumatic intracerebral hemorrhage, unspecified: Secondary | ICD-10-CM

## 2022-10-31 DIAGNOSIS — F064 Anxiety disorder due to known physiological condition: Secondary | ICD-10-CM
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
## 2022-11-07 ENCOUNTER — Other Ambulatory Visit: Payer: Self-pay | Admitting: Neurology

## 2022-11-07 MED ORDER — CYCLOBENZAPRINE HCL 10 MG PO TABS: 10.0000 mg | ORAL_TABLET | Freq: Three times a day (TID) | ORAL | 0 refills | Status: DC | PRN

## 2022-11-07 NOTE — Telephone Encounter (Signed)
Rx refilled.

## 2022-11-21 ENCOUNTER — Encounter (HOSPITAL_COMMUNITY): Payer: Self-pay | Admitting: Clinical

## 2022-11-21 ENCOUNTER — Ambulatory Visit (INDEPENDENT_AMBULATORY_CARE_PROVIDER_SITE_OTHER): Payer: Medicaid Other | Admitting: Clinical

## 2022-11-21 DIAGNOSIS — I619 Nontraumatic intracerebral hemorrhage, unspecified: Secondary | ICD-10-CM

## 2022-11-21 DIAGNOSIS — F064 Anxiety disorder due to known physiological condition: Secondary | ICD-10-CM

## 2022-11-21 DIAGNOSIS — F0631 Mood disorder due to known physiological condition with depressive features: Secondary | ICD-10-CM

## 2022-11-21 NOTE — Progress Notes (Signed)
THERAPIST PROGRESS NOTE  Session Time: 3:10-4:10pm  Participation Level: Active  Behavioral Response: Casual and Neat Alert Anxious and Depressed  Type of Therapy: Individual Therapy  Treatment Goals addressed:  LTG: April Reilly will score less than 5 on the Generalized Anxiety Disorder 7 Scale (GAD-7)    STG: April Reilly will practice problem solving skills 3 times per week for the next 4 weeks.    LTG: Reduce frequency, intensity, and duration of depression symptoms so that daily functioning is improved   STG: Reduce overall depression score by a minimum of 25% on the Patient Health Questionnaire (PHQ-9)   ProgressTowards Goals: Progressing  Interventions: Strength-based and Supportive  Summary: April Reilly is a 52 y.o. 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 She entered the session stating that things have been "horrible" and she has cried so much she cannot cry anymore.  Her boyfriend (off and on from age 85yo) had gone to [place] to sort of close out things in his life there, was going to come back here permanently in order for their relationship to be full-time.  She has been unable to contact him for months, finally found out after leaving her last session here that he passed away on September 05, 2022.  She was so upset, she has been isolating herself severely for 3, 4, and 5 days at a time, staying in the house and refusing to even go to the mailbox.  Even in the home, her daughters look at the cameras in the house and fuss at her for staying all day in the bed.  Finally, last Sunday ((4 days ago) her sister from [place] came to get her, kept her busy for 2 days.  Since her return she has been staying with her grandchildren to enable her daughter and son-in-law to take a trip.  She and her sister are going to [place] next month for a week, so she realizes these interactions and being forced out  of her isolation is healthy for her, despite being "annoying."  Her [employer] Security psychological evaluation is scheduled for Saturday 5/4 at 1pm.  She complained that the brain injury she has had is making her forgetful.  She was signing some paperwork earlier this week and kept having to ask for the date over and over again.  She was trying to write out her daughter's name but could not remember how to spell it.  She is still hearing noises such as TV and phone from the attic, but nobody else has heard them or found anything in the attic despite several searches.  She intends to go up to the attic with someone to see for herself.  We talked about the frequent need for reality-testing and acceptance that the brain is trying to trick [place].  She stated she is "trying to make myself not feel crazy."  To achieve this, she is making sure she is not alone, talking for hours to a former co-worker, telling herself she is not crazy, giving herself time to heal, helping with her grandson's first birthday.  She has not cooked since her last session, but she continues to boil water for her tea, pours it into her cup in the sink to avoid accidents.  She finally admitted that she has not been eating well since getting the news of her boyfriend's death, only 1 time daily most of the time, occasionally twice.  CSW emphasized the  negative effect on her body and on her brain injury if she does not provide nourishment.    Suicidal/Homicidal: No  Therapist Response: Patient is making progress AEB her statement that she feels so much better when she comes to therapy and can talk things out.  She stated specifically that once a week works for her.  CSW provided mood monitoring and treatment progress review in the context of this episode of treatment.   Patient reported that her mood has been "horrible".   Patient was able to explore the grief process with her, particularly the model of moving back and forth between grief and life  because staying in just one area is not sustainable.    CSW gave patient the opportunity to explore thoughts and feelings associated with current life situations and past/present external stressors as desired.   CSW encouraged patient's expression of feelings and validated patient's thoughts, using empathy, active listening, open body language, and unconditional positive regard.  Patient demonstrated an orientation to time, place, person and situation.      Recommendations:  Return to therapy in 1 week, engage in self care behaviors, continue journaling, start to focus on ensuring she is getting proper nutrition to keep herself strong and enable her brain to heal to the largest extent possible.  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 Patient/Guardian was advised if they have not already done so to contact the registration department to sign all necessary forms in order for [place] to release information regarding their care.   Consent: Patient/Guardian gives verbal consent for treatment and assignment of benefits for services provided during this visit. Patient/Guardian expressed understanding and agreed to proceed.   [name], LCSW 11/21/2022

## 2022-11-28 ENCOUNTER — Ambulatory Visit (INDEPENDENT_AMBULATORY_CARE_PROVIDER_SITE_OTHER): Payer: Medicaid Other | Admitting: Clinical

## 2022-11-28 ENCOUNTER — Encounter (HOSPITAL_COMMUNITY): Payer: Self-pay | Admitting: Clinical

## 2022-11-28 DIAGNOSIS — F064 Anxiety disorder due to known physiological condition: Secondary | ICD-10-CM

## 2022-11-28 DIAGNOSIS — F0631 Mood disorder due to known physiological condition with depressive features: Secondary | ICD-10-CM

## 2022-11-28 DIAGNOSIS — I619 Nontraumatic intracerebral hemorrhage, unspecified: Secondary | ICD-10-CM | POA: Diagnosis not present

## 2022-11-28 NOTE — Progress Notes (Signed)
THERAPIST PROGRESS NOTE  Session Time: 2:00pm-3:00pm  Session #5  Participation Level: Active  Behavioral Response: Casual and Neat Alert Anxious and Depressed  Type of Therapy: Individual Therapy  Treatment Goals addressed:  LTG: Cristin will score less than 5 on the Generalized Anxiety Disorder 7 Scale (GAD-7)    STG: Nelani will practice problem solving skills 3 times per week for the next 4 weeks.    LTG: Reduce frequency, intensity, and duration of depression symptoms so that daily functioning is improved   STG: Reduce overall depression score by a minimum of 25% on the Patient Health Questionnaire (PHQ-9)   ProgressTowards Goals: Progressing  Interventions: Solution Focused and Supportive  Summary: Azaleah Alarid is a 52 y.o. 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  She stated she went to her appointment with the psychologist from [employer] last Saturday and was quite agitated the entire time she talked about this meeting.  She told him she did not understand why she needed to talk to him because she already has a therapist whom she sees regularly.  This was something we talked about at her last session, but she apparently forgot.  She relayed many things they talked about and how she did not understand him, not only his [place] accent, but why he would say she was cheating on tests when she was trying to use her new adaptive methods to jog her memory.  For instance, she kept asking him if she had passed or failed, and she was very frustrated that he always responded that was not the goal of the testing.  He also would tell her a list of words to remember and she was angry that he would not repeat it several times so she could remember.  The way he insisted on doing it, she could only remember the last word, which was quite frustrating for her.  He told her his  report will be sent to Disability Determination Services in 2-4 weeks, which frustrates her a great deal.  CSW explained more about the purpose of the evaluation and that it sounds like she did really well in simply being herself and representing what she can remember and do for herself now and what she cannot.  She remained aggravated, asking why this psychologist could not have just explained it to her at the time.  She talked more today about the various deficits in her memory.  For instance, she has a wall in one room in her home that is dedicated to high heel shoes and she has a closet full of suits, about 5 of which still have price tags on them.  Her daughters have told her she used to get very dressed up, but she does not remember this at all.  She sees pictures of herself from back then and does not recognize herself.  There are times that she feels like she is out of her body watching herself.  She also stated she has been having more headaches recently and they can be so debilitating she stays in her darkened room all day.  She continues to grieve over the death of her boyfriend but is coping well.  She keeps hearing noises come from a window in her bedroom every night, but cannot find out what it is.  We talked about how she can approach the neighbor about a light the previous neighbor used to keep  on at night between the two homes.  Approaching people she does not know is anxiety-provoking, so she decided to approach the neighbor on the other side instead to see if they will intercede for her.  Patient did cook for herself without somebody in the home recently, which is not what we had agreed on.  She returned to the kitchen later to find it to be exceedingly hot, only to realize she left the burner on.  She does have a checklist she goes through every night before bed, and checking that the stove is off is on that list.  But this is of great concern.  Suicidal/Homicidal: No  Therapist  Response: Patient is making progress AEB her willingness to engage in exercises such as deep breathing to calm down.  It seems that during her psychological testing, that evaluator will have been able to see her memory deficits clearly from the way in which she describes the situation.  She was apparently quite demanding with him because she wanted to show she could do things better than what she was doing if he would only give her some concessions to do the testing her way.  That, in itself, likely showed him the reality of her inability to remember and do things as she did prior to her brain injury.  Patient reported she is eating 2 times a day only.    CSW provided mood monitoring and treatment progress review in the context of this episode of treatment.   Patient reported that her mood has been "better today".   CSW gave patient the opportunity to explore thoughts and feelings associated with current life situations and past/present external stressors as desired.   CSW encouraged patient's expression of feelings and validated patient's thoughts, using empathy, active listening, open body language, and unconditional positive regard.  Patient demonstrated an orientation to time, place, person and situation.       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 Patient/Guardian was advised if they have not already done so to contact the registration department to sign all necessary forms in order  for [place] to release information regarding their care.   Consent: Patient/Guardian gives verbal consent for treatment and assignment of benefits for services provided during this visit. Patient/Guardian expressed understanding and agreed to proceed.   [name], LCSW 11/28/2022

## 2022-12-05 ENCOUNTER — Ambulatory Visit (INDEPENDENT_AMBULATORY_CARE_PROVIDER_SITE_OTHER): Payer: Medicaid Other | Admitting: Clinical

## 2022-12-05 DIAGNOSIS — I619 Nontraumatic intracerebral hemorrhage, unspecified: Secondary | ICD-10-CM

## 2022-12-05 DIAGNOSIS — F064 Anxiety disorder due to known physiological condition: Secondary | ICD-10-CM

## 2022-12-05 DIAGNOSIS — F0631 Mood disorder due to known physiological condition with depressive features: Secondary | ICD-10-CM | POA: Diagnosis not present

## 2022-12-06 ENCOUNTER — Encounter (HOSPITAL_COMMUNITY): Payer: Self-pay | Admitting: Clinical

## 2022-12-06 NOTE — Progress Notes (Signed)
THERAPIST PROGRESS NOTE  Session Time: 4:05pm-5:04pm  Session #6  Participation Level: Active  Behavioral Response: Casual and Neat Alert Depressed and Irritable  Type of Therapy: Individual Therapy  Treatment Goals addressed:  LTG: Aarianna will score less than 5 on the Generalized Anxiety Disorder 7 Scale (GAD-7)    STG: Madyn will practice problem solving skills 3 times per week for the next 4 weeks.    LTG: Reduce frequency, intensity, and duration of depression symptoms so that daily functioning is improved   STG: Reduce overall depression score by a minimum of 25% on the Patient Health Questionnaire (PHQ-9)   ProgressTowards Goals: Progressing  Interventions: Solution Focused and Supportive  Summary: April Reilly is a 52 y.o. 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   She stated she has been upset 2 times in the last week but could not remember why, just had the impression of what happened.  One was at her youngest daughter who is making a lot of demands from her for her 1yo son's birthday party tomorrow, which patient feels is out of proportion for his age.  It is really about trying to impress the father's family.  She was encouraged by CSW to continue to maintain her boundaries with her daughter rather than to give in and get overwhelmed and/or angry.  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  She did have an appointment at one point here but that person did not take her insurance  She stated that when she was angry this week, she thought about sharing her thoughts and feelings with CSW and that was helpful.  We talked more about her regaining privacy in her home by either getting an alarm button  to replace the cameras, or having only one of her daughters looking at the cameras to check up on her.  CSW explained how the alarm buttons work.    The fact that she cannot remember things is highly upsetting to her still.  We talked about the anger iceberg and the various emotions that are under the anger.  She stated that confusion leads to most of her anger, with fear a close second.  She recalled once again how angry she was during the social security interview, and said it was because she was so confused.  Suicidal/Homicidal: No  Therapist Response: Patient is making progress AEB her ability to take suggestions and implement them, as well as her ability to talk about her feelings and feel comfortable with CSW's ideas.  CSW provided mood monitoring and treatment progress review in the context of this episode of treatment.   Patient reported that her mood has been "upset.   CSW gave patient the opportunity to explore thoughts and feelings associated with current life situations and past/present external stressors as desired.   CSW encouraged patient's expression of feelings and validated patient's thoughts, using empathy, active listening, open body language, and unconditional positive regard.  Patient demonstrated an orientation to time, place, person and situation.       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 Patient/Guardian was advised if they have not already done so to contact the registration  department to sign all necessary forms in order for [place] to release information regarding their care.   Consent: Patient/Guardian gives verbal consent for treatment and assignment of benefits for services provided during this visit. Patient/Guardian expressed understanding and agreed to proceed.   [name], LCSW 12/06/2022

## 2022-12-19 ENCOUNTER — Ambulatory Visit (INDEPENDENT_AMBULATORY_CARE_PROVIDER_SITE_OTHER): Payer: Medicaid Other | Admitting: Clinical

## 2022-12-19 ENCOUNTER — Encounter (HOSPITAL_COMMUNITY): Payer: Self-pay | Admitting: Clinical

## 2022-12-19 DIAGNOSIS — F064 Anxiety disorder due to known physiological condition: Secondary | ICD-10-CM | POA: Diagnosis not present

## 2022-12-19 DIAGNOSIS — F0631 Mood disorder due to known physiological condition with depressive features: Secondary | ICD-10-CM | POA: Diagnosis not present

## 2022-12-19 DIAGNOSIS — I619 Nontraumatic intracerebral hemorrhage, unspecified: Secondary | ICD-10-CM

## 2022-12-19 NOTE — Progress Notes (Signed)
THERAPIST PROGRESS NOTE  Session Time: 4:03-5:03pm  Session #7  Participation Level: Active  Behavioral Response: Casual and Neat Alert Negative and Anxious  Type of Therapy: Individual Therapy  Treatment Goals addressed:  LTG: Nijaya will score less than 5 on the Generalized Anxiety Disorder 7 Scale (GAD-7)    STG: Cora will practice problem solving skills 3 times per week for the next 4 weeks.    LTG: Reduce frequency, intensity, and duration of depression symptoms so that daily functioning is improved   STG: Reduce overall depression score by a minimum of 25% on the Patient Health Questionnaire (PHQ-9)   ProgressTowards Goals: Progressing  Interventions: CBT, Supportive, and Social Skills Training  Summary: Trenace Sessoms is a 52 y.o. 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   She reported her mood has been both good and bad.  She was pleased to report that she has been going to bed around 3am rather than her typical 6am.  She has a specific routine to accomplish sleep at this early time, involves wall pilates, a shower, drinking coconut water, setting her tv to shut off.  Sometimes when she gets up at 8am, she will go back to bed for an hour or so.  She "cased" her home and found out that the noise at her window was being caused by squirrels running around, which made her feel better and safer.  She talked about upsets with her youngest daughter because her son's first birthday party did not go well due to the rain and with her middle daughter who drank too much at one family gathering and acted inappropriately, was loud and blaming to patient and other family members.  Whether it is her sickle cell disease acting up because she is so stressed, or she is so stressed because her sickle cell is causing pain, she is concerned about it.  Most of the session was spent in talking about her  feelings toward the doctor who administered the wrong medicine to her and caused all the problems with her loss of 30% of brain function, memory issues, inability to return to work, and such.  She stated she often asks herself what she would do if she saw that doctor somewhere.  She postulated that she would slap the doctor, but decided that it is not something that would represent who she is.  She then wondered if she would remember and have the courage to say to the doctor that she feels robbed and that the doctor took her life away from her.  On the other hand, she often wants to ask the doctor what happened and how this mistake was even made.  She continues to ask herself what she did wrong, was open to CSW's ongoing reassurances that it was not about her, but then did start to question even that.  CSW explained that one of the cognitive distortions we often have is the Public relations account executive. She verbalized understanding.  She was very focused on how much of a problem her memories are causing.  She wonders where she would be in her life if this had not happened.  Not recognizing people that she knows well is a particular hardship of the current mental status.  For instance, she saw her sister-in-law of many years and almost screamed out of lack of recognition.    Suicidal/Homicidal: No  Therapist Response: Patient is making progress AEB  her ongoing participation in therapy and willingness to explore an alternate way of looking at things.  She does report she is still hearing voices.  CSW gave patient the opportunity to explore thoughts and feelings associated with current life situations and past/present external stressors as desired.   CSW encouraged patient's expression of feelings and validated patient's thoughts, using empathy, active listening, open body language, and unconditional positive regard.  Patient demonstrated an orientation to time, place, person and situation.       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 Patient/Guardian was advised if they have not already done so to contact the registration department to sign all necessary forms in order for [place] to release information regarding their care.   Consent: Patient/Guardian gives verbal consent for treatment and assignment of benefits for services provided during this visit. Patient/Guardian expressed understanding and agreed to proceed.   [name], LCSW 12/19/2022

## 2023-01-01 IMAGING — CT CT VENOGRAM HEAD
3 of 11 series · 17 of 47 positions shown · IV contrast (OMNIPAQUE)
Comparison: Prior CT from 09/17/2021.

CLINICAL DATA: Follow-up examination for dural sinus thrombosis.

EXAM:
CT VENOGRAM HEAD
TECHNIQUE: Venographic phase images of the brain were obtained following the
administration of intravenous contrast. Multiplanar reformats and
maximum intensity projections were generated.

[Series 3: head bone · axial · 0.44mm/px · z∈[+1647,+1679]mm · 2 of 79 slices shown]
[im 16/79  bone]
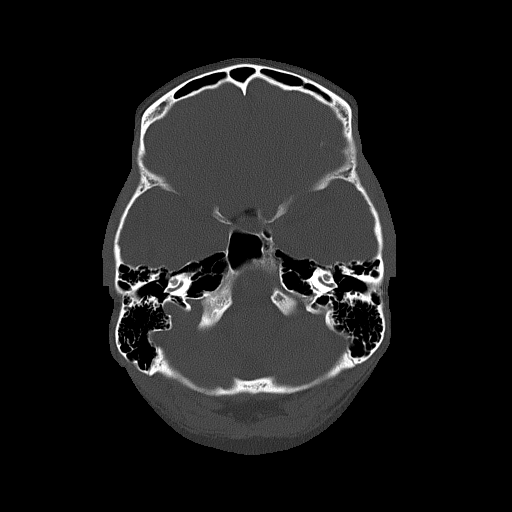
[im 32/79  bone]
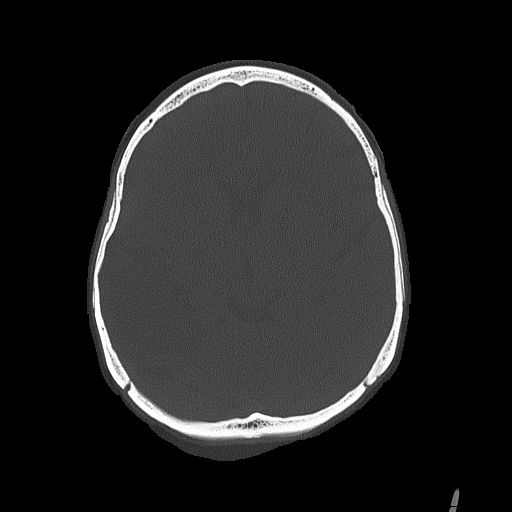

[Series 6: head venogram · axial · 0.44mm/px · z∈[+1648,+1742]mm · 4 of 79 slices shown]
[im 16/79  brain]
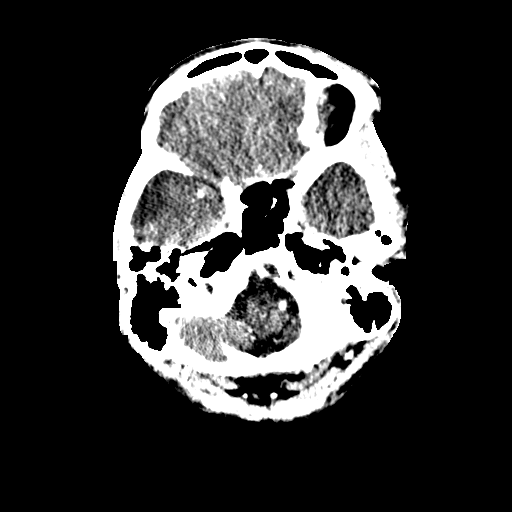
[im 32/79  brain]
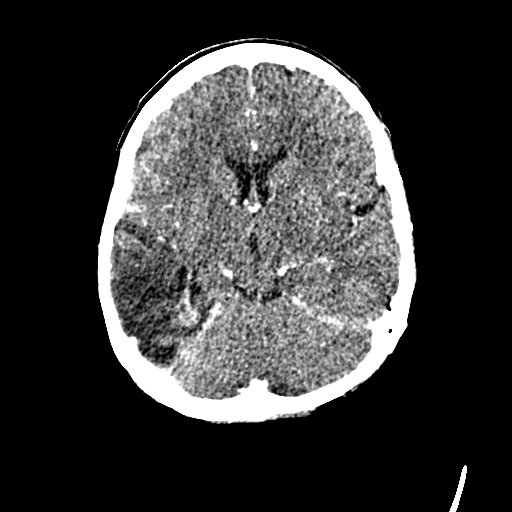
[im 47/79  brain]
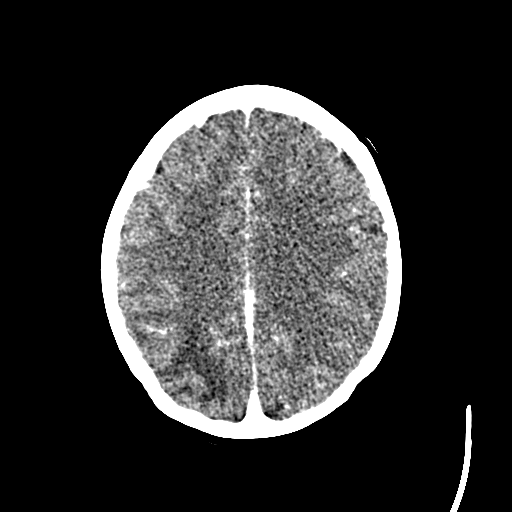
[im 63/79  brain]
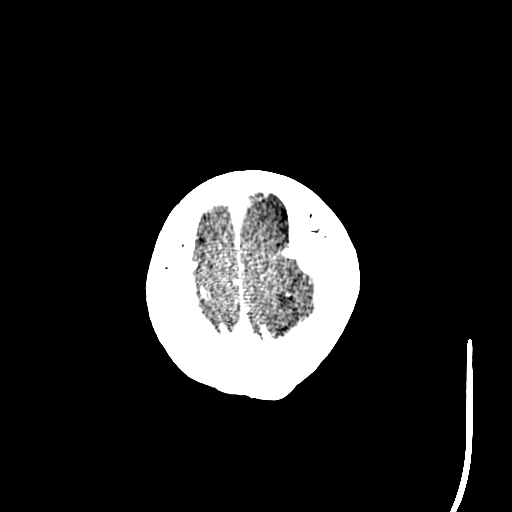

[Series 10: ax thin · axial · 0.33mm/px · z∈[+1559,+1691]mm · 11 of 169 slices shown]
[im 15/169  brain]
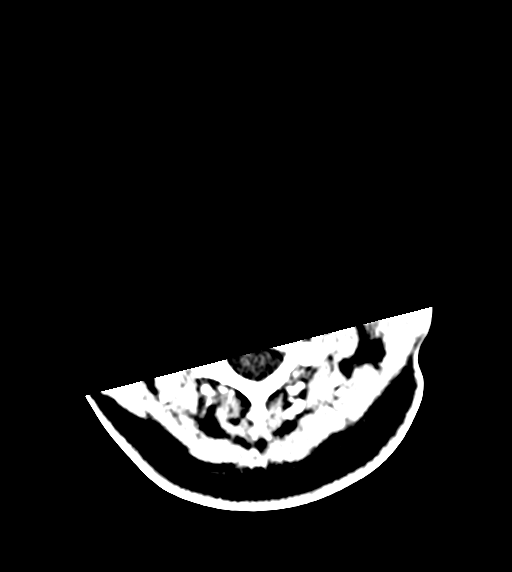
[im 29/169  bone]
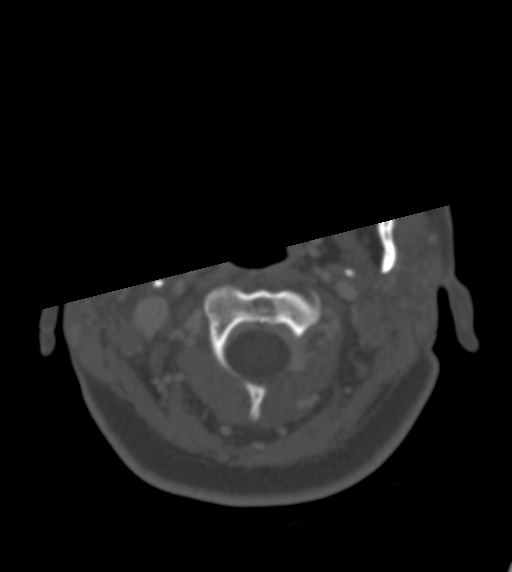
[im 43/169  brain]
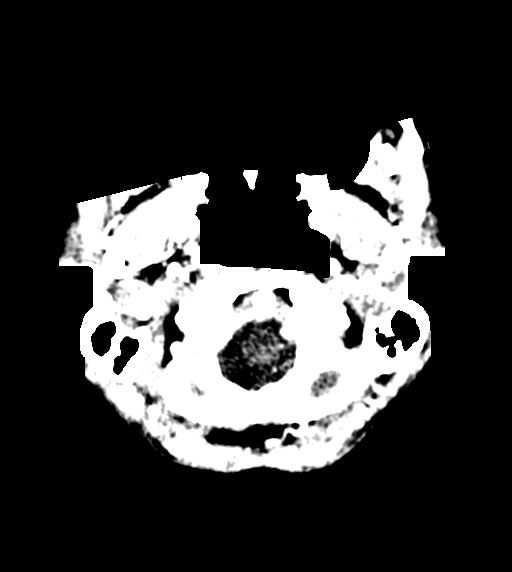
[im 57/169  bone]
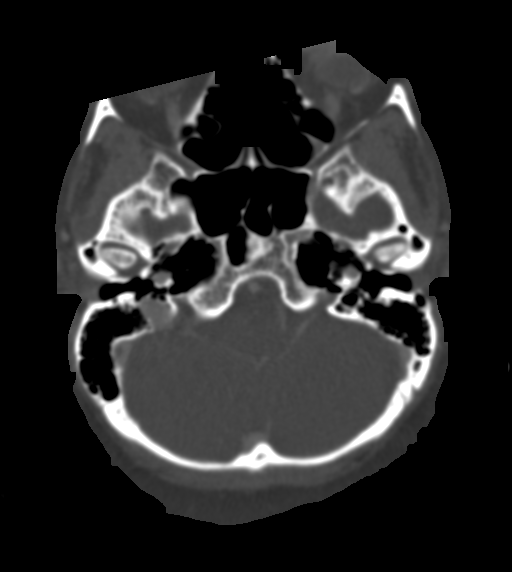
[im 71/169  brain]
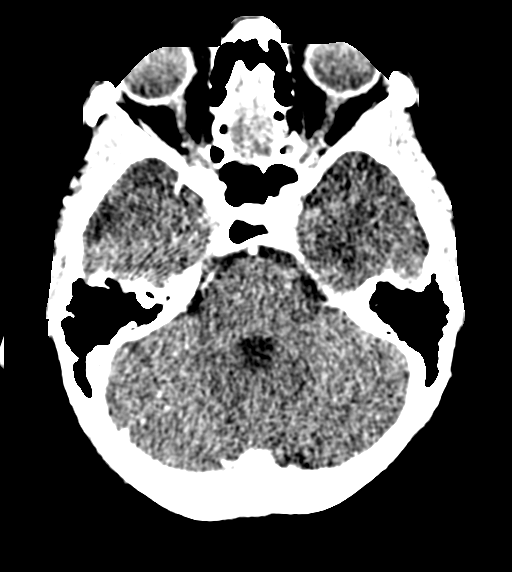
[im 85/169  bone]
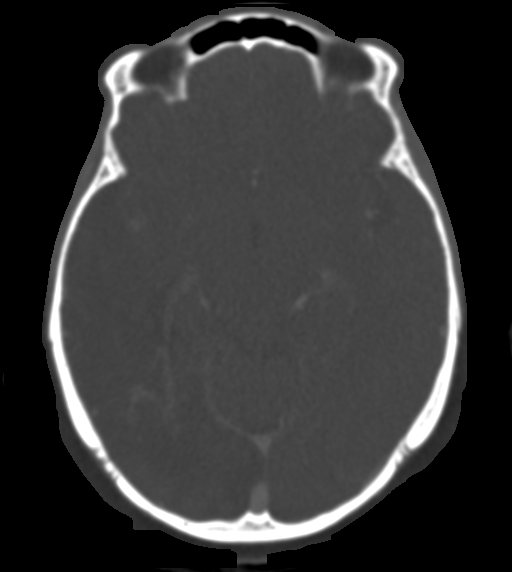
[im 99/169  brain]
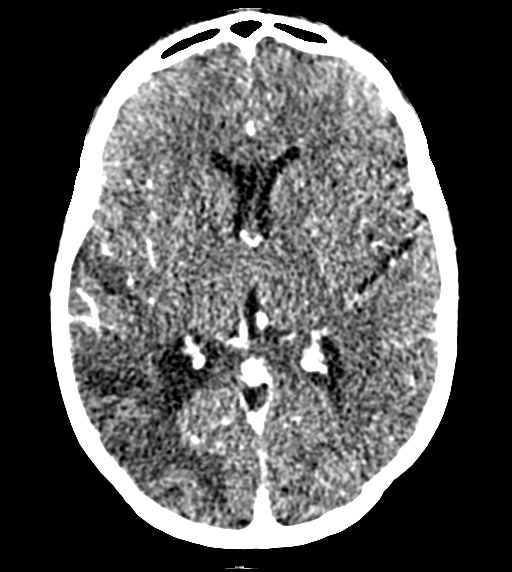
[im 113/169  bone]
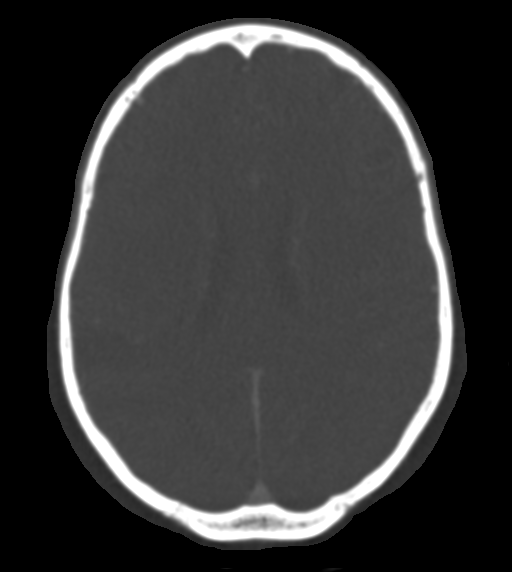
[im 127/169  brain]
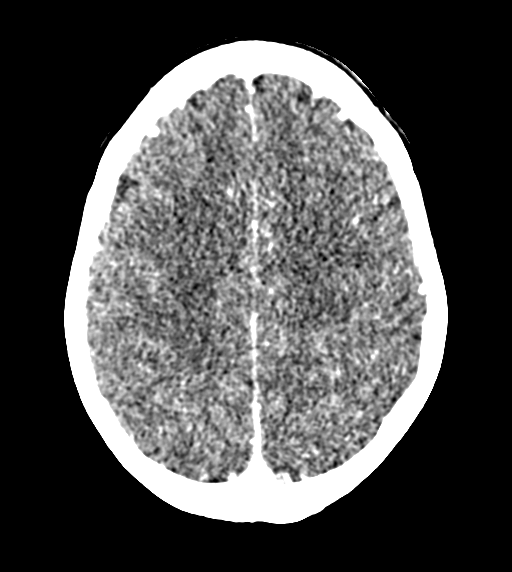
[im 141/169  bone]
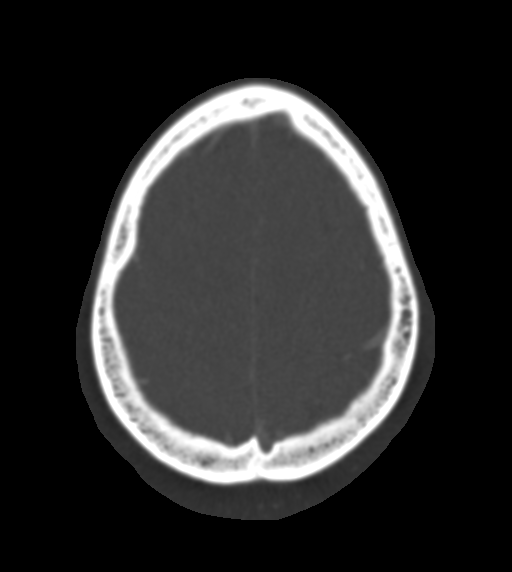
[im 155/169  brain]
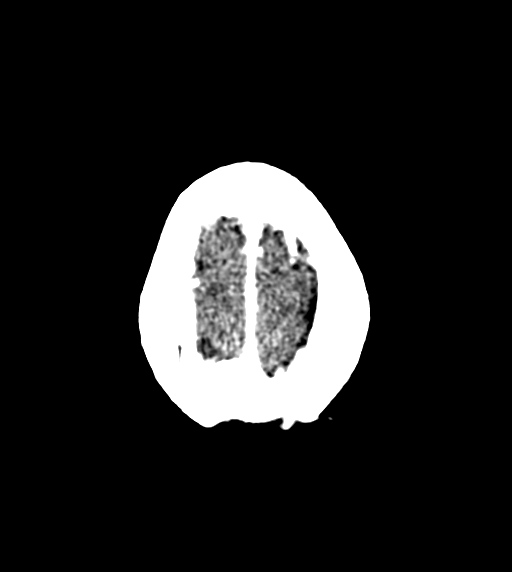

[17 of 47 positions shown; findings below may reference images not displayed]

RADIATION DOSE REDUCTION: This exam was performed according to the
departmental dose-optimization program which includes automated
exposure control, adjustment of the mA and/or kV according to
patient size and/or use of iterative reconstruction technique.

CONTRAST:  100mL OMNIPAQUE IOHEXOL 350 MG/ML SOLN
FINDINGS: Brain: There has been interval evolution of previously identified
hemorrhagic venous infarction involving the posterior right cerebral
hemisphere, now chronic in appearance. Previously seen blood
products have resolved. No new intracranial hemorrhage. No other
acute large vessel territory infarct. No mass lesion, mass effect or
midline shift. No hydrocephalus or extra-axial fluid collection.

Vascular: No abnormal hyperdense vessel seen prior to contrast
administration. Following contrast administration normal enhancement
seen throughout the superior sagittal sinus to the torcula. Left
transverse and sigmoid sinus is are widely patent as is the
visualized proximal left internal jugular vein. Straight sinus, vein
of Jeremey, internal cerebral veins, and basal veins of Bastiaans are
patent. Right transverse sinus patent proximally. There is residual
nonocclusive thrombus involving the distal right transverse and
sigmoid sinuses, somewhat improved in appearance as compared to
previous exam (series 8, images 144-137). Right jugular bulb and
proximal right internal jugular vein appear patent. No new dural
sinus thrombosis. No appreciable cortical vein thrombosis.

Skull: Scalp soft tissues demonstrate no acute finding. Calvarium
intact. No focal osseous lesions.

Sinuses/Orbits: Globes and orbital soft tissues within normal
limits.

Visualized paranasal sinuses are clear. No mastoid effusion.
IMPRESSION: 1. Sequelae of prior hemorrhagic venous infarction involving the
posterior right cerebral hemisphere, now chronic in appearance.
Previously seen blood products have resolved.
2. Residual nonocclusive thrombus involving the distal right
transverse and sigmoid sinuses, persistent but somewhat improved in
appearance as compared to previous exam. No new dural sinus
thrombosis.
3. No other new acute intracranial abnormality.

## 2023-01-22 ENCOUNTER — Ambulatory Visit (INDEPENDENT_AMBULATORY_CARE_PROVIDER_SITE_OTHER): Payer: Medicaid Other | Admitting: Clinical

## 2023-01-22 DIAGNOSIS — F0631 Mood disorder due to known physiological condition with depressive features: Secondary | ICD-10-CM

## 2023-01-22 DIAGNOSIS — F064 Anxiety disorder due to known physiological condition: Secondary | ICD-10-CM

## 2023-01-22 DIAGNOSIS — I619 Nontraumatic intracerebral hemorrhage, unspecified: Secondary | ICD-10-CM | POA: Diagnosis not present

## 2023-01-22 NOTE — Progress Notes (Unsigned)
THERAPIST PROGRESS NOTE  Session Time: 4:03-5:03pm  Session #7  Participation Level: Active  Behavioral Response: Casual and Neat Alert Negative and Anxious  Type of Therapy: Individual Therapy  Treatment Goals addressed:  LTG: Nijaya will score less than 5 on the Generalized Anxiety Disorder 7 Scale (GAD-7)    STG: Cora will practice problem solving skills 3 times per week for the next 4 weeks.    LTG: Reduce frequency, intensity, and duration of depression symptoms so that daily functioning is improved   STG: Reduce overall depression score by a minimum of 25% on the Patient Health Questionnaire (PHQ-9)   ProgressTowards Goals: Progressing  Interventions: CBT, Supportive, and Social Skills Training  Summary: Trenace Sessoms is a 52 y.o. 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   She reported her mood has been both good and bad.  She was pleased to report that she has been going to bed around 3am rather than her typical 6am.  She has a specific routine to accomplish sleep at this early time, involves wall pilates, a shower, drinking coconut water, setting her tv to shut off.  Sometimes when she gets up at 8am, she will go back to bed for an hour or so.  She "cased" her home and found out that the noise at her window was being caused by squirrels running around, which made her feel better and safer.  She talked about upsets with her youngest daughter because her son's first birthday party did not go well due to the rain and with her middle daughter who drank too much at one family gathering and acted inappropriately, was loud and blaming to patient and other family members.  Whether it is her sickle cell disease acting up because she is so stressed, or she is so stressed because her sickle cell is causing pain, she is concerned about it.  Most of the session was spent in talking about her  feelings toward the doctor who administered the wrong medicine to her and caused all the problems with her loss of 30% of brain function, memory issues, inability to return to work, and such.  She stated she often asks herself what she would do if she saw that doctor somewhere.  She postulated that she would slap the doctor, but decided that it is not something that would represent who she is.  She then wondered if she would remember and have the courage to say to the doctor that she feels robbed and that the doctor took her life away from her.  On the other hand, she often wants to ask the doctor what happened and how this mistake was even made.  She continues to ask herself what she did wrong, was open to CSW's ongoing reassurances that it was not about her, but then did start to question even that.  CSW explained that one of the cognitive distortions we often have is the Public relations account executive. She verbalized understanding.  She was very focused on how much of a problem her memories are causing.  She wonders where she would be in her life if this had not happened.  Not recognizing people that she knows well is a particular hardship of the current mental status.  For instance, she saw her sister-in-law of many years and almost screamed out of lack of recognition.    Suicidal/Homicidal: No  Therapist Response: Patient is making progress AEB  her ongoing participation in therapy and willingness to explore an alternate way of looking at things.  She does report she is still hearing voices.  CSW gave patient the opportunity to explore thoughts and feelings associated with current life situations and past/present external stressors as desired.   CSW encouraged patient's expression of feelings and validated patient's thoughts, using empathy, active listening, open body language, and unconditional positive regard.  Patient demonstrated an orientation to time, place, person and situation.       Recommendations:  Return  to therapy in 2 weeks, engage in self care behaviors, continue to work on sleep hygiene and shoot for 7-8 hours of sleep nightly, consider seeing psychiatrist since still hearing voices  Plan: Return again in 2 weeks Next appointment: 7/3  Diagnosis:  No diagnosis found.  Collaboration of Care: Psychiatrist AEB - recommended that patient see psychiatric provider because voices are continuing and Other provider involved in patient's care AEB - neurologist has access to notes in Epic  Patient/Guardian was advised Release of Information must be obtained prior to any record release in order to collaborate their care with an outside provider. Patient/Guardian was advised if they have not already done so to contact the registration department to sign all necessary forms in order for [place] to release information regarding their care.   Consent: Patient/Guardian gives verbal consent for treatment and assignment of benefits for services provided during this visit. Patient/Guardian expressed understanding and agreed to proceed.   [name], LCSW 01/22/2023

## 2023-01-23 ENCOUNTER — Encounter (HOSPITAL_COMMUNITY): Payer: Self-pay | Admitting: Clinical

## 2023-01-28 ENCOUNTER — Ambulatory Visit: Payer: Medicaid Other | Admitting: Neurology

## 2023-01-28 ENCOUNTER — Other Ambulatory Visit: Payer: Self-pay | Admitting: Neurology

## 2023-01-28 ENCOUNTER — Encounter: Payer: Self-pay | Admitting: Neurology

## 2023-01-28 VITALS — BP 114/68 | HR 109 | Ht 60.0 in | Wt 168.0 lb

## 2023-01-28 DIAGNOSIS — G08 Intracranial and intraspinal phlebitis and thrombophlebitis: Secondary | ICD-10-CM

## 2023-01-28 DIAGNOSIS — H5340 Unspecified visual field defects: Secondary | ICD-10-CM

## 2023-01-28 DIAGNOSIS — G44229 Chronic tension-type headache, not intractable: Secondary | ICD-10-CM | POA: Diagnosis not present

## 2023-01-28 DIAGNOSIS — R569 Unspecified convulsions: Secondary | ICD-10-CM | POA: Diagnosis not present

## 2023-01-28 DIAGNOSIS — G936 Cerebral edema: Secondary | ICD-10-CM

## 2023-01-28 MED ORDER — NURTEC 75 MG PO TBDP: 75.0000 mg | ORAL_TABLET | ORAL | 11 refills | Status: DC

## 2023-01-28 NOTE — Progress Notes (Signed)
[facility]  PATIENT: April Reilly DOB: Sep 04, 1970  REQUESTING CLINICIAN: No ref. provider found HISTORY FROM: Patient REASON FOR VISIT: Venous thrombosis follow up   HISTORICAL  CHIEF COMPLAINT:  Chief Complaint  Patient presents with   Follow-up    Rm 12, alone, unsure if had seizures since visit but woke up and bed was disheveled and she normally sleeps in one spot, anxiety is "through the roof" per patient, migraines are bad, almost daily migraines. New onset of tremors in both hands, she has burned herself and also cut herself from dropping a knife. Occasional SI but does have HI, she discusses with her therapists.    INTERVAL HISTORY 01/28/2023:  Patient presents today for follow-up, she is alone.  Last visit was in January and since then she has travelled to [place] and saw her PCP, had workup which was unrevealing.  She reports compliance with her medications, denies any seizure or seizure like activity.  She remains on the Keppra 500 mg twice daily.  She continues to complain of headaches, chronic headaches.  She has tried topiramate and amitriptyline without any benefit.  She continues on Motrin as needed for the headaches.  Reports that her headaches are almost daily.  She still reports anxiety, feels like her anxiety is worse when she is home alone by herself, sometimes she hears a phone ring when no phone is ringing.  She reports a few weeks ago her sister came and stay with her and during that time, she did not have any hallucinations, did not have any symptom of anxiety.  While she was in [place] also visiting family, she did not experience any hallucinations and no worsening anxiety while in the house but she will get anxious when she is in the crowd in public.    INTERVAL HISTORY 07/30/2022:  Patient presents today for follow-up, at last visit in October plan was to discontinue Topamax, start Elavil and Paxil.  Patient has not started those medications  and she is still on Topamax.  She still complains of severe headaches about 2-3 times per week.  When she had these bad headaches, she takes muscle relaxant.  She also reports increase in anxiety, trouble falling asleep, patient's reported she is scare to sleep because she lives alone.  Lately she has been sleeping with a loaded gun on her bed. She still has not set up care with a primary care physician   INTERVAL HISTORY 05/07/2022:  April Reilly presents today for follow-up, she is alone.  Since last visit she had an episode of severe vaginal bleeding, her Xarelto was discontinued.  Since she has been anticoagulated for 8 months,  it was reasonable to discontinue Xarelto.  She is presenting today complaining of headache, at last visit we had increased her Topamax to 100 mg nightly but she still reports headache that usually start in the back of the neck.  She reported Flexeril helps but she ran out of medication.  She also tried Fioricet which helped her.  She denies any migrainous features, denies any nausea or vomiting.  She reported she is very anxious and worry about her brain injury, she cannot remember her seizure and worried about having another seizure.  Her vision is still affected, and wonder if she will get her vision back. She has not seen oph due to lack of insurance.    INTERVAL HISTORY 03/12/2022:  Patient presents today for follow-up, last visit was in June at that time plan was to  continue with Eliquis and obtain a repeat CT venogram.  She reported she ran out of Eliquis and actually her insurance dropped her because of FMLA.  Luckily she did follow-up with Dr. Al Pimple, hematologist and patient was started on Xarelto.  She did have her CT venogram yesterday but pending official read.  She reports that she still having occasional headaches that is relieved by muscle relaxant, her vision is still poor and she is pending a ophthalmology follow-up next week.  Denies any recent fall.  Denies any new  focal weakness.     INTERVAL HISTORY 12/24/21:  Patient presents today for follow-up, her CT venogram was repeated and it showed residual nonocclusive thrombus involving the distal right transverse and sigmoid sinus sinuses.,  Persistent but somewhat improved in appearance as compared to previous exam.  No new dural sinus thrombosis.  She remains on Eliquis 5 mg twice daily.  She did follow-up with primary care doctor and hematology.  She is pending a complete hematology work-up.  Currently she is complaining of left shoulder arm pain, some migraine headaches.  She ran out of some of her medications more than a month ago and have not refill it.    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  She was initiated on anticoagulation.  Neurosurgery was consulted but patient did not have a craniectomy.  She developed seizures, started on Keppra  Her symptoms improve, she was discharge to acute rehab and her plan was to continue with anticoagulation for at least 6 months.  Patient report prior to her hospitalization she has been doing well, she has chronic anemia, sickle cell disease but she was not taken on any medication.  She reports a few weeks prior to presentation she developed vaginal bleeding and she was started on megestrol acetate.  Since discharge from the hospital, daughter reported improvement of her mental status, confusion improved, she does continue with physical therapy and she is getting more independent.  She is compliant with all medications.   Patient reports prior to her hospitalization she was not taking any medication but since discharge she is on 13 medications, she feels like she does not need all of them.   Hospital course below. April Reilly is a 52 y.o. female with history of SS trait, uterine fibroid, abnormal vaginal bleeding x4 months who was admitted on 09/10/2021 with 3-day history of malaise, fall and progressive somnolence.  She was found to have RLL aspiration pneumonia as well as IPH due to right hemorrhagic infarct with edema and right frontal, parietal, occipital and temporal lobes and 9 mm right to left midline shift.  She was started on hypertonic saline and EEG done showing evidence of epileptogenicity with high potential for seizures.  Work-up also revealed subocclusive clot in right distal right transverse sinus and right sigmoid dural venous sinus and she was started on IV heparin.  Dr. Roda Shutters felt that patient with large right frontoparietal venous infarct with hemorrhagic conversion due to unclear etiology.  Neurosurgery was consulted due to concerns about the herniation and recommended  conservative care with serial CCT for monitoring. She was loaded with Dilantin due to concerns of seizure and placed on long-term EEG.  Dilantin was discontinued and Keppra added on 02/26.  Hypertonic saline was weaned off and mentation was slowly improving.  Repeat CTA/V of 02/27 showed evolving hemorrhagic infarct with mild improvement in mass effect and improved nonocclusive thrombus in right distal transverse and sigmoid sinus.  She was transition to DOAC on 02/28 with neurology recommending 3 to 6 months of AC as well as close neurological follow-up.  Hospital course was significant for issues with headaches, hypotension, tachycardia, left-sided weakness with balance deficits as well as cognitive deficits and disorientation.  CIR was recommended due to functional decline.    OTHER MEDICAL CONDITIONS: SS Trait, anemia    REVIEW OF  SYSTEMS: Full 14 system review of systems performed and negative with exception of: as noted in the HPI   ALLERGIES: Allergies  Allergen Reactions   Megestrol Other (See Comments) and Swelling    Caused blood clot in brain Caused blood clot in brain    HOME MEDICATIONS: Outpatient Medications Prior to Visit  Medication Sig Dispense Refill   amitriptyline (ELAVIL) 25 MG tablet TAKE 1 TABLET BY MOUTH EVERYDAY AT BEDTIME 90 tablet 2   aspirin EC 81 MG tablet Take 81 mg by mouth daily. Swallow whole.     cyclobenzaprine (FLEXERIL) 10 MG tablet Take 1 tablet (10 mg total) by mouth 3 (three) times daily as needed for muscle spasms. 30 tablet 0   levETIRAcetam (KEPPRA) 500 MG tablet Take 1 tablet (500 mg total) by mouth 2 (two) times daily. 180 tablet 3   PARoxetine (PAXIL) 10 MG tablet Take 1 tablet (10 mg total) by mouth daily. 30 tablet 3   diclofenac Sodium (VOLTAREN) 1 % GEL Place onto the skin. (Patient not taking: Reported on 01/28/2023)     zolpidem (AMBIEN) 5 MG tablet Take 1 tablet (5 mg total) by mouth at bedtime as needed for sleep. (Patient not taking: Reported on 01/28/2023) 30 tablet 0   No facility-administered medications prior to visit.    PAST MEDICAL HISTORY: Past Medical History:  Diagnosis Date   Asthma    Stroke (HCC)    Vaginal bleeding, abnormal    for 4 months    PAST SURGICAL HISTORY: Past Surgical History:  Procedure Laterality Date   FOOT SURGERY     TUBAL LIGATION      FAMILY HISTORY: Family History  Problem Relation Age of Onset   Clotting disorder Mother     SOCIAL HISTORY: Social History   Socioeconomic History   Marital status: Single    Spouse name: Not on file   Number of children: Not on file   Years of education: Not on file   Highest education level: Not on file  Occupational History   Not on file  Tobacco Use   Smoking status: Never   Smokeless tobacco: Never  Substance and Sexual Activity   Alcohol use: Not Currently   Drug  use: Never   Sexual activity: Not Currently  Other Topics Concern   Not on file  Social History Narrative   Right handed   Caffeine ocassional   Lives alone with dog   Social Determinants of Health   Financial Resource Strain: Not on file  Food Insecurity: Not on file  Transportation Needs: Not on file  Physical Activity: Not on file  Stress: Not on file  Social Connections: Not on file  Intimate Partner  Violence: Not on file    PHYSICAL EXAM  GENERAL EXAM/CONSTITUTIONAL: Vitals:  Vitals:   01/28/23 1005  BP: 114/68  Pulse: (!) 109  SpO2: 96%  Weight: 168 lb (76.2 kg)  Height: 5' (1.524 m)    Body mass index is 32.81 kg/m. Wt Readings from Last 3 Encounters:  01/28/23 168 lb (76.2 kg)  07/30/22 158 lb (71.7 kg)  06/20/22 158 lb 8 oz (71.9 kg)   Patient is in no distress; well developed, nourished and groomed; neck is supple  EYES: Pupils round and reactive to light, there is left visual field cut, Extraocular movements intacts,   MUSCULOSKELETAL: Gait, strength, tone, movements noted in Neurologic exam below  NEUROLOGIC: MENTAL STATUS:      No data to display         awake, alert, oriented to person, place and time recent and remote memory intact normal attention and concentration language fluent, comprehension intact, naming intact fund of knowledge appropriate  CRANIAL NERVE:  2nd, 3rd, 4th, 6th - Left visual field cut, extraocular muscles intact, no nystagmus 5th - facial sensation symmetric 7th - facial strength symmetric 8th - hearing intact 9th - palate elevates symmetrically, uvula midline 11th - shoulder shrug symmetric 12th - tongue protrusion midline  MOTOR:  normal bulk and tone, full strength in the BUE, BLE  SENSORY:  normal and symmetric to light touch  COORDINATION:  finger-nose-finger, fine finger movements normal   GAIT/STATION:  normal   DIAGNOSTIC DATA (LABS, IMAGING, TESTING) - I reviewed patient records, labs,  notes, testing and imaging myself where available.  Lab Results  Component Value Date   WBC 4.2 10/19/2021   HGB 12.5 10/19/2021   HCT 39.8 10/19/2021   MCV 78.7 (L) 10/19/2021   PLT 253 10/19/2021      Component Value Date/Time   NA 137 10/19/2021 1250   K 3.9 10/19/2021 1250   CL 106 10/19/2021 1250   CO2 23 10/19/2021 1250   GLUCOSE 92 10/19/2021 1250   BUN <5 (L) 10/19/2021 1250   CREATININE 0.76 10/19/2021 1250   CALCIUM 9.3 10/19/2021 1250   PROT 6.8 09/20/2021 0544   ALBUMIN 3.0 (L) 09/20/2021 0544   AST 53 (H) 09/20/2021 0544   ALT 80 (H) 09/20/2021 0544   ALKPHOS 83 09/20/2021 0544   BILITOT 0.3 09/20/2021 0544   GFRNONAA >60 10/19/2021 1250   Lab Results  Component Value Date   CHOL 139 09/11/2021   HDL 46 09/11/2021   LDLCALC 84 09/11/2021   TRIG 47 09/11/2021   CHOLHDL 3.0 09/11/2021   Lab Results  Component Value Date   HGBA1C 5.3 09/11/2021   Lab Results  Component Value Date   VITAMINB12 364 09/11/2021   Lab Results  Component Value Date   TSH 0.164 (L) 09/11/2021    CT Venogram 11/28/2021 1. 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 Previously seen blood products have resolved. 2. Residual nonocclusive thrombus involving the distal right transverse and sigmoid sinuses, persistent but somewhat improved in appearance as compared to previous exam. No new dural sinus thrombosis. 3. No other new acute intracranial abnormality.  CT Venogram 09/17/21. Evolving hemorrhagic infarction primarily involving temporal and occipital lobes with extension into the parietal lobe. No new hemorrhage. Mass effect is stable to mildly improved. Persistent but improved nonocclusive thrombus within the right distal transverse and sigmoid sinuses   Head CT 09/10/21 Large region of cortical/subcortical edema within the right frontal,  parietal, occipital and temporal lobes, as well as  right insula and subinsular region. Superimposed patchy foci of acute parenchymal hemorrhage within the right parietal, occipital and temporal lobes. These findings are favored to reflect an acute/early subacute infarct from arterial ischemia (with hemorrhagic conversion) or a hemorrhagic infarct due to intracranial venous thrombosis. An MRI of the brain with contrast (including thin-slice T1-weighted post-contrast imaging for assessment of the intracranial venous structures) is recommended for further evaluation. A CTA of the head/neck should also be considered.   Associated mass effect with significant partial effacement of the right lateral ventricle and 9 mm leftward midline shift. The basal cisterns are effaced. Neurosurgical consultation is advised.   Moderate-volume extension of hemorrhage into the right lateral ventricle is questioned. No definite evidence of hydrocephalus or ventricular entrapment at this time   MRI Brain 09/10/21 1. Large area of cortical infarction in the right temporal, occipital, parietal, and posterior frontal lobes, with significant associated edema and redemonstrated intraparenchymal hemorrhage. The mass effect causes 11 mm of right-to-left midline shift, medial displacement of the right uncus and right temporal horn and effacement of the basal cisterns, unchanged from the prior CT and raising concern for right uncal herniation. 2. Additional focal area of restricted diffusion in the medial aspect of the right basal ganglia, suspected to be in the preoptic area of the hypothalamus, likely sequela of aforementioned mass effect. 3. Hemorrhage layering in the occipital horns of the lateral ventricles, with redemonstrated effacement of the right lateral and third ventricle. No evidence of hydrocephalus. 4. Small amount of subdural hemorrhage along the right frontal lobe, measuring up to 4 mm. 5. Filling defect in the distal right transverse sinus and proximal and mid right  sigmoid sinus, consistent with thrombus, as seen on the same-day CTV.   LTM EEG 09/11/21 - Sharp waves,  right hemisphere, maximal right parieto-occipital region  - Continuous slow, generalized and lateralized right hemisphere     ASSESSMENT AND PLAN  52 y.o. 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  Patient did have subsequent seizures and brain edema but no seizures since leaving the hospital in February 2023.  She has been anticoagulated initially on heparin and DOAC and now off anticoagulation and on Aspirin alone. She is still complaining of severe headaches, she has failed both topiramate to amitriptyline, we will try her on Nurtec. For anxiety, continue Paxil, agree with patient having family member staying with her for some times.  She is on aspirin 81 mg daily and understand that this is lifelong. In terms of the seizures, she has not had any seizures since leaving the hospital in February 2023.  She still on Keppra 500 mg twice daily.  Due to the large size of the stroke, I am reluctant to discontinue her Keppra, I will check her EEG and if normal, we will try to switch her to Briviact to due possible irritability reported by patient.  I will see her in 3 months or sooner if worse.     1. Seizure (HCC)   2. Chronic tension-type headache, not intractable   3. Cerebral venous sinus thrombosis   4. Cerebral edema (HCC)   5. Visual field cut       Patient Instructions  Continue current medications  Routine EEG, if abnormal, will switch her to Briviact.  Start Nurtec every other day as preventive medication for  headaches since you have failed Topiramate and Amitriptyline.  Follow up in 3 months or sooner if worse   Orders Placed This Encounter  Procedures   EEG adult    Meds ordered this encounter  Medications    Rimegepant Sulfate (NURTEC) 75 MG TBDP    Sig: Take 1 tablet (75 mg total) by mouth every other day.    Dispense:  16 tablet    Refill:  11    Return in about 3 months (around 04/30/2023).    Windell Norfolk, MD 01/28/2023, 3:56 PM  [facility] [address] [place], [place] [address] [phone]

## 2023-01-28 NOTE — Patient Instructions (Addendum)
Continue current medications  Routine EEG, if abnormal, will switch her to Briviact.  Start Nurtec every other day as preventive medication for headaches since you have failed Topiramate and Amitriptyline.  Follow up in 3 months or sooner if worse

## 2023-01-30 ENCOUNTER — Encounter (HOSPITAL_COMMUNITY): Payer: Self-pay | Admitting: Clinical

## 2023-01-30 ENCOUNTER — Ambulatory Visit (INDEPENDENT_AMBULATORY_CARE_PROVIDER_SITE_OTHER): Payer: Medicaid Other | Admitting: Clinical

## 2023-01-30 DIAGNOSIS — F0631 Mood disorder due to known physiological condition with depressive features: Secondary | ICD-10-CM

## 2023-01-30 DIAGNOSIS — I619 Nontraumatic intracerebral hemorrhage, unspecified: Secondary | ICD-10-CM

## 2023-01-30 DIAGNOSIS — F064 Anxiety disorder due to known physiological condition: Secondary | ICD-10-CM | POA: Diagnosis not present

## 2023-01-31 NOTE — Progress Notes (Signed)
THERAPIST PROGRESS NOTE  Session Time:   1:04-2:00pm   Session #8  Participation Level: Active  Behavioral Response: Casual and Neat Alert Euthymic  Type of Therapy: Individual Therapy  Treatment Goals addressed:  LTG: April Reilly will score less than 5 on the Generalized Anxiety Disorder 7 Scale (GAD-7)    STG: April Reilly will practice problem solving skills 3 times per week for the next 4 weeks.    LTG: Reduce frequency, intensity, and duration of depression symptoms so that daily functioning is improved   STG: Reduce overall depression score by a minimum of 25% on the Patient Health Questionnaire (PHQ-9)   ProgressTowards Goals: Progressing  Interventions: CBT, Supportive, and Social Skills Training  Summary: April Reilly is a 52 y.o. 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   She spent much of session processing the pain she has been experiencing with migraines, the visit with her neurologist, and how she feels about possibly not being able to go back to work.  The neurologist asked her to test her memory by halting her practice of taking notebooks everywhere with her, but she does not feel comfortable doing this, says that she writes down everything after a visit and then reads it before her next visit to remind herself.  She reviewed her safety precautions with the doctor and made him aware that she is driving.  It seems that he questioned her closely and, like therapist, is not entirely comfortable with her driving, cautioned her strongly about her compromised reaction time just as therapist has done.  She asked him about going back to work and he told her to work on returning to herself.  She has greater expectations of that happening than perhaps is warranted, given what she has been told about the loss of 30% of her brain function.  We talked about her ongoing grief over loss of her  long-term boyfriend.  She has found voicemails from him that she listens to repeatedly; CSW suggested she look at them as warm hugs from him.  We processed that grief some more and she was given support and unconditional permission to cry.  She talked about meeting a stranger in the store simply by asking a question, how they struck up a lengthy conversation and exchanged phone numbers, and now she is planning to go to the [employer].  She feels that is an indication she is returning to her former self, because she was always an outgoing person before who would talk to everybody.    We then processed what is going on with her long-term disability company trying to recoup money from her.  She does not understand it, stated she is awaiting a call back today to explain "the logic of it."  CSW advised her to ask for the explanation in writing since she has such memory issues.  Suicidal/Homicidal: No  Therapist Response:   Patient is progressing AEB engaging in scheduled therapy session.  She presented oriented x5 and stated she was feeling "good about seeing my neurology doctor."  CSW evaluated patient's medication compliance and self-care since last session.  We processed various things going on in her life, including deep grief, confusion over legal paperwork and demands of attorneys, as well as her ongoing migraines/pain and other aspects related to her brain's recovery from her strokes.  Throughout the session, CSW gave patient the opportunity to explore thoughts and feelings associated with  current life situations and past/present external stressors.   CSW encouraged patient's expression of feelings and validated patient's thoughts using empathy, active listening, open body language, and unconditional positive regard.      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 Patient/Guardian was advised if they have not already done so to contact the registration department to sign all necessary forms in order for [place] to release information regarding their care.   Consent: Patient/Guardian gives verbal consent for treatment and assignment of benefits for services provided during this visit. Patient/Guardian expressed understanding and agreed to proceed.   [name], LCSW 01/31/2023

## 2023-02-05 ENCOUNTER — Ambulatory Visit (INDEPENDENT_AMBULATORY_CARE_PROVIDER_SITE_OTHER): Payer: 59 | Admitting: Clinical

## 2023-02-05 ENCOUNTER — Other Ambulatory Visit (HOSPITAL_COMMUNITY): Payer: Self-pay

## 2023-02-05 ENCOUNTER — Telehealth: Payer: Self-pay

## 2023-02-05 ENCOUNTER — Encounter (HOSPITAL_COMMUNITY): Payer: Self-pay | Admitting: Clinical

## 2023-02-05 DIAGNOSIS — F064 Anxiety disorder due to known physiological condition: Secondary | ICD-10-CM

## 2023-02-05 DIAGNOSIS — F0631 Mood disorder due to known physiological condition with depressive features: Secondary | ICD-10-CM

## 2023-02-05 DIAGNOSIS — F411 Generalized anxiety disorder: Secondary | ICD-10-CM | POA: Diagnosis not present

## 2023-02-05 DIAGNOSIS — G43019 Migraine without aura, intractable, without status migrainosus: Secondary | ICD-10-CM

## 2023-02-05 NOTE — Telephone Encounter (Signed)
Pharmacy Patient Advocate Encounter   Received notification from RX Request Messages that prior authorization for Nurtec 75MG  dispersible tablets is required/requested.   Insurance verification completed.   The patient is insured through [employer] [facility] .   Per test claim: PA submitted to [employer] [facility] via CoverMyMeds Key/confirmation #/EOC  [id number] Status is pending

## 2023-02-05 NOTE — Progress Notes (Unsigned)
   THERAPIST PROGRESS NOTE  Session Time:   3:53pm-4:53pm  Session #9  Participation Level: Active  Behavioral Response: Casual and Neat Alert Euthymic  Type of Therapy: Individual Therapy  Treatment Goals addressed:  LTG: Baani will score less than 5 on the Generalized Anxiety Disorder 7 Scale (GAD-7)    STG: Taimane will practice problem solving skills 3 times per week for the next 4 weeks.    LTG: Reduce frequency, intensity, and duration of depression symptoms so that daily functioning is improved   STG: Reduce overall depression score by a minimum of 25% on the Patient Health Questionnaire (PHQ-9)   ProgressTowards Goals: Progressing  Interventions: CBT and Supportive  Summary: Kairi Archilla is a 52 y.o. 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   She reported that she has taken  back the responsibility of paying her bills because she felt she was overloading her oldest daughter with responsibilities.  However, even though she was convinced that she paid her insurance, it ended up being cancelled due to nonpayment.  She found out she had doubled her payment to [employer].  She and her daughter have already figured out a solution to this problem.  She was very "pissed off" at herself but has since forgiven herself.  From now  Suicidal/Homicidal: No  Therapist Response:   Patient is progressing AEB engaging in scheduled therapy session.  ***e presented oriented x5 and stated ***he was feeling "***."  CSW evaluated patient's medication compliance and self-care since last session.  CSW reviewed the last session with patient who reported ***.  Patient stated ***.  CSW taught CBT-related coping skills, specifically *** and ***.  Patient received these willingly and could discuss them in a way to indicate good comprehension.  CSW assigned patient the task of trying out these new coping skills  (***) prior to next session on ***.  CSW encouraged patient to schedule more therapy sessions for the future, as ***  Throughout the session, CSW gave patient the opportunity to explore thoughts and feelings associated with current life situations and past/present external stressors.   CSW encouraged patient's expression of feelings and validated patient's thoughts using empathy, active listening, open body language, and unconditional positive regard.        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 Patient/Guardian was advised if they have not already done so to contact the registration department to sign all necessary forms in order for [place] to release information regarding their care.   Consent: Patient/Guardian gives verbal consent for treatment and assignment of benefits for services provided during this visit. Patient/Guardian expressed understanding and agreed to proceed.   [name], LCSW 02/05/2023

## 2023-02-05 NOTE — Telephone Encounter (Signed)
PA has been submitted and documented in separate encounter, please sign off on rx in this encounter as PA team is unable to resolve RX requests. Thank you

## 2023-02-10 ENCOUNTER — Other Ambulatory Visit: Payer: Self-pay | Admitting: Neurology

## 2023-02-10 DIAGNOSIS — G43019 Migraine without aura, intractable, without status migrainosus: Secondary | ICD-10-CM

## 2023-02-10 MED ORDER — QULIPTA 30 MG PO TABS
30.0000 mg | ORAL_TABLET | Freq: Every day | ORAL | 3 refills | Status: DC
Start: 2023-02-10 — End: 2023-02-19

## 2023-02-10 NOTE — Telephone Encounter (Signed)
MUST TRY AJOVY, EMGALITY, QULIPTA 1ST.  ROUTING TO COVERING PROVIDER

## 2023-02-10 NOTE — Telephone Encounter (Signed)
Pharmacy Patient Advocate Encounter  Received notification from [facility] [place] that Prior Authorization for Nurtec 75MG  dispersible tabletshas been DENIED because see below..    PA #/Case ID/Reference #: PA Case ID #: [id number]   Please be advised we currently do not have a Pharmacist to review denials, therefore you will need to process appeals accordingly as needed. Thanks for your support at this time. Contact for appeals are as follows: Phone: [phone], Fax: [phone]  The denial letter has been placed in the chart under the media tab.

## 2023-02-10 NOTE — Telephone Encounter (Signed)
I will discontinue Nurtec order and start Qulipta 30 mg daily for migraine prevention.  Please advise pt that side effects may include (but are not limited to): constipation, nausea, and fatigue.  Please feel free to read the package insert.  Please be reminded that this medication is not considered completely safe in pregnancy or during lactation.

## 2023-02-13 ENCOUNTER — Ambulatory Visit (INDEPENDENT_AMBULATORY_CARE_PROVIDER_SITE_OTHER): Payer: 59 | Admitting: Clinical

## 2023-02-13 ENCOUNTER — Encounter (HOSPITAL_COMMUNITY): Payer: Self-pay | Admitting: Clinical

## 2023-02-13 DIAGNOSIS — I619 Nontraumatic intracerebral hemorrhage, unspecified: Secondary | ICD-10-CM

## 2023-02-13 DIAGNOSIS — F0631 Mood disorder due to known physiological condition with depressive features: Secondary | ICD-10-CM | POA: Diagnosis not present

## 2023-02-13 DIAGNOSIS — F064 Anxiety disorder due to known physiological condition: Secondary | ICD-10-CM

## 2023-02-13 NOTE — Progress Notes (Signed)
THERAPIST PROGRESS NOTE  Session Time:   4:01-5:01pm  Session #10  Participation Level: Active  Behavioral Response: Casual and Neat Alert Euthymic  Type of Therapy: Individual Therapy  Treatment Goals addressed:  LTG: Kerith will score less than 5 on the Generalized Anxiety Disorder 7 Scale (GAD-7)    STG: Harlan will practice problem solving skills 3 times per week for the next 4 weeks.    LTG: Reduce frequency, intensity, and duration of depression symptoms so that daily functioning is improved   STG: Reduce overall depression score by a minimum of 25% on the Patient Health Questionnaire (PHQ-9)   ProgressTowards Goals: Progressing  Interventions: Supportive and Social Skills Training  Summary: April Reilly is a 52 y.o. 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   She stated she has been feeling crazy and overwhelmed, even feels "out of body" at times.  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  We processed the way in which pain can cause a person to dissociate and aging plus mental health flare-ups can cause worse pain.  She stated the week has been crazy because her nephew in [place] has been suicidal and talking to her a lot.  Additionally, her daughter is avoiding her and being somewhat combative.  With all that has been going on, she did have a panic attack in a store this week, left her items in the cart and left altogether, did no return.       Patient stated since the incident with her brain injury she has become "more protective."  She described this further in a way that is more suggestive of paranoia.  For instance, when she leaves home, even though she locks up with many locks (5 on front door alone), and returns home to a locked house, she will still throw out any  container in the refrigerator that is open due to suspicion it may have been tampered with.  If there is an unopened container, she will keep it.  The same type of paranoia, however, does not apply to her car.  She will return to a locked car and still drink an open container.    We also processed the final word coming down from the insurance companies and she has been put on a repayment plan.  Finally, we processed her feelings about sister going on a cruise without telling her, since they used to do 1 cruise annually and 1 land trip annually together.    We still did not talk about her using previous handouts on scheduling worry time and how that went, need to follow up.  Also the plan has not yet been followed, to teach 5-4-3-2-1 and breathing skills for when she has panic attacks like she did at the store recently.  Suicidal/Homicidal: No  Therapist Response:   Patient is progressing AEB engaging in scheduled therapy session.  She presented oriented x5 and stated she was feeling "crazy and overwhelmed this week."  CSW evaluated patient's medication compliance and self-care since last session.  CSW reviewed the last session with patient who reported she had a panic attack in the store and had to leave.  Patient stated her feelings have been complicated by recent crises in the family with an upset daughter, suicidal nephew,  and with increased pain.  .    Throughout the session, CSW gave patient the opportunity to explore thoughts and feelings associated with current life situations and past/present external stressors.   CSW encouraged patient's expression of feelings and validated patient's thoughts using empathy, active listening, open body language, and unconditional positive regard.       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 Patient/Guardian was advised if they have not already done so to contact the registration department to sign all necessary forms in order for [place] to release information regarding their care.   Consent: Patient/Guardian gives verbal consent for treatment and assignment of benefits for services provided during this visit. Patient/Guardian expressed understanding and agreed to proceed.   [name], LCSW 02/13/2023

## 2023-02-17 ENCOUNTER — Ambulatory Visit (INDEPENDENT_AMBULATORY_CARE_PROVIDER_SITE_OTHER): Payer: 59 | Admitting: Neurology

## 2023-02-17 DIAGNOSIS — R569 Unspecified convulsions: Secondary | ICD-10-CM | POA: Diagnosis not present

## 2023-02-18 NOTE — Procedures (Signed)
    History:  52 year old man with seizure  EEG classification: Awake and drowsy iso SVT  Description of the recording: The background rhythms of this recording consists of a fairly well modulated medium amplitude alpha rhythm of 9 Hz that is reactive to eye opening and closure. Present in the anterior head region is a 15-20 Hz beta activity. Photic stimulation was performed, did not show any abnormalities. Hyperventilation was also performed, did not show any abnormalities. Drowsiness was manifested by background fragmentation. No abnormal epileptiform discharges seen during this recording. There was right frontotemporal focal slowing. There were no electrographic seizure identified.   Abnormality: Right frontotemporal slowing  Impression: This is an abnormal EEG recorded while drowsy and awake due to presence of right frontotemporal focal slowing. This is consistent with an area of neuronal dysfunction in the right frontotemporal region.  No evidence of interictal epileptiform discharges.    Windell Norfolk, MD [facility]

## 2023-02-19 ENCOUNTER — Other Ambulatory Visit: Payer: Self-pay | Admitting: Neurology

## 2023-02-19 MED ORDER — BRIVIACT 50 MG PO TABS: 50.0000 mg | ORAL_TABLET | Freq: Two times a day (BID) | ORAL | 5 refills | Status: AC

## 2023-02-20 ENCOUNTER — Telehealth: Payer: Self-pay | Admitting: Neurology

## 2023-02-20 NOTE — Telephone Encounter (Signed)
Pt is asking for a call to discuss the results of the EEG over the phone

## 2023-02-21 NOTE — Telephone Encounter (Signed)
Spoke with patient, discussed EEG results, all questions answered.

## 2023-02-27 ENCOUNTER — Encounter (HOSPITAL_COMMUNITY): Payer: Self-pay | Admitting: Clinical

## 2023-02-27 ENCOUNTER — Ambulatory Visit (INDEPENDENT_AMBULATORY_CARE_PROVIDER_SITE_OTHER): Payer: 59 | Admitting: Clinical

## 2023-02-27 DIAGNOSIS — F411 Generalized anxiety disorder: Secondary | ICD-10-CM | POA: Diagnosis not present

## 2023-02-27 DIAGNOSIS — F064 Anxiety disorder due to known physiological condition: Secondary | ICD-10-CM

## 2023-02-27 DIAGNOSIS — F0631 Mood disorder due to known physiological condition with depressive features: Secondary | ICD-10-CM | POA: Diagnosis not present

## 2023-02-27 NOTE — Progress Notes (Signed)
THERAPIST PROGRESS NOTE  Session Time:   11:04am-12:02pm  Session #11  Participation Level: Active  Behavioral Response: Casual and Neat Alert Euthymic and Irritable  Type of Therapy: Individual Therapy  Treatment Goals addressed:  LTG: April Reilly will score less than 5 on the Generalized Anxiety Disorder 7 Scale (GAD-7)    STG: April Reilly will practice problem solving skills 3 times per week for the next 4 weeks.    LTG: Reduce frequency, intensity, and duration of depression symptoms so that daily functioning is improved   STG: Reduce overall depression score by a minimum of 25% on the Patient Health Questionnaire (PHQ-9)   ProgressTowards Goals: Progressing  Interventions: Supportive and Social Skills Training  Summary: April Reilly is a 52 y.o. 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   She expressed that she has been having good and bad days, has continued journaling about her feelings in order to be able to remember them.  She references her writings during session.  She continues to have migraines, and her neurologist gave her a new medicine to try even though she told him she does not want to try new medicine because of her bad experience and the effect on her life.  She shared that last Thursday when she did not have a therapy appointment (but thought she did), it was a particularly rough day.  She woke up not knowing where she was, similarly to when she first had a stroke.  She complained that the medicines keep her drowsy and it feels as though somebody is pouring water into the side of her head.  She never knows from day to day how she is going to feel.  The doctor ordered an EEG which he then called her about, telling her that the right side of her brain where she had the strokes is having seizure-like activity.  These are not the type of seizures where she shakes or convulses, but  rather where she is removed from conversation and stares off into space.  She did a sleep study as well and wanted to rip the machine to shreds, particularly the part where it kept flashing lights into her closed eyes.  She did sleep the rest of the night once she was allowed to do so, but this was part of what showed the seizure activity.  This news upset her a great deal because she feels as though it is further loss of control.  She stated she feels that life is just passing her by.  She also reported being in more pain lately than usual.  She does not have a sickle cell doctor locally, always goes home to [place] to see that specialist.  CSW gave her the name and telephone number for the [facility] sickle cell clinic. She also said that recently she has felt more "weird and overwhelmed," having more panic attacks, and feeling "more out of body."  She stated she feels like she is supposed to be doing something, then asks CSW "Why do I feel that way?"  Possibilities were processed.  Patient did not remember previous talks about cognitive distortions, so CSW reintroduced the concept of CBT.  Specifically, CSW explained how our thoughts affect and often determine our feelings.  CSW provided patient with a graphic, easy worksheet of the most common cognitive distortions, and we reviewed these with a variety of examples given.  CSW moved on to explain  how it will be helpful for her to continue to review the worksheet after session and start to recognize thoughts that are distorted.  We then reviewed a worksheet called "Challenging Negative Thoughts" and another similar but longer worksheet also designed to help a person question their thoughts.    Patient talked a lot as well about time with her grandchildren, cute things they have done, and such.  Suicidal/Homicidal: No  Therapist Response:   Patient is progressing AEB engaging in scheduled therapy session.  She presented oriented x5 and stated she was having  "good days and bad ones."  CSW evaluated patient's medication compliance and self-care since last session.  CSW reviewed the last session with patient who reported she did not remember anything about cognitive distortions.  Patient stated she went through the sleep study we discussed at last session and has gotten information that she is having seizures on the right side of the brain where her strokes occurred.  CSW taught CBT-related coping skills, specifically cognitive distortions and challenging negative thoughts.  Patient received these willingly.  CSW assigned patient the task of trying out these new coping skills (cognitive distortions, 2 ways of challenging negative thoughts) prior to next session on 8/14.    Throughout the session, CSW gave patient the opportunity to explore thoughts and feelings associated with current life situations and past/present external stressors.   CSW encouraged patient's expression of feelings and validated patient's thoughts using empathy, active listening, open body language, and unconditional positive regard.      Recommendations:  Return to therapy in 1 week, engage in self care behaviors, consider the cognitive distortions handout as she goes through the next week, challenge some of the thoughts she has that lead to overwhelming or negative emotions in order to go through the process of determining if they are based in fact.  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 Patient/Guardian was advised if  they have not already done so to contact the registration department to sign all necessary forms in order for [place] to release information regarding their care.   Consent: Patient/Guardian gives verbal consent for treatment and assignment of benefits for services provided during this visit. Patient/Guardian expressed understanding and agreed to proceed.   April Chad, LCSW 02/27/2023

## 2023-03-05 ENCOUNTER — Ambulatory Visit (INDEPENDENT_AMBULATORY_CARE_PROVIDER_SITE_OTHER): Payer: 59 | Admitting: Clinical

## 2023-03-05 ENCOUNTER — Encounter (HOSPITAL_COMMUNITY): Payer: Self-pay | Admitting: Clinical

## 2023-03-05 DIAGNOSIS — F0631 Mood disorder due to known physiological condition with depressive features: Secondary | ICD-10-CM

## 2023-03-05 DIAGNOSIS — F064 Anxiety disorder due to known physiological condition: Secondary | ICD-10-CM | POA: Diagnosis not present

## 2023-03-05 DIAGNOSIS — F411 Generalized anxiety disorder: Secondary | ICD-10-CM | POA: Diagnosis not present

## 2023-03-05 NOTE — Progress Notes (Unsigned)
THERAPIST PROGRESS NOTE  Session Time:   11:05am-12:05pm  Session #12  Participation Level: Active  Behavioral Response: Casual and Neat Alert Anxious and Irritable  Type of Therapy: Individual Therapy  Treatment Goals addressed:  LTG: April Reilly will score less than 5 on the Generalized Anxiety Disorder 7 Scale (GAD-7)    STG: April Reilly will practice problem solving skills 3 times per week for the next 4 weeks.    LTG: Reduce frequency, intensity, and duration of depression symptoms so that daily functioning is improved   STG: Reduce overall depression score by a minimum of 25% on the Patient Health Questionnaire (PHQ-9)   ProgressTowards Goals: Progressing  Interventions: Supportive and Social Skills Training  Summary: April Reilly is a 52 y.o. 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   She was grouchy, stated she woke up that way this morning because she had gone to bed later than she usually tries to do before a session.  She was unable to sleep because of the question about [facility], where they are demanding she return $17,000 or so from her but at the same time continuing to pay her monthly disability payments.  CSW could not help her to understand this, but suggested that perhaps her insurance representative who sold her the policy could help her to understand it.  She will get in touch with [facility] to find out who to talk to.  She is having headaches more and more often.  She is supposed to finish her current Keppra prescription in the next 1-2 weeks then start the new medicine.  She still has not decided whether to do this, is looking into the side-effects because of the drastic effect on her life that a previous medicine had on her when she took it without researching it.  She talked about getting a letter at one point stating she was fired from [employer], then getting a  second letter saying it was a mistake.  She also talked about how the people she worked with and was close to are no longer allowed to talk with her per [employer].  She also is having more and more "crisis" moments with feeling like she is being bitten by ants on her legs, states that previously it only happened when she was sitting, but now it is when she is standing as well.  She really needs to see the sickle cell clinic, but has not yet called them, was encouraged to do so.  She angrily stated she is tired of taking meds.  CSW used humor to address this.  She kept talking about wanting to go back to life the way it was.  She is more agitated because she and her daughters have been cleaning out 2 extra rooms in her house that have a lot of stuff in them from her previous life but she does not remember them, which is very saddening and maddening.  This was processed at length with her.  She may see some old friends soon at a party, but her reluctance to go is that she does not want to have to explain where she has been or why she has been missing, does not want "poking and prodding."  CSW suggested that she come up with some prepared statement to stop speculation such as "I'm recovering well, thank you, can we talk about something else" or "That is not something I want  to talk about, how are you?"  She agreed to do this.  She also talked about how many times each night she is checking her door, and sometimes when she does this or the next morning she will find one lock (of many) not locked.  This makes her paranoid that someone is accessing her home while she sleeps.  CSW suggested locking everything, taking a picture of it, then checking the picture instead of the actual door.  She will consider this.  Suicidal/Homicidal: No  Therapist Response:   Patient is progressing AEB engaging in scheduled therapy session.  She presented oriented x5 and stated she was feeling "grouchy as hell."  CSW evaluated  patient's medication compliance and self-care since last session. We processed a variety of feelings that continue to come up regarding her traumatic brain injury and strokes.  Throughout the session, CSW gave patient the opportunity to explore thoughts and feelings associated with current life situations and past/present external stressors.   CSW encouraged patient's expression of feelings and validated patient's thoughts using empathy, active listening, open body language, and unconditional positive regard.      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 Patient/Guardian was advised if they have not already done so to contact the registration department to sign all necessary forms in order for [place] to release information regarding their care.   Consent: Patient/Guardian gives verbal consent for treatment and assignment of benefits for services provided during this visit. Patient/Guardian expressed understanding and agreed to proceed.   [name], LCSW 03/05/2023

## 2023-03-19 ENCOUNTER — Encounter (HOSPITAL_COMMUNITY): Payer: Self-pay | Admitting: Clinical

## 2023-03-19 ENCOUNTER — Ambulatory Visit (INDEPENDENT_AMBULATORY_CARE_PROVIDER_SITE_OTHER): Payer: 59 | Admitting: Clinical

## 2023-03-19 DIAGNOSIS — F0631 Mood disorder due to known physiological condition with depressive features: Secondary | ICD-10-CM | POA: Diagnosis not present

## 2023-03-19 DIAGNOSIS — I619 Nontraumatic intracerebral hemorrhage, unspecified: Secondary | ICD-10-CM

## 2023-03-19 DIAGNOSIS — F064 Anxiety disorder due to known physiological condition: Secondary | ICD-10-CM | POA: Diagnosis not present

## 2023-03-19 NOTE — Progress Notes (Unsigned)
THERAPIST PROGRESS NOTE  Session Time:   1:00-2:00pm  Session #13  Participation Level: Active  Behavioral Response: Casual and Neat Alert Anxious, Depressed, and Irritable  Type of Therapy: Individual Therapy  Treatment Goals addressed:  LTG: Evalet will score less than 5 on the Generalized Anxiety Disorder 7 Scale (GAD-7)    STG: April Reilly will practice problem solving skills 3 times per week for the next 4 weeks.    LTG: Reduce frequency, intensity, and duration of depression symptoms so that daily functioning is improved   STG: Reduce overall depression score by a minimum of 25% on the Patient Health Questionnaire (PHQ-9)   ProgressTowards Goals: Progressing  Interventions: Solution Focused and Supportive  Summary: April Reilly is a 52 y.o. 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   The patient was quite upset about the way in which her appointment times change and reported that this is really challenging her sleep schedule and anxiety.  We agreed on 4:00pm meetings and the front desk was helpful in scheduling some of these for the future.  She wanted CSW to read a couple of lengthy phone notes she had written to herself explaining how she felt.  These revealed that she is feeling "bat shit crazy."  She had a brief period of not having access to her Keppra prescription and she took the new medicine the neurologist wanted to trade out for the Keppra for 2 days.  During that 2 days things went very poorly with a change and increase in symptoms, including visual hallucinations of herself walking alongside her real self.  She heard noises in the house although nobody was there and she did not feel like herself.  She wrote "l am losing my mind."  She has developed significant paranoia about her neurologist as a result of this, because she does not understand why he would put her on a medication  that would do this.  She was able to identify his reason being that he is afraid the Keppra is damaging her liver.  She talked to her doctor in [place] who advised that her liver be tested if the neurologist is concerned that the Keppra could be harming her.    Patient also reported having vivid disturbing dreams.  She shared a story of her grandchildren spending the night along with an event that occurred where they took an [place] at 11pm to an aunt's house.  She stood up for what she believed was right and told her daughters not to bring any of their children over to her house unless the intention is for them to stay the entire night, because this was too stressful a situation.  Some daughters responded positively and others did not.  This was processed at length with her.  She acted considerably calmer by the time she left the session.  Suicidal/Homicidal: No  Therapist Response:   Patient is progressing AEB engaging in scheduled therapy session.  She presented oriented x5 and stated she was feeling "mood swings and bat shit crazy."  CSW evaluated patient's medication compliance and self-care since last session.   Patient stated she had to take the different seizure medicine for 2 days because she could not get her Keppra refilled, and she ended up having psychiatric side-effects such as hallucinations.  This was processed, along with the increase in paranoia about her neurologist whom she previously was trusting implicitly.  CSW  encouraged her to be open-minded because of her previous trust in him due to his excellent treatment, but it was hard to address this since she faults the hospital system for having a doctor who originally prescribed medication which harmed her and changed her life so drastically. CSW encouraged patient to schedule more therapy sessions for the future, as she only has one remaining.  She complained about times constantly changing, especially morning appointments since she does not  go to sleep until around 5am.  CSW asked for a series of 4:00pm appointments be set up for her, which was done by our excellent front-desk staff.  Throughout the session, CSW gave patient the opportunity to explore thoughts and feelings associated with current life situations and past/present external stressors.   CSW encouraged patient's expression of feelings and validated patient's thoughts using empathy, active listening, open body language, and unconditional positive regard.      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 Patient/Guardian was advised if they have not already done so to contact the registration department to sign all necessary forms in order for [place] to release information regarding their care.   Consent: Patient/Guardian gives verbal consent for treatment and assignment of benefits for services provided during this visit. Patient/Guardian expressed understanding and agreed to proceed.   [name], LCSW 03/19/2023

## 2023-04-03 ENCOUNTER — Ambulatory Visit (INDEPENDENT_AMBULATORY_CARE_PROVIDER_SITE_OTHER): Payer: 59 | Admitting: Clinical

## 2023-04-03 DIAGNOSIS — F064 Anxiety disorder due to known physiological condition: Secondary | ICD-10-CM

## 2023-04-03 DIAGNOSIS — I619 Nontraumatic intracerebral hemorrhage, unspecified: Secondary | ICD-10-CM

## 2023-04-03 DIAGNOSIS — F0631 Mood disorder due to known physiological condition with depressive features: Secondary | ICD-10-CM

## 2023-04-03 NOTE — Progress Notes (Signed)
THERAPIST PROGRESS NOTE  Session Time:   4:02-5:02pm  Session #14  Participation Level: Active  Behavioral Response: Casual and Neat Alert Anxious, Hopeless, and Irritable  Type of Therapy: Individual Therapy  Treatment Goals addressed:  LTG: Natha will score less than 5 on the Generalized Anxiety Disorder 7 Scale (GAD-7)    STG: Quaneisha will practice problem solving skills 3 times per week for the next 4 weeks.    LTG: Reduce frequency, intensity, and duration of depression symptoms so that daily functioning is improved   STG: Reduce overall depression score by a minimum of 25% on the Patient Health Questionnaire (PHQ-9)   ProgressTowards Goals: Progressing  Interventions: Solution Focused and Supportive  Summary: Jakyrah Myklebust is a 52 y.o. 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   Currently her 15yo grandson is staying with her; since she is responsible for taking him to school, she is now going to bed around 2-3am instead of 5-6am, which she actually considers to be an improvement.  She shared a lot of details about various events over the last few years with her landlord who is also the landlord of all her daughters.  One of her daughters was recently evicted and another daughter was threatened with eviction if she allows the first daughter to stay with her.  Landlord may not be aware that this daughter's son is staying with the patient and may be upset if she finds out.  Patient also reports that her recent was originally $725 monthly, then went to $925 monthly, and now the landlord wants to charge $1,350 monthly even though not changes/upgrades have occurred.  They have had harsh words over this.  Landlord was offended when patient informed her that she had been in touch with the state agency which is in charge of tenant rights, was told the rent can only go up 11%.  Patient is looking  for alternative housing with the thought this would go fairly rapidly; CSW informed her of the likelihood of it taking quite a big longer and not to burn bridges.  Patient continues to complain that she cannot taste or smell anything.  She also keeps hearing a phone ringing just as clear as can be, but nothing is actually there and nobody else hears it.  Suicidal/Homicidal: No  Therapist Response:   Patient is progressing AEB engaging in scheduled therapy session.  She presented oriented x5 and stated she was feeling "like I need to go back to bed and get up on the other side."  CSW evaluated patient's medication compliance and self-care since last session.  CSW reviewed the last session with patient who reported she has been very stressed about her housing situation.  Patient stated her landlord raised her rent in the recent past from $725 monthly to $925 monthly.  Now she has decided she wants to go up to $1,350.  Session was spent in allowing her to vent, as well as brainstorming appropriate responses that would not leave her in a worse situation.  She was informed her next scheduled session is on 10/23.  She is quite dismayed that her next appointment is so far out and would like to be put on a cancellation list.  Throughout the session, CSW gave patient the opportunity to explore thoughts and feelings associated with current life situations and past/present external stressors.   CSW encouraged patient's expression of feelings and validated patient's  thoughts using empathy, active listening, open body language, and unconditional positive regard.        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 Patient/Guardian was advised if they have not already done so to contact the registration department to sign all necessary forms in order for [place] to release information regarding their care.   Consent: Patient/Guardian gives verbal consent for treatment and assignment of benefits for services provided during this visit. Patient/Guardian expressed understanding and agreed to proceed.   [name], LCSW 04/06/2023

## 2023-04-06 ENCOUNTER — Encounter (HOSPITAL_COMMUNITY): Payer: Self-pay | Admitting: Clinical

## 2023-04-10 ENCOUNTER — Telehealth: Payer: Self-pay

## 2023-04-10 NOTE — Telephone Encounter (Signed)
Received form from the [facility] Disability. Fax dated 8/29. Reached out to patient regarding form fee. Per patient, she can't make the payment right now but can make it when she comes in for her appointment on 10/1.   She also advised that Dr Teresa Coombs normally waives the fee for her. I checked with Dr Teresa Coombs and he stated that she would need to make the $50 payment.   I called and advised patient of same, and that I would make the front aware of that fee.

## 2023-04-16 ENCOUNTER — Telehealth (HOSPITAL_COMMUNITY): Payer: Self-pay | Admitting: Clinical

## 2023-04-17 ENCOUNTER — Ambulatory Visit (INDEPENDENT_AMBULATORY_CARE_PROVIDER_SITE_OTHER): Payer: 59 | Admitting: Clinical

## 2023-04-17 ENCOUNTER — Encounter (HOSPITAL_COMMUNITY): Payer: Self-pay

## 2023-04-17 ENCOUNTER — Encounter (HOSPITAL_COMMUNITY): Payer: Self-pay | Admitting: Clinical

## 2023-04-17 DIAGNOSIS — F0631 Mood disorder due to known physiological condition with depressive features: Secondary | ICD-10-CM | POA: Diagnosis not present

## 2023-04-17 DIAGNOSIS — F411 Generalized anxiety disorder: Secondary | ICD-10-CM

## 2023-04-17 DIAGNOSIS — F064 Anxiety disorder due to known physiological condition: Secondary | ICD-10-CM

## 2023-04-17 NOTE — Progress Notes (Signed)
THERAPIST PROGRESS NOTE  Session Time:   3:00-4:00pm  Session #15  Participation Level: Active  Behavioral Response: Casual and Neat Alert Euthymic and Irritable  Type of Therapy: Individual Therapy  Treatment Goals addressed:  New treatment plan developed:  Problem: Anxiety Goal: LTG: Amela will score less than 5 on the Generalized Anxiety Disorder 7 Scale (GAD-7) Goal: STG: Severine will practice problem solving skills 3 times per week for the next 4 weeks. Goal: STG: Likisha will reduce frequency of avoidant behaviors by 50% as evidenced by self-report in therapy sessions Goal: LTG: Salena will be able to acknowledge and accept her new way of life with the brain injury 5 days out of 7 Goal: STG: Deah will practice how to handle anxiety-provoking situations where she is faced with sharing information about her brain injury and/or making decisions Intervention: Instruct Kimaya on systematic desensitization and development of a hierarchy of feared situations in weekly individual session. Intervention: Perform motivational interviewing regarding completion of homework assignments Intervention: Review results of GAD-7 with [place] to track progress Intervention: Work with Jenel Lucks to complete a TRAP analysis (trigger, response, avoidance pattern) of their avoidant behaviors. Intervention: Work with Jenel Lucks to identify a minimum of 3 consequences of avoidance. Intervention: Work with Jenel Lucks to identify a minimum of 3 alternative coping behaviors to avoidance. Intervention: Therapist will teach Latise how to do a cost/benefit analysis for various decisions in her life   Problem: Depression Goal: LTG: Reduce frequency, intensity, and duration of depression symptoms so that daily functioning is improved Goal: STG: Reduce overall depression score by a minimum of 25% on the Patient Health Questionnaire (PHQ-9) Goal: STG: Kourtnee will identify cognitive patterns and beliefs that support  depression Goal: STG: Lindalee will practice behavioral activation skills 1-2 times per week for the next 26 weeks Goal: LTG: Adrihanna will be able to identify reasons her brain injury can cause depression symptoms and recognize when this is happening. Goal: STG: Jesyca will be able to talk about her brain injury without becoming overwhelmed with negative emotion. Intervention: Therapist will educate patient on cognitive distortions and the rationale for treatment of depression Intervention: Therapist will help Kaidynn identify 3 cognitive distortions they are currently using and write reframing statements to replace them Intervention: Identify opportunities to increase self-esteem Intervention: Reinforce positive adaptation of new coping behaviors Intervention: Therapist will help Darianne identify 3-5 cognitive distortions they are currently using and write reframing statements to replace them Intervention: Therapist will provide pleasant activities list and help Leor to select 1-2 activities to practice weekly for the next 26 weeks Intervention: Therapist will work with Jenel Lucks to process her various experiences about the loss of memory that affects her emotionally and socially  Problem: Social Interpersonal Effectiveness Goal: STG: Celisa will identify 3-5 behaviors that actually indicate social isolation Goal: LTG: Douglas will engage in at least 1 social activity outside of family per month Goal: STG: Mckenize will learn about boundaries and how to set them AEB her ability to successful set boundaries by self-report Intervention: Educate Aivah on appropriate boundaries and how to set them Intervention: Facilitate discussions with [place] regarding miscommunications that may occur during social interactions Intervention: Process with Jacaria her feelings about expanding her social life back to being more social like she was before her brain injury   ProgressTowards Goals:  Progressing  Interventions: Conservator, museum/gallery and Supportive  Summary: Alveda Parden is a 52 y.o. female who presents with depression due to another medical condition, namely a brain injury that occurred with  2 strokes in February 2023.   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  She was able to see and chat with a number of former co-workers who were excited to see her and told her how much they have missed her.  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  There were a number of people that her friend had to tell her about, although there were 2 that she could remember.   Her instinct was to not talk to one person who came to see her and whom she did not remember.  She found out the reason from her best friend later, which was because the friend had refused to take her phone calls once she had her brain injury due to instructions from the hospital.  She could not remember not only the people she saw, she could not remember the circumstances of their relationships or any of the outside jobs she had done for them.  CSW normalized this for her and reminded her she is creating new memories of them now.  She also had almost a week of stressful interactions with her sister who was evicted from her apartment.  Part of that time, the sister, her two adult daughters, her infant grandbaby, and her dog came to stay with the patient.  They did not abide by the rules she told them about and then were offended when she pointed out that they had ignored the rules.  As a result, they left to stay in a hotel, only to turn around and ask if they could return and stay a full week.  To date, she has not told them no, but everybody in the family is advising her to do so.  CSW processed with her how to set boundaries  without giving space for somebody to talk her into something she does not want to do.  CSW emphasized her need for rest for her brain injury and that she is not being inhospitable but is right to put her needs first.  During the last 6 days when this has been happening she has not been sleeping, which is detrimental to her health.  Patient shared that her landlord has not bothered her any further about paying higher rent or leaving.  We also briefly processed a new relationship she is developing, her insurance company's motivations for continuing payments even after her disability started, and how it felt for her to go this long without a therapy session.  She is hopeful there will be another cancellation before her next appointment so she will not have to wait that long.  Suicidal/Homicidal: No  Therapist Response:   Patient is progressing AEB engaging in scheduled therapy session.  She presented oriented x5 and stated she was feeling "irritated and tired."  CSW evaluated patient's medication compliance and self-care since last session.  We processed a variety of things that have been going on in patient's life since last session.  Throughout the session, CSW gave patient the opportunity to explore thoughts and feelings associated with current life situations and past/present external stressors.   CSW encouraged patient's expression of feelings and validated patient's thoughts using empathy, active listening, open body language, and unconditional positive regard.       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 Patient/Guardian was advised if they have not already done so to contact the registration department to sign all necessary forms in order for [place] to release information regarding their care.   Consent: Patient/Guardian gives verbal consent for treatment and assignment of benefits for services provided during this visit. Patient/Guardian expressed understanding and agreed to proceed.   [name], LCSW 04/17/2023

## 2023-04-18 NOTE — Telephone Encounter (Signed)
Entered by mistake Ambrose Mantle, LCSW 04/18/2023, 4:04 PM

## 2023-04-22 ENCOUNTER — Encounter: Payer: Self-pay | Admitting: Neurology

## 2023-04-22 ENCOUNTER — Ambulatory Visit (INDEPENDENT_AMBULATORY_CARE_PROVIDER_SITE_OTHER): Payer: 59 | Admitting: Neurology

## 2023-04-22 VITALS — BP 127/89 | HR 91 | Ht 59.0 in | Wt 166.5 lb

## 2023-04-22 DIAGNOSIS — G44229 Chronic tension-type headache, not intractable: Secondary | ICD-10-CM | POA: Diagnosis not present

## 2023-04-22 DIAGNOSIS — H5340 Unspecified visual field defects: Secondary | ICD-10-CM

## 2023-04-22 DIAGNOSIS — G08 Intracranial and intraspinal phlebitis and thrombophlebitis: Secondary | ICD-10-CM

## 2023-04-22 DIAGNOSIS — R569 Unspecified convulsions: Secondary | ICD-10-CM | POA: Diagnosis not present

## 2023-04-22 DIAGNOSIS — R413 Other amnesia: Secondary | ICD-10-CM

## 2023-04-22 DIAGNOSIS — I618 Other nontraumatic intracerebral hemorrhage: Secondary | ICD-10-CM

## 2023-04-22 DIAGNOSIS — F419 Anxiety disorder, unspecified: Secondary | ICD-10-CM

## 2023-04-22 MED ORDER — CYCLOBENZAPRINE HCL 10 MG PO TABS: 10.0000 mg | ORAL_TABLET | Freq: Three times a day (TID) | ORAL | 0 refills | Status: DC | PRN

## 2023-04-22 MED ORDER — LEVETIRACETAM 500 MG PO TABS: 500.0000 mg | ORAL_TABLET | Freq: Two times a day (BID) | ORAL | 3 refills | Status: DC

## 2023-04-22 NOTE — Progress Notes (Signed)
[facility]  PATIENT: April Reilly DOB: July 13, 1971  REQUESTING CLINICIAN: No ref. provider found HISTORY FROM: Patient REASON FOR VISIT: Venous thrombosis follow up   HISTORICAL  CHIEF COMPLAINT:  Chief Complaint  Patient presents with   Seizures    RM13, ALONE, PT STATED THAT SHE NEVER STOPPED KEPPRA LIKE PROVIDER ASKED, SZ: last one was so long ago she cannot remember   INTERVAL HISTORY 04/22/2023:  Patient presents for follow-up, last visit was in July since then he has not had any seizure or seizure activity.  She is compliant with her medications including Keppra 500 mg twice daily and Flexeril.  She she is not sure if she is taking the amitriptyline or not. We repeated her EEG which showed right hemispheric slowing, no epileptiform discharges and no seizures.  At last visit, she was complaining of chronic headaches, we prescribed her Nurtec but it was not approved and patient started on Qulipta.  While taking the [place] she developed new hallucinations therefore discontinued.  Since discontinuing the Qulipta, she has not had any similar symptoms.  Her headaches, occur every other day.  She reports that Flexeril helps.  She still struggles with memory, reports that she wants to go back to work but unsure if she can actually hold a job because now she has right   INTERVAL HISTORY 01/28/2023:  Patient presents today for follow-up, she is alone.  Last visit was in January and since then she has travelled to [place] and saw her PCP, had workup which was unrevealing.  She reports compliance with her medications, denies any seizure or seizure like activity.  She remains on the Keppra 500 mg twice daily.  She continues to complain of headaches, chronic headaches.  She has tried topiramate and amitriptyline without any benefit.  She continues on Motrin as needed for the headaches.  Reports that her headaches are almost daily.  She still reports anxiety, feels like her  anxiety is worse when she is home alone by herself, sometimes she hears a phone ring when no phone is ringing.  She reports a few weeks ago her sister came and stay with her and during that time, she did not have any hallucinations, did not have any symptom of anxiety.  While she was in [place] also visiting family, she did not experience any hallucinations and no worsening anxiety while in the house but she will get anxious when she is in the crowd in public.   INTERVAL HISTORY 07/30/2022:  Patient presents today for follow-up, at last visit in October plan was to discontinue Topamax, start Elavil and Paxil.  Patient has not started those medications and she is still on Topamax.  She still complains of severe headaches about 2-3 times per week.  When she had these bad headaches, she takes muscle relaxant.  She also reports increase in anxiety, trouble falling asleep, patient's reported she is scare to sleep because she lives alone.  Lately she has been sleeping with a loaded gun on her bed. She still has not set up care with a primary care physician   INTERVAL HISTORY 05/07/2022:  Yoneko presents today for follow-up, she is alone.  Since last visit she had an episode of severe vaginal bleeding, her Xarelto was discontinued.  Since she has been anticoagulated for 8 months,  it was reasonable to discontinue Xarelto.  She is presenting today complaining of headache, at last visit we had increased her Topamax to 100 mg nightly but she still  reports headache that usually start in the back of the neck.  She reported Flexeril helps but she ran out of medication.  She also tried Fioricet which helped her.  She denies any migrainous features, denies any nausea or vomiting.  She reported she is very anxious and worry about her brain injury, she cannot remember her seizure and worried about having another seizure.  Her vision is still affected, and wonder if she will get her vision back. She has not seen oph due to  lack of insurance.    INTERVAL HISTORY 03/12/2022:  Patient presents today for follow-up, last visit was in June at that time plan was to continue with Eliquis and obtain a repeat CT venogram.  She reported she ran out of Eliquis and actually her insurance dropped her because of FMLA.  Luckily she did follow-up with Dr. Al Pimple, hematologist and patient was started on Xarelto.  She did have her CT venogram yesterday but pending official read.  She reports that she still having occasional headaches that is relieved by muscle relaxant, her vision is still poor and she is pending a ophthalmology follow-up next week.  Denies any recent fall.  Denies any new focal weakness.     INTERVAL HISTORY 12/24/21:  Patient presents today for follow-up, her CT venogram was repeated and it showed residual nonocclusive thrombus involving the distal right transverse and sigmoid sinus sinuses.,  Persistent but somewhat improved in appearance as compared to previous exam.  No new dural sinus thrombosis.  She remains on Eliquis 5 mg twice daily.  She did follow-up with primary care doctor and hematology.  She is pending a complete hematology work-up.  Currently she is complaining of left shoulder arm pain, some migraine headaches.  She ran out of some of her medications more than a month ago and have not refill it.    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  She was initiated on anticoagulation.  Neurosurgery was consulted but patient did not have a craniectomy.  She  developed seizures, started on Keppra  Her symptoms improve, she was discharge to acute rehab and her plan was to continue with anticoagulation for at least 6 months.  Patient report prior to her hospitalization she has been doing well, she has chronic anemia, sickle cell disease but she was not taken on any medication.  She reports a few weeks prior to presentation she developed vaginal bleeding and she was started on megestrol acetate.  Since discharge from the hospital, daughter reported improvement of her mental status, confusion improved, she does continue with physical therapy and she is getting more independent.  She is compliant with all medications.  Patient reports prior to her hospitalization she was not taking any medication but since discharge she is on 13 medications, she feels like she does not need all of them.   Hospital course below. Oza Oberle is a 52 y.o. female with history of SS trait, uterine fibroid, abnormal vaginal bleeding x4 months who was admitted on 09/10/2021 with 3-day history of malaise, fall and  progressive somnolence.  She was found to have RLL aspiration pneumonia as well as IPH due to right hemorrhagic infarct with edema and right frontal, parietal, occipital and temporal lobes and 9 mm right to left midline shift.  She was started on hypertonic saline and EEG done showing evidence of epileptogenicity with high potential for seizures.  Work-up also revealed subocclusive clot in right distal right transverse sinus and right sigmoid dural venous sinus and she was started on IV heparin.  Dr. Roda Shutters felt that patient with large right frontoparietal venous infarct with hemorrhagic conversion due to unclear etiology.  Neurosurgery was consulted due to concerns about the herniation and recommended conservative care with serial CCT for monitoring. She was loaded with Dilantin due to concerns of seizure and placed on long-term EEG.  Dilantin was discontinued and Keppra added on  02/26.  Hypertonic saline was weaned off and mentation was slowly improving.  Repeat CTA/V of 02/27 showed evolving hemorrhagic infarct with mild improvement in mass effect and improved nonocclusive thrombus in right distal transverse and sigmoid sinus.  She was transition to DOAC on 02/28 with neurology recommending 3 to 6 months of AC as well as close neurological follow-up.  Hospital course was significant for issues with headaches, hypotension, tachycardia, left-sided weakness with balance deficits as well as cognitive deficits and disorientation.  CIR was recommended due to functional decline.    OTHER MEDICAL CONDITIONS: SS Trait, anemia    REVIEW OF SYSTEMS: Full 14 system review of systems performed and negative with exception of: as noted in the HPI   ALLERGIES: Allergies  Allergen Reactions   Megestrol Other (See Comments) and Swelling    Caused blood clot in brain  Caused blood clot in brain    Caused blood clot in brain Caused blood clot in brain   Qulipta [Atogepant] Anxiety    HALLUCINATION    HOME MEDICATIONS: Outpatient Medications Prior to Visit  Medication Sig Dispense Refill   amitriptyline (ELAVIL) 25 MG tablet TAKE 1 TABLET BY MOUTH EVERYDAY AT BEDTIME 90 tablet 2   aspirin EC 81 MG tablet Take 81 mg by mouth daily. Swallow whole.     diclofenac Sodium (VOLTAREN) 1 % GEL Place onto the skin.     PARoxetine (PAXIL) 10 MG tablet Take 1 tablet (10 mg total) by mouth daily. 30 tablet 3   cyclobenzaprine (FLEXERIL) 10 MG tablet Take 1 tablet (10 mg total) by mouth 3 (three) times daily as needed for muscle spasms. 30 tablet 0   levETIRAcetam (KEPPRA) 500 MG tablet Take 500 mg by mouth 2 (two) times daily.     QULIPTA 30 MG TABS TAKE 1 TABLET (30 MG TOTAL) BY MOUTH DAILY AT 2 PM. 30 tablet 3   No facility-administered medications prior to visit.    PAST MEDICAL HISTORY: Past Medical History:  Diagnosis Date   Asthma    Stroke (HCC)    Vaginal bleeding, abnormal     for 4 months    PAST SURGICAL HISTORY: Past Surgical History:  Procedure Laterality Date   FOOT SURGERY     TUBAL LIGATION      FAMILY HISTORY: Family History  Problem Relation Age of Onset   Clotting disorder Mother     SOCIAL HISTORY: Social History   Socioeconomic History   Marital status: Single    Spouse name: Not on file   Number of children: Not on file   Years of education: Not on file   Highest education level: Not on  file  Occupational History   Not on file  Tobacco Use   Smoking status: Never   Smokeless tobacco: Never  Substance and Sexual Activity   Alcohol use: Not Currently   Drug use: Never   Sexual activity: Not Currently  Other Topics Concern   Not on file  Social History Narrative   Right handed   Caffeine ocassional   Lives alone with dog   Social Determinants of Health   Financial Resource Strain: Not on file  Food Insecurity: Not on file  Transportation Needs: Not on file  Physical Activity: Not on file  Stress: Low Risk  (02/27/2020)   Received from [employer]   Stress    Stress Risk: Not on file    General Anxiety Disorder (GAD-7) Score: Not on file  Social Connections: Not on file  Intimate Partner Violence: Not on file    PHYSICAL EXAM  GENERAL EXAM/CONSTITUTIONAL: Vitals:  Vitals:   04/22/23 1056  BP: 127/89  Pulse: 91  Weight: 166 lb 8 oz (75.5 kg)  Height: 4\' 11"  (1.499 m)    Body mass index is 33.63 kg/m. Wt Readings from Last 3 Encounters:  04/22/23 166 lb 8 oz (75.5 kg)  01/28/23 168 lb (76.2 kg)  07/30/22 158 lb (71.7 kg)   Patient is in no distress; well developed, nourished and groomed; neck is supple  MUSCULOSKELETAL: Gait, strength, tone, movements noted in Neurologic exam below  NEUROLOGIC: MENTAL STATUS:      No data to display         awake, alert, oriented to person, place and time Difficulty with recent memory  normal attention and concentration language fluent,  comprehension intact, naming intact fund of knowledge appropriate  CRANIAL NERVE:  2nd, 3rd, 4th, 6th - Left visual field cut, extraocular muscles intact, no nystagmus 5th - facial sensation symmetric 7th - facial strength symmetric 8th - hearing intact 9th - palate elevates symmetrically, uvula midline 11th - shoulder shrug symmetric 12th - tongue protrusion midline  MOTOR:  normal bulk and tone, full strength in the BUE, BLE  SENSORY:  normal and symmetric to light touch  COORDINATION:  finger-nose-finger, fine finger movements normal  GAIT/STATION:  normal   DIAGNOSTIC DATA (LABS, IMAGING, TESTING) - I reviewed patient records, labs, notes, testing and imaging myself where available.  Lab Results  Component Value Date   WBC 4.2 10/19/2021   HGB 12.5 10/19/2021   HCT 39.8 10/19/2021   MCV 78.7 (L) 10/19/2021   PLT 253 10/19/2021      Component Value Date/Time   NA 137 10/19/2021 1250   K 3.9 10/19/2021 1250   CL 106 10/19/2021 1250   CO2 23 10/19/2021 1250   GLUCOSE 92 10/19/2021 1250   BUN <5 (L) 10/19/2021 1250   CREATININE 0.76 10/19/2021 1250   CALCIUM 9.3 10/19/2021 1250   PROT 6.8 09/20/2021 0544   ALBUMIN 3.0 (L) 09/20/2021 0544   AST 53 (H) 09/20/2021 0544   ALT 80 (H) 09/20/2021 0544   ALKPHOS 83 09/20/2021 0544   BILITOT 0.3 09/20/2021 0544   GFRNONAA >60 10/19/2021 1250   Lab Results  Component Value Date   CHOL 139 09/11/2021   HDL 46 09/11/2021   LDLCALC 84 09/11/2021   TRIG 47 09/11/2021   CHOLHDL 3.0 09/11/2021   Lab Results  Component Value Date   HGBA1C 5.3 09/11/2021   Lab Results  Component Value Date   VITAMINB12 364 09/11/2021   Lab Results  Component  Value Date   TSH 0.164 (L) 09/11/2021    CT Venogram 11/28/2021 1. 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 Previously seen blood products have resolved. 2. Residual nonocclusive thrombus  involving the distal right transverse and sigmoid sinuses, persistent but somewhat improved in appearance as compared to previous exam. No new dural sinus thrombosis. 3. No other new acute intracranial abnormality.  CT Venogram 09/17/21. Evolving hemorrhagic infarction primarily involving temporal and occipital lobes with extension into the parietal lobe. No new hemorrhage. Mass effect is stable to mildly improved. Persistent but improved nonocclusive thrombus within the right distal transverse and sigmoid sinuses   Head CT 09/10/21 Large region of cortical/subcortical edema within the right frontal, parietal, occipital and temporal lobes, as well as right insula and subinsular region. Superimposed patchy foci of acute parenchymal hemorrhage within the right parietal, occipital and temporal lobes. These findings are favored to reflect an acute/early subacute infarct from arterial ischemia (with hemorrhagic conversion) or a hemorrhagic infarct due to intracranial venous thrombosis. An MRI of the brain with contrast (including thin-slice T1-weighted post-contrast imaging for assessment of the intracranial venous structures) is recommended for further evaluation. A CTA of the head/neck should also be considered.   Associated mass effect with significant partial effacement of the right lateral ventricle and 9 mm leftward midline shift. The basal cisterns are effaced. Neurosurgical consultation is advised.   Moderate-volume extension of hemorrhage into the right lateral ventricle is questioned. No definite evidence of hydrocephalus or ventricular entrapment at this time   MRI Brain 09/10/21 1. Large area of cortical infarction in the right temporal, occipital, parietal, and posterior frontal lobes, with significant associated edema and redemonstrated intraparenchymal hemorrhage. The mass effect causes 11 mm of right-to-left midline shift, medial displacement of the right uncus and right temporal horn and  effacement of the basal cisterns, unchanged from the prior CT and raising concern for right uncal herniation. 2. Additional focal area of restricted diffusion in the medial aspect of the right basal ganglia, suspected to be in the preoptic area of the hypothalamus, likely sequela of aforementioned mass effect. 3. Hemorrhage layering in the occipital horns of the lateral ventricles, with redemonstrated effacement of the right lateral and third ventricle. No evidence of hydrocephalus. 4. Small amount of subdural hemorrhage along the right frontal lobe, measuring up to 4 mm. 5. Filling defect in the distal right transverse sinus and proximal and mid right sigmoid sinus, consistent with thrombus, as seen on the same-day CTV.   LTM EEG 09/11/21 - Sharp waves,  right hemisphere, maximal right parieto-occipital region  - Continuous slow, generalized and lateralized right hemisphere   Routine EEG 02/17/2023 - Right frontotemporal slowing    ASSESSMENT AND PLAN  52 y.o. 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  Patient did have subsequent seizures and brain edema but no seizures since leaving the hospital in February 2023.  She has been anticoagulated initially on heparin and DOAC and now off anticoagulation and on Aspirin alone. She is still complaining of severe headaches, she has failed both topiramate to amitriptyline, had hallucinations with Edmund Hilda, Nurtec not approved. For anxiety, continue Paxil, agree with patient having family member staying with her for some times.  She is on aspirin 81 mg daily and understand that this is lifelong. In terms of the seizures, she has  not had any seizures since leaving the hospital in February 2023.  She still on Keppra 500 mg twice daily.  Her repeat routine EEG showed right hemispheric slowing. Due to the large  size of the stroke, and abnormal EEG, will continue patient on Keppra and monitor for side effect. I recommended switching to a different ASM, but she is reluctant.  Continue to follow up with PCP and return in 6 months or sooner if worse. I will also refer her for a formal neuropsychological testing since there is cognitive impairment and patient wanting to go back to work.     1. Seizure (HCC)   2. Chronic tension-type headache, not intractable   3. Cerebral venous sinus thrombosis   4. Visual field cut   5. Anxiety   6. Other right-sided nontraumatic intracerebral hemorrhage (HCC)   7. Memory deficit       Patient Instructions  Continue current mediations including Keppra 500 mg twice daily. Will ask her PCP to draw level at her next visit. Continue to follow up with PCP  Referral for formal neuropsychiatric evaluation  Patient to send [place] her complete medication list Return in 6 months or sooner if worse    Orders Placed This Encounter  Procedures   Ambulatory referral to Neuropsychology    Meds ordered this encounter  Medications   levETIRAcetam (KEPPRA) 500 MG tablet    Sig: Take 1 tablet (500 mg total) by mouth 2 (two) times daily.    Dispense:  180 tablet    Refill:  3   cyclobenzaprine (FLEXERIL) 10 MG tablet    Sig: Take 1 tablet (10 mg total) by mouth 3 (three) times daily as needed for muscle spasms.    Dispense:  30 tablet    Refill:  0    Return in about 6 months (around 10/21/2023).    Windell Norfolk, MD 04/22/2023, 1:08 PM  [facility] [address], [address] [place], [place] [address] [phone]

## 2023-04-22 NOTE — Patient Instructions (Signed)
Continue current mediations including Keppra 500 mg twice daily. Will ask her PCP to draw level at her next visit. Continue to follow up with PCP  Referral for formal neuropsychiatric evaluation  Patient to send [place] her complete medication list Return in 6 months or sooner if worse

## 2023-04-23 ENCOUNTER — Telehealth: Payer: Self-pay | Admitting: Neurology

## 2023-04-23 NOTE — Telephone Encounter (Signed)
Referral for neuropsychology fax to [facility]. Phone:[phone], Fax: [phone]

## 2023-04-25 ENCOUNTER — Other Ambulatory Visit (HOSPITAL_COMMUNITY): Payer: Self-pay

## 2023-05-14 ENCOUNTER — Ambulatory Visit (INDEPENDENT_AMBULATORY_CARE_PROVIDER_SITE_OTHER): Payer: 59 | Admitting: Clinical

## 2023-05-14 ENCOUNTER — Encounter (HOSPITAL_COMMUNITY): Payer: Self-pay | Admitting: Clinical

## 2023-05-14 DIAGNOSIS — F064 Anxiety disorder due to known physiological condition: Secondary | ICD-10-CM

## 2023-05-14 DIAGNOSIS — F0631 Mood disorder due to known physiological condition with depressive features: Secondary | ICD-10-CM

## 2023-05-14 DIAGNOSIS — I619 Nontraumatic intracerebral hemorrhage, unspecified: Secondary | ICD-10-CM
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
## 2023-05-16 ENCOUNTER — Telehealth: Payer: Self-pay | Admitting: Hematology and Oncology

## 2023-05-16 NOTE — Telephone Encounter (Signed)
Patient is aware of rescheduled appointment times/dates 

## 2023-05-22 ENCOUNTER — Encounter (HOSPITAL_COMMUNITY): Payer: Self-pay | Admitting: Clinical

## 2023-05-22 ENCOUNTER — Ambulatory Visit (INDEPENDENT_AMBULATORY_CARE_PROVIDER_SITE_OTHER): Payer: 59 | Admitting: Clinical

## 2023-05-22 DIAGNOSIS — F0631 Mood disorder due to known physiological condition with depressive features: Secondary | ICD-10-CM | POA: Diagnosis not present

## 2023-05-22 DIAGNOSIS — I619 Nontraumatic intracerebral hemorrhage, unspecified: Secondary | ICD-10-CM | POA: Diagnosis not present

## 2023-05-22 DIAGNOSIS — F064 Anxiety disorder due to known physiological condition: Secondary | ICD-10-CM
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
## 2023-05-28 ENCOUNTER — Ambulatory Visit (INDEPENDENT_AMBULATORY_CARE_PROVIDER_SITE_OTHER): Payer: 59 | Admitting: Clinical

## 2023-05-28 DIAGNOSIS — Z91198 Patient's noncompliance with other medical treatment and regimen for other reason: Secondary | ICD-10-CM
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
## 2023-06-04 ENCOUNTER — Encounter (HOSPITAL_COMMUNITY): Payer: Self-pay | Admitting: Clinical

## 2023-06-04 ENCOUNTER — Ambulatory Visit (INDEPENDENT_AMBULATORY_CARE_PROVIDER_SITE_OTHER): Payer: 59 | Admitting: Clinical

## 2023-06-04 DIAGNOSIS — F064 Anxiety disorder due to known physiological condition: Secondary | ICD-10-CM | POA: Diagnosis not present

## 2023-06-04 DIAGNOSIS — F411 Generalized anxiety disorder: Secondary | ICD-10-CM

## 2023-06-04 DIAGNOSIS — F0631 Mood disorder due to known physiological condition with depressive features: Secondary | ICD-10-CM | POA: Diagnosis not present
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
## 2023-06-09 ENCOUNTER — Other Ambulatory Visit: Payer: Self-pay

## 2023-06-09 MED ORDER — CYCLOBENZAPRINE HCL 10 MG PO TABS: 10.0000 mg | ORAL_TABLET | Freq: Three times a day (TID) | ORAL | 0 refills | Status: AC | PRN

## 2023-06-09 NOTE — Progress Notes (Unsigned)
Call to patient to offer 08/22/23 appointment, patient accepted. Patient request refill of flexeril. RX request sent to Dr. Teresa Coombs for review. Keppra has current refills attached.

## 2023-06-11 ENCOUNTER — Ambulatory Visit (INDEPENDENT_AMBULATORY_CARE_PROVIDER_SITE_OTHER): Payer: 59 | Admitting: Clinical

## 2023-06-11 DIAGNOSIS — F0631 Mood disorder due to known physiological condition with depressive features: Secondary | ICD-10-CM

## 2023-06-11 DIAGNOSIS — F064 Anxiety disorder due to known physiological condition: Secondary | ICD-10-CM

## 2023-06-11 DIAGNOSIS — I619 Nontraumatic intracerebral hemorrhage, unspecified: Secondary | ICD-10-CM

## 2023-06-12 ENCOUNTER — Encounter (HOSPITAL_COMMUNITY): Payer: Self-pay | Admitting: Clinical

## 2023-06-12 NOTE — Progress Notes (Signed)
THERAPIST PROGRESS NOTE  Session Time:   4:05-5:05pm   Session #19  Participation Level: Active  Behavioral Response: Casual and Neat Alert Irritable   Type of Therapy: Individual Therapy  Treatment Goals addressed:  Goal: LTG: Jolett will score less than 5 on the Generalized Anxiety Disorder 7 Scale (GAD-7) Goal: STG: Artice will practice problem solving skills 3 times per week for the next 4 weeks. Goal: STG: Promyse will reduce frequency of avoidant behaviors by 50% as evidenced by self-report in therapy sessions Goal: LTG: Asheli will be able to acknowledge and accept her new way of life with the brain injury 5 days out of 7 Goal: STG: Julianny will practice how to handle anxiety-provoking situations where she is faced with sharing information about her brain injury and/or making decisions Goal: LTG: Reduce frequency, intensity, and duration of depression symptoms so that daily functioning is improved Goal: STG: Reduce overall depression score by a minimum of 25% on the Patient Health Questionnaire (PHQ-9) Goal: STG: Ganelle will identify cognitive patterns and beliefs that support depression Goal: STG: Genesia will practice behavioral activation skills 1-2 times per week for the next 26 weeks Goal: LTG: Jahzarra will be able to identify reasons her brain injury can cause depression symptoms and recognize when this is happening. Goal: STG: Vandy will be able to talk about her brain injury without becoming overwhelmed with negative emotion. Goal: STG: Keyatta will identify 3-5 behaviors that actually indicate social isolation Goal: LTG: Leyli will engage in at least 1 social activity outside of family per month Goal: STG: Syrah will learn about boundaries and how to set them AEB her ability to successful set boundaries by self-report  ProgressTowards Goals: Progressing  Interventions: CBT and Supportive   Summary: April Reilly is a 52 y.o. 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  She presented oriented x5 and stated she was feeling "irritated."  CSW evaluated patient's medication compliance, use of coping tools, and self-care, as applicable. She reported that she has been having more migraines recently and does have an upcoming early appointment with Dr. Teresa Coombs to address this, was grateful for the intervention which led to this appointment.  She is afraid to go to sleep when she has a migraine because she is afraid she will die in her sleep, therefore migraines are usually accompanied by lack of sleep.  She has been journaling into her phone and her notebooks more, and read some of the entries to CSW.  One read, "I love the old me.  The new me can be mean sometimes.  I have to stop and think so I do not do anything crazy."  She said sometimes she feels like a tornado. She feels like if she allowed herself to do so, she could just flip everything in her room over.  Because she does not want bad consequences she tries really hard not to get angry.  She now admits that Dr. Teresa Coombs is correct to state that she is not ready to go back to work.  She gets angry when she cannot remember something or cannot hear something.  She also gets angry when a show or music remind her of April Reilly who is no longer here.  She reported that her thoughts when she cannot remember things immediately goes to, "This bitch did this to me" meaning the doctor who prescribed the medicine that hurt her.  Other times, she immediately  thinks poorly of herself, such as "I was so stupid to take that medicine without researching it."  Both of these thoughts were challenged through the CBT model and she was eventually able to concede that there are replacement thoughts to both of those that are more reality-based, more helpful, and more beneficial.    Suicidal/Homicidal: No without intent  Therapist Response:    Patient is progressing AEB engaging in scheduled therapy session.  Throughout the session, CSW gave patient the opportunity to explore thoughts and feelings associated with current life situations and past/present stressors.   CSW challenged patient gently and appropriately to consider different ways of looking at reported issues. CSW encouraged patient's expression of feelings and validated these using empathy, active listening, open body language, and unconditional positive regard.   CSW encouraged patient to schedule more therapy sessions for the future, as needed.     Recommendations:  Return to therapy next week, engage in self care behaviors, consider the thoughts she has that lead to anger as to whether they are accurate, factual, and helpful  Plan: Return again in 1 week on 11/27  Diagnosis:  No diagnosis found.  Collaboration of Care: Other provider involved in patient's care AEB - CSW communicates via SecureChat in Epic with neurologist as needed  Patient/Guardian was advised Release of Information must be obtained prior to any record release in order to collaborate their care with an outside provider. Patient/Guardian was advised if they have not already done so to contact the registration department to sign all necessary forms in order for [place] to release information regarding their care.   Consent: Patient/Guardian gives verbal consent for treatment and assignment of benefits for services provided during this visit. Patient/Guardian expressed understanding and agreed to proceed.   [name], LCSW 06/12/2023

## 2023-06-18 ENCOUNTER — Encounter (HOSPITAL_COMMUNITY): Payer: Self-pay | Admitting: Clinical

## 2023-06-18 ENCOUNTER — Ambulatory Visit (INDEPENDENT_AMBULATORY_CARE_PROVIDER_SITE_OTHER): Payer: 59 | Admitting: Clinical

## 2023-06-18 DIAGNOSIS — I619 Nontraumatic intracerebral hemorrhage, unspecified: Secondary | ICD-10-CM | POA: Diagnosis not present

## 2023-06-18 DIAGNOSIS — F064 Anxiety disorder due to known physiological condition: Secondary | ICD-10-CM | POA: Diagnosis not present

## 2023-06-18 DIAGNOSIS — F0631 Mood disorder due to known physiological condition with depressive features: Secondary | ICD-10-CM

## 2023-06-18 NOTE — Progress Notes (Signed)
THERAPIST PROGRESS NOTE  Session Time:   4:02-5:00pm   Session #20  Participation Level: Active  Behavioral Response: Casual and Neat Alert Irritable   Type of Therapy: Individual Therapy with another therapist shadowing with patient's permission  Treatment Goals addressed:  Goal: LTG: April Reilly will score less than 5 on the Generalized Anxiety Disorder 7 Scale (GAD-7) Goal: STG: April Reilly will practice problem solving skills 3 times per week for the next 4 weeks. Goal: STG: April Reilly will reduce frequency of avoidant behaviors by 50% as evidenced by self-report in therapy sessions Goal: LTG: April Reilly will be able to acknowledge and accept her new way of life with the brain injury 5 days out of 7 Goal: STG: April Reilly will practice how to handle anxiety-provoking situations where she is faced with sharing information about her brain injury and/or making decisions Goal: LTG: Reduce frequency, intensity, and duration of depression symptoms so that daily functioning is improved Goal: STG: Reduce overall depression score by a minimum of 25% on the Patient Health Questionnaire (PHQ-9) Goal: STG: April Reilly will identify cognitive patterns and beliefs that support depression Goal: STG: April Reilly will practice behavioral activation skills 1-2 times per week for the next 26 weeks Goal: LTG: April Reilly will be able to identify reasons her brain injury can cause depression symptoms and recognize when this is happening. Goal: STG: April Reilly will be able to talk about her brain injury without becoming overwhelmed with negative emotion. Goal: STG: April Reilly will identify 3-5 behaviors that actually indicate social isolation Goal: LTG: April Reilly will engage in at least 1 social activity outside of family per month Goal: STG: April Reilly will learn about boundaries and how to set them AEB her ability to successful set boundaries by self-report  ProgressTowards Goals: Progressing  Interventions: Supportive and Reframing    Summary: April Reilly is a 52 y.o. 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  She presented oriented x5 and stated she was feeling "ups and downs."  CSW evaluated patient's medication compliance, use of coping tools, and self-care, as applicable.   She expressed a number of things that have angered her in the last week, all of which indicate a marked decrease in patience that she does not herself identify as a problem.  She expressed that her impatience makes sense and that anyone in her situation would feel the same way.  Trying to help her see it from another person's perspective was not successful.  She is able to be teased about it, however.  She perseverated again over the neurologist not letting her go back to work, and this was processed.  She did ultimately concede that with her current level of patience, she would have problems in the workplace.  She stated she is crying a lot which is very unlike her, saying "I was never a crybaby."  Self-compassion was encouraged through asking her how she would feel about one of her daughters or a friend being upset and tearful.  She plans to go to [place] soon for a doctor's appointment because her ankles continue to swell.  The doctor there will fit her in on short notice.  She was upset to find that her upcoming visits will not start until 1/29 because she has not heeded CSW's reminders to schedule more appointments.  She was informed that a group is being scheduled to be able to be seen weekly in the interim, but she adamantly refused even after  CSW explained that she would not have to speak, would find out how much she has in common with others, and such other reasons that groups are effective.  She responded that there is nobody in the entire world like her, because her stroke was "one-of-a-kind" according to the neurologist.  She would not accept that even though  the event was unique, her feelings are similar to others.   Suicidal/Homicidal: No without intent  Therapist Response:   Patient is progressing AEB engaging in scheduled therapy session.  Throughout the session, CSW gave patient the opportunity to explore thoughts and feelings associated with current life situations and past/present stressors.   CSW challenged patient gently and appropriately to consider different ways of looking at reported issues. CSW encouraged patient's expression of feelings and validated these using empathy, active listening, open body language, and unconditional positive regard.   CSW encouraged patient to schedule more therapy sessions for the future, as needed.     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 Patient/Guardian was advised if they have not already done so to contact the registration department to sign all necessary forms in order for [place] to release information regarding their care.   Consent: Patient/Guardian gives verbal consent for treatment and assignment of benefits for services provided during this visit. Patient/Guardian expressed understanding and agreed to proceed.   April Reilly Chad, LCSW 06/18/2023

## 2023-06-20 ENCOUNTER — Ambulatory Visit: Payer: 59 | Admitting: Hematology and Oncology

## 2023-06-24 ENCOUNTER — Inpatient Hospital Stay: Payer: 59 | Attending: Hematology and Oncology | Admitting: Hematology and Oncology

## 2023-06-25 NOTE — Telephone Encounter (Signed)
Received new paperwork form the [employer] disability. Reached back out to patient who stated she was unable to make the form payment at this time. She asked that I reach back out after the new year.

## 2023-07-02 ENCOUNTER — Encounter (HOSPITAL_COMMUNITY): Payer: Self-pay | Admitting: Clinical

## 2023-07-02 ENCOUNTER — Ambulatory Visit (INDEPENDENT_AMBULATORY_CARE_PROVIDER_SITE_OTHER): Payer: 59 | Admitting: Clinical

## 2023-07-02 DIAGNOSIS — F0631 Mood disorder due to known physiological condition with depressive features: Secondary | ICD-10-CM

## 2023-07-02 DIAGNOSIS — F064 Anxiety disorder due to known physiological condition: Secondary | ICD-10-CM

## 2023-07-02 DIAGNOSIS — I619 Nontraumatic intracerebral hemorrhage, unspecified: Secondary | ICD-10-CM

## 2023-07-02 NOTE — Progress Notes (Signed)
THERAPIST PROGRESS NOTE  Session Time:   4:02-5:02pm   Session #21  Participation Level: Active  Behavioral Response: Casual and Neat Alert Negative   Type of Therapy: Individual Therapy   Treatment Goals addressed:  Goal: LTG: Yachiyo will score less than 5 on the Generalized Anxiety Disorder 7 Scale (GAD-7) Goal: STG: Jesselynn will practice problem solving skills 3 times per week for the next 4 weeks. Goal: STG: Almarie will reduce frequency of avoidant behaviors by 50% as evidenced by self-report in therapy sessions Goal: LTG: Damoni will be able to acknowledge and accept her new way of life with the brain injury 5 days out of 7 Goal: STG: Lyla will practice how to handle anxiety-provoking situations where she is faced with sharing information about her brain injury and/or making decisions Goal: LTG: Reduce frequency, intensity, and duration of depression symptoms so that daily functioning is improved Goal: STG: Reduce overall depression score by a minimum of 25% on the Patient Health Questionnaire (PHQ-9) Goal: STG: Satia will identify cognitive patterns and beliefs that support depression Goal: STG: Donnisha will practice behavioral activation skills 1-2 times per week for the next 26 weeks Goal: LTG: Twylia will be able to identify reasons her brain injury can cause depression symptoms and recognize when this is happening. Goal: STG: Dariane will be able to talk about her brain injury without becoming overwhelmed with negative emotion. Goal: STG: Munachiso will identify 3-5 behaviors that actually indicate social isolation Goal: LTG: Shravya will engage in at least 1 social activity outside of family per month Goal: STG: Kayleighann will learn about boundaries and how to set them AEB her ability to successful set boundaries by self-report  ProgressTowards Goals: Progressing  Interventions: Assertiveness Training and Supportive   Summary: April Reilly is a 52 y.o. 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  She presented oriented x5 and stated she was feeling "paranoid, can hear a phone again through my ears, feel like somebody is following me."  CSW evaluated patient's medication compliance, use of coping tools, and self-care, as applicable.   She asked CSW to read her journal as it expressed how she has been feeling lately.  She has been upset with her daughter just assuming she will take charge of her grandson's needs, feels her daughter she fulfill those duties since she is the mother.  She talked with enthusiasm about the upcoming Christmas holiday.  She then talked about watching a documentary about a celebrity with a stroke on TV, stated she had to watch it twice to remember parts of it since she has such memory problems.  It makes her angry when she hears about other people having strokes and going back to work.  Random people will come up to her and tell her of such incidents, and she wants to say to them, "Why is it your f----ing business?"  CSW provided her with more polite things to say, such as, "You're not aware of my medical situation, so you don't know why that is not possible for me" or "Well, that is good for them; my situation is different."  She was somewhat resistant to the idea of being polite when she feels people are being so rude.  She processed also the help she is providing to a friend, saying for herself without having to be prompted that she needs to help herself first and foremost at this point.  She  was given Suicide Prevention Education and was provided with mobile crisis number.  Suicidal/Homicidal: No without intent  Therapist Response:   Patient is progressing AEB engaging in scheduled therapy session.  Throughout the session, CSW gave patient the opportunity to explore thoughts and feelings associated with current life situations and past/present  stressors.   CSW challenged patient gently and appropriately to consider different ways of looking at reported issues. CSW encouraged patient's expression of feelings and validated these using empathy, active listening, open body language, and unconditional positive regard.   CSW encouraged patient to schedule more therapy sessions for the future, as needed.     Recommendations:  Return to therapy in 2 weeks, engage in self care behaviors, prioritize her own self-care over her family's or her friend's, practice saying a polite response in case others do continue to make comments about why she has not gone back to work.  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 Patient/Guardian was advised if they have not already done so to contact the registration department to sign all necessary forms in order for [place] to release information regarding their care.   Consent: Patient/Guardian gives verbal consent for treatment and assignment of benefits for services provided during this visit. Patient/Guardian expressed understanding and agreed to proceed.   [name], LCSW 07/02/2023

## 2023-07-15 ENCOUNTER — Ambulatory Visit (INDEPENDENT_AMBULATORY_CARE_PROVIDER_SITE_OTHER): Payer: 59 | Admitting: Clinical

## 2023-07-15 ENCOUNTER — Encounter (HOSPITAL_COMMUNITY): Payer: Self-pay | Admitting: Clinical

## 2023-07-15 DIAGNOSIS — F0631 Mood disorder due to known physiological condition with depressive features: Secondary | ICD-10-CM

## 2023-07-15 DIAGNOSIS — I619 Nontraumatic intracerebral hemorrhage, unspecified: Secondary | ICD-10-CM

## 2023-07-15 DIAGNOSIS — F064 Anxiety disorder due to known physiological condition: Secondary | ICD-10-CM | POA: Diagnosis not present

## 2023-07-15 NOTE — Progress Notes (Unsigned)
THERAPIST PROGRESS NOTE  Session Time:   10:01-11:78M  Session #22  Virtual Visit via Video Note  I connected with April Reilly on 07/15/23 at 10:00 AM EST by a video enabled telemedicine application and verified that I am speaking with the correct person using two identifiers.  Location: Patient: home Provider: private office   I discussed the limitations of evaluation and management by telemedicine and the availability of in person appointments. The patient expressed understanding and agreed to proceed.   I discussed the assessment and treatment plan with the patient. The patient was provided an opportunity to ask questions and all were answered. The patient agreed with the plan and demonstrated an understanding of the instructions.   The patient was advised to call back or seek an in-person evaluation if the symptoms worsen or if the condition fails to improve as anticipated.  I provided 59 minutes of non-face-to-face time during this encounter.  [name], LCSW   Participation Level: Active  Behavioral Response: Casual and Neat Alert Negative   Type of Therapy: Individual Therapy   Treatment Goals addressed:  Goal: LTG: April Reilly will score less than 5 on the Generalized Anxiety Disorder 7 Scale (GAD-7) Goal: STG: April Reilly will practice problem solving skills 3 times per week for the next 4 weeks. Goal: STG: April Reilly will reduce frequency of avoidant behaviors by 50% as evidenced by self-report in therapy sessions Goal: LTG: April Reilly will be able to acknowledge and accept her new way of life with the brain injury 5 days out of 7 Goal: STG: April Reilly will practice how to handle anxiety-provoking situations where she is faced with sharing information about her brain injury and/or making decisions Goal: LTG: Reduce frequency, intensity, and duration of depression symptoms so that daily functioning is improved Goal: STG: Reduce overall depression score by a minimum of 25%  on the Patient Health Questionnaire (PHQ-9) Goal: STG: April Reilly will practice behavioral activation skills 1-2 times per week for the next 26 weeks Goal: LTG: April Reilly will be able to identify reasons her brain injury can cause depression symptoms and recognize when this is happening. Goal: STG: April Reilly will be able to talk about her brain injury without becoming overwhelmed with negative emotion. Goal: STG: April Reilly will identify 3-5 behaviors that actually indicate social isolation Goal: LTG: April Reilly will engage in at least 1 social activity outside of family per month Goal: STG: April Reilly will learn about boundaries and how to set them AEB her ability to successful set boundaries by self-report  ProgressTowards Goals: Progressing  Interventions: Assertiveness Training and Supportive   Summary: April Reilly is a 52 y.o. 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  She presented oriented x5 and stated she was feeling "sad and anxious."  CSW evaluated patient's medication compliance, use of coping tools, and self-care, as applicable.   The session was spent processing how her holidays are this year without her long-term partner who died unexpectedly.  She does not have the money she previously has spent on Christmas, because of not having her employment income, overtime, or partner's assistance.  She stated she realizes in the new year she has to move to a less expensive location because her disability income is not enough to keep staying where she is.  Her bills continue to increase and food has become very expensive., so she is struggling financially.  In 2025 she wants to "get back to myself" and  not get so upset.  She wants to work on ways to remember things as they happen, realizes she may never regain past memories, although she can relive them through videos and pictures.  She wants to develop ways to handle  memory problems and still be able to function in a job that she enjoys.  She wants to work on being able to sleep at night instead of not being able to sleep until it becomes light outside because of fear of waking up in the hospital again.  She stated that a friend recently asked if she is always going to be so skeptical of medicine and asked CSW if she will.  CSW told her it is possible, given the trauma associated with medicine that she took that has so drastically impacted her life.    She is experiencing some physical symptoms and a return of hearing the ocean in her ear all the time.  Suicidal/Homicidal: No without intent/plan  Therapist Response:   Patient is progressing AEB engaging in scheduled therapy session.  Throughout the session, CSW gave patient the opportunity to explore thoughts and feelings associated with current life situations and past/present stressors.   CSW challenged patient gently and appropriately to consider different ways of looking at reported issues. CSW encouraged patient's expression of feelings and validated these using empathy, active listening, open body language, and unconditional positive regard.   CSW encouraged patient to schedule more therapy sessions for the future, as needed.  It is possible patient may consider upcoming groups since her next appointment is 4 weeks away.     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 Patient/Guardian  was advised if they have not already done so to contact the registration department to sign all necessary forms in order for [place] to release information regarding their care.   Consent: Patient/Guardian gives verbal consent for treatment and assignment of benefits for services provided during this visit. Patient/Guardian expressed understanding and agreed to proceed.   [name], LCSW 07/15/2023

## 2023-08-06 ENCOUNTER — Encounter (HOSPITAL_COMMUNITY): Payer: Self-pay | Admitting: Clinical

## 2023-08-06 ENCOUNTER — Ambulatory Visit (INDEPENDENT_AMBULATORY_CARE_PROVIDER_SITE_OTHER): Payer: 59 | Admitting: Clinical

## 2023-08-06 DIAGNOSIS — I619 Nontraumatic intracerebral hemorrhage, unspecified: Secondary | ICD-10-CM

## 2023-08-06 DIAGNOSIS — F064 Anxiety disorder due to known physiological condition: Secondary | ICD-10-CM | POA: Diagnosis not present

## 2023-08-06 DIAGNOSIS — F0631 Mood disorder due to known physiological condition with depressive features: Secondary | ICD-10-CM | POA: Diagnosis not present

## 2023-08-06 NOTE — Progress Notes (Signed)
THERAPIST PROGRESS NOTE  Session Time:   1:00-12:00pm  Session #23  Participation Level: Active  Behavioral Response: Casual and Neat Alert Negative   Type of Therapy: Individual Therapy   Treatment Goals addressed:  Goal: LTG: Maybeline will score less than 5 on the Generalized Anxiety Disorder 7 Scale (GAD-7) Goal: STG: Delana will practice problem solving skills 3 times per week for the next 4 weeks. Goal: STG: Violet will reduce frequency of avoidant behaviors by 50% as evidenced by self-report in therapy sessions Goal: LTG: Meryn will be able to acknowledge and accept her new way of life with the brain injury 5 days out of 7 Goal: STG: Jalya will practice how to handle anxiety-provoking situations where she is faced with sharing information about her brain injury and/or making decisions Goal: LTG: Reduce frequency, intensity, and duration of depression symptoms so that daily functioning is improved Goal: STG: Reduce overall depression score by a minimum of 25% on the Patient Health Questionnaire (PHQ-9) Goal: STG: Vonita will practice behavioral activation skills 1-2 times per week for the next 26 weeks Goal: LTG: Shareta will be able to identify reasons her brain injury can cause depression symptoms and recognize when this is happening. Goal: STG: Corliss will be able to talk about her brain injury without becoming overwhelmed with negative emotion. Goal: STG: Lulu will identify 3-5 behaviors that actually indicate social isolation Goal: LTG: Esa will engage in at least 1 social activity outside of family per month Goal: STG: Amiyha will learn about boundaries and how to set them AEB her ability to successful set boundaries by self-report  ProgressTowards Goals: Progressing  Interventions: Assertiveness Training and Supportive   Summary: April Reilly is a 53 y.o. 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  She presented oriented x5 and stated she was feeling "aggravated."  She was very grateful to be called for a last minute opening because she stated "I'm about to lose my mind."  CSW evaluated patient's medication compliance, use of coping tools, and self-care, as applicable.   She has had increasing leg pain and tried getting an appointment at the sickle cell clinic, albeit without success.  We discussed the possibility of her returning to [place] to see her doctor there who has always treated it.  She could do that in February.  CSW encouraged her to go to the ED if the pain is too great before then, but she refuses because of her distrust of [facility], specifically.  We processed what has been causing a lot of her increased anxiety and agitation and it appears to be related to her use of social media, especially looking at all the comments posted about an item, whether that is an article, a video, or a picture.  CSW pointed out how this eventually increases her social isolation and she was in agreement that she will "stay off Instagram except for the Bible verses."  She is still spending inordinate amounts of time in bed, was encouraged to get out and do active things when she can.  She attributed some of her excessive bed occupation to stress from family members.  There is a friend who is living in [place] during a divorce and told her she could come there and live with her.  She is actually considering this, as it would put her a little distance from her children and grandchildren who would then stop  making demands of her that stress her out.  CSW encouraged her to use her 5 senses to help not only with her agitation, but also when she is having the pain.  She stated that she likes the scent of lemon and could use that as well as a lamp on her bedside table to calm down.  It has been awhile since she has been seen and she does not like going that long, feels  that she needs the weekly therapy sessions.  She was informed about the interim group sessions that are available when needed.  Suicidal/Homicidal: No without intent/plan  Therapist Response:   Patient is progressing AEB engaging in scheduled therapy session.  Throughout the session, CSW gave patient the opportunity to explore thoughts and feelings associated with current life situations and past/present stressors.   CSW challenged patient gently and appropriately to consider different ways of looking at reported issues. CSW encouraged patient's expression of feelings and validated these using empathy, active listening, open body language, and unconditional positive regard.   PHQ-9 and GAD-7 need to be administered.     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 Patient/Guardian was advised if they have not already done so to contact the registration department to sign all necessary forms in order for [place] to release information regarding their care.   Consent: Patient/Guardian gives verbal consent for treatment and assignment of benefits for services provided during this visit. Patient/Guardian expressed understanding and agreed to proceed.   [name], LCSW 08/06/2023

## 2023-08-20 ENCOUNTER — Ambulatory Visit (INDEPENDENT_AMBULATORY_CARE_PROVIDER_SITE_OTHER): Payer: Self-pay | Admitting: Neurology

## 2023-08-20 ENCOUNTER — Encounter: Payer: Self-pay | Admitting: Neurology

## 2023-08-20 ENCOUNTER — Ambulatory Visit (INDEPENDENT_AMBULATORY_CARE_PROVIDER_SITE_OTHER): Payer: 59 | Admitting: Clinical

## 2023-08-20 ENCOUNTER — Encounter (HOSPITAL_COMMUNITY): Payer: Self-pay | Admitting: Clinical

## 2023-08-20 ENCOUNTER — Other Ambulatory Visit: Payer: Self-pay | Admitting: Neurology

## 2023-08-20 VITALS — BP 128/84 | HR 86 | Ht 60.0 in | Wt 171.0 lb

## 2023-08-20 DIAGNOSIS — R569 Unspecified convulsions: Secondary | ICD-10-CM

## 2023-08-20 DIAGNOSIS — G936 Cerebral edema: Secondary | ICD-10-CM

## 2023-08-20 DIAGNOSIS — F0631 Mood disorder due to known physiological condition with depressive features: Secondary | ICD-10-CM | POA: Diagnosis not present

## 2023-08-20 DIAGNOSIS — I619 Nontraumatic intracerebral hemorrhage, unspecified: Secondary | ICD-10-CM

## 2023-08-20 DIAGNOSIS — F419 Anxiety disorder, unspecified: Secondary | ICD-10-CM | POA: Diagnosis not present

## 2023-08-20 DIAGNOSIS — G08 Intracranial and intraspinal phlebitis and thrombophlebitis: Secondary | ICD-10-CM | POA: Diagnosis not present

## 2023-08-20 DIAGNOSIS — F064 Anxiety disorder due to known physiological condition: Secondary | ICD-10-CM | POA: Diagnosis not present

## 2023-08-20 DIAGNOSIS — G44229 Chronic tension-type headache, not intractable: Secondary | ICD-10-CM | POA: Diagnosis not present

## 2023-08-20 MED ORDER — LAMOTRIGINE ER 25 MG PO TB24: ORAL_TABLET | ORAL | 0 refills | Status: DC

## 2023-08-20 MED ORDER — GABAPENTIN 300 MG PO CAPS: 300.0000 mg | ORAL_CAPSULE | Freq: Every day | ORAL | 0 refills | Status: DC

## 2023-08-20 MED ORDER — CYCLOBENZAPRINE HCL 10 MG PO TABS: 10.0000 mg | ORAL_TABLET | Freq: Three times a day (TID) | ORAL | 3 refills | Status: DC | PRN

## 2023-08-20 MED ORDER — IBUPROFEN 800 MG PO TABS: 800.0000 mg | ORAL_TABLET | Freq: Three times a day (TID) | ORAL | 0 refills | Status: DC | PRN

## 2023-08-20 MED ORDER — LAMOTRIGINE ER 200 MG PO TB24: 200.0000 mg | ORAL_TABLET | Freq: Every day | ORAL | 11 refills | Status: DC

## 2023-08-20 NOTE — Progress Notes (Signed)
THERAPIST PROGRESS NOTE  Session Time:   4:00pm-5:00pm  Session #24  Participation Level: Active  Behavioral Response: Casual and Neat Alert Negative   Type of Therapy: Individual Therapy   Treatment Goals addressed:  Goal: LTG: Kassandra will score less than 5 on the Generalized Anxiety Disorder 7 Scale (GAD-7) Goal: STG: Joanny will practice problem solving skills 3 times per week for the next 4 weeks. Goal: STG: Monasia will reduce frequency of avoidant behaviors by 50% as evidenced by self-report in therapy sessions Goal: LTG: Keonda will be able to acknowledge and accept her new way of life with the brain injury 5 days out of 7 Goal: STG: Starlynn will practice how to handle anxiety-provoking situations where she is faced with sharing information about her brain injury and/or making decisions Goal: LTG: Reduce frequency, intensity, and duration of depression symptoms so that daily functioning is improved Goal: STG: Reduce overall depression score by a minimum of 25% on the Patient Health Questionnaire (PHQ-9) Goal: STG: Xandria will practice behavioral activation skills 1-2 times per week for the next 26 weeks Goal: LTG: Chaquita will be able to identify reasons her brain injury can cause depression symptoms and recognize when this is happening. Goal: STG: Jeremie will be able to talk about her brain injury without becoming overwhelmed with negative emotion. Goal: STG: Caydance will identify 3-5 behaviors that actually indicate social isolation Goal: LTG: Amylah will engage in at least 1 social activity outside of family per month Goal: STG: Itzamara will learn about boundaries and how to set them AEB her ability to successful set boundaries by self-report  ProgressTowards Goals: Progressing  Interventions: Assertiveness Training and Supportive   Summary: April Reilly is a 53 y.o. 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  She presented oriented x5 and stated she was feeling "okay just this pain in my legs and migraines."  CSW evaluated patient's medication compliance, use of coping tools, and self-care, as applicable.   She shared that she saw her neurologist earlier than scheduled because they had a cancellation and the doctor is agreeable to referring her to the sickle cell clinic but she needed the name of the doctor.  CSW provided this to her.  She also shared that the neurologist wanted to switch her off the Keppra and she is finally agreeable to this.  Most of the session was spent in her talking about a man she newly started dating but she does not like several things he is doing.  CSW went over boundaries again and encouraged her to observe pre-planned, healthy boundaries right from the start in this relationship.  She has almost decided to just tell him to leave her alone.  We processed her feelings about dating again after the previous partner's death and about how this new man was treating her.  Suicidal/Homicidal: No without intent/plan  Therapist Response:   Patient is progressing AEB engaging in scheduled therapy session.  Throughout the session, CSW gave patient the opportunity to explore thoughts and feelings associated with current life situations and past/present stressors.   CSW challenged patient gently and appropriately to consider different ways of looking at reported issues. CSW encouraged patient's expression of feelings and validated these using empathy, active listening, open body language, and unconditional positive regard.   PHQ-9 and GAD-7 need to be administered.     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 Patient/Guardian was advised if they have not already done so to contact the registration department to sign all necessary forms in order for [place] to release information regarding their care.   Consent: Patient/Guardian gives verbal consent for treatment and assignment of benefits for services provided during this visit. Patient/Guardian expressed understanding and agreed to proceed.   [name], LCSW 08/20/2023

## 2023-08-20 NOTE — Progress Notes (Signed)
[facility]  PATIENT: April Reilly DOB: October 18, 1970  REQUESTING CLINICIAN: No ref. provider found HISTORY FROM: Patient REASON FOR VISIT: Headaches follow up   HISTORICAL  CHIEF COMPLAINT:  Chief Complaint  Patient presents with   Follow-up    Pt in 12, here alone  Pt is here for seizure follow up. Pt states has not had any seizure activity. Pt states she has been having bad headaches.    INTERVAL HISTORY 08/20/2023:  Patient presents today for follow-up, she is alone.  Last visit was in October, since then she has not had any seizure or seizure-like activity.  She is compliant with Keppra 500 mg twice daily but has side effect of irritability but stated it is improving.  Still complaining of headaches, patient tells me that she is undergoing therapy to help with her anxiety and depression.  She has tried Ibuprofen 800 mg once and it was able to control her headaches. Currently her daughter and grandson are staying with her. Still having memory loss, uses a note book to keep track of her daily activity and keep important information.    INTERVAL HISTORY 04/22/2023:  Patient presents for follow-up, last visit was in July since then he has not had any seizure or seizure activity.  She is compliant with her medications including Keppra 500 mg twice daily and Flexeril.  She she is not sure if she is taking the amitriptyline or not. We repeated her EEG which showed right hemispheric slowing, no epileptiform discharges and no seizures.  At last visit, she was complaining of chronic headaches, we prescribed her Nurtec but it was not approved and patient started on Qulipta.  While taking the [place] she developed new hallucinations therefore discontinued.  Since discontinuing the Qulipta, she has not had any similar symptoms.  Her headaches, occur every other day.  She reports that Flexeril helps.  She still struggles with memory, reports that she wants to go back to work but  unsure if she can actually hold a job because now she has right   INTERVAL HISTORY 01/28/2023:  Patient presents today for follow-up, she is alone.  Last visit was in January and since then she has travelled to [place] and saw her PCP, had workup which was unrevealing.  She reports compliance with her medications, denies any seizure or seizure like activity.  She remains on the Keppra 500 mg twice daily.  She continues to complain of headaches, chronic headaches.  She has tried topiramate and amitriptyline without any benefit.  She continues on Motrin as needed for the headaches.  Reports that her headaches are almost daily.  She still reports anxiety, feels like her anxiety is worse when she is home alone by herself, sometimes she hears a phone ring when no phone is ringing.  She reports a few weeks ago her sister came and stay with her and during that time, she did not have any hallucinations, did not have any symptom of anxiety.  While she was in [place] also visiting family, she did not experience any hallucinations and no worsening anxiety while in the house but she will get anxious when she is in the crowd in public.   INTERVAL HISTORY 07/30/2022:  Patient presents today for follow-up, at last visit in October plan was to discontinue Topamax, start Elavil and Paxil.  Patient has not started those medications and she is still on Topamax.  She still complains of severe headaches about 2-3 times per week.  When  she had these bad headaches, she takes muscle relaxant.  She also reports increase in anxiety, trouble falling asleep, patient's reported she is scare to sleep because she lives alone.  Lately she has been sleeping with a loaded gun on her bed. She still has not set up care with a primary care physician   INTERVAL HISTORY 05/07/2022:  April Reilly presents today for follow-up, she is alone.  Since last visit she had an episode of severe vaginal bleeding, her Xarelto was discontinued.  Since she has  been anticoagulated for 8 months,  it was reasonable to discontinue Xarelto.  She is presenting today complaining of headache, at last visit we had increased her Topamax to 100 mg nightly but she still reports headache that usually start in the back of the neck.  She reported Flexeril helps but she ran out of medication.  She also tried Fioricet which helped her.  She denies any migrainous features, denies any nausea or vomiting.  She reported she is very anxious and worry about her brain injury, she cannot remember her seizure and worried about having another seizure.  Her vision is still affected, and wonder if she will get her vision back. She has not seen oph due to lack of insurance.    INTERVAL HISTORY 03/12/2022:  Patient presents today for follow-up, last visit was in June at that time plan was to continue with Eliquis and obtain a repeat CT venogram.  She reported she ran out of Eliquis and actually her insurance dropped her because of FMLA.  Luckily she did follow-up with Dr. Al Pimple, hematologist and patient was started on Xarelto.  She did have her CT venogram yesterday but pending official read.  She reports that she still having occasional headaches that is relieved by muscle relaxant, her vision is still poor and she is pending a ophthalmology follow-up next week.  Denies any recent fall.  Denies any new focal weakness.     INTERVAL HISTORY 12/24/21:  Patient presents today for follow-up, her CT venogram was repeated and it showed residual nonocclusive thrombus involving the distal right transverse and sigmoid sinus sinuses.,  Persistent but somewhat improved in appearance as compared to previous exam.  No new dural sinus thrombosis.  She remains on Eliquis 5 mg twice daily.  She did follow-up with primary care doctor and hematology.  She is pending a complete hematology work-up.  Currently she is complaining of left shoulder arm pain, some migraine headaches.  She ran out of some of her  medications more than a month ago and have not refill it.    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  She was initiated on anticoagulation.  Neurosurgery was consulted but patient did not have a craniectomy.  She developed seizures, started on Keppra  Her symptoms improve, she was discharge to acute rehab and her plan was to continue with anticoagulation for at least 6 months.  Patient report prior to her hospitalization she has been doing well, she has chronic anemia, sickle cell disease but she was not taken  on any medication.  She reports a few weeks prior to presentation she developed vaginal bleeding and she was started on megestrol acetate.  Since discharge from the hospital, daughter reported improvement of her mental status, confusion improved, she does continue with physical therapy and she is getting more independent.  She is compliant with all medications.  Patient reports prior to her hospitalization she was not taking any medication but since discharge she is on 13 medications, she feels like she does not need all of them.   Hospital course below. Dreamer Carillo is a 53 y.o. female with history of SS trait, uterine fibroid, abnormal vaginal bleeding x4 months who was admitted on 09/10/2021 with 3-day history of malaise, fall and progressive somnolence.  She was found to have RLL aspiration pneumonia as well as IPH due to right hemorrhagic infarct with edema and right frontal, parietal, occipital and temporal lobes and 9 mm right to left  midline shift.  She was started on hypertonic saline and EEG done showing evidence of epileptogenicity with high potential for seizures.  Work-up also revealed subocclusive clot in right distal right transverse sinus and right sigmoid dural venous sinus and she was started on IV heparin.  Dr. Roda Shutters felt that patient with large right frontoparietal venous infarct with hemorrhagic conversion due to unclear etiology.  Neurosurgery was consulted due to concerns about the herniation and recommended conservative care with serial CCT for monitoring. She was loaded with Dilantin due to concerns of seizure and placed on long-term EEG.  Dilantin was discontinued and Keppra added on 02/26.  Hypertonic saline was weaned off and mentation was slowly improving.  Repeat CTA/V of 02/27 showed evolving hemorrhagic infarct with mild improvement in mass effect and improved nonocclusive thrombus in right distal transverse and sigmoid sinus.  She was transition to DOAC on 02/28 with neurology recommending 3 to 6 months of AC as well as close neurological follow-up.  Hospital course was significant for issues with headaches, hypotension, tachycardia, left-sided weakness with balance deficits as well as cognitive deficits and disorientation.  CIR was recommended due to functional decline.    OTHER MEDICAL CONDITIONS: SS Trait, anemia    REVIEW OF SYSTEMS: Full 14 system review of systems performed and negative with exception of: as noted in the HPI   ALLERGIES: Allergies  Allergen Reactions   Megestrol Other (See Comments) and Swelling    Caused blood clot in brain  Caused blood clot in brain    Caused blood clot in brain Caused blood clot in brain   Qulipta [Atogepant] Anxiety    HALLUCINATION    HOME MEDICATIONS: Outpatient Medications Prior to Visit  Medication Sig Dispense Refill   aspirin EC 81 MG tablet Take 81 mg by mouth daily. Swallow whole.     diclofenac Sodium (VOLTAREN) 1 % GEL Place onto the skin.      levETIRAcetam (KEPPRA) 500 MG tablet Take 1 tablet (500 mg total) by mouth 2 (two) times daily. 180 tablet 3   PARoxetine (PAXIL) 10 MG tablet Take 1 tablet (10 mg total) by mouth daily. 30 tablet 3   amitriptyline (ELAVIL) 25 MG tablet TAKE 1 TABLET BY MOUTH EVERYDAY AT BEDTIME 90 tablet 2   cyclobenzaprine (FLEXERIL) 10 MG tablet Take 10 mg by mouth 3 (three) times daily as needed for muscle spasms.     No facility-administered medications prior to visit.    PAST MEDICAL HISTORY: Past Medical History:  Diagnosis Date   Asthma    Stroke (HCC)  Vaginal bleeding, abnormal    for 4 months    PAST SURGICAL HISTORY: Past Surgical History:  Procedure Laterality Date   FOOT SURGERY     TUBAL LIGATION      FAMILY HISTORY: Family History  Problem Relation Age of Onset   Clotting disorder Mother     SOCIAL HISTORY: Social History   Socioeconomic History   Marital status: Single    Spouse name: Not on file   Number of children: Not on file   Years of education: Not on file   Highest education level: Not on file  Occupational History   Not on file  Tobacco Use   Smoking status: Never   Smokeless tobacco: Never  Substance and Sexual Activity   Alcohol use: Not Currently   Drug use: Never   Sexual activity: Not Currently  Other Topics Concern   Not on file  Social History Narrative   Right handed   Caffeine ocassional   Lives alone with dog   Social Drivers of Health   Financial Resource Strain: Not on file  Food Insecurity: No Food Insecurity (01/01/2023)   Received from [employer] Insecurity    Within the past 12 months, you worried that your food would run out before you got the money to buy more.: Never true    Within the past 12 months, the food you bought just didn't last and you didn't have money to get more.: Never true  Transportation Needs: No Transportation Needs (01/01/2023)   Received from [employer] - Transportation    Lack  of Transportation (Medical): No    Lack of Transportation (Non-Medical): No  Physical Activity: Not on file  Stress: Low Risk  (02/27/2020)   Received from [facility]   Stress    Stress Risk: Not on file    General Anxiety Disorder (GAD-7) Score: Not on file  Social Connections: Not on file  Intimate Partner Violence: Not At Risk (01/01/2023)   Received from [facility]   Intimate Partner Violence    In the past 12 months, has the patient been fearful of being subjected to or has experienced physical/emotional abuse from an intimate partner, other family member , friend or domestic partner?: No    PHYSICAL EXAM  GENERAL EXAM/CONSTITUTIONAL: Vitals:  Vitals:   08/20/23 1149  BP: 128/84  Pulse: 86  Weight: 171 lb (77.6 kg)  Height: 5' (1.524 m)    Body mass index is 33.4 kg/m. Wt Readings from Last 3 Encounters:  08/20/23 171 lb (77.6 kg)  04/22/23 166 lb 8 oz (75.5 kg)  01/28/23 168 lb (76.2 kg)   Patient is in no distress; well developed, nourished and groomed; neck is supple  MUSCULOSKELETAL: Gait, strength, tone, movements noted in Neurologic exam below  NEUROLOGIC: MENTAL STATUS:      No data to display         awake, alert, oriented to person, place and time Difficulty with recent memory  normal attention and concentration language fluent, comprehension intact, naming intact fund of knowledge appropriate  CRANIAL NERVE:  2nd, 3rd, 4th, 6th - Left visual field cut, extraocular muscles intact, no nystagmus 5th - facial sensation symmetric 7th - facial strength symmetric 8th - hearing intact 9th - palate elevates symmetrically, uvula midline 11th - shoulder shrug symmetric 12th - tongue protrusion midline  MOTOR:  normal bulk and tone, full strength in the BUE, BLE  SENSORY:  normal and symmetric to  light touch  COORDINATION:  finger-nose-finger, fine finger movements normal  GAIT/STATION:  normal   DIAGNOSTIC DATA  (LABS, IMAGING, TESTING) - I reviewed patient records, labs, notes, testing and imaging myself where available.  Lab Results  Component Value Date   WBC 4.2 10/19/2021   HGB 12.5 10/19/2021   HCT 39.8 10/19/2021   MCV 78.7 (L) 10/19/2021   PLT 253 10/19/2021      Component Value Date/Time   NA 137 10/19/2021 1250   K 3.9 10/19/2021 1250   CL 106 10/19/2021 1250   CO2 23 10/19/2021 1250   GLUCOSE 92 10/19/2021 1250   BUN <5 (L) 10/19/2021 1250   CREATININE 0.76 10/19/2021 1250   CALCIUM 9.3 10/19/2021 1250   PROT 6.8 09/20/2021 0544   ALBUMIN 3.0 (L) 09/20/2021 0544   AST 53 (H) 09/20/2021 0544   ALT 80 (H) 09/20/2021 0544   ALKPHOS 83 09/20/2021 0544   BILITOT 0.3 09/20/2021 0544   GFRNONAA >60 10/19/2021 1250   Lab Results  Component Value Date   CHOL 139 09/11/2021   HDL 46 09/11/2021   LDLCALC 84 09/11/2021   TRIG 47 09/11/2021   CHOLHDL 3.0 09/11/2021   Lab Results  Component Value Date   HGBA1C 5.3 09/11/2021   Lab Results  Component Value Date   VITAMINB12 364 09/11/2021   Lab Results  Component Value Date   TSH 0.164 (L) 09/11/2021    CT Venogram 11/28/2021 1. 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 Previously seen blood products have resolved. 2. Residual nonocclusive thrombus involving the distal right transverse and sigmoid sinuses, persistent but somewhat improved in appearance as compared to previous exam. No new dural sinus thrombosis. 3. No other new acute intracranial abnormality.  CT Venogram 09/17/21. Evolving hemorrhagic infarction primarily involving temporal and occipital lobes with extension into the parietal lobe. No new hemorrhage. Mass effect is stable to mildly improved. Persistent but improved nonocclusive thrombus within the right distal transverse and sigmoid sinuses   Head CT 09/10/21 Large region of cortical/subcortical edema within the right  frontal, parietal, occipital and temporal lobes, as well as right insula and subinsular region. Superimposed patchy foci of acute parenchymal hemorrhage within the right parietal, occipital and temporal lobes. These findings are favored to reflect an acute/early subacute infarct from arterial ischemia (with hemorrhagic conversion) or a hemorrhagic infarct due to intracranial venous thrombosis. An MRI of the brain with contrast (including thin-slice T1-weighted post-contrast imaging for assessment of the intracranial venous structures) is recommended for further evaluation. A CTA of the head/neck should also be considered.   Associated mass effect with significant partial effacement of the right lateral ventricle and 9 mm leftward midline shift. The basal cisterns are effaced. Neurosurgical consultation is advised.   Moderate-volume extension of hemorrhage into the right lateral ventricle is questioned. No definite evidence of hydrocephalus or ventricular entrapment at this time   MRI Brain 09/10/21 1. Large area of cortical infarction in the right temporal, occipital, parietal, and posterior frontal lobes, with significant associated edema and redemonstrated intraparenchymal hemorrhage. The mass effect causes 11 mm of right-to-left midline shift, medial displacement of the right uncus and right temporal horn and effacement of the basal cisterns, unchanged from the prior CT and raising concern for right uncal herniation. 2. Additional focal area of restricted diffusion in the medial aspect of the right basal ganglia, suspected to be in the preoptic area of the hypothalamus, likely sequela of aforementioned  mass effect. 3. Hemorrhage layering in the occipital horns of the lateral ventricles, with redemonstrated effacement of the right lateral and third ventricle. No evidence of hydrocephalus. 4. Small amount of subdural hemorrhage along the right frontal lobe, measuring up to 4 mm. 5. Filling defect in the  distal right transverse sinus and proximal and mid right sigmoid sinus, consistent with thrombus, as seen on the same-day CTV.   LTM EEG 09/11/21 - Sharp waves,  right hemisphere, maximal right parieto-occipital region  - Continuous slow, generalized and lateralized right hemisphere   Routine EEG 02/17/2023 - Right frontotemporal slowing    ASSESSMENT AND PLAN  53 y.o. 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  Patient did have subsequent seizures and brain edema but no seizures since leaving the hospital in February 2023.  She has been anticoagulated initially on heparin and DOAC and now off anticoagulation and on Aspirin alone. She understands this is lifelong.   She is still complaining of severe headaches, she has failed both topiramate and amitriptyline, had hallucinations with Edmund Hilda, Nurtec not approved. Will give her Gabapentin nightly and 800 mg Ibuprofen since she reports control of the headaches with Ibuprofen.  For anxiety, continue Paxil, agree with patient having family member staying with her for some times. Continue with therapy  In terms of the seizures, she has not had any seizures since leaving the hospital in February 2023.  She still on Keppra 500 mg twice daily.  Her repeat routine EEG showed right hemispheric slowing. Due to the large size of the stroke, and abnormal EEG, will continue patient on Keppra but she does have side effect of irritability. We will switch her to Lamotrigine.  I will see her for follow up as scheduled in April.    1. Chronic tension-type headache, not intractable   2. Cerebral venous sinus thrombosis   3. Anxiety   4. Cerebral edema (HCC)   5. Seizure [facility])      Patient Instructions  Continue current medications Ibuprofen 800 mg as needed for severe headaches  Start Gabapentin 300 mg  nightly   Start Lamotrigine as below: Week 1: 25 mg (one pill) daily    Week 2: 50 mg (two pills) daily Week 3: 75 mg (three pills) daily Week 4: 100 mg (four pills) daily  Week 5: 125 mg (five pills) daily Week 6: 150 mg (six pills) daily  Follow up as scheduled in April      No orders of the defined types were placed in this encounter.   Meds ordered this encounter  Medications   ibuprofen (ADVIL) 800 MG tablet    Sig: Take 1 tablet (800 mg total) by mouth every 8 (eight) hours as needed for headache.    Dispense:  10 tablet    Refill:  0   gabapentin (NEURONTIN) 300 MG capsule    Sig: Take 1 capsule (300 mg total) by mouth at bedtime.    Dispense:  90 capsule    Refill:  0   cyclobenzaprine (FLEXERIL) 10 MG tablet    Sig: Take 1 tablet (10 mg total) by mouth 3 (three) times daily as needed for muscle spasms.    Dispense:  30 tablet    Refill:  3   LamoTRIgine (LAMICTAL XR) 25 MG TB24 24 hour tablet    Sig: Take 1 tablet (25 mg total) by mouth  daily for 7 days, THEN 2 tablets (50 mg total) daily for 7 days, THEN 3 tablets (75 mg total) daily for 7 days, THEN 4 tablets (100 mg total) daily for 7 days, THEN 5 tablets (125 mg total) daily for 7 days, THEN 6 tablets (150 mg total) daily for 7 days.    Dispense:  200 tablet    Refill:  0    Return in about 11 weeks (around 11/05/2023).    Windell Norfolk, MD 08/20/2023, 1:16 PM  [facility] [address], [address] [place], [place] [address] [phone]

## 2023-08-20 NOTE — Patient Instructions (Addendum)
Continue current medications Ibuprofen 800 mg as needed for severe headaches  Start Gabapentin 300 mg nightly   Start Lamotrigine as below: Week 1: 25 mg (one pill) daily    Week 2: 50 mg (two pills) daily Week 3: 75 mg (three pills) daily Week 4: 100 mg (four pills) daily  Week 5: 125 mg (five pills) daily Week 6: 150 mg (six pills) daily  Follow up as scheduled in April

## 2023-08-22 ENCOUNTER — Ambulatory Visit: Payer: 59 | Admitting: Neurology

## 2023-08-25 ENCOUNTER — Other Ambulatory Visit: Payer: Self-pay | Admitting: Neurology

## 2023-08-25 ENCOUNTER — Telehealth: Payer: Self-pay | Admitting: Neurology

## 2023-08-25 DIAGNOSIS — D5703 Hb-ss disease with cerebral vascular involvement: Secondary | ICD-10-CM

## 2023-08-25 NOTE — Telephone Encounter (Signed)
Pt came into office to let Dr. Teresa Coombs know Dr. Nolene Bernheim is who she is needing a referral to. Spoke about this in appt 08/20/2023.

## 2023-08-25 NOTE — Telephone Encounter (Signed)
 Done. Thanks.

## 2023-08-27 ENCOUNTER — Ambulatory Visit (INDEPENDENT_AMBULATORY_CARE_PROVIDER_SITE_OTHER): Payer: 59 | Admitting: Clinical

## 2023-08-27 ENCOUNTER — Encounter (HOSPITAL_COMMUNITY): Payer: Self-pay | Admitting: Clinical

## 2023-08-27 DIAGNOSIS — F0631 Mood disorder due to known physiological condition with depressive features: Secondary | ICD-10-CM | POA: Diagnosis not present

## 2023-08-27 DIAGNOSIS — F064 Anxiety disorder due to known physiological condition: Secondary | ICD-10-CM

## 2023-08-27 DIAGNOSIS — I619 Nontraumatic intracerebral hemorrhage, unspecified: Secondary | ICD-10-CM | POA: Diagnosis not present
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
## 2023-09-03 ENCOUNTER — Ambulatory Visit (INDEPENDENT_AMBULATORY_CARE_PROVIDER_SITE_OTHER): Payer: 59 | Admitting: Clinical

## 2023-09-03 ENCOUNTER — Encounter (HOSPITAL_COMMUNITY): Payer: Self-pay | Admitting: Clinical

## 2023-09-03 DIAGNOSIS — F064 Anxiety disorder due to known physiological condition: Secondary | ICD-10-CM

## 2023-09-03 DIAGNOSIS — I619 Nontraumatic intracerebral hemorrhage, unspecified: Secondary | ICD-10-CM

## 2023-09-03 DIAGNOSIS — F0631 Mood disorder due to known physiological condition with depressive features: Secondary | ICD-10-CM | POA: Diagnosis not present

## 2023-09-03 NOTE — Progress Notes (Signed)
THERAPIST PROGRESS NOTE  Session Time:   4:05pm-4:48m  Session #26  Participation Level: Active  Behavioral Response: Casual and Neat Alert Euthymic   Type of Therapy: Individual Therapy   Treatment Goals addressed:  Goal: LTG: Rupa will score less than 5 on the Generalized Anxiety Disorder 7 Scale (GAD-7) Goal: STG: Amir will practice problem solving skills 3 times per week for the next 4 weeks. Goal: STG: Hildur will reduce frequency of avoidant behaviors by 50% as evidenced by self-report in therapy sessions Goal: LTG: Swayze will be able to acknowledge and accept her new way of life with the brain injury 5 days out of 7 Goal: STG: Yen will practice how to handle anxiety-provoking situations where she is faced with sharing information about her brain injury and/or making decisions Goal: LTG: Reduce frequency, intensity, and duration of depression symptoms so that daily functioning is improved Goal: STG: Reduce overall depression score by a minimum of 25% on the Patient Health Questionnaire (PHQ-9) Goal: STG: Aurelia will practice behavioral activation skills 1-2 times per week for the next 26 weeks Goal: LTG: Nakeisha will be able to identify reasons her brain injury can cause depression symptoms and recognize when this is happening. Goal: STG: Merrill will be able to talk about her brain injury without becoming overwhelmed with negative emotion. Goal: STG: Chi will identify 3-5 behaviors that actually indicate social isolation Goal: LTG: Breahna will engage in at least 1 social activity outside of family per month Goal: STG: Yaiza will learn about boundaries and how to set them AEB her ability to successful set boundaries by self-report  ProgressTowards Goals: Progressing  Interventions: Supportive   Summary: April Reilly is a 53 y.o. female who presents with depression due to another medical condition, namely a brain injury that occurred with 2 strokes in  February 2023.  She presented oriented x5 and stated she was feeling "it's been a rough week."  CSW evaluated patient's medication compliance, use of coping tools, and self-care, as applicable.   The majority of the session was spent processing an exchange she had with an attorney she called as a possible representative for her, how rude he was to her, and such.  He blamed her for the medication given to her that was wrong.  This was processed at length, with a focus on her feelings, not on whether she was at fault in any way.  We also processed the trip to [place] last weekend that she had and how enjoyable it was.  She described what they did, including how she responded to a rude waitress in a theme restaurant where people are supposed to be rude.  She thought even over-the-top jokes were funny, so was given feedback about her positive thoughts.  Suicidal/Homicidal: No without intent/plan  Therapist Response:   Patient is progressing AEB engaging in scheduled therapy session.  Throughout the session, CSW gave patient the opportunity to explore thoughts and feelings associated with current life situations and past/present stressors.   CSW challenged patient gently and appropriately to consider different ways of looking at reported issues. CSW encouraged patient's expression of feelings and validated these using empathy, active listening, open body language, and unconditional positive regard.   PHQ-9 and GAD-7 need to be administered at next session.     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 Patient/Guardian was advised if they have not already done so to contact the registration department to sign all necessary forms in order for [place] to release information regarding their care.   Consent: Patient/Guardian gives verbal consent for treatment and assignment of benefits for services provided during this visit. Patient/Guardian expressed understanding and agreed to proceed.   [name], LCSW 09/06/2023

## 2023-09-10 ENCOUNTER — Ambulatory Visit (HOSPITAL_COMMUNITY): Payer: 59 | Admitting: Clinical

## 2023-09-10 ENCOUNTER — Encounter (HOSPITAL_COMMUNITY): Payer: Self-pay | Admitting: Clinical

## 2023-09-10 DIAGNOSIS — F0631 Mood disorder due to known physiological condition with depressive features: Secondary | ICD-10-CM | POA: Diagnosis not present

## 2023-09-10 DIAGNOSIS — Z87820 Personal history of traumatic brain injury: Secondary | ICD-10-CM

## 2023-09-10 DIAGNOSIS — F064 Anxiety disorder due to known physiological condition: Secondary | ICD-10-CM

## 2023-09-10 NOTE — Progress Notes (Unsigned)
THERAPIST PROGRESS NOTE  Session Time:   4:02pm-4:61m  Session #27  Virtual Visit via Video Note  I connected with April Reilly on 09/10/23 at  4:00 PM EST by a video enabled telemedicine application and verified that I am speaking with the correct person using two identifiers.  Location: Patient: Home Provider: Home office   I discussed the limitations of evaluation and management by telemedicine and the availability of in person appointments. The patient expressed understanding and agreed to proceed.   I discussed the assessment and treatment plan with the patient. The patient was provided an opportunity to ask questions and all were answered. The patient agreed with the plan and demonstrated an understanding of the instructions.   The patient was advised to call back or seek an in-person evaluation if the symptoms worsen or if the condition fails to improve as anticipated.  I provided 53 minutes of non-face-to-face time during this encounter.  [name], LCSW   Participation Level: Active  Behavioral Response: Casual Alert Euthymic   Type of Therapy: Individual Therapy   Treatment Goals addressed:  Goal: LTG: April Reilly will score less than 5 on the Generalized Anxiety Disorder 7 Scale (GAD-7) Goal: STG: April Reilly will practice problem solving skills 3 times per week for the next 4 weeks. Goal: STG: April Reilly will reduce frequency of avoidant behaviors by 50% as evidenced by self-report in therapy sessions Goal: LTG: April Reilly will be able to acknowledge and accept her new way of life with the brain injury 5 days out of 7 Goal: STG: April Reilly will practice how to handle anxiety-provoking situations where she is faced with sharing information about her brain injury and/or making decisions Goal: LTG: Reduce frequency, intensity, and duration of depression symptoms so that daily functioning is improved Goal: STG: Reduce overall depression score by a minimum of 25% on the  Patient Health Questionnaire (PHQ-9) Goal: STG: April Reilly will practice behavioral activation skills 1-2 times per week for the next 26 weeks Goal: LTG: April Reilly will be able to identify reasons her brain injury can cause depression symptoms and recognize when this is happening. Goal: STG: April Reilly will be able to talk about her brain injury without becoming overwhelmed with negative emotion. Goal: STG: April Reilly will identify 3-5 behaviors that actually indicate social isolation Goal: LTG: April Reilly will engage in at least 1 social activity outside of family per month Goal: STG: April Reilly will learn about boundaries and how to set them AEB her ability to successful set boundaries by self-report  ProgressTowards Goals: Progressing  Interventions: Supportive and Other: psychoeducation about the brain and sugar    Summary: April Reilly is a 53 y.o. 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  She presented oriented x5 and stated she was feeling "always tired."  CSW evaluated patient's medication compliance, use of coping tools, and self-care, as applicable.   She has been staying home and not going out much, in part because of the cold because of how it makes her sickle cell disease act up and be more painful.  She has been having a lot of aches and migraines.  We discussed that reading the news often is upsetting to her and even when she tries to get away from it, it still shows up on the news.  She also shared that she needs to know what is going on so that she can respond.  CSW spoke with her about modifying how  much and how often she looks at the news.  She shared that her boss from [place] and called her and tried to get her to return to live there and work there.  She divulged that she is still seeing the female friend whom she said during last session was not going to be in her life much longer because he really  made her angry.  Why she is keeping in her life was reviewed.   She shared that her neurologist asked her to write down everything she eats and drinks so they can start to determine if there is something in her diet making her pain worse.  CSW reviewed information from the book This is Your Brain on Food and emphasized the part about refined sugars, which she does use.   PHQ-9 and GAD-7 need to be administered at next session.  Suicidal/Homicidal: No without intent/plan  Therapist Response:   Patient is progressing AEB engaging in scheduled therapy session.  Throughout the session, CSW gave patient the opportunity to explore thoughts and feelings associated with current life situations and past/present stressors.   CSW challenged patient gently and appropriately to consider different ways of looking at reported issues. CSW encouraged patient's expression of feelings and validated these using empathy, active listening, open body language, and unconditional positive regard.   PHQ-9 and GAD-7 need to be administered at next session.     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 Patient/Guardian was advised if they have not already done so to contact the registration department to sign all necessary forms in order for [place] to release information regarding their care.   Consent: Patient/Guardian gives verbal consent for treatment and assignment of benefits for services provided during this  visit. Patient/Guardian expressed understanding and agreed to proceed.   [name], LCSW 09/10/2023

## 2023-09-17 ENCOUNTER — Ambulatory Visit (INDEPENDENT_AMBULATORY_CARE_PROVIDER_SITE_OTHER): Payer: 59 | Admitting: Clinical

## 2023-09-17 ENCOUNTER — Encounter (HOSPITAL_COMMUNITY): Payer: Self-pay | Admitting: Clinical

## 2023-09-17 DIAGNOSIS — F0631 Mood disorder due to known physiological condition with depressive features: Secondary | ICD-10-CM | POA: Diagnosis not present

## 2023-09-17 DIAGNOSIS — I619 Nontraumatic intracerebral hemorrhage, unspecified: Secondary | ICD-10-CM

## 2023-09-17 DIAGNOSIS — F064 Anxiety disorder due to known physiological condition: Secondary | ICD-10-CM | POA: Diagnosis not present

## 2023-09-17 NOTE — Progress Notes (Unsigned)
 THERAPIST PROGRESS NOTE  Session Time:   4:02pm-4:59pm  Session #28  Participation Level: Active  Behavioral Response: Casual Alert Euthymic   Type of Therapy: Individual Therapy   Treatment Goals addressed:  Goal: LTG: April Reilly will score less than 5 on the Generalized Anxiety Disorder 7 Scale (GAD-7) Goal: STG: April Reilly will practice problem solving skills 3 times per week for the next 4 weeks. Goal: STG: April Reilly will reduce frequency of avoidant behaviors by 50% as evidenced by self-report in therapy sessions Goal: LTG: April Reilly will be able to acknowledge and accept her new way of life with the brain injury 5 days out of 7 Goal: STG: April Reilly will practice how to handle anxiety-provoking situations where she is faced with sharing information about her brain injury and/or making decisions Goal: LTG: Reduce frequency, intensity, and duration of depression symptoms so that daily functioning is improved Goal: STG: Reduce overall depression score by a minimum of 25% on the Patient Health Questionnaire (PHQ-9) Goal: STG: April Reilly will practice behavioral activation skills 1-2 times per week for the next 26 weeks Goal: LTG: April Reilly will be able to identify reasons her brain injury can cause depression symptoms and recognize when this is happening. Goal: STG: April Reilly will be able to talk about her brain injury without becoming overwhelmed with negative emotion. Goal: STG: April Reilly will identify 3-5 behaviors that actually indicate social isolation Goal: LTG: April Reilly will engage in at least 1 social activity outside of family per month Goal: STG: April Reilly will learn about boundaries and how to set them AEB her ability to successful set boundaries by self-report  ProgressTowards Goals: Progressing  Interventions: Assertiveness Training and Supportive   Summary: April Reilly is a 53 y.o. female who presents with depression due to another medical condition, namely a brain injury that occurred with 2  strokes in February 2023.  She presented oriented x5 and stated she was feeling "sadness, melancholy."  CSW evaluated patient's medication compliance, use of coping tools, and self-care, as applicable.   She asked CSW to read some things she had written about having thoughts about what heaven is like and that she is ready to go there.  She stated she has had no suicidal thoughts, that these are just about her being ready when it happens.  She continues to have pain from her sickle cell disease and cannot get an appointment with the doctor.  Much of session was spent talking about her new female friend and the boundaries she is putting in the relationship because he is quite irritating to her by treating her like she is less than independent than she actually is.  He makes statements that leave her with a feeling of being judged and she does not like that, is not willing to put up with it.  He also does not follow through when he says he will do something, so she does not think the relationship will last long.  In the meantime, she is caught between him and his daughter who is gay but is scared to come out to him.  We talked about how she can remain neutral in the situation.  She also is trying to put together a support group for women her age along with 2 other women she knows have their share of social challenges.  She received kudos for this.    PHQ-9 and GAD-7 need to be administered at next session.  Suicidal/Homicidal: No without intent/plan  Therapist Response:   Patient is progressing AEB engaging in scheduled therapy  session.  Throughout the session, CSW gave patient the opportunity to explore thoughts and feelings associated with current life situations and past/present stressors.   CSW challenged patient gently and appropriately to consider different ways of looking at reported issues. CSW encouraged patient's expression of feelings and validated these using empathy, active listening, open body language,  and unconditional positive regard.   CSW encouraged patient to schedule more therapy sessions for the future, as needed.     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 Patient/Guardian was advised if they have not already done so to contact the registration department to sign all necessary forms in order for [place] to release information regarding their care.   Consent: Patient/Guardian gives verbal consent for treatment and assignment of benefits for services provided during this visit. Patient/Guardian expressed understanding and agreed to proceed.   [name], LCSW 09/17/2023

## 2023-09-18 ENCOUNTER — Telehealth: Payer: Self-pay | Admitting: *Deleted

## 2023-09-18 NOTE — Telephone Encounter (Signed)
 Reason for CRM: Patient is in need of a provider for the [facility] clinic referred by her neurologist. She is a sickle patient, the appt for new provider Phillips Grout was not available until April advised patient I would place on waiting list to possibly get a sooner appt.

## 2023-10-01 ENCOUNTER — Encounter (HOSPITAL_COMMUNITY): Payer: Self-pay | Admitting: Clinical

## 2023-10-01 ENCOUNTER — Ambulatory Visit (INDEPENDENT_AMBULATORY_CARE_PROVIDER_SITE_OTHER): Payer: 59 | Admitting: Clinical

## 2023-10-01 DIAGNOSIS — F0631 Mood disorder due to known physiological condition with depressive features: Secondary | ICD-10-CM | POA: Diagnosis not present

## 2023-10-01 DIAGNOSIS — F064 Anxiety disorder due to known physiological condition: Secondary | ICD-10-CM | POA: Diagnosis not present

## 2023-10-01 DIAGNOSIS — I619 Nontraumatic intracerebral hemorrhage, unspecified: Secondary | ICD-10-CM

## 2023-10-01 LAB — LAB REPORT - SCANNED
A1c: 5.3
EGFR: 96
Free T4: 1.27 ng/dL
TSH: 0.34 — AB (ref 0.41–5.90)

## 2023-10-01 NOTE — Progress Notes (Signed)
 THERAPIST PROGRESS NOTE  Session Time:   4:04pm-5:00pm  Session #29  Participation Level: Active  Behavioral Response: Casual Alert Angry and Depressed   Type of Therapy: Individual Therapy   Treatment Goals addressed:  Goal: LTG: Brandye will score less than 5 on the Generalized Anxiety Disorder 7 Scale (GAD-7) Goal: STG: Tatiana will practice problem solving skills 3 times per week for the next 4 weeks. Goal: STG: Aaleah will reduce frequency of avoidant behaviors by 50% as evidenced by self-report in therapy sessions Goal: LTG: Aayla will be able to acknowledge and accept her new way of life with the brain injury 5 days out of 7 Goal: STG: Catricia will practice how to handle anxiety-provoking situations where she is faced with sharing information about her brain injury and/or making decisions Goal: LTG: Reduce frequency, intensity, and duration of depression symptoms so that daily functioning is improved Goal: STG: Reduce overall depression score by a minimum of 25% on the Patient Health Questionnaire (PHQ-9) Goal: STG: Haydn will practice behavioral activation skills 1-2 times per week for the next 26 weeks Goal: LTG: Loucille will be able to identify reasons her brain injury can cause depression symptoms and recognize when this is happening. Goal: STG: Marbella will be able to talk about her brain injury without becoming overwhelmed with negative emotion. Goal: STG: Buelah will identify 3-5 behaviors that actually indicate social isolation Goal: LTG: Addis will engage in at least 1 social activity outside of family per month Goal: STG: Emeline will learn about boundaries and how to set them AEB her ability to successful set boundaries by self-report  ProgressTowards Goals: Progressing  Interventions: Supportive and Anger Management Training    Summary: April Reilly is a 53 y.o. 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  She presented oriented x5 and stated she was feeling "angry."  CSW evaluated patient's medication compliance, use of coping tools, and self-care, as applicable.  She provided an update on various aspects of her life that are normally discussed in therapy, including situations that have arisen with her "female friend" and with her daughter that have made her angry.  She wants her daughter out of her home, is tired of telling her the same things constantly, only for daughter not to follow through.  She also is angry with the man she has been dating and has declined all his phone calls and not answered his texts for a few days.  She wants to return to [place], feels that people there are friendlier and more genuine.  She has been paranoid recently, thinking someone who made her a slushie may have put something in it which made her sick and feeling that someone is watching her often.  She stated in writing in her journal (which she asked CSW to read) and verbally multiple times that she needs "a break from the world."  She has an ongoing belief that she takes care of others and not herself.  She had disappointing news from a local doctor and looks forward to going to see her Primary Care Physician in [place] in April 2025.  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  CSW explored with her whether she would benefit from staying for a prolonged visit in [place] to see if that would really be what she would  want to do, moving away from daughters and grandchildren and returning to her home city.  She was in agreement that this might be a good idea.   PHQ-9 and GAD-7 need to be administered at next session.  Suicidal/Homicidal: No without intent/plan  Therapist Response:   Patient is progressing AEB engaging in scheduled therapy session.  Throughout the session, CSW gave patient the opportunity  to explore thoughts and feelings associated with current life situations and past/present stressors.   CSW challenged patient gently and appropriately to consider different ways of looking at reported issues. CSW encouraged patient's expression of feelings and validated these using empathy, active listening, open body language, and unconditional positive regard.   CSW encouraged patient to schedule more therapy sessions for the future, as needed.     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 Patient/Guardian was advised if they have not already done so to contact the registration department to sign all necessary forms in order for [place] to release information regarding their care.   Consent: Patient/Guardian gives verbal consent for treatment and assignment of benefits for services provided during this visit. Patient/Guardian expressed understanding and agreed to proceed.   [name], LCSW 10/01/2023     09/05/2022    5:36 PM 11/14/2021   10:19 AM  Depression screen PHQ 2/9  Decreased Interest 1 2  Down, Depressed, Hopeless 2 0  PHQ - 2 Score 3 2  Altered sleeping 3 2  Tired, decreased energy 0 2  Change in appetite 0 3  Feeling bad or failure about yourself  3 0  Trouble concentrating 0 0  Moving slowly or fidgety/restless 0 2  Suicidal thoughts 0 0  PHQ-9 Score 9 11  Difficult doing  work/chores Somewhat difficult Somewhat difficult       09/05/2022    5:37 PM  GAD 7 : Generalized Anxiety Score  Nervous, Anxious, on Edge 1  Control/stop worrying 1  Worry too much - different things 1  Trouble relaxing 1  Restless 0  Easily annoyed or irritable 1  Afraid - awful might happen 1  Total GAD 7 Score 6  Anxiety Difficulty Somewhat difficult

## 2023-10-15 ENCOUNTER — Ambulatory Visit (HOSPITAL_COMMUNITY): Payer: 59 | Admitting: Clinical

## 2023-10-16 ENCOUNTER — Ambulatory Visit (INDEPENDENT_AMBULATORY_CARE_PROVIDER_SITE_OTHER): Admitting: Clinical

## 2023-10-16 ENCOUNTER — Encounter (HOSPITAL_COMMUNITY): Payer: Self-pay | Admitting: Clinical

## 2023-10-16 DIAGNOSIS — F0631 Mood disorder due to known physiological condition with depressive features: Secondary | ICD-10-CM | POA: Diagnosis not present

## 2023-10-16 DIAGNOSIS — F064 Anxiety disorder due to known physiological condition: Secondary | ICD-10-CM | POA: Diagnosis not present

## 2023-10-16 DIAGNOSIS — Z87820 Personal history of traumatic brain injury: Secondary | ICD-10-CM | POA: Diagnosis not present

## 2023-10-16 NOTE — Progress Notes (Addendum)
 THERAPIST PROGRESS NOTE  Session Time:   1:03pm-2:00pm  Session #30  Participation Level: Active  Behavioral Response: Casual Alert Euthymic   Type of Therapy: Individual Therapy   Treatment Goals addressed:  New treatment goals established: STG: April Reilly will practice problem solving skills 3 times per week for the next 4 weeks. STG: April Reilly will reduce frequency of avoidant behaviors by 50% as evidenced by self-report in therapy sessions LTG: April Reilly will be able to acknowledge and accept her new way of life with the brain injury 5 days out of 7 STG: April Reilly will practice how to handle anxiety-provoking situations where she is faced with sharing information about her brain injury and/or making decisions LTG: Reduce frequency, intensity, and duration of depression symptoms so that daily functioning is improved LTG: April Reilly will be able to identify reasons her brain injury can cause depression symptoms and recognize when this is happening. STG: April Reilly will be able to talk about her brain injury without becoming overwhelmed with negative emotion. LTG: April Reilly will engage in at least 1 social activity outside of family per month STG: April Reilly will learn about boundaries and how to set them AEB her ability to successful set boundaries by self-report STG: Score less than 9 on the PHQ-9 and less than 5 on the GAD-7 as evidenced by intermittent administration of the questionnaires to determine progress in managing depression and anxiety.  LTG: Learn and practice communication techniques such as active listening, "I" statements, open-ended questions, fair fighting rules, initiating conversations;  learn about boundary types and how to implement/enforce them AEB self-report of use of same.  STG: Learn breathing techniques and grounding techniques at an age-appropriate and ability-appropriate level and demonstrate mastery in session then report independent use of these skills out of session.    STG:  Process life events to the extent needed so that will be able to move forward with various areas of life in a better frame of mind per self-report.    ProgressTowards Goals: Progressing  Interventions: Supportive, Anger Management Training, and Other: assessment     Summary: April Reilly is a 53 y.o. 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  She presented oriented x5 and stated she was feeling "better after having some time alone."  CSW evaluated patient's medication compliance, use of coping tools, and self-care, as applicable.  She provided an update on various aspects of her life that are normally discussed in therapy, including her brain issues and symptoms, her family relationships, and ongoing grief issues.  She went to [place] for the past 4 days for some "me" time and enjoyed it a lot.  She went out to eat alone, could sleep whenever she wanted, and saw an old friend.  The family called her for a little at first, but then left her alone.  Now that they are expecting her home today, the phone calls and text messages have started again.  We examined her guilt feelings when she uses her boundaries to say "no."   She shared that she is still paranoid that someone is watching her, always needs to watch her surroundings, actually brought with her a gun, a taser, mace, and an ice pick in case she is attacked.  She feels good that she is not talking to "Tim" anymore and feels like a "crybaby" since she continues to cry over her boyfriend's death.  CSW processed the reasoning behind this belief with her  so that we could replace faulty reasoning with logic and thus perhaps come up with healthier things for her to say to herself instead of just labeling herself.  She asked if CSW thinks her brain has "shifted back" to which CSW deferred to neurologist.  CSW did tell her about types of exercises that cross both the  left and right brain sides and perhaps help with incorporating them back together again.  Despite the fact that she has been told appointments are running out, she does not understand why we don't just reschedule her for future appointments without consulting with her.  CSW found some appointments for her and asked her if those dates would work.  PHQ-9 and GAD-7 were administered and the improved scores were explained to her  Suicidal/Homicidal: No without intent/plan  Therapist Response:   Patient is progressing AEB engaging in scheduled therapy session.  Throughout the session, CSW gave patient the opportunity to explore thoughts and feelings associated with current life situations and past/present stressors.   CSW challenged patient gently and appropriately to consider different ways of looking at reported issues. CSW encouraged patient's expression of feelings and validated these using empathy, active listening, open body language, and unconditional positive regard.   CSW encouraged patient to schedule more therapy sessions for the future, as needed.  She stated they are always just scheduled for her , so CSW scheduled some for her, all in the afternoon.     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 Patient/Guardian was  advised if they have not already done so to contact the registration department to sign all necessary forms in order for [place] to release information regarding their care.   Consent: Patient/Guardian gives verbal consent for treatment and assignment of benefits for services provided during this visit. Patient/Guardian expressed understanding and agreed to proceed.      10/16/2023    2:02 PM 09/05/2022    5:36 PM 11/14/2021   10:19 AM  Depression screen PHQ 2/9  Decreased Interest 1 1 2   Down, Depressed, Hopeless 1 2 0  PHQ - 2 Score 2 3 2   Altered sleeping 1 3 2   Tired, decreased energy 2 0 2  Change in appetite 0 0 3  Feeling bad or failure about yourself  3 3 0  Trouble concentrating 3 0 0  Moving slowly or fidgety/restless 2 0 2  Suicidal thoughts 1 0 0  PHQ-9 Score 14 9 11   Difficult doing work/chores  Somewhat difficult Somewhat difficult      10/16/2023    2:01 PM 09/05/2022    5:37 PM  GAD 7 : Generalized Anxiety Score  Nervous, Anxious, on Edge 1 1  Control/stop worrying 1 1  Worry too much - different things 3 1  Trouble relaxing 0 1  Restless 0 0  Easily annoyed or irritable 3 1  Afraid - awful might happen 1 1  Total GAD 7 Score 9 6  Anxiety Difficulty Somewhat difficult Somewhat difficult       [name], LCSW 10/16/2023

## 2023-10-21 ENCOUNTER — Other Ambulatory Visit: Payer: Self-pay | Admitting: Neurology

## 2023-10-21 ENCOUNTER — Telehealth: Payer: Self-pay | Admitting: Neurology

## 2023-10-21 NOTE — Telephone Encounter (Signed)
 Pt is requesting a refill for LamoTRIgine 200 MG TB24 24 hour tablet,  ibuprofen (ADVIL) 800 MG tablet .  Pharmacy: [phone]

## 2023-10-22 NOTE — Telephone Encounter (Signed)
 The lamotrigine should be at the pharmacy with 11 refills.

## 2023-11-03 ENCOUNTER — Encounter: Payer: Self-pay | Admitting: Nurse Practitioner

## 2023-11-03 ENCOUNTER — Ambulatory Visit (INDEPENDENT_AMBULATORY_CARE_PROVIDER_SITE_OTHER): Admitting: Clinical

## 2023-11-03 ENCOUNTER — Ambulatory Visit (INDEPENDENT_AMBULATORY_CARE_PROVIDER_SITE_OTHER): Payer: Self-pay | Admitting: Nurse Practitioner

## 2023-11-03 ENCOUNTER — Encounter (HOSPITAL_COMMUNITY): Payer: Self-pay | Admitting: Clinical

## 2023-11-03 VITALS — BP 110/73 | HR 110 | Temp 97.0°F | Wt 177.0 lb

## 2023-11-03 DIAGNOSIS — F064 Anxiety disorder due to known physiological condition: Secondary | ICD-10-CM | POA: Diagnosis not present

## 2023-11-03 DIAGNOSIS — M549 Dorsalgia, unspecified: Secondary | ICD-10-CM | POA: Insufficient documentation

## 2023-11-03 DIAGNOSIS — F0631 Mood disorder due to known physiological condition with depressive features: Secondary | ICD-10-CM

## 2023-11-03 DIAGNOSIS — Z1231 Encounter for screening mammogram for malignant neoplasm of breast: Secondary | ICD-10-CM

## 2023-11-03 DIAGNOSIS — G8929 Other chronic pain: Secondary | ICD-10-CM

## 2023-11-03 DIAGNOSIS — R569 Unspecified convulsions: Secondary | ICD-10-CM

## 2023-11-03 DIAGNOSIS — G44229 Chronic tension-type headache, not intractable: Secondary | ICD-10-CM

## 2023-11-03 DIAGNOSIS — N938 Other specified abnormal uterine and vaginal bleeding: Secondary | ICD-10-CM

## 2023-11-03 DIAGNOSIS — G08 Intracranial and intraspinal phlebitis and thrombophlebitis: Secondary | ICD-10-CM

## 2023-11-03 DIAGNOSIS — D509 Iron deficiency anemia, unspecified: Secondary | ICD-10-CM | POA: Diagnosis not present

## 2023-11-03 DIAGNOSIS — F32A Depression, unspecified: Secondary | ICD-10-CM | POA: Insufficient documentation

## 2023-11-03 DIAGNOSIS — M542 Cervicalgia: Secondary | ICD-10-CM | POA: Insufficient documentation

## 2023-11-03 DIAGNOSIS — M25512 Pain in left shoulder: Secondary | ICD-10-CM

## 2023-11-03 DIAGNOSIS — F419 Anxiety disorder, unspecified: Secondary | ICD-10-CM | POA: Insufficient documentation

## 2023-11-03 MED ORDER — PAROXETINE HCL 10 MG PO TABS: 10.0000 mg | ORAL_TABLET | Freq: Every day | ORAL | 3 refills | Status: DC

## 2023-11-03 MED ORDER — GABAPENTIN 300 MG PO CAPS: 300.0000 mg | ORAL_CAPSULE | Freq: Every day | ORAL | 0 refills | Status: DC

## 2023-11-03 NOTE — Patient Instructions (Addendum)
 For anxiety and depression please start taking Paxil 10 mg daily as ordered  For your chronic pain it is okay to increase gabapentin to 300 mg twice daily, continue ibuprofen 800 mg 3 times daily as needed.  Can also take Tylenol 650 mg every 6 hours as needed.  Take Flexeril 10 mg 3 times daily as needed for muscle spasm       It is important that you exercise regularly at least 30 minutes 5 times a week as tolerated  Think about what you will eat, plan ahead. Choose " clean, green, fresh or frozen" over canned, processed or packaged foods which are more sugary, salty and fatty. 70 to 75% of food eaten should be vegetables and fruit. Three meals at set times with snacks allowed between meals, but they must be fruit or vegetables. Aim to eat over a 12 hour period , example 7 am to 7 pm, and STOP after  your last meal of the day. Drink water,generally about 64 ounces per day, no other drink is as healthy. Fruit juice is best enjoyed in a healthy way, by EATING the fruit.  Thanks for choosing [facility] we consider it a privelige to serve you.

## 2023-11-03 NOTE — Assessment & Plan Note (Signed)
 Continue aspirin 81mg  daily.

## 2023-11-03 NOTE — Progress Notes (Signed)
 THERAPIST PROGRESS NOTE  Session Time:   3:02pm-4:00pm  Session #31  Participation Level: Active  Behavioral Response: Casual Alert Negative   Type of Therapy: Individual Therapy   Treatment Goals addressed:  STG: April Reilly will practice problem solving skills 3 times per week for the next 4 weeks. STG: April Reilly will reduce frequency of avoidant behaviors by 50% as evidenced by self-report in therapy sessions LTG: April Reilly will be able to acknowledge and accept her new way of life with the brain injury 5 days out of 7 STG: April Reilly will practice how to handle anxiety-provoking situations where she is faced with sharing information about her brain injury and/or making decisions LTG: Reduce frequency, intensity, and duration of depression symptoms so that daily functioning is improved LTG: April Reilly will be able to identify reasons her brain injury can cause depression symptoms and recognize when this is happening. STG: April Reilly will be able to talk about her brain injury without becoming overwhelmed with negative emotion. LTG: April Reilly will engage in at least 1 social activity outside of family per month STG: April Reilly will learn about boundaries and how to set them AEB her ability to successful set boundaries by self-report STG: Score less than 9 on the PHQ-9 and less than 5 on the GAD-7 as evidenced by intermittent administration of the questionnaires to determine progress in managing depression and anxiety.  LTG: Learn and practice communication techniques such as active listening, "I" statements, open-ended questions, fair fighting rules, initiating conversations;  learn about boundary types and how to implement/enforce them AEB self-report of use of same.  STG: Learn breathing techniques and grounding techniques at an age-appropriate and ability-appropriate level and demonstrate mastery in session then report independent use of these skills out of session.    STG: Process life events to the extent  needed so that will be able to move forward with various areas of life in a better frame of mind per self-report.    ProgressTowards Goals: Progressing  Interventions: Psychosocial Skills: sleep psychoeducation, discussion re routine and Supportive    Summary: April Reilly is a 53 y.o. 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  She presented oriented x5 and stated she was feeling "hanging in there, but I've been very moody."  CSW evaluated patient's medication compliance, use of coping tools, and self-care, as applicable.  She provided an update on various aspects of her life that are normally discussed in therapy, including irritations with grandkids always asking for money, recent visit with sickle cell clinic, and disruptions in sleep routines recently.  We discussed more about boundaries, CSW pointing out that boundaries are not just telling someone how far they can go, but also telling them what consequences they will face if they cross the boundary, and then implementing the consequence.  She stated she did not fall asleep until 6:20am this morning, and CSW took her through sleep hygiene/sleep psychoeducation again.  She stated she stays awake because she is thinking too much, then later states she stays awake because time passes while she is binge-watching TV.  There are times she will stay in bed for 2 straight days.  CSW pointed out that many people who are psychiatrically hospitalized report that the major benefit of having been in the hospital is the return to a normal schedule.  We discussed how she could go about doing this, although she was a reluctant participant in that conversation.  She is  going to be starting swim lessons on Wednesdays and will be going to movies on $6 Tuesdays, so CSW encouraged her to build from there.  She talked about other issues going on in her life, received encouragement  and support.  Suicidal/Homicidal: No without intent/plan  Therapist Response:   Patient is progressing AEB engaging in scheduled therapy session.  Throughout the session, CSW gave patient the opportunity to explore thoughts and feelings associated with current life situations and past/present stressors.   CSW challenged patient gently and appropriately to consider different ways of looking at reported issues. CSW encouraged patient's expression of feelings and validated these using empathy, active listening, open body language, and unconditional positive regard.        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 Patient/Guardian was advised if they have not already done so to contact the registration department to sign all necessary forms in order for us  to release information regarding their care.   Consent: Patient/Guardian gives verbal consent for treatment and assignment of benefits for services provided during this visit. Patient/Guardian expressed understanding and agreed to proceed.         Ancel Kass, LCSW 11/03/2023

## 2023-11-03 NOTE — Assessment & Plan Note (Signed)
 Currently denies uterine bleeding for over a year now

## 2023-11-03 NOTE — Assessment & Plan Note (Signed)
 Has history of spine pain, disc contributing to her neck pain Continue gabapentin and ibuprofen Patient referred to orthopedics   Imaging Results - X-Ray Thoracic Spine 2 Views (01/01/2023 12:58 PM EDT) Narrative  01/03/2023 9:26 AM EDT  This result has an attachment that is not available.   Facility: [facility]  Exam: Thoracic spine x-rays. 2 views: Frontal and lateral projections  History: Thoracic spine pain.  Findings: There is mild wedge compression of mid to lower thoracic vertebral bodies best demonstrated on the frontal projection. Acute fractures are not demonstrated.  The disc spaces are not narrowed. Prominent dextrorotoscoliosis is associated with the mid to lower thoracic spine.  Impression: Thoracic spine x-rays demonstrates prominent dextrorotoscoliosis of the mid to lower thoracic spine.  Electronically signed by:  Augustina Block MD  01/03/2023 09:26 AM EDT RP Workstation: [id number]

## 2023-11-03 NOTE — Assessment & Plan Note (Addendum)
 Chronic condition, previous x-ray was normal Repeat x-ray of the shoulder Can increase gabapentin to 300 mg twice daily Continue ibuprofen 800 mg every 8 hours as needed Patient referred to orthopedics  CLINICAL DATA:  Left shoulder pain.   EXAM: LEFT SHOULDER - 2+ VIEWS, 4 images   COMPARISON:  None.   FINDINGS: There is no evidence of fracture or dislocation. There is no evidence of arthropathy or other focal bone abnormality. Soft tissues are unremarkable.   IMPRESSION: Negative.

## 2023-11-03 NOTE — Assessment & Plan Note (Signed)
 Sickle cell disease - Continue Hydrea 1000 mg daily. Folic acid 1 mg daily to prevent aplastic bone marrow crises.  Will check sickle cell panel for absolute neutrophil count and platelets. Will also check reticulocyte count.  We discussed the need for good hydration, monitoring of hydration status, avoidance of heat, cold, stress, and infection triggers. We discussed the risks and benefits of Hydrea, including bone marrow suppression, the possibility of GI upset, skin ulcers, hair thinning, and teratogenicity. Discussed the needed to use contraception while on hydroxyurea due to teratogenicity. The patient was reminded of the need to seek medical attention of any symptoms of bleeding, anemia, or infection.  Pulmonary evaluation - Patient denies severe recurrent wheezes, shortness of breath with exercise, or persistent cough. If these symptoms develop, pulmonary function tests with spirometry will be ordered, and if abnormal, plan on referral to Pulmonology for further evaluation.  Pulmonary-  Cardiac-  Eye - High risk of proliferative retinopathy. Annual eye exam with retinal exam recommended to patient.  Immunization status - Patient will receive influenza vaccination on today  Acute and chronic painful episodes -  Pt states that she primarily utilizes Ibuprofen for mild pain related to sickle cell anemia.  We discussed that pt is to receive her Schedule II prescriptions only from us . Pt is also aware that the prescription history is available to us  online through the [facility] CSRS. We reminded the patient that all patients receiving Schedule II narcotics must be seen for follow within the 3 months in the office.  We reviewed the terms of our pain agreement, including the need to keep medicines in a safe locked location away from children or pets, and the need to report excess sedation or constipation, measures to avoid constipation, and policies related to early refills and stolen prescriptions. According  to the [place] [facility] program, we have reviewed details related to analgesia, adverse effects, aberrant behaviors. Reviewed [place] Substance Reporting system prior to prescribing opiate medication, no inconsistencies noted.    Iron overload from chronic transfusion.  Will check ferritin levels. She has received blood transfusions in the past.

## 2023-11-03 NOTE — Assessment & Plan Note (Signed)
 Continue ibuprofen 800 mg 3 times daily as needed, gabapentin 300 mg at bedtime Encouraged to maintain close follow-up with neurology

## 2023-11-03 NOTE — Assessment & Plan Note (Signed)
 Flowsheet Row Office Visit from 11/03/2023 in [facility] - [facility] Dept Of [facility]  PHQ-9 Total Score 19          11/03/2023    1:46 PM 10/16/2023    2:01 PM 09/05/2022    5:37 PM  GAD 7 : Generalized Anxiety Score  Nervous, Anxious, on Edge 2 1 1   Control/stop worrying 3 1 1   Worry too much - different things 3 3 1   Trouble relaxing 3 0 1  Restless 3 0 0  Easily annoyed or irritable 3 3 1   Afraid - awful might happen 2 1 1   Total GAD 7 Score 19 9 6   Anxiety Difficulty Somewhat difficult Somewhat difficult Somewhat difficult  Patient encouraged to take Paxil 10 mg daily instead of as needed Continue counseling Follow-up in 6 weeks

## 2023-11-03 NOTE — Progress Notes (Signed)
 New Patient Office Visit  Subjective:  Patient ID: April Reilly, female    DOB: March 19, 1971  Age: 53 y.o. MRN: [id number]  CC:  Chief Complaint  Patient presents with   Establish Care    HPI April Reilly is a 53 y.o. female  has a past medical history of Asthma, Stroke (HCC), and Vaginal bleeding, abnormal.  Patient presents to establish care for her chronic medical conditions.  Previous PCP was in [place], she had also established care with another medical practitioner in this area but she is transferring her care to this office.   History of cerebral venous thrombosis She was on Eliquis for anticoagulation. But she lost insurance and couldn't afford Eliquis. So she was started on aspirin which she has been taking daily.  Currently denies abnormal bleeding.  Chronic Tension headaches.  She had failed Topamax and amitriptyline and Qulipta.  Currently on gabapentin at nights and ibuprofen 800 mg as needed.  She is followed by neurologist  Seizure.  She denies any recent seizures, was on Keppra but did had side effect of irritability with the medication and was switched to lamotrigine 200 mg daily   Sickle cell disease?  It appears that the patient has sickle cell trait and not sickle cell disease.  Stated that she was seeing hematologist in [place] and was on Vicodin for sickle cell pain.  Has chronic left shoulder pain neck pain, pain in her spine.  Her shoulder pain is currently rated 10/10.  No fever, chills, chest pain, shortness of breath, abdominal pain, nausea, vomiting.  Anxiety and depression.  She sees a therapist has a prescription for Paxil that she takes as needed instead of daily.  She attributes her anxiety and depression to not being able to do what she used to be able to do, not able to work because of her chronic medical conditions.  She denies SI, HI      Past Medical History:  Diagnosis Date   Asthma    Stroke (HCC)    Vaginal bleeding, abnormal    for 4  months    Past Surgical History:  Procedure Laterality Date   FOOT SURGERY     TUBAL LIGATION      Family History  Problem Relation Age of Onset   Clotting disorder Mother     Social History   Socioeconomic History   Marital status: Single    Spouse name: Not on file   Number of children: 4   Years of education: Not on file   Highest education level: Not on file  Occupational History   Not on file  Tobacco Use   Smoking status: Never   Smokeless tobacco: Never  Substance and Sexual Activity   Alcohol use: Not Currently   Drug use: Never   Sexual activity: Not Currently  Other Topics Concern   Not on file  Social History Narrative      Right handed   Caffeine ocassional   Lives alone with dog   Social Drivers of Health   Financial Resource Strain: Not on file  Food Insecurity: No Food Insecurity (01/01/2023)   Received from [facility] [employer] Insecurity    Within the past 12 months, you worried that your food would run out before you got the money to buy more.: Never true    Within the past 12 months, the food you bought just didn't last and you didn't have money to get more.: Never true  Transportation  Needs: No Transportation Needs (01/01/2023)   [facility] - Transportation    Lack of Transportation (Medical): No    Lack of Transportation (Non-Medical): No  Physical Activity: Not on file  Stress: Low Risk  (02/27/2020)   Received from [facility]   Stress    Stress Risk: Not on file    General Anxiety Disorder (GAD-7) Score: Not on file  Social Connections: Not on file  Intimate Partner Violence: Not At Risk (01/01/2023)   Received from [facility]   Intimate Partner Violence    In the past 12 months, has the patient been fearful of being subjected to or has experienced physical/emotional abuse from an intimate partner, other family member , friend or domestic partner?: No    ROS Review of Systems   Constitutional:  Negative for appetite change, chills, fatigue and fever.  HENT:  Negative for congestion, postnasal drip, rhinorrhea and sneezing.   Respiratory:  Negative for cough, shortness of breath and wheezing.   Cardiovascular:  Negative for chest pain, palpitations and leg swelling.  Gastrointestinal:  Negative for abdominal pain, constipation, nausea and vomiting.  Genitourinary:  Negative for difficulty urinating, dysuria, flank pain and frequency.  Musculoskeletal:  Positive for arthralgias. Negative for joint swelling and myalgias.  Skin:  Negative for color change, pallor, rash and wound.  Neurological:  Positive for headaches. Negative for dizziness, facial asymmetry, weakness and numbness.  Psychiatric/Behavioral:  Positive for sleep disturbance. Negative for behavioral problems, confusion, self-injury and suicidal ideas. The patient is nervous/anxious.     Objective:   Today's Vitals: BP 110/73   Pulse (!) 110   Temp (!) 97 F (36.1 C)   Wt 177 lb (80.3 kg)   SpO2 96%   BMI 34.57 kg/m   Physical Exam Vitals and nursing note reviewed.  Constitutional:      General: She is not in acute distress.    Appearance: Normal appearance. She is obese. She is not ill-appearing, toxic-appearing or diaphoretic.  HENT:     Mouth/Throat:     Mouth: Mucous membranes are moist.     Pharynx: Oropharynx is clear. No oropharyngeal exudate or posterior oropharyngeal erythema.  Eyes:     General: No scleral icterus.       Right eye: No discharge.        Left eye: No discharge.     Extraocular Movements: Extraocular movements intact.     Conjunctiva/sclera: Conjunctivae normal.  Cardiovascular:     Rate and Rhythm: Normal rate and regular rhythm.     Pulses: Normal pulses.     Heart sounds: Normal heart sounds. No murmur heard.    No friction rub. No gallop.  Pulmonary:     Effort: Pulmonary effort is normal. No respiratory distress.     Breath sounds: Normal breath sounds. No  stridor. No wheezing, rhonchi or rales.  Chest:     Chest wall: No tenderness.  Abdominal:     General: There is no distension.     Palpations: Abdomen is soft.     Tenderness: There is no abdominal tenderness. There is no right CVA tenderness, left CVA tenderness or guarding.  Musculoskeletal:        General: Tenderness present. No swelling, deformity or signs of injury.     Right lower leg: No edema.     Left lower leg: No edema.     Comments: Tenderness on range of motion of left shoulder, skin warm and  dry has palpable radial pulses  Skin:    General: Skin is warm and dry.     Capillary Refill: Capillary refill takes less than 2 seconds.     Coloration: Skin is not jaundiced or pale.     Findings: No bruising, erythema or lesion.  Neurological:     Mental Status: She is alert and oriented to person, place, and time.     Motor: No weakness.     Gait: Gait normal.  Psychiatric:        Mood and Affect: Mood normal.        Behavior: Behavior normal.        Thought Content: Thought content normal.        Judgment: Judgment normal.     Assessment & Plan:   Problem List Items Addressed This Visit       Cardiovascular and Mediastinum   Cerebral venous sinus thrombosis   Continue aspirin 81 mg daily        Genitourinary   Dysfunctional uterine bleeding   Currently denies uterine bleeding for over a year now        Other   Seizure (HCC)   Continue lamotrigine 200 mg daily Encouraged to maintain close follow-up with neurology      Relevant Medications   gabapentin (NEURONTIN) 300 MG capsule   Iron deficiency anemia - Primary   Will screen patient for sickle cell disease - Hgb Fractionation Cascade   Component Ref Range & Units 10 mo ago  Hemoglobin A Percent >96.0 % 61.1 Low   Hemoglobin A2 Percent 2.0 - 3.2 % 2.9  Hemoglobin F Percent <2.0 % <1.0  Hemoglobin S Percent % 36.0 High   RBC SO 3.80 - 5.10 4.97  Hgb SO 11.7 - 15.5 g/dl 91.4  Hemoglobin  ElectropHoresis Interpretation SEE COMMENT  Comment: Consistent with hemoglobin A/S (sickle trait) Lab test performed by: Lab Mnemonic: [id number] [address] [place], [place] [address] Alric Ran  Hct SO 35.0 - 45.0 % 42.6  MCV SO 80.0 - 100.0 fL 85.7  MCH SO 27.0 - 33.0 pg 27.4  RDW SO 11.0 - 15.0 % 12.7   Recent CBC was normal with CBC 13.3 HCT 40.7       Relevant Orders   Hgb Fractionation Cascade   Anxiety and depression   Flowsheet Row Office Visit from 11/03/2023 in [facility] - A Dept Of [facility] [address] [facility]  PHQ-9 Total Score 19          11/03/2023    1:46 PM 10/16/2023    2:01 PM 09/05/2022    5:37 PM  GAD 7 : Generalized Anxiety Score  Nervous, Anxious, on Edge 2 1 1   Control/stop worrying 3 1 1   Worry too much - different things 3 3 1   Trouble relaxing 3 0 1  Restless 3 0 0  Easily annoyed or irritable 3 3 1   Afraid - awful might happen 2 1 1   Total GAD 7 Score 19 9 6   Anxiety Difficulty Somewhat difficult Somewhat difficult Somewhat difficult  Patient encouraged to take Paxil 10 mg daily instead of as needed Continue counseling Follow-up in 6 weeks        Relevant Medications   PARoxetine (PAXIL) 10 MG tablet   Chronic tension-type headache, not intractable   Continue ibuprofen 800 mg 3 times daily as needed, gabapentin 300 mg at bedtime Encouraged to maintain close follow-up with neurology  Relevant Medications   PARoxetine (PAXIL) 10 MG tablet   gabapentin (NEURONTIN) 300 MG capsule   Chronic left shoulder pain   Chronic condition, previous x-ray was normal Repeat x-ray of the shoulder Can increase gabapentin to 300 mg twice daily Continue ibuprofen 800 mg every 8 hours as needed Patient referred to orthopedics  CLINICAL DATA:  Left shoulder pain.   EXAM: LEFT SHOULDER - 2+ VIEWS, 4 images   COMPARISON:  None.   FINDINGS: There is no evidence of fracture or dislocation. There  is no evidence of arthropathy or other focal bone abnormality. Soft tissues are unremarkable.   IMPRESSION: Negative.      Relevant Medications   PARoxetine (PAXIL) 10 MG tablet   gabapentin (NEURONTIN) 300 MG capsule   Other Relevant Orders   DG Shoulder Left   Ambulatory referral to Orthopedic Surgery   Neck pain   Has history of spine pain, disc contributing to her neck pain Continue gabapentin and ibuprofen Patient referred to orthopedics   Imaging Results - X-Ray Thoracic Spine 2 Views (01/01/2023 12:58 PM EDT) Narrative  01/03/2023 9:26 AM EDT  This result has an attachment that is not available.   Facility: [facility]  Exam: Thoracic spine x-rays. 2 views: Frontal and lateral projections  History: Thoracic spine pain.  Findings: There is mild wedge compression of mid to lower thoracic vertebral bodies best demonstrated on the frontal projection. Acute fractures are not demonstrated.  The disc spaces are not narrowed. Prominent dextrorotoscoliosis is associated with the mid to lower thoracic spine.  Impression: Thoracic spine x-rays demonstrates prominent dextrorotoscoliosis of the mid to lower thoracic spine.  Electronically signed by:  Augustina Block MD  01/03/2023 09:26 AM EDT RP Workstation: [id number]          Relevant Medications   gabapentin (NEURONTIN) 300 MG capsule   Other Relevant Orders   Ambulatory referral to Orthopedic Surgery   Other Visit Diagnoses       Screening mammogram for breast cancer       Relevant Orders   MM 3D SCREENING MAMMOGRAM BILATERAL BREAST       Outpatient Encounter Medications as of 11/03/2023  Medication Sig   aspirin EC 81 MG tablet Take 81 mg by mouth daily. Swallow whole.   ibuprofen (ADVIL) 800 MG tablet TAKE 1 TABLET (800 MG TOTAL) BY MOUTH EVERY 8 (EIGHT) HOURS AS NEEDED FOR HEADACHE.   LamoTRIgine 200 MG TB24 24 hour tablet Take 1 tablet (200 mg total) by mouth daily.   [DISCONTINUED]  gabapentin (NEURONTIN) 300 MG capsule Take 1 capsule (300 mg total) by mouth at bedtime.   [DISCONTINUED] PARoxetine (PAXIL) 10 MG tablet Take 1 tablet (10 mg total) by mouth daily.   cyclobenzaprine (FLEXERIL) 10 MG tablet Take 1 tablet (10 mg total) by mouth 3 (three) times daily as needed for muscle spasms. (Patient not taking: Reported on 11/03/2023)   diclofenac Sodium (VOLTAREN) 1 % GEL Place onto the skin. (Patient not taking: Reported on 11/03/2023)   gabapentin (NEURONTIN) 300 MG capsule Take 1 capsule (300 mg total) by mouth at bedtime.   LamoTRIgine (LAMICTAL XR) 25 MG TB24 24 hour tablet Take 1 tablet (25 mg total) by mouth daily for 7 days, THEN 2 tablets (50 mg total) daily for 7 days, THEN 3 tablets (75 mg total) daily for 7 days, THEN 4 tablets (100 mg total) daily for 7 days, THEN 5 tablets (125 mg total) daily for 7 days, THEN 6 tablets (150 mg  total) daily for 7 days.   levETIRAcetam (KEPPRA) 500 MG tablet Take 1 tablet (500 mg total) by mouth 2 (two) times daily. (Patient not taking: Reported on 11/03/2023)   PARoxetine (PAXIL) 10 MG tablet Take 1 tablet (10 mg total) by mouth daily.   No facility-administered encounter medications on file as of 11/03/2023.    Follow-up: Return in about 6 weeks (around 12/15/2023) for ANXIETY, DEPRESSION.   Raynisha Avilla R Reniya Mcclees, FNP

## 2023-11-03 NOTE — Assessment & Plan Note (Signed)
 Continue lamotrigine 200 mg daily Encouraged to maintain close follow-up with neurology

## 2023-11-03 NOTE — Assessment & Plan Note (Addendum)
 Will screen patient for sickle cell disease - Hgb Fractionation Cascade   Component Ref Range & Units 10 mo ago  Hemoglobin A Percent >96.0 % 61.1 Low   Hemoglobin A2 Percent 2.0 - 3.2 % 2.9  Hemoglobin F Percent <2.0 % <1.0  Hemoglobin S Percent % 36.0 High   RBC SO 3.80 - 5.10 4.97  Hgb SO 11.7 - 15.5 g/dl 13.0  Hemoglobin ElectropHoresis Interpretation SEE COMMENT  Comment: Consistent with hemoglobin A/S (sickle trait) Lab test performed by: Lab Mnemonic: [id number] [address] [place], [place] [address] Olam Bergeron  Hct SO 35.0 - 45.0 % 42.6  MCV SO 80.0 - 100.0 fL 85.7  MCH SO 27.0 - 33.0 pg 27.4  RDW SO 11.0 - 15.0 % 12.7   Recent CBC was normal with CBC 13.3 HCT 40.7

## 2023-11-04 ENCOUNTER — Other Ambulatory Visit: Payer: Self-pay

## 2023-11-05 ENCOUNTER — Encounter: Payer: Self-pay | Admitting: Neurology

## 2023-11-05 ENCOUNTER — Ambulatory Visit: Payer: 59 | Admitting: Neurology

## 2023-11-05 VITALS — BP 131/88 | HR 95 | Ht 60.0 in | Wt 173.0 lb

## 2023-11-05 DIAGNOSIS — R569 Unspecified convulsions: Secondary | ICD-10-CM

## 2023-11-05 DIAGNOSIS — F419 Anxiety disorder, unspecified: Secondary | ICD-10-CM

## 2023-11-05 DIAGNOSIS — I618 Other nontraumatic intracerebral hemorrhage: Secondary | ICD-10-CM

## 2023-11-05 DIAGNOSIS — G44229 Chronic tension-type headache, not intractable: Secondary | ICD-10-CM | POA: Diagnosis not present

## 2023-11-05 DIAGNOSIS — G43019 Migraine without aura, intractable, without status migrainosus: Secondary | ICD-10-CM | POA: Diagnosis not present

## 2023-11-05 DIAGNOSIS — F32A Depression, unspecified: Secondary | ICD-10-CM

## 2023-11-05 DIAGNOSIS — H5340 Unspecified visual field defects: Secondary | ICD-10-CM

## 2023-11-05 MED ORDER — PROPRANOLOL HCL ER 60 MG PO CP24: 60.0000 mg | ORAL_CAPSULE | Freq: Every day | ORAL | 0 refills | Status: DC

## 2023-11-05 MED ORDER — PAROXETINE HCL 20 MG PO TABS: 20.0000 mg | ORAL_TABLET | Freq: Every day | ORAL | 6 refills | Status: DC

## 2023-11-05 NOTE — Patient Instructions (Signed)
 Continue current medications Increase Paxil to 20 mg daily Start propranolol XR 60 mg nightly for headaches prevention Continue with ibuprofen as needed for severe headache Contact me within 30 days if propranolol is not beneficial in controlling the headaches Return in 3 months or sooner if worse.

## 2023-11-05 NOTE — Progress Notes (Signed)
 [facility]  PATIENT: April Reilly DOB: Jul 21, 1971  REQUESTING CLINICIAN: No ref. provider found HISTORY FROM: Patient REASON FOR VISIT: Headaches follow up   HISTORICAL  CHIEF COMPLAINT:  Chief Complaint  Patient presents with   Follow-up    Pt in 53, here alone  Pt is following up on chronic tension type headache.     INTERVAL HISTORY 11/05/2023:  Patient presents today for follow-up, last visit was in January, since then she tells me that she is still having headaches, on average 2-3 headaches per week.  Headache can last the whole day.  Ibuprofen help but she still having constant headache. Tells me that her anxiety is still a problem.  She lies in bed but cannot sleep for about 3 to 4 hours usually.  Fall asleep around 5 or 6 AM and wake up around 9 AM.  She remains on Paxil Denies any seizure or seizure activity.  She is currently on lamotrigine 200 mg daily, Keppra discontinued.   INTERVAL HISTORY 08/20/2023:  Patient presents today for follow-up, she is alone.  Last visit was in October, since then she has not had any seizure or seizure-like activity.  She is compliant with Keppra 500 mg twice daily but has side effect of irritability but stated it is improving.  Still complaining of headaches, patient tells me that she is undergoing therapy to help with her anxiety and depression.  She has tried Ibuprofen 800 mg once and it was able to control her headaches. Currently her daughter and grandson are staying with her. Still having memory loss, uses a note book to keep track of her daily activity and keep important information.    INTERVAL HISTORY 04/22/2023:  Patient presents for follow-up, last visit was in July since then he has not had any seizure or seizure activity.  She is compliant with her medications including Keppra 500 mg twice daily and Flexeril.  She she is not sure if she is taking the amitriptyline or not. We repeated her EEG which showed  right hemispheric slowing, no epileptiform discharges and no seizures.    s complaining of chronic headaches, we prescribed her Nurtec but it was not approved and patient started on Qulipta.  While taking the [place] she developed new hallucinations therefore discontinued.  Since discontinuing the Qulipta, she has not had any similar symptoms.  Her headaches, occur every other day.  She reports that Flexeril helps.  She still struggles with memory, reports that she wants to go back to work but unsure if she can actually hold a job because now she has right   INTERVAL HISTORY 01/28/2023:  Patient presents today for follow-up, she is alone.  Last visit was in January and since then she has travelled to [place] and saw her PCP, had workup which was unrevealing.  She reports compliance with her medications, denies any seizure or seizure like activity.  She remains on the Keppra 500 mg twice daily.  She continues to complain of headaches, chronic headaches.  She has tried topiramate and amitriptyline without any benefit.  She continues on Motrin as needed for the headaches.  Reports that her headaches are almost daily.  She still reports anxiety, feels like her anxiety is worse when she is home alone by herself, sometimes she hears a phone ring when no phone is ringing.  She reports a few weeks ago her sister came and stay with her and during that time, she did not have any hallucinations, did not  have any symptom of anxiety.  While she was in [place] also visiting family, she did not experience any hallucinations and no worsening anxiety while in the house but she will get anxious when she is in the crowd in public.   INTERVAL HISTORY 07/30/2022:  Patient presents today for follow-up, at last visit in October plan was to discontinue Topamax, start Elavil and Paxil.  Patient has not started those medications and she is still on Topamax.  She still complains of severe headaches about 2-3 times per week.  When  she had these bad headaches, she takes muscle relaxant.  She also reports increase in anxiety, trouble falling asleep, patient's reported she is scare to sleep because she lives alone.  Lately she has been sleeping with a loaded gun on her bed. She still has not set up care with a primary care physician   INTERVAL HISTORY 05/07/2022:  Felita presents today for follow-up, she is alone.  Since last visit she had an episode of severe vaginal bleeding, her Xarelto was discontinued.  Since she has been anticoagulated for 8 months,  it was reasonable to discontinue Xarelto.  She is presenting today complaining of headache, at last visit we had increased her Topamax to 100 mg nightly but she still reports headache that usually start in the back of the neck.  She reported Flexeril helps but she ran out of medication.  She also tried Fioricet which helped her.  She denies any migrainous features, denies any nausea or vomiting.  She reported she is very anxious and worry about her brain injury, she cannot remember her seizure and worried about having another seizure.  Her vision is still affected, and wonder if she will get her vision back. She has not seen oph due to lack of insurance.    INTERVAL HISTORY 03/12/2022:  Patient presents today for follow-up, last visit was in June at that time plan was to continue with Eliquis and obtain a repeat CT venogram.  She reported she ran out of Eliquis and actually her insurance dropped her because of FMLA.  Luckily she did follow-up with Dr. Al Pimple, hematologist and patient was started on Xarelto.  She did have her CT venogram yesterday but pending official read.  She reports that she still having occasional headaches that is relieved by muscle relaxant, her vision is still poor and she is pending a ophthalmology follow-up next week.  Denies any recent fall.  Denies any new focal weakness.     INTERVAL HISTORY 12/24/21:  Patient presents today for follow-up, her CT venogram  was repeated and it showed residual nonocclusive thrombus involving the distal right transverse and sigmoid sinus sinuses.,  Persistent but somewhat improved in appearance as compared to previous exam.  No new dural sinus thrombosis.  She remains on Eliquis 5 mg twice daily.  She did follow-up with primary care doctor and hematology.  She is pending a complete hematology work-up.  Currently she is complaining of left shoulder arm pain, some migraine headaches.  She ran out of some of her medications more than a month ago and have not refill it.    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  She was initiated on anticoagulation.  Neurosurgery was consulted but patient did not have a craniectomy.  She developed seizures, started on Keppra  Her symptoms improve, she was discharge to acute rehab and her plan was to continue with anticoagulation for at least 6 months.  Patient report prior to her hospitalization she has been doing well, she has chronic anemia, sickle cell disease but she was not taken on any medication.  She reports a few weeks prior to presentation she developed vaginal bleeding and she was started on megestrol acetate.  Since discharge from the hospital, daughter reported improvement of her mental status, confusion improved, she does continue with physical therapy and she is getting more independent.  She is compliant with all medications.  Patient reports prior to her hospitalization she was not taking any medication but since discharge she  is on 13 medications, she feels like she does not need all of them.   Hospital course below. Ashlye Oviedo is a 53 y.o. female with history of SS trait, uterine fibroid, abnormal vaginal bleeding x4 months who was admitted on 09/10/2021 with 3-day history of malaise, fall and progressive somnolence.  She was found to have RLL aspiration pneumonia as well as IPH due to right hemorrhagic infarct with edema and right frontal, parietal, occipital and temporal lobes and 9 mm right to left midline shift.  She was started on hypertonic saline and EEG done showing evidence of epileptogenicity with high potential for seizures.  Work-up also revealed subocclusive clot in right distal right transverse sinus and right sigmoid dural venous sinus and she was started on IV heparin.  Dr. Roda Shutters felt that patient with large right frontoparietal venous infarct with hemorrhagic conversion due to unclear etiology.  Neurosurgery was consulted due to concerns about the herniation and recommended conservative care with serial CCT for monitoring. She was loaded with Dilantin due to concerns of seizure and placed on long-term EEG.  Dilantin was discontinued and Keppra added on 02/26.  Hypertonic saline was weaned off and mentation was slowly improving.  Repeat CTA/V of 02/27 showed evolving hemorrhagic infarct with mild improvement in mass effect and improved nonocclusive thrombus in right distal transverse and sigmoid sinus.  She was transition to DOAC on 02/28 with neurology recommending 3 to 6 months of AC as well as close neurological follow-up.  Hospital course was significant for issues with headaches, hypotension, tachycardia, left-sided weakness with balance deficits as well as cognitive deficits and disorientation.  CIR was recommended due to functional decline.    OTHER MEDICAL CONDITIONS: SS Trait, anemia    REVIEW OF SYSTEMS: Full 14 system review of systems performed and negative with exception of: as noted in the HPI    ALLERGIES: Allergies  Allergen Reactions   Megestrol Other (See Comments) and Swelling    Caused blood clot in brain  Caused blood clot in brain    Caused blood clot in brain Caused blood clot in brain   Qulipta [Atogepant] Anxiety    HALLUCINATION    HOME MEDICATIONS: Outpatient Medications Prior to Visit  Medication Sig Dispense Refill   aspirin EC 81 MG tablet Take 81 mg by mouth daily. Swallow whole.     cyclobenzaprine (FLEXERIL) 10 MG tablet Take 1 tablet (10 mg total) by mouth 3 (three) times daily as needed for muscle spasms. 30 tablet 3   gabapentin (  NEURONTIN) 300 MG capsule Take 1 capsule (300 mg total) by mouth at bedtime. 90 capsule 0   ibuprofen (ADVIL) 800 MG tablet TAKE 1 TABLET (800 MG TOTAL) BY MOUTH EVERY 8 (EIGHT) HOURS AS NEEDED FOR HEADACHE. 10 tablet 0   LamoTRIgine 200 MG TB24 24 hour tablet Take 1 tablet (200 mg total) by mouth daily. 30 tablet 11   diclofenac Sodium (VOLTAREN) 1 % GEL Place onto the skin.     levETIRAcetam (KEPPRA) 500 MG tablet Take 1 tablet (500 mg total) by mouth 2 (two) times daily. 180 tablet 3   PARoxetine (PAXIL) 10 MG tablet Take 1 tablet (10 mg total) by mouth daily. 30 tablet 3   LamoTRIgine (LAMICTAL XR) 25 MG TB24 24 hour tablet Take 1 tablet (25 mg total) by mouth daily for 7 days, THEN 2 tablets (50 mg total) daily for 7 days, THEN 3 tablets (75 mg total) daily for 7 days, THEN 4 tablets (100 mg total) daily for 7 days, THEN 5 tablets (125 mg total) daily for 7 days, THEN 6 tablets (150 mg total) daily for 7 days. 200 tablet 0   No facility-administered medications prior to visit.    PAST MEDICAL HISTORY: Past Medical History:  Diagnosis Date   Asthma    Stroke (HCC)    Vaginal bleeding, abnormal    for 4 months    PAST SURGICAL HISTORY: Past Surgical History:  Procedure Laterality Date   FOOT SURGERY     TUBAL LIGATION      FAMILY HISTORY: Family History  Problem Relation Age of Onset   Clotting disorder  Mother     SOCIAL HISTORY: Social History   Socioeconomic History   Marital status: Single    Spouse name: Not on file   Number of children: 4   Years of education: Not on file   Highest education level: Not on file  Occupational History   Not on file  Tobacco Use   Smoking status: Never   Smokeless tobacco: Never  Substance and Sexual Activity   Alcohol use: Not Currently   Drug use: Never   Sexual activity: Not Currently  Other Topics Concern   Not on file  Social History Narrative      Right handed   Caffeine ocassional   Lives alone with dog   Social Drivers of Health   Financial Resource Strain: Not on file  Food Insecurity: No Food Insecurity (01/01/2023)   Received from [facility] [employer] Insecurity    Within the past 12 months, you worried that your food would run out before you got the money to buy more.: Never true    Within the past 12 months, the food you bought just didn't last and you didn't have money to get more.: Never true  Transportation Needs: No Transportation Needs (01/01/2023)   Received from [employer] - Transportation    Lack of Transportation (Medical): No    Lack of Transportation (Non-Medical): No  Physical Activity: Not on file  Stress: Low Risk  (02/27/2020)   Received from [facility]   Stress    Stress Risk: Not on file    General Anxiety Disorder (GAD-7) Score: Not on file  Social Connections: Not on file  Intimate Partner Violence: Not At Risk (01/01/2023)   Received from [facility]   Intimate Partner Violence    In the past 12 months, has the patient been fearful of being subjected to or  has experienced physical/emotional abuse from an intimate partner, other family member , friend or domestic partner?: No    PHYSICAL EXAM  GENERAL EXAM/CONSTITUTIONAL: Vitals:  Vitals:   11/05/23 1104  BP: 131/88  Pulse: 95  Weight: 173 lb (78.5 kg)  Height: 5' (1.524 m)    Body mass index is  33.79 kg/m. Wt Readings from Last 3 Encounters:  11/05/23 173 lb (78.5 kg)  11/03/23 177 lb (80.3 kg)  08/20/23 171 lb (77.6 kg)   Patient is in no distress; well developed, nourished and groomed; neck is supple  MUSCULOSKELETAL: Gait, strength, tone, movements noted in Neurologic exam below  NEUROLOGIC: MENTAL STATUS:      No data to display         awake, alert, oriented to person, place and time Difficulty with recent memory, has to write thing down  normal attention and concentration language fluent, comprehension intact, naming intact fund of knowledge appropriate  CRANIAL NERVE:  2nd, 3rd, 4th, 6th - Left visual field cut, extraocular muscles intact, no nystagmus 5th - facial sensation symmetric 7th - facial strength symmetric 8th - hearing intact 9th - palate elevates symmetrically, uvula midline 11th - shoulder shrug symmetric 12th - tongue protrusion midline  MOTOR:  normal bulk and tone, full strength in the BUE, BLE  SENSORY:  normal and symmetric to light touch  COORDINATION:  finger-nose-finger, fine finger movements normal  GAIT/STATION:  normal   DIAGNOSTIC DATA (LABS, IMAGING, TESTING) - I reviewed patient records, labs, notes, testing and imaging myself where available.  Lab Results  Component Value Date   WBC 4.2 10/19/2021   HGB 12.5 10/19/2021   HCT 39.8 10/19/2021   MCV 78.7 (L) 10/19/2021   PLT 253 10/19/2021      Component Value Date/Time   NA 137 10/19/2021 1250   K 3.9 10/19/2021 1250   CL 106 10/19/2021 1250   CO2 23 10/19/2021 1250   GLUCOSE 92 10/19/2021 1250   BUN <5 (L) 10/19/2021 1250   CREATININE 0.76 10/19/2021 1250   CALCIUM 9.3 10/19/2021 1250   PROT 6.8 09/20/2021 0544   ALBUMIN 3.0 (L) 09/20/2021 0544   AST 53 (H) 09/20/2021 0544   ALT 80 (H) 09/20/2021 0544   ALKPHOS 83 09/20/2021 0544   BILITOT 0.3 09/20/2021 0544   GFRNONAA >60 10/19/2021 1250   Lab Results  Component Value Date   CHOL 139  09/11/2021   HDL 46 09/11/2021   LDLCALC 84 09/11/2021   TRIG 47 09/11/2021   CHOLHDL 3.0 09/11/2021   Lab Results  Component Value Date   HGBA1C 5.3 09/11/2021   Lab Results  Component Value Date   VITAMINB12 364 09/11/2021   Lab Results  Component Value Date   TSH 0.164 (L) 09/11/2021    CT Venogram 11/28/2021 1. 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 Previously seen blood products have resolved. 2. Residual nonocclusive thrombus involving the distal right transverse and sigmoid sinuses, persistent but somewhat improved in appearance as compared to previous exam. No new dural sinus thrombosis. 3. No other new acute intracranial abnormality.  CT Venogram 09/17/21. Evolving hemorrhagic infarction primarily involving temporal and occipital lobes with extension into the parietal lobe. No new hemorrhage. Mass effect is stable to mildly improved. Persistent but improved nonocclusive thrombus within the right distal transverse and sigmoid sinuses   Head CT 09/10/21 Large region of cortical/subcortical edema within the right frontal, parietal, occipital and temporal lobes, as  well as right insula and subinsular region. Superimposed patchy foci of acute parenchymal hemorrhage within the right parietal, occipital and temporal lobes. These findings are favored to reflect an acute/early subacute infarct from arterial ischemia (with hemorrhagic conversion) or a hemorrhagic infarct due to intracranial venous thrombosis. An MRI of the brain with contrast (including thin-slice T1-weighted post-contrast imaging for assessment of the intracranial venous structures) is recommended for further evaluation. A CTA of the head/neck should also be considered.   Associated mass effect with significant partial effacement of the right lateral ventricle and 9 mm leftward midline shift. The basal cisterns are effaced.  Neurosurgical consultation is advised.   Moderate-volume extension of hemorrhage into the right lateral ventricle is questioned. No definite evidence of hydrocephalus or ventricular entrapment at this time   MRI Brain 09/10/21 1. Large area of cortical infarction in the right temporal, occipital, parietal, and posterior frontal lobes, with significant associated edema and redemonstrated intraparenchymal hemorrhage. The mass effect causes 11 mm of right-to-left midline shift, medial displacement of the right uncus and right temporal horn and effacement of the basal cisterns, unchanged from the prior CT and raising concern for right uncal herniation. 2. Additional focal area of restricted diffusion in the medial aspect of the right basal ganglia, suspected to be in the preoptic area of the hypothalamus, likely sequela of aforementioned mass effect. 3. Hemorrhage layering in the occipital horns of the lateral ventricles, with redemonstrated effacement of the right lateral and third ventricle. No evidence of hydrocephalus. 4. Small amount of subdural hemorrhage along the right frontal lobe, measuring up to 4 mm. 5. Filling defect in the distal right transverse sinus and proximal and mid right sigmoid sinus, consistent with thrombus, as seen on the same-day CTV.   LTM EEG 09/11/21 - Sharp waves,  right hemisphere, maximal right parieto-occipital region  - Continuous slow, generalized and lateralized right hemisphere   Routine EEG 02/17/2023 - Right frontotemporal slowing    ASSESSMENT AND PLAN  53 y.o. 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  Patient did have subsequent seizures and brain edema but no seizures since leaving the hospital in February 2023.  She has been anticoagulated initially on heparin and DOAC and now off anticoagulation and on  Aspirin alone. She understands the Aspirin is lifelong.   She is still complaining of severe headaches, she has failed both topiramate and amitriptyline, had hallucinations with Qulipa, Nurtec not approved. Gabapentin nightly is not helping, will give her a trial of Propanolol XR 60 mg nightly. If she continue to experience headaches, will consider CGRPs injection prior to Botox treatment. She voices understanding.   For anxiety, will increase Paxil to 20 mg daily, continue with therapy  In terms of the seizures, she has not had any seizures since leaving the hospital in February 2023.  Will continue her on Lamotrigine XR 200 mg daily. I will see her in 3 months for follow up.    1. Chronic tension-type headache, not intractable   2. Intractable migraine without aura and without status migrainosus   3. Anxiety   4. Seizure (HCC)   5. Other right-sided nontraumatic intracerebral hemorrhage (HCC)   6. Visual field cut   7. Anxiety and depression       Patient Instructions  Continue current medications Increase Paxil to 20 mg daily Start propranolol XR 60 mg nightly for headaches prevention  Continue with ibuprofen as needed for severe headache Contact me within 30 days if propranolol is not beneficial in controlling the headaches Return in 3 months or sooner if worse.  No orders of the defined types were placed in this encounter.   Meds ordered this encounter  Medications   propranolol ER (INDERAL LA) 60 MG 24 hr capsule    Sig: Take 1 capsule (60 mg total) by mouth daily.    Dispense:  30 capsule    Refill:  0   PARoxetine (PAXIL) 20 MG tablet    Sig: Take 1 tablet (20 mg total) by mouth daily.    Dispense:  30 tablet    Refill:  6    Please establish with PCP for further refills.    Return in about 2 months (around 01/09/2024).    Cassandra Cleveland, MD 11/05/2023, 3:24 PM  [facility] [address] [place], [place] [address] [phone]

## 2023-11-06 ENCOUNTER — Ambulatory Visit

## 2023-11-10 ENCOUNTER — Ambulatory Visit
Admission: RE | Admit: 2023-11-10 | Discharge: 2023-11-10 | Disposition: A | Discharge status: Home / Self Care | Source: Ambulatory Visit | Attending: Nurse Practitioner | Admitting: Nurse Practitioner

## 2023-11-10 ENCOUNTER — Other Ambulatory Visit: Payer: Self-pay | Admitting: Nurse Practitioner

## 2023-11-10 DIAGNOSIS — R569 Unspecified convulsions: Secondary | ICD-10-CM

## 2023-11-10 DIAGNOSIS — M542 Cervicalgia: Secondary | ICD-10-CM

## 2023-11-10 DIAGNOSIS — F32A Depression, unspecified: Secondary | ICD-10-CM

## 2023-11-10 DIAGNOSIS — G08 Intracranial and intraspinal phlebitis and thrombophlebitis: Secondary | ICD-10-CM

## 2023-11-10 DIAGNOSIS — D509 Iron deficiency anemia, unspecified: Secondary | ICD-10-CM

## 2023-11-10 DIAGNOSIS — Z1231 Encounter for screening mammogram for malignant neoplasm of breast: Secondary | ICD-10-CM

## 2023-11-10 DIAGNOSIS — N938 Other specified abnormal uterine and vaginal bleeding: Secondary | ICD-10-CM

## 2023-11-10 DIAGNOSIS — G44229 Chronic tension-type headache, not intractable: Secondary | ICD-10-CM

## 2023-11-10 DIAGNOSIS — G8929 Other chronic pain: Secondary | ICD-10-CM

## 2023-11-29 ENCOUNTER — Other Ambulatory Visit: Payer: Self-pay | Admitting: Neurology

## 2023-11-29 DIAGNOSIS — F419 Anxiety disorder, unspecified: Secondary | ICD-10-CM

## 2023-12-08 ENCOUNTER — Telehealth: Payer: Self-pay | Admitting: Neurology

## 2023-12-08 NOTE — Telephone Encounter (Addendum)
 Pt said, went to [place]  to for an appointment with my PCP. PCP in [place]   did refills forall medication including the ones Dr. Samara Crest prescribes. Insurance denied due to do cover medication prescriptions from another state. Need refills for all my medications including the one prescribed by my [place]  PCP.  I informed her that Gabapentin ,  was prescribed by another physician and Albuterol is not listed on medication list. I can send a refill for medications prescribed by Dr. Samara Crest. Patient said I will come over there to talk to someone and she hung up on me.

## 2023-12-08 NOTE — Telephone Encounter (Signed)
 Please inform me when/if patient shows up. I will talk to her.  Thanks

## 2023-12-17 ENCOUNTER — Ambulatory Visit: Payer: Self-pay | Admitting: Nurse Practitioner

## 2023-12-25 ENCOUNTER — Encounter (HOSPITAL_COMMUNITY): Payer: Self-pay | Admitting: Clinical

## 2023-12-25 ENCOUNTER — Ambulatory Visit (HOSPITAL_COMMUNITY): Admitting: Clinical

## 2023-12-25 DIAGNOSIS — G936 Cerebral edema: Secondary | ICD-10-CM | POA: Diagnosis not present

## 2023-12-25 DIAGNOSIS — F064 Anxiety disorder due to known physiological condition: Secondary | ICD-10-CM

## 2023-12-25 DIAGNOSIS — F0631 Mood disorder due to known physiological condition with depressive features: Secondary | ICD-10-CM

## 2023-12-25 NOTE — Progress Notes (Signed)
 THERAPIST PROGRESS NOTE  Session Time:   3:01pm-4:00pm  Session #32  Virtual Visit via Video Note  I connected with April Reilly on 12/25/23 at  3:00 PM EDT by a video enabled telemedicine application and verified that I am speaking with the correct person using two identifiers.  Location: Patient: friend's house Provider: home office   I discussed the limitations of evaluation and management by telemedicine and the availability of in person appointments. The patient expressed understanding and agreed to proceed.   I discussed the assessment and treatment plan with the patient. The patient was provided an opportunity to ask questions and all were answered. The patient agreed with the plan and demonstrated an understanding of the instructions.   The patient was advised to call back or seek an in-person evaluation if the symptoms worsen or if the condition fails to improve as anticipated.  I provided 59 minutes of non-face-to-face time during this encounter.  Ancel Kass, LCSW   Participation Level: Active  Behavioral Response: Casual Alert Negative and Irritable\   Type of Therapy: Individual Therapy   Treatment Goals addressed:  STG: Lyan will practice problem solving skills 3 times per week for the next 4 weeks. STG: Sheronda will reduce frequency of avoidant behaviors by 50% as evidenced by self-report in therapy sessions LTG: Esterlene will be able to acknowledge and accept her new way of life with the brain injury 5 days out of 7 STG: Ilena will practice how to handle anxiety-provoking situations where she is faced with sharing information about her brain injury and/or making decisions LTG: Reduce frequency, intensity, and duration of depression symptoms so that daily functioning is improved LTG: Cherl will be able to identify reasons her brain injury can cause depression symptoms and recognize when this is happening. STG: Mackinzie will be able to talk about  her brain injury without becoming overwhelmed with negative emotion. LTG: Jerre will engage in at least 1 social activity outside of family per month STG: Kimarie will learn about boundaries and how to set them AEB her ability to successful set boundaries by self-report STG: Score less than 9 on the PHQ-9 and less than 5 on the GAD-7 as evidenced by intermittent administration of the questionnaires to determine progress in managing depression and anxiety.  LTG: Learn and practice communication techniques such as active listening, "I" statements, open-ended questions, fair fighting rules, initiating conversations;  learn about boundary types and how to implement/enforce them AEB self-report of use of same.  STG: Learn breathing techniques and grounding techniques at an age-appropriate and ability-appropriate level and demonstrate mastery in session then report independent use of these skills out of session.    STG: Process life events to the extent needed so that will be able to move forward with various areas of life in a better frame of mind per self-report.    ProgressTowards Goals: Progressing  Interventions: Assertiveness Training, Supportive, and Reframing    Summary: April Reilly is a 53 y.o. 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  She presented oriented x5 and stated she was feeling "irritated."  CSW evaluated patient's medication compliance, use of coping tools, and self-care, as applicable.  She provided an update on various aspects of her life that are normally discussed in therapy, including trips to [place] , visits with doctors there, issues with oldest daughter, thoughts about moving back to [place] .   She shared that  she has added a hobby of going to movies on Tuesdays ($5 day) by herself, since her neurologist said that going to the gun range "was not a hobby."  She has had several  different times of feeling like "giving up" although she did not elaborate on what this meant.  She does not like the local sickle cell doctor she has seen here so is going to return every 3 months or so to [place]  to see the doctor there who has been her provider for years.  She saw Dr. Lenora Radish who wrote out prescriptions for her, but she has had trouble getting them [place] across state [place] because of her [place] insurance.  The doctors in [place]  stated that her symptoms can indeed be different in another state due to the air, pressure, and environment.  CSW mentioned that stress also plays a factor in her symptoms acting up.  She is seriously considering a return to living in [place] .  When asked what has changed since she originally moved here to be close to daughters and grand-children, she said she is not as needed, not as wanted, has additional health problems, and the grandkids are older now.  Her daughter and grandson who are living with her are supposed to move out this month and she has told her daughter that she will not be allowed to move back in, so she needs to make sure.  Her oldest daughter is angry with her to the extent that she would not allow her to come to her granddaughter's birthday party.  She told this story with a lot of anger and also sadness.  She was encouraged to keep thinking about it, noticing when and where she gets her headaches and sickle cell pain, and not make any decisions quickly because there is not a time crunch for it.  Suicidal/Homicidal: No without intent/plan  Therapist Response:   Patient is progressing AEB engaging in scheduled therapy session.  Throughout the session, CSW gave patient the opportunity to explore thoughts and feelings associated with current life situations and past/present stressors.   CSW challenged patient gently and appropriately to consider different ways of looking at reported issues. CSW encouraged patient's expression of feelings and  validated these using empathy, active listening, open body language, and unconditional positive regard.   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 Patient/Guardian was advised if they have not already done so to contact the registration department to sign all necessary forms in order for us  to release information regarding their care.   Consent: Patient/Guardian gives verbal consent for treatment and assignment of benefits for services provided during this visit. Patient/Guardian expressed understanding and agreed to proceed.         Ancel Kass, LCSW 12/25/2023

## 2024-01-08 ENCOUNTER — Ambulatory Visit (INDEPENDENT_AMBULATORY_CARE_PROVIDER_SITE_OTHER): Admitting: Clinical

## 2024-01-08 ENCOUNTER — Encounter (HOSPITAL_COMMUNITY): Payer: Self-pay | Admitting: Clinical

## 2024-01-08 DIAGNOSIS — G936 Cerebral edema: Secondary | ICD-10-CM

## 2024-01-08 DIAGNOSIS — F064 Anxiety disorder due to known physiological condition: Secondary | ICD-10-CM | POA: Diagnosis not present

## 2024-01-08 DIAGNOSIS — F0631 Mood disorder due to known physiological condition with depressive features: Secondary | ICD-10-CM

## 2024-01-08 NOTE — Progress Notes (Signed)
 THERAPIST PROGRESS NOTE  Session Time:   4:00pm-5:00pm  Session #33  Participation Level: Active  Behavioral Response: Casual Alert Euthymic   Type of Therapy: Individual Therapy   Treatment Goals addressed:  STG: Trany will practice problem solving skills 3 times per week for the next 4 weeks. STG: Lauryn will reduce frequency of avoidant behaviors by 50% as evidenced by self-report in therapy sessions LTG: Harlem will be able to acknowledge and accept her new way of life with the brain injury 5 days out of 7 STG: Mykaila will practice how to handle anxiety-provoking situations where she is faced with sharing information about her brain injury and/or making decisions LTG: Reduce frequency, intensity, and duration of depression symptoms so that daily functioning is improved LTG: Seleena will be able to identify reasons her brain injury can cause depression symptoms and recognize when this is happening. STG: Shawnetta will be able to talk about her brain injury without becoming overwhelmed with negative emotion. LTG: Laterica will engage in at least 1 social activity outside of family per month STG: Jalissa will learn about boundaries and how to set them AEB her ability to successful set boundaries by self-report STG: Score less than 9 on the PHQ-9 and less than 5 on the GAD-7 as evidenced by intermittent administration of the questionnaires to determine progress in managing depression and anxiety.  LTG: Learn and practice communication techniques such as active listening, I statements, open-ended questions, fair fighting rules, initiating conversations;  learn about boundary types and how to implement/enforce them AEB self-report of use of same.  STG: Learn breathing techniques and grounding techniques at an age-appropriate and ability-appropriate level and demonstrate mastery in session then report independent use of these skills out of session.    STG: Process life events to the extent  needed so that will be able to move forward with various areas of life in a better frame of mind per self-report.    ProgressTowards Goals: Progressing  Interventions: Assertiveness Training and Supportive    Summary: April Reilly is a 53 y.o. 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  She presented oriented x5 and stated she was missing [place] , really wanted to stay.  CSW evaluated patient's medication compliance, use of coping tools, and self-care, as applicable.  She provided an update on various aspects of her life that are normally discussed in therapy, including her trip to [place] , her new/old boyfriend, problems with her daughter who lives with her, and finally seeking a divorce after 24 years of separation.  She really would like to move back to [place]  but has not made that decision yet.  Her best female friend of 30 years and she have decided to give romance a try.  Her daughter who lives with her cannot move out yet because she did not pass the final test to become a Engineer, civil (consulting).  She did retrieve her marriage license while in [place]  so that she can proceed with getting a divorce.  We explored all these topics and CSW provided support and encouragement.    Suicidal/Homicidal: No without intent/plan  Therapist Response:   Patient is progressing AEB engaging in scheduled therapy session.  Throughout the session, CSW gave patient the opportunity to explore thoughts and feelings associated with current life situations and past/present stressors.   CSW challenged patient gently and appropriately to consider different ways of looking at reported issues. CSW  encouraged patient's expression of feelings and validated these using empathy, active listening, open body language, and unconditional positive regard.        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 Patient/Guardian was advised if they have not already done so to contact the registration department to sign all necessary forms in order for us  to release information regarding their care.   Consent: Patient/Guardian gives verbal consent for treatment and assignment of benefits for services provided during this visit. Patient/Guardian expressed understanding and agreed to proceed.    Ancel Kass, LCSW 01/08/2024

## 2024-01-09 ENCOUNTER — Encounter: Payer: Self-pay | Admitting: Neurology

## 2024-01-09 ENCOUNTER — Other Ambulatory Visit (HOSPITAL_COMMUNITY): Payer: Self-pay

## 2024-01-09 ENCOUNTER — Ambulatory Visit: Admitting: Neurology

## 2024-01-09 VITALS — BP 116/77 | HR 92 | Ht 59.0 in | Wt 172.2 lb

## 2024-01-09 DIAGNOSIS — G44229 Chronic tension-type headache, not intractable: Secondary | ICD-10-CM

## 2024-01-09 DIAGNOSIS — F419 Anxiety disorder, unspecified: Secondary | ICD-10-CM

## 2024-01-09 DIAGNOSIS — G08 Intracranial and intraspinal phlebitis and thrombophlebitis: Secondary | ICD-10-CM

## 2024-01-09 DIAGNOSIS — R413 Other amnesia: Secondary | ICD-10-CM

## 2024-01-09 DIAGNOSIS — F32A Depression, unspecified: Secondary | ICD-10-CM

## 2024-01-09 DIAGNOSIS — H5340 Unspecified visual field defects: Secondary | ICD-10-CM | POA: Diagnosis not present

## 2024-01-09 DIAGNOSIS — M542 Cervicalgia: Secondary | ICD-10-CM

## 2024-01-09 DIAGNOSIS — M25512 Pain in left shoulder: Secondary | ICD-10-CM

## 2024-01-09 DIAGNOSIS — G8929 Other chronic pain: Secondary | ICD-10-CM

## 2024-01-09 MED ORDER — CYCLOBENZAPRINE HCL 10 MG PO TABS
10.0000 mg | ORAL_TABLET | Freq: Three times a day (TID) | ORAL | 3 refills | Status: DC | PRN
  Filled 2024-01-09: qty 30, 10d supply, fill #0

## 2024-01-09 MED ORDER — GABAPENTIN 300 MG PO CAPS
300.0000 mg | ORAL_CAPSULE | Freq: Every day | ORAL | 3 refills | Status: AC
  Filled 2024-01-09: qty 90, 90d supply, fill #0

## 2024-01-09 MED ORDER — PAROXETINE HCL 20 MG PO TABS
20.0000 mg | ORAL_TABLET | Freq: Every day | ORAL | 3 refills | Status: DC
  Filled 2024-01-09: qty 30, 30d supply, fill #0

## 2024-01-09 MED ORDER — ALBUTEROL SULFATE HFA 108 (90 BASE) MCG/ACT IN AERS
2.0000 | INHALATION_SPRAY | Freq: Four times a day (QID) | RESPIRATORY_TRACT | 3 refills | Status: AC | PRN
  Filled 2024-01-09: qty 18, 20d supply, fill #0
  Filled 2024-04-15: qty 18, 30d supply, fill #0

## 2024-01-09 MED ORDER — LAMOTRIGINE ER 200 MG PO TB24
200.0000 mg | ORAL_TABLET | Freq: Every day | ORAL | 3 refills | Status: DC
  Filled 2024-01-09: qty 90, 90d supply, fill #0

## 2024-01-09 MED ORDER — ASPIRIN 81 MG PO TBEC
81.0000 mg | DELAYED_RELEASE_TABLET | Freq: Every day | ORAL | 3 refills | Status: AC
  Filled 2024-01-09: qty 90, 90d supply, fill #0

## 2024-01-09 MED ORDER — PROPRANOLOL HCL ER 60 MG PO CP24
60.0000 mg | ORAL_CAPSULE | Freq: Every day | ORAL | 3 refills | Status: DC
  Filled 2024-01-09: qty 90, 90d supply, fill #0

## 2024-01-09 MED ORDER — IBUPROFEN 800 MG PO TABS
800.0000 mg | ORAL_TABLET | Freq: Three times a day (TID) | ORAL | 0 refills | Status: DC | PRN
  Filled 2024-01-09: qty 10, 4d supply, fill #0

## 2024-01-09 NOTE — Progress Notes (Signed)
 [facility]  PATIENT: April Reilly DOB: 11/18/1970  REQUESTING CLINICIAN: Paseda, April R, FNP HISTORY FROM: Patient REASON FOR VISIT: Headaches follow up   HISTORICAL  CHIEF COMPLAINT:  Chief Complaint  Patient presents with   Follow-up    Rm 12, alone.  Has list.  When traveling,here in [place] headache every other day or 2.  [place] better.  Asking for refills on all meds.  No pcp here as yet.  No seizures.  She is asking for refills on all her meds, her pcp in [place] said could not fill.     INTERVAL HISTORY 01/09/2024 April Reilly presents today for follow-up, last visit was in April, at that time we continued her current medications and added propranolol  for headache prophylaxis.  Today she tells me she doing better, she has been traveling to [place]  recently.  Feels like her mood is better while she is in [place] , she does not experience any headaches but in [place]  she will have headaches once every couple days.  She also reports some increased stress related to her daughter and grand kid living with her.  No seizure or seizure like activity.  She still has to write things down in order to remember.   INTERVAL HISTORY 11/05/2023:  Patient presents today for follow-up, last visit was in January, since then she tells me that she is still having headaches, on average 2-3 headaches per week.  Headache can last the whole day.  Ibuprofen  help but she still having constant headache. Tells me that her anxiety is still a problem.  She lies in bed but cannot sleep for about 3 to 4 hours usually.  Fall asleep around 5 or 6 AM and wake up around 9 AM.  She remains on Paxil  Denies any seizure or seizure activity.  She is currently on lamotrigine  200 mg daily, Keppra  discontinued.   INTERVAL HISTORY 08/20/2023:  Patient presents today for follow-up, she is alone.  Last visit was in October, since then she has not had any seizure or seizure-like activity.  She is compliant with  Keppra  500 mg twice daily but has side effect of irritability but stated it is improving.  Still complaining of headaches, patient tells me that she is undergoing therapy to help with her anxiety and depression.  She has tried Ibuprofen  800 mg once and it was able to control her headaches. Currently her daughter and grandson are staying with her. Still having memory loss, uses a note book to keep track of her daily activity and keep important information.    INTERVAL HISTORY 04/22/2023:  Patient presents for follow-up, last visit was in July since then he has not had any seizure or seizure activity.  She is compliant with her medications including Keppra  500 mg twice daily and Flexeril .  She she is not sure if she is taking the amitriptyline  or not. We repeated her EEG which showed right hemispheric slowing, no epileptiform discharges and no seizures.    s complaining of chronic headaches, we prescribed her Nurtec but it was not approved and patient started on Qulipta .  While taking the Qulipta  she developed new hallucinations therefore discontinued.  Since discontinuing the Qulipta , she has not had any similar symptoms.  Her headaches, occur every other day.  She reports that Flexeril  helps.  She still struggles with memory, reports that she wants to go back to work but unsure if she can actually hold a job because now she has right   INTERVAL  HISTORY 01/28/2023:  Patient presents today for follow-up, she is alone.  Last visit was in January and since then she has travelled to [place]  and saw her PCP, had workup which was unrevealing.  She reports compliance with her medications, denies any seizure or seizure like activity.  She remains on the Keppra  500 mg twice daily.  She continues to complain of headaches, chronic headaches.  She has tried topiramate  and amitriptyline  without any benefit.  She continues on Motrin  as needed for the headaches.  Reports that her headaches are almost daily.  She still  reports anxiety, feels like her anxiety is worse when she is home alone by herself, sometimes she hears a phone ring when no phone is ringing.  She reports a few weeks ago her sister came and stay with her and during that time, she did not have any hallucinations, did not have any symptom of anxiety.  While she was in [place]  also visiting family, she did not experience any hallucinations and no worsening anxiety while in the house but she will get anxious when she is in the crowd in public.   INTERVAL HISTORY 07/30/2022:  Patient presents today for follow-up, at last visit in October plan was to discontinue Topamax , start Elavil  and Paxil .  Patient has not started those medications and she is still on Topamax .  She still complains of severe headaches about 2-3 times per week.  When she had these bad headaches, she takes muscle relaxant.  She also reports increase in anxiety, trouble falling asleep, patient's reported she is scare to sleep because she lives alone.  Lately she has been sleeping with a loaded gun on her bed. She still has not set up care with a primary care physician   INTERVAL HISTORY 05/07/2022:  April Reilly presents today for follow-up, she is alone.  Since last visit she had an episode of severe vaginal bleeding, her Xarelto  was discontinued.  Since she has been anticoagulated for 8 months,  it was reasonable to discontinue Xarelto .  She is presenting today complaining of headache, at last visit we had increased her Topamax  to 100 mg nightly but she still reports headache that usually start in the back of the neck.  She reported Flexeril  helps but she ran out of medication.  She also tried Fioricet  which helped her.  She denies any migrainous features, denies any nausea or vomiting.  She reported she is very anxious and worry about her brain injury, she cannot remember her seizure and worried about having another seizure.  Her vision is still affected, and wonder if she will get her vision  back. She has not seen oph due to lack of insurance.    INTERVAL HISTORY 03/12/2022:  Patient presents today for follow-up, last visit was in June at that time plan was to continue with Eliquis  and obtain a repeat CT venogram.  She reported she ran out of Eliquis  and actually her insurance dropped her because of FMLA.  Luckily she did follow-up with Dr. Arno Bibles, hematologist and patient was started on Xarelto .  She did have her CT venogram yesterday but pending official read.  She reports that she still having occasional headaches that is relieved by muscle relaxant, her vision is still poor and she is pending a ophthalmology follow-up next week.  Denies any recent fall.  Denies any new focal weakness.     INTERVAL HISTORY 12/24/21:  Patient presents today for follow-up, her CT venogram was repeated and it showed residual nonocclusive  thrombus involving the distal right transverse and sigmoid sinus sinuses.,  Persistent but somewhat improved in appearance as compared to previous exam.  No new dural sinus thrombosis.  She remains on Eliquis  5 mg twice daily.  She did follow-up with primary care doctor and hematology.  She is pending a complete hematology work-up.  Currently she is complaining of left shoulder arm pain, some migraine headaches.  She ran out of some of her medications more than a month ago and have not refill it.    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  She was initiated on anticoagulation.  Neurosurgery was consulted but patient did  not have a craniectomy.  She developed seizures, started on Keppra   Her symptoms improve, she was discharge to acute rehab and her plan was to continue with anticoagulation for at least 6 months.  Patient report prior to her hospitalization she has been doing well, she has chronic anemia, sickle cell disease but she was not taken on any medication.  She reports a few weeks prior to presentation she developed vaginal bleeding and she was started on megestrol  acetate.  Since discharge from the hospital, daughter reported improvement of her mental status, confusion improved, she does continue with physical therapy and she is getting more independent.  She is compliant with all medications.  Patient reports prior to her hospitalization she was not taking any medication but since discharge she is on 13 medications, she feels like she does not need all of them.   Hospital course below. April Reilly is a 53 y.o. female with history of SS trait, uterine fibroid, abnormal vaginal bleeding x4 months who was admitted on 09/10/2021 with 3-day history of malaise, fall and progressive somnolence.  She was found to have RLL aspiration pneumonia as well as IPH due to right hemorrhagic infarct with edema and right frontal, parietal, occipital and temporal lobes and 9 mm right to left midline shift.  She was started on hypertonic saline and EEG done showing evidence of epileptogenicity with high potential for seizures.  Work-up also revealed subocclusive clot in right distal right transverse sinus and right sigmoid dural venous sinus and she was started on IV heparin .  Dr. Christiane Cowing felt that patient with large right frontoparietal venous infarct with hemorrhagic conversion due to unclear etiology.  Neurosurgery was consulted due to concerns about the herniation and recommended conservative care with serial CCT for monitoring. She was loaded with Dilantin  due to concerns of seizure and placed on long-term EEG.  Dilantin  was  discontinued and Keppra  added on 02/26.  Hypertonic saline was weaned off and mentation was slowly improving.  Repeat CTA/V of 02/27 showed evolving hemorrhagic infarct with mild improvement in mass effect and improved nonocclusive thrombus in right distal transverse and sigmoid sinus.  She was transition to DOAC on 02/28 with neurology recommending 3 to 6 months of AC as well as close neurological follow-up.  Hospital course was significant for issues with headaches, hypotension, tachycardia, left-sided weakness with balance deficits as well as cognitive deficits and disorientation.  CIR  was recommended due to functional decline.    OTHER MEDICAL CONDITIONS: SS Trait, anemia    REVIEW OF SYSTEMS: Full 14 system review of systems performed and negative with exception of: as noted in the HPI   ALLERGIES: Allergies  Allergen Reactions   Megestrol  Other (See Comments) and Swelling    Caused blood clot in brain  Caused blood clot in brain    Caused blood clot in brain Caused blood clot in brain   Qulipta  [Atogepant ] Anxiety    HALLUCINATION    HOME MEDICATIONS: Outpatient Medications Prior to Visit  Medication Sig Dispense Refill   albuterol (VENTOLIN HFA) 108 (90 Base) MCG/ACT inhaler Inhale 2 puffs into the lungs.     aspirin EC 81 MG tablet Take 81 mg by mouth daily. Swallow whole.     cyclobenzaprine  (FLEXERIL ) 10 MG tablet Take 1 tablet (10 mg total) by mouth 3 (three) times daily as needed for muscle spasms. 30 tablet 3   gabapentin  (NEURONTIN ) 300 MG capsule Take 1 capsule (300 mg total) by mouth at bedtime. 90 capsule 0   ibuprofen  (ADVIL ) 800 MG tablet TAKE 1 TABLET (800 MG TOTAL) BY MOUTH EVERY 8 (EIGHT) HOURS AS NEEDED FOR HEADACHE. 10 tablet 0   LamoTRIgine  200 MG TB24 24 hour tablet Take 1 tablet (200 mg total) by mouth daily. 30 tablet 11   PARoxetine  (PAXIL ) 20 MG tablet TAKE 1 TABLET BY MOUTH EVERY DAY 90 tablet 3   propranolol  ER (INDERAL  LA) 60 MG 24 hr capsule Take 1  capsule (60 mg total) by mouth daily. 30 capsule 0   No facility-administered medications prior to visit.    PAST MEDICAL HISTORY: Past Medical History:  Diagnosis Date   Asthma    Stroke (HCC)    Vaginal bleeding, abnormal    for 4 months    PAST SURGICAL HISTORY: Past Surgical History:  Procedure Laterality Date   FOOT SURGERY     TUBAL LIGATION      FAMILY HISTORY: Family History  Problem Relation Age of Onset   Clotting disorder Mother     SOCIAL HISTORY: Social History   Socioeconomic History   Marital status: Single    Spouse name: Not on file   Number of children: 4   Years of education: Not on file   Highest education level: Not on file  Occupational History   Not on file  Tobacco Use   Smoking status: Never   Smokeless tobacco: Never  Substance and Sexual Activity   Alcohol use: Not Currently   Drug use: Never   Sexual activity: Not Currently  Other Topics Concern   Not on file  Social History Narrative      Right handed   Caffeine  ocassional   Lives alone with dog   Social Drivers of Health   Financial Resource Strain: Not on file  Food Insecurity: No Food Insecurity (01/01/2023)   Received from [facility] [employer] [facility] Insecurity    Within the past 12 months, you worried that your food would run out before you got the money to buy more.: Never true    Within the past 12 months, the food you bought just didn't last and you didn't have money to get more.: Never true  Transportation Needs: No Transportation Needs (01/01/2023)   Received from [employer] - Transportation    Lack of Transportation (Medical): No    Lack of Transportation (Non-Medical): No  Physical Activity: Not on file  Stress: Low Risk  (02/27/2020)   Received from [facility]   Stress    Stress Risk: Not on file    General Anxiety Disorder (GAD-7) Score: Not on file  Social Connections: Not on file  Intimate Partner Violence: Not At Risk (01/01/2023)    Received from [facility]   Intimate Partner Violence    In the past 12 months, has the patient been fearful of being subjected to or has experienced physical/emotional abuse from an intimate partner, other family member , friend or domestic partner?: No    PHYSICAL EXAM  GENERAL EXAM/CONSTITUTIONAL: Vitals:  Vitals:   01/09/24 0923  BP: 116/77  Pulse: 92  Weight: 172 lb 3.2 oz (78.1 kg)  Height: 4' 11 (1.499 m)    Body mass index is 34.78 kg/m. Wt Readings from Last 3 Encounters:  01/09/24 172 lb 3.2 oz (78.1 kg)  11/05/23 173 lb (78.5 kg)  11/03/23 177 lb (80.3 kg)   Patient is in no distress; well developed, nourished and groomed; neck is supple  MUSCULOSKELETAL: Gait, strength, tone, movements noted in Neurologic exam below  NEUROLOGIC: MENTAL STATUS:      No data to display         awake, alert, oriented to person, place and time Difficulty with recent memory, has to write thing down  normal attention and concentration language fluent, comprehension intact, naming intact fund of knowledge appropriate  CRANIAL NERVE:  2nd, 3rd, 4th, 6th - Left visual field cut, extraocular muscles intact, no nystagmus 5th - facial sensation symmetric 7th - facial strength symmetric 8th - hearing intact 9th - palate elevates symmetrically, uvula midline 11th - shoulder shrug symmetric 12th - tongue protrusion midline  MOTOR:  normal bulk and tone, full strength in the BUE, BLE  SENSORY:  normal and symmetric to light touch  COORDINATION:  finger-nose-finger, fine finger movements normal  GAIT/STATION:  normal   DIAGNOSTIC DATA (LABS, IMAGING, TESTING) - I reviewed patient records, labs, notes, testing and imaging myself where available.  Lab Results  Component Value Date   WBC 4.2 10/19/2021   HGB 12.5 10/19/2021   HCT 39.8 10/19/2021   MCV 78.7 (L) 10/19/2021   PLT 253 10/19/2021      Component Value Date/Time   NA 137 10/19/2021 1250   K 3.9  10/19/2021 1250   CL 106 10/19/2021 1250   CO2 23 10/19/2021 1250   GLUCOSE 92 10/19/2021 1250   BUN <5 (L) 10/19/2021 1250   CREATININE 0.76 10/19/2021 1250   CALCIUM  9.3 10/19/2021 1250   PROT 6.8 09/20/2021 0544   ALBUMIN 3.0 (L) 09/20/2021 0544   AST 53 (H) 09/20/2021 0544   ALT 80 (H) 09/20/2021 0544   ALKPHOS 83 09/20/2021 0544   BILITOT 0.3 09/20/2021 0544   GFRNONAA >60 10/19/2021 1250   Lab Results  Component Value Date   CHOL 139 09/11/2021   HDL 46 09/11/2021   LDLCALC 84 09/11/2021   TRIG 47 09/11/2021   CHOLHDL 3.0 09/11/2021   Lab Results  Component Value Date   HGBA1C 5.3 09/11/2021   Lab Results  Component Value Date   VITAMINB12 364 09/11/2021   Lab Results  Component Value Date   TSH 0.34 (A) 10/01/2023    CT Venogram 11/28/2021 1. 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 Previously seen blood products have resolved. 2. Residual nonocclusive thrombus involving the distal right transverse and sigmoid sinuses, persistent but somewhat improved  in appearance as compared to previous exam. No new dural sinus thrombosis. 3. No other new acute intracranial abnormality.  CT Venogram 09/17/21. Evolving hemorrhagic infarction primarily involving temporal and occipital lobes with extension into the parietal lobe. No new hemorrhage. Mass effect is stable to mildly improved. Persistent but improved nonocclusive thrombus within the right distal transverse and sigmoid sinuses   Head CT 09/10/21 Large region of cortical/subcortical edema within the right frontal, parietal, occipital and temporal lobes, as well as right insula and subinsular region. Superimposed patchy foci of acute parenchymal hemorrhage within the right parietal, occipital and temporal lobes. These findings are favored to reflect an acute/early subacute infarct from arterial ischemia (with hemorrhagic conversion) or a  hemorrhagic infarct due to intracranial venous thrombosis. An MRI of the brain with contrast (including thin-slice T1-weighted post-contrast imaging for assessment of the intracranial venous structures) is recommended for further evaluation. A CTA of the head/neck should also be considered.   Associated mass effect with significant partial effacement of the right lateral ventricle and 9 mm leftward midline shift. The basal cisterns are effaced. Neurosurgical consultation is advised.   Moderate-volume extension of hemorrhage into the right lateral ventricle is questioned. No definite evidence of hydrocephalus or ventricular entrapment at this time   MRI Brain 09/10/21 1. Large area of cortical infarction in the right temporal, occipital, parietal, and posterior frontal lobes, with significant associated edema and redemonstrated intraparenchymal hemorrhage. The mass effect causes 11 mm of right-to-left midline shift, medial displacement of the right uncus and right temporal horn and effacement of the basal cisterns, unchanged from the prior CT and raising concern for right uncal herniation. 2. Additional focal area of restricted diffusion in the medial aspect of the right basal ganglia, suspected to be in the preoptic area of the hypothalamus, likely sequela of aforementioned mass effect. 3. Hemorrhage layering in the occipital horns of the lateral ventricles, with redemonstrated effacement of the right lateral and third ventricle. No evidence of hydrocephalus. 4. Small amount of subdural hemorrhage along the right frontal lobe, measuring up to 4 mm. 5. Filling defect in the distal right transverse sinus and proximal and mid right sigmoid sinus, consistent with thrombus, as seen on the same-day CTV.   LTM EEG 09/11/21 - Sharp waves,  right hemisphere, maximal right parieto-occipital region  - Continuous slow, generalized and lateralized right hemisphere   Routine EEG 02/17/2023 - Right frontotemporal  slowing    ASSESSMENT AND PLAN  53 y.o. 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  Patient did have subsequent seizures and brain edema but no seizures since leaving the hospital in February 2023.  She has been anticoagulated initially on heparin  and DOAC and now off anticoagulation and on Aspirin alone. She understands the Aspirin is lifelong.   Propranolol  has been helping her headaches, she is also on Paxil  for her anxiety and depression.  Therapy has been helpful.  Plan will be to continue current medications, will switch the pharmacy to [facility], give her a 90-days supply since she has been having trouble refilling her medication.  I will see her in 6 months for follow-up or sooner if worse.    1. Chronic tension-type headache, not intractable   2. Visual field cut   3. Memory deficit   4. Anxiety and depression   5. Cerebral venous sinus thrombosis   6. Chronic left shoulder  pain   7. Neck pain      Patient Instructions  Continue current medications Medication sent to [facility] Continue follow-up with your doctors Return in 6 months or sooner if worse.  No orders of the defined types were placed in this encounter.   Meds ordered this encounter  Medications   aspirin EC 81 MG tablet    Sig: Take 1 tablet (81 mg total) by mouth daily. Swallow whole.    Dispense:  90 tablet    Refill:  3   cyclobenzaprine  (FLEXERIL ) 10 MG tablet    Sig: Take 1 tablet (10 mg total) by mouth 3 (three) times daily as needed for muscle spasms.    Dispense:  30 tablet    Refill:  3   gabapentin  (NEURONTIN ) 300 MG capsule    Sig: Take 1 capsule (300 mg total) by mouth at bedtime.    Dispense:  90 capsule    Refill:  3   ibuprofen  (ADVIL ) 800 MG tablet    Sig: Take 1 tablet (800 mg total) by mouth every 8 (eight) hours as needed for  headache.    Dispense:  10 tablet    Refill:  0   LamoTRIgine  200 MG TB24 24 hour tablet    Sig: Take 1 tablet (200 mg total) by mouth daily.    Dispense:  90 tablet    Refill:  3    TO DISPENSE ONLY AFTER MARCH 1.   PARoxetine  (PAXIL ) 20 MG tablet    Sig: Take 1 tablet (20 mg total) by mouth daily.    Dispense:  90 tablet    Refill:  3   propranolol  ER (INDERAL  LA) 60 MG 24 hr capsule    Sig: Take 1 capsule (60 mg total) by mouth daily.    Dispense:  90 capsule    Refill:  3   albuterol (VENTOLIN HFA) 108 (90 Base) MCG/ACT inhaler    Sig: Inhale 2 puffs into the lungs every 6 (six) hours as needed for wheezing or shortness of breath.    Dispense:  18 g    Refill:  3    Return in about 6 months (around 07/10/2024).   Cassandra Cleveland, MD 01/09/2024, 12:47 PM  [facility] [address], [address] [place], [place] [address] [phone]

## 2024-01-09 NOTE — Patient Instructions (Signed)
 Continue current medications Medication sent to [facility] Continue follow-up with your doctors Return in 6 months or sooner if worse.

## 2024-01-19 ENCOUNTER — Other Ambulatory Visit (HOSPITAL_COMMUNITY): Payer: Self-pay

## 2024-01-20 ENCOUNTER — Encounter (HOSPITAL_COMMUNITY): Payer: Self-pay | Admitting: Clinical

## 2024-01-20 ENCOUNTER — Ambulatory Visit (INDEPENDENT_AMBULATORY_CARE_PROVIDER_SITE_OTHER): Admitting: Clinical

## 2024-01-20 DIAGNOSIS — S069XAS Unspecified intracranial injury with loss of consciousness status unknown, sequela: Secondary | ICD-10-CM

## 2024-01-20 DIAGNOSIS — F0631 Mood disorder due to known physiological condition with depressive features: Secondary | ICD-10-CM | POA: Diagnosis not present

## 2024-01-20 DIAGNOSIS — F064 Anxiety disorder due to known physiological condition: Secondary | ICD-10-CM | POA: Diagnosis not present

## 2024-01-20 DIAGNOSIS — S069XAD Unspecified intracranial injury with loss of consciousness status unknown, subsequent encounter: Secondary | ICD-10-CM

## 2024-01-20 NOTE — Progress Notes (Signed)
 THERAPIST PROGRESS NOTE  Session Time:   3:01pm-3:56pm  Session #34  Virtual Visit via Video Note  I connected with April Reilly on 01/20/24 at  3:00 PM EDT by a video enabled telemedicine application and verified that I am speaking with the correct person using two identifiers.  Location: Patient: home Provider: home office   I discussed the limitations of evaluation and management by telemedicine and the availability of in person appointments. The patient expressed understanding and agreed to proceed.  I discussed the assessment and treatment plan with the patient. The patient was provided an opportunity to ask questions and all were answered. The patient agreed with the plan and demonstrated an understanding of the instructions.   The patient was advised to call back or seek an in-person evaluation if the symptoms worsen or if the condition fails to improve as anticipated.  I provided 55 minutes of non-face-to-face time during this encounter.  [name], LCSW   Participation Level: Active  Behavioral Response: Casual Alert Euthymic   Type of Therapy: Individual Therapy   Treatment Goals addressed:  STG: Meliana will practice problem solving skills 3 times per week for the next 4 weeks. STG: Analeia will reduce frequency of avoidant behaviors by 50% as evidenced by self-report in therapy sessions LTG: Joelys will be able to acknowledge and accept her new way of life with the brain injury 5 days out of 7 STG: Neesa will practice how to handle anxiety-provoking situations where she is faced with sharing information about her brain injury and/or making decisions LTG: Reduce frequency, intensity, and duration of depression symptoms so that daily functioning is improved LTG: Katrena will be able to identify reasons her brain injury can cause depression symptoms and recognize when this is happening. STG: Esra will be able to talk about her brain injury without  becoming overwhelmed with negative emotion. LTG: Abeeha will engage in at least 1 social activity outside of family per month STG: Leili will learn about boundaries and how to set them AEB her ability to successful set boundaries by self-report STG: Score less than 9 on the PHQ-9 and less than 5 on the GAD-7 as evidenced by intermittent administration of the questionnaires to determine progress in managing depression and anxiety.  LTG: Learn and practice communication techniques such as active listening, I statements, open-ended questions, fair fighting rules, initiating conversations;  learn about boundary types and how to implement/enforce them AEB self-report of use of same.  STG: Learn breathing techniques and grounding techniques at an age-appropriate and ability-appropriate level and demonstrate mastery in session then report independent use of these skills out of session.    STG: Process life events to the extent needed so that will be able to move forward with various areas of life in a better frame of mind per self-report.    ProgressTowards Goals: Progressing  Interventions: Assertiveness Training and Supportive    Summary: April Reilly is a 53 y.o. 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  She presented oriented x5 and stated she was feeling that it is what it is.  CSW evaluated patient's medication compliance, use of coping tools, and self-care, as applicable.  She provided an update on various aspects of her life that are normally discussed in therapy, including the ongoing stress with 2 of her daughters, being upset with her neurologist, church-going, and upcoming visit from boyfriend.  These items were discussed  to the level of detail that she wished.  She continues to put in place good boundaries with her daughters who are giving her stress right now, as well as to provide support in  an appropriate way to all her daughters.  Her boyfriend who has been a long-time friend is coming to visit for 10 days between this session and the next.  She also shared that she started divorce proceedings on-line, after being separated for many years with her estranged husband still not being willing to sign paperwork.  She has learned she can file for a divorce without his agreement.  She was pleased to report that she has been able to get paperwork from her neurologist for a handicap sticker, and said this is for a lifetime.  She is still wanting a part-time job to increase her income because it is very difficult to get all her bills paid on her disability check.  We brainstormed some ideas for how she can go about this.  She received support and encouragement to keep doing the behavioral activation and cognitive reframing that she actively engages in.  Suicidal/Homicidal: No without intent/plan  Therapist Response:   Patient is progressing AEB engaging in scheduled therapy session.  Throughout the session, CSW gave patient the opportunity to explore thoughts and feelings associated with current life situations and past/present stressors.   CSW challenged patient gently and appropriately to consider different ways of looking at reported issues. CSW encouraged patient's expression of feelings and validated these using empathy, active listening, open body language, and unconditional positive regard.        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 Patient/Guardian was advised if they have not already done so to contact the registration department to sign all necessary forms in order for us  to release information regarding their care.   Consent: Patient/Guardian gives verbal consent for treatment and assignment of benefits for services provided during this visit. Patient/Guardian expressed understanding and agreed to proceed.   [name], LCSW 01/20/2024

## 2024-02-04 ENCOUNTER — Ambulatory Visit (INDEPENDENT_AMBULATORY_CARE_PROVIDER_SITE_OTHER): Admitting: Clinical

## 2024-02-04 DIAGNOSIS — Z91198 Patient's noncompliance with other medical treatment and regimen for other reason: Secondary | ICD-10-CM

## 2024-02-04 NOTE — Progress Notes (Signed)
 Therapy Progress Note  Patient had an appointment scheduled with therapist on 02/04/2024  at 4:00pm.  CSW called patient at 4:20pm and was informed she was not aware she had an appointment today.  We reviewed her upcoming next 5 appointments and that they will be in person, not virtual.  Encounter Diagnosis  Name Primary?   Failure to attend appointment with reason given Yes     Elgie Crest, LCSW 02/04/2024, 4:29 PM

## 2024-02-05 ENCOUNTER — Encounter (HOSPITAL_COMMUNITY): Payer: Self-pay | Admitting: Clinical

## 2024-02-05 ENCOUNTER — Ambulatory Visit (INDEPENDENT_AMBULATORY_CARE_PROVIDER_SITE_OTHER): Admitting: Clinical

## 2024-02-05 DIAGNOSIS — F064 Anxiety disorder due to known physiological condition: Secondary | ICD-10-CM

## 2024-02-05 DIAGNOSIS — S069XAD Unspecified intracranial injury with loss of consciousness status unknown, subsequent encounter: Secondary | ICD-10-CM

## 2024-02-05 DIAGNOSIS — F0631 Mood disorder due to known physiological condition with depressive features: Secondary | ICD-10-CM | POA: Diagnosis not present

## 2024-02-05 NOTE — Progress Notes (Signed)
 THERAPIST PROGRESS NOTE  Session Time:   3:04pm-4:02pm  Session #35  Participation Level: Active  Behavioral Response: Casual Alert Euthymic   Type of Therapy: Individual Therapy   Treatment Goals addressed:  STG: Asaiah will practice problem solving skills 3 times per week for the next 4 weeks. STG: Arrion will reduce frequency of avoidant behaviors by 50% as evidenced by self-report in therapy sessions LTG: Barba will be able to acknowledge and accept her new way of life with the brain injury 5 days out of 7 STG: Latiana will practice how to handle anxiety-provoking situations where she is faced with sharing information about her brain injury and/or making decisions LTG: Reduce frequency, intensity, and duration of depression symptoms so that daily functioning is improved LTG: Fujiko will be able to identify reasons her brain injury can cause depression symptoms and recognize when this is happening. STG: Beautiful will be able to talk about her brain injury without becoming overwhelmed with negative emotion. LTG: Kerri will engage in at least 1 social activity outside of family per month STG: Jazzlin will learn about boundaries and how to set them AEB her ability to successful set boundaries by self-report STG: Score less than 9 on the PHQ-9 and less than 5 on the GAD-7 as evidenced by intermittent administration of the questionnaires to determine progress in managing depression and anxiety.  LTG: Learn and practice communication techniques such as active listening, I statements, open-ended questions, fair fighting rules, initiating conversations;  learn about boundary types and how to implement/enforce them AEB self-report of use of same.  STG: Learn breathing techniques and grounding techniques at an age-appropriate and ability-appropriate level and demonstrate mastery in session then report independent use of these skills out of session.    STG: Process life events to the extent  needed so that will be able to move forward with various areas of life in a better frame of mind per self-report.    ProgressTowards Goals: Progressing  Interventions: Solution Focused and Supportive    Summary: April Reilly is a 53 y.o. 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  She presented oriented x5 and stated she was feeling rushed but so glad to be here.  CSW evaluated patient's medication compliance, use of coping tools, and self-care, as applicable.  She provided an update on various aspects of her life that are normally discussed in therapy, including her visit from now-boyfriend, as well as her ongoing struggles with oldest daughter who is mad at her and youngest daughter who is living with her.  She had a wonderful time with her longtime best friend who is now her boyfriend when he came to visit.  They are talking about how to make the relationship work long-distance, when they will be able to visit each other, and even about what could possibly happen if they get married.  She is proceeding with her current divorce and is not concerned about that at all.  CSW advised her to call [employer] to find out if anything detrimental would happen to her disability check if she gets married.  She was upset that [employer] has contacted her to say they are going to start $185 per month out of her disability check to pay for her Medicare Parts A and B, feeling that will not leave enough  money for her to pay her bills.  CSW normalized this for her, saying she would  benefit possibly from finding out what might happen if she cancels her Medicare as she is threatening, and whether it would be best to keep it.  We then discussed what she could do to receive financial assistance for people who are benefiting from her largesse, such as her daughter/grandson living with her for free and parents of her  grandchildren who are on her phone plan.  Babysitting for one daughter is going to start soon and that daughter is insistent on paying her, so that could help.  CSW worked to help her understand consequences of actions she might take.  Suicidal/Homicidal: No without intent/plan  Therapist Response:   Patient is progressing AEB engaging in scheduled therapy session.  Throughout the session, CSW gave patient the opportunity to explore thoughts and feelings associated with current life situations and past/present stressors.   CSW challenged patient gently and appropriately to consider different ways of looking at reported issues. CSW encouraged patient's expression of feelings and validated these using empathy, active listening, open body language, and unconditional positive regard.        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 Patient/Guardian was advised if they have not already done so to contact the registration department to sign all necessary forms in order for us  to release information regarding their care.   Consent: Patient/Guardian gives verbal consent for treatment and assignment of benefits for services provided during this visit. Patient/Guardian expressed understanding and agreed to proceed.   [name], LCSW 02/05/2024

## 2024-02-18 ENCOUNTER — Ambulatory Visit (INDEPENDENT_AMBULATORY_CARE_PROVIDER_SITE_OTHER): Admitting: Clinical

## 2024-02-18 DIAGNOSIS — F0631 Mood disorder due to known physiological condition with depressive features: Secondary | ICD-10-CM

## 2024-02-18 DIAGNOSIS — F064 Anxiety disorder due to known physiological condition: Secondary | ICD-10-CM

## 2024-02-18 DIAGNOSIS — S069XAD Unspecified intracranial injury with loss of consciousness status unknown, subsequent encounter: Secondary | ICD-10-CM

## 2024-02-18 NOTE — Progress Notes (Unsigned)
 THERAPIST PROGRESS NOTE  Session Time:   4:04pm-5:02pm  Session #36  Participation Level: Active  Behavioral Response: Casual Alert Euthymic   Type of Therapy: Individual Therapy   Treatment Goals addressed:  STG: April Reilly will practice problem solving skills 3 times per week for the next 4 weeks. STG: April Reilly will reduce frequency of avoidant behaviors by 50% as evidenced by self-report in therapy sessions LTG: April Reilly will be able to acknowledge and accept her new way of life with the brain injury 5 days out of 7 STG: April Reilly will practice how to handle anxiety-provoking situations where she is faced with sharing information about her brain injury and/or making decisions LTG: Reduce frequency, intensity, and duration of depression symptoms so that daily functioning is improved LTG: April Reilly will be able to identify reasons her brain injury can cause depression symptoms and recognize when this is happening. STG: April Reilly will be able to talk about her brain injury without becoming overwhelmed with negative emotion. LTG: April Reilly will engage in at least 1 social activity outside of family per month STG: April Reilly will learn about boundaries and how to set them AEB her ability to successful set boundaries by self-report STG: Score less than 9 on the PHQ-9 and less than 5 on the GAD-7 as evidenced by intermittent administration of the questionnaires to determine progress in managing depression and anxiety.  LTG: Learn and practice communication techniques such as active listening, I statements, open-ended questions, fair fighting rules, initiating conversations;  learn about boundary types and how to implement/enforce them AEB self-report of use of same.  STG: Learn breathing techniques and grounding techniques at an age-appropriate and ability-appropriate level and demonstrate mastery in session then report independent use of these skills out of session.    STG: Process life events to the extent  needed so that will be able to move forward with various areas of life in a better frame of mind per self-report.    ProgressTowards Goals: Progressing  Interventions: Assertiveness Training and Supportive    Summary: April Reilly is a 53 y.o. 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  She presented oriented x5 and stated she was feeling good.  CSW evaluated patient's medication compliance, use of coping tools, and self-care, as applicable.  She provided an update on various aspects of her life that are normally discussed in therapy, including news from friends who are sick and her efforts to find an apartment.  She feels that her memory is improving, which is exciting for her, but she still gets pissed when she cannot remember as clearly as she wishes.  We celebrated this improvement.  A friend of more than 25 years has disclosed having cancer while her husband also has a cancer that is more aggressive than his last.  Patient talked about their long-term friendship and what she is doing to help them, received support and encouragement.  She disclosed that her relationship with the two oldest daughters remains poor for the same reasons as previously.  She has decided to move out of the house where she has lived for years, is optimistic about this eventually happening but realizes it is going to take time because of the saturated housing market right now.  She is planning to apply for Section 8 housing and was applauded for this new willingness.  For the first time in a long while, she did not talk about finding a job.  She is very happy in her relationship with her best friend from [place] .  She talked about several of her past relationships and how limited those men's commitments were, especially compared to what she is experiencing in her new relationship.  Life events were processed and reframed as  needed throughout the session.  Suicidal/Homicidal: No without intent/plan  Therapist Response:   Patient is progressing AEB engaging in scheduled therapy session.  Throughout the session, CSW gave patient the opportunity to explore thoughts and feelings associated with current life situations and past/present stressors.   CSW challenged patient gently and appropriately to consider different ways of looking at reported issues. CSW encouraged patient's expression of feelings and validated these using empathy, active listening, open body language, and unconditional positive regard.        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 Patient/Guardian was advised if they have not already done so to contact the registration department to sign all necessary forms in order for us  to release information regarding their care.   Consent: Patient/Guardian gives verbal consent for treatment and assignment of benefits for services provided during this visit. Patient/Guardian expressed understanding and agreed to proceed.   [name], LCSW 02/19/2024

## 2024-02-19 ENCOUNTER — Encounter (HOSPITAL_COMMUNITY): Payer: Self-pay | Admitting: Clinical

## 2024-03-03 ENCOUNTER — Ambulatory Visit (INDEPENDENT_AMBULATORY_CARE_PROVIDER_SITE_OTHER): Admitting: Clinical

## 2024-03-03 DIAGNOSIS — F064 Anxiety disorder due to known physiological condition: Secondary | ICD-10-CM | POA: Diagnosis not present

## 2024-03-03 DIAGNOSIS — F0631 Mood disorder due to known physiological condition with depressive features: Secondary | ICD-10-CM

## 2024-03-03 DIAGNOSIS — S069XAD Unspecified intracranial injury with loss of consciousness status unknown, subsequent encounter: Secondary | ICD-10-CM | POA: Diagnosis not present

## 2024-03-03 NOTE — Progress Notes (Signed)
 THERAPIST PROGRESS NOTE  Session Time:   4:00pm-5:00pm  Session #37  Participation Level: Active  Behavioral Response: Casual Alert Euthymic   Type of Therapy: Individual Therapy   Treatment Goals addressed:  STG: April Reilly will practice problem solving skills 3 times per week for the next 4 weeks. STG: April Reilly will reduce frequency of avoidant behaviors by 50% as evidenced by self-report in therapy sessions LTG: April Reilly will be able to acknowledge and accept her new way of life with the brain injury 5 days out of 7 STG: April Reilly will practice how to handle anxiety-provoking situations where she is faced with sharing information about her brain injury and/or making decisions LTG: Reduce frequency, intensity, and duration of depression symptoms so that daily functioning is improved LTG: April Reilly will be able to identify reasons her brain injury can cause depression symptoms and recognize when this is happening. STG: April Reilly will be able to talk about her brain injury without becoming overwhelmed with negative emotion. LTG: April Reilly will engage in at least 1 social activity outside of family per month STG: April Reilly will learn about boundaries and how to set them AEB her ability to successful set boundaries by self-report STG: Score less than 9 on the PHQ-9 and less than 5 on the GAD-7 as evidenced by intermittent administration of the questionnaires to determine progress in managing depression and anxiety.  LTG: Learn and practice communication techniques such as active listening, I statements, open-ended questions, fair fighting rules, initiating conversations;  learn about boundary types and how to implement/enforce them AEB self-report of use of same.  STG: Learn breathing techniques and grounding techniques at an age-appropriate and ability-appropriate level and demonstrate mastery in session then report independent use of these skills out of session.    STG: Process life events to the extent  needed so that will be able to move forward with various areas of life in a better frame of mind per self-report.    ProgressTowards Goals: Progressing  Interventions: Assertiveness Training and Supportive    Summary: April Reilly is a 53 y.o. 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  She presented oriented x5 and stated she was feeling it sucks, I take that back, it doesn't because I'm above ground and yesterday was a good day.  CSW evaluated patient's medication compliance, use of coping tools, and self-care, as applicable.  She provided an update on various aspects of her life that are normally discussed in therapy, including not being able to find 1400 pages that were sent to her from Medical Records and trying unsuccessfully to restore peace with her daughter.  CSW assisted her with information about how to contact Medical Records again to find out the date they sent the email to her so that she can locate it, as she is sure she did not delete it.  She described the process by which she was nice and reached out to offer love and go forward with her daughter who is not talking to her.  As she tried to move the relationship forward and not dwell on the past, stating It can't be changed, her daughter kept going back to reiterate what people have told her that patient said.  She pointed out to daughter that those same people told her a lot of things that daughter said, but she was willing to move past it, did not even know if it was true, and daughter remained angry  and entrenched in the gossip.  She stated that she now has peace about the situation and is moving on, because she knows she tried.  She received much support from CSW at the efforts made and at the ongoing healthy boundaries.  We also talked about her plans to move back to [place]  in 2027 and discussed her routines with long-distance  boyfriend who lives there.  She disclosed that she is sleeping better, is going to sleep much earlier, anywhere from 10pm to 3am, which is a vast improvement from 5-6am.  Suicidal/Homicidal: No without intent/plan  Therapist Response:   Patient is progressing AEB engaging in scheduled therapy session.  Throughout the session, CSW gave patient the opportunity to explore thoughts and feelings associated with current life situations and past/present stressors.   CSW challenged patient gently and appropriately to consider different ways of looking at reported issues. CSW encouraged patient's expression of feelings and validated these using empathy, active listening, open body language, and unconditional positive regard.        Plan/Recommendations:  Return to therapy at next scheduled appointment on 8/27, maintain boundaries, keep working on sleep.  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 Patient/Guardian was advised if they have not already done so to contact the registration department to sign all necessary forms in order for us  to release information regarding their care.   Consent: Patient/Guardian gives verbal consent for treatment and assignment of benefits for services provided during this visit. Patient/Guardian expressed understanding and agreed to proceed.   [name], LCSW 03/04/2024     [id number]

## 2024-03-04 ENCOUNTER — Encounter (HOSPITAL_COMMUNITY): Payer: Self-pay | Admitting: Clinical

## 2024-03-17 ENCOUNTER — Encounter (HOSPITAL_COMMUNITY): Payer: Self-pay | Admitting: Clinical

## 2024-03-17 ENCOUNTER — Ambulatory Visit (INDEPENDENT_AMBULATORY_CARE_PROVIDER_SITE_OTHER): Admitting: Clinical

## 2024-03-17 DIAGNOSIS — S069XAD Unspecified intracranial injury with loss of consciousness status unknown, subsequent encounter: Secondary | ICD-10-CM

## 2024-03-17 DIAGNOSIS — F064 Anxiety disorder due to known physiological condition: Secondary | ICD-10-CM

## 2024-03-17 DIAGNOSIS — F0631 Mood disorder due to known physiological condition with depressive features: Secondary | ICD-10-CM | POA: Diagnosis not present

## 2024-03-17 NOTE — Progress Notes (Unsigned)
 THERAPIST PROGRESS NOTE  Session Time:   4:06pm-5:00pm  Session #37  Participation Level: Active  Behavioral Response: Casual Alert Euthymic   Type of Therapy: Individual Therapy   Treatment Goals addressed:  STG: [name] will practice problem solving skills 3 times per week for the next 4 weeks. STG: [name] will reduce frequency of avoidant behaviors by 50% as evidenced by self-report in therapy sessions LTG: [name] will be able to acknowledge and accept her new way of life with the brain injury 5 days out of 7 STG: [name] will practice how to handle anxiety-provoking situations where she is faced with sharing information about her brain injury and/or making decisions LTG: Reduce frequency, intensity, and duration of depression symptoms so that daily functioning is improved LTG: [name] will be able to identify reasons her brain injury can cause depression symptoms and recognize when this is happening. STG: [name] will be able to talk about her brain injury without becoming overwhelmed with negative emotion. LTG: [name] will engage in at least 1 social activity outside of family per month STG: [name] will learn about boundaries and how to set them AEB her ability to successful set boundaries by self-report STG: Score less than 9 on the PHQ-9 and less than 5 on the GAD-7 as evidenced by intermittent administration of the questionnaires to determine progress in managing depression and anxiety.  LTG: Learn and practice communication techniques such as active listening, I statements, open-ended questions, fair fighting rules, initiating conversations;  learn about boundary types and how to implement/enforce them AEB self-report of use of same.  STG: Learn breathing techniques and grounding techniques at an age-appropriate and ability-appropriate level and demonstrate mastery in session then report independent use of these skills out of session.    STG: Process life events to the extent  needed so that will be able to move forward with various areas of life in a better frame of mind per self-report.    ProgressTowards Goals: Progressing  Interventions: Solution Focused and Supportive    Summary: April Reilly is a 53 y.o. 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  She presented oriented x5 and stated she was feeling rough, it's like I'm walking around in a fog today  CSW evaluated patient's medication compliance, use of coping tools, and self-care, as applicable.  She provided an update on various aspects of her life that are normally discussed in therapy, including finding the medical records she could not find last time, which consisted of 2,200 pages, a trip to [place]  she is taking, her potential legal case, and moving back to [place]  in 2027.  She also discussed a court case she is trying to file, was assisted to find a form that a lawyer told her about.  She vented about what is going on with her various daughters.  Suicidal/Homicidal: No without intent/plan  Therapist Response:   Patient is progressing AEB engaging in scheduled therapy session.  Throughout the session, CSW gave patient the opportunity to explore thoughts and feelings associated with current life situations and past/present stressors.   CSW challenged patient gently and appropriately to consider different ways of looking at reported issues. CSW encouraged patient's expression of feelings and validated these using empathy, active listening, open body language, and unconditional positive regard.        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 Patient/Guardian was advised if they have not already done so to contact the registration department to sign all necessary forms in order for us  to release information regarding their care.   Consent: Patient/Guardian gives verbal consent for treatment and assignment of benefits for services provided during this visit. Patient/Guardian expressed understanding and agreed to proceed.   April JINNY Crest, LCSW 03/17/2024     xzrfd

## 2024-03-31 ENCOUNTER — Encounter (HOSPITAL_COMMUNITY): Payer: Self-pay | Admitting: Clinical

## 2024-03-31 ENCOUNTER — Ambulatory Visit (INDEPENDENT_AMBULATORY_CARE_PROVIDER_SITE_OTHER): Admitting: Clinical

## 2024-03-31 DIAGNOSIS — F064 Anxiety disorder due to known physiological condition: Secondary | ICD-10-CM | POA: Diagnosis not present

## 2024-03-31 DIAGNOSIS — F0631 Mood disorder due to known physiological condition with depressive features: Secondary | ICD-10-CM | POA: Diagnosis not present

## 2024-03-31 NOTE — Progress Notes (Unsigned)
 THERAPIST PROGRESS NOTE  Session Time:   4:15pm-5:10pm  Session #38  Participation Level: Active  Behavioral Response: Casual Alert Irritable   Type of Therapy: Individual Therapy   Treatment Goals addressed:  STG: April Reilly will practice problem solving skills 3 times per week for the next 4 weeks. STG: April Reilly will reduce frequency of avoidant behaviors by 50% as evidenced by self-report in therapy sessions LTG: April Reilly will be able to acknowledge and accept her new way of life with the brain injury 5 days out of 7 STG: April Reilly will practice how to handle anxiety-provoking situations where she is faced with sharing information about her brain injury and/or making decisions LTG: Reduce frequency, intensity, and duration of depression symptoms so that daily functioning is improved LTG: April Reilly will be able to identify reasons her brain injury can cause depression symptoms and recognize when this is happening. STG: April Reilly will be able to talk about her brain injury without becoming overwhelmed with negative emotion. LTG: April Reilly will engage in at least 1 social activity outside of family per month STG: April Reilly will learn about boundaries and how to set them AEB her ability to successful set boundaries by self-report STG: Score less than 9 on the PHQ-9 and less than 5 on the GAD-7 as evidenced by intermittent administration of the questionnaires to determine progress in managing depression and anxiety.  LTG: Learn and practice communication techniques such as active listening, I statements, open-ended questions, fair fighting rules, initiating conversations;  learn about boundary types and how to implement/enforce them AEB self-report of use of same.  STG: Learn breathing techniques and grounding techniques at an age-appropriate and ability-appropriate level and demonstrate mastery in session then report independent use of these skills out of session.    STG: Process life events to the extent  needed so that will be able to move forward with various areas of life in a better frame of mind per self-report.    ProgressTowards Goals: Progressing  Interventions: Solution Focused and Supportive    Summary: April Reilly is a 53 y.o. 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  She presented oriented x5 and stated she was feeling I did not want to come, I have a headache and was in bed.  CSW evaluated patient's medication compliance, use of coping tools, and self-care, as applicable.  She provided an update on various aspects of her life that are normally discussed in therapy, including her headaches, lack of sleep, need to learn how to say no, relationship with daughter(s), and giving in instead of enforcing her boundaries.  She reported that it has been a rough couple of weeks and she wanted to tear whole rooms up and curse people out, but she did not do so.  She talked about her discussions with lawyers and her next steps, which is somewhat agitating to her because when people give her information she cannot remember it.  A variety of small irritations occurred during a recent trip to see boyfriend in [place] , and when talking with therapist she can identify that they are small, but at the time that is not what it feels like to her.  She did not directly relate this to her brain injury and was already agitated so CSW did not bring it up; however, in the past she has seen the correlation.  She also talked about several issues with oldest daughter who is still not talking  to her and now is resisting paying for some of the things patient does for the daughter's children since the father has not come through with promised payments.  Her daughter and grandson who live with her do not talk to her, which she finds very insulting in her own home, and they are not maintaining the boundaries she has on several  previous occasions told them are important to her.  She went off on them several nights ago, slamming doors and screaming/cursing, and telling them she wants everybody out of her house.  They pile dishes quite high and do not wash dishes, so she ends up giving up and doing them.  CSW pointed out once again that what this teaches them is if they wait long enough, she might become angry but they will not have to wash the dishes.  She asked CSW, What did I do wrong in raising them?  CSW suggested taking the dirty dishes and leaving them in their respective rooms, which she has been considering doing.  She also expressed annoyance that her daughter said she is saving money for furniture when she moves out, rather than helping patient with bills that are significantly higher with daughter and grandson living there.  CSW encouraged her to set a boundary/expectation and then enforce it.    Suicidal/Homicidal: No without intent/plan  Therapist Response:   Patient is progressing AEB engaging in scheduled therapy session.  Throughout the session, CSW gave patient the opportunity to explore thoughts and feelings associated with current life situations and past/present stressors.   CSW challenged patient gently and appropriately to consider different ways of looking at reported issues. CSW encouraged patient's expression of feelings and validated these using empathy, active listening, open body language, and unconditional positive regard.        Plan/Recommendations:  Return to therapy at next scheduled appointment on 9/25, tell daughter to pay a specific bill or give patient a specific amount of money monthly while living there, enforce boundaries about dishes.  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 Patient/Guardian was advised if they have not already done so to contact the registration department to sign all necessary forms in order for us  to release information regarding their care.   Consent: Patient/Guardian gives verbal consent for treatment and assignment of benefits for services provided during this visit. Patient/Guardian expressed understanding and agreed to proceed.   April JINNY Crest, LCSW 03/31/2024     xzrfd

## 2024-04-06 ENCOUNTER — Emergency Department (HOSPITAL_COMMUNITY)

## 2024-04-06 ENCOUNTER — Observation Stay (HOSPITAL_COMMUNITY)
Admission: EM | Admit: 2024-04-06 | Discharge: 2024-04-08 | Disposition: A | Discharge status: Home / Self Care | Attending: Internal Medicine | Admitting: Internal Medicine

## 2024-04-06 DIAGNOSIS — Z8673 Personal history of transient ischemic attack (TIA), and cerebral infarction without residual deficits: Secondary | ICD-10-CM | POA: Insufficient documentation

## 2024-04-06 DIAGNOSIS — Z7901 Long term (current) use of anticoagulants: Secondary | ICD-10-CM | POA: Diagnosis not present

## 2024-04-06 DIAGNOSIS — R55 Syncope and collapse: Secondary | ICD-10-CM | POA: Diagnosis not present

## 2024-04-06 DIAGNOSIS — J45909 Unspecified asthma, uncomplicated: Secondary | ICD-10-CM | POA: Diagnosis not present

## 2024-04-06 DIAGNOSIS — R519 Headache, unspecified: Secondary | ICD-10-CM

## 2024-04-06 DIAGNOSIS — R29818 Other symptoms and signs involving the nervous system: Secondary | ICD-10-CM | POA: Diagnosis not present

## 2024-04-06 DIAGNOSIS — G40909 Epilepsy, unspecified, not intractable, without status epilepticus: Secondary | ICD-10-CM | POA: Diagnosis not present

## 2024-04-06 DIAGNOSIS — G9389 Other specified disorders of brain: Secondary | ICD-10-CM | POA: Diagnosis not present

## 2024-04-06 DIAGNOSIS — Z7982 Long term (current) use of aspirin: Secondary | ICD-10-CM | POA: Diagnosis not present

## 2024-04-06 DIAGNOSIS — Z8679 Personal history of other diseases of the circulatory system: Secondary | ICD-10-CM

## 2024-04-06 DIAGNOSIS — R569 Unspecified convulsions: Principal | ICD-10-CM

## 2024-04-06 DIAGNOSIS — R531 Weakness: Secondary | ICD-10-CM | POA: Diagnosis not present

## 2024-04-06 LAB — CBG MONITORING, ED: Glucose-Capillary: 131 mg/dL — ABNORMAL HIGH (ref 70–99)

## 2024-04-06 MED ORDER — SODIUM CHLORIDE 0.9% FLUSH
3.0000 mL | Freq: Once | INTRAVENOUS | Status: AC
  Administered 2024-04-07: 3 mL via INTRAVENOUS
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
## 2024-04-06 NOTE — ED Notes (Signed)
 Code stroke called @ 2327

## 2024-04-06 NOTE — ED Notes (Signed)
 Tegeler MD notified of patient presentation, no additional orders given to present RN

## 2024-04-07 ENCOUNTER — Other Ambulatory Visit: Payer: Self-pay

## 2024-04-07 ENCOUNTER — Observation Stay (HOSPITAL_COMMUNITY)

## 2024-04-07 ENCOUNTER — Encounter (HOSPITAL_COMMUNITY): Payer: Self-pay | Admitting: Internal Medicine

## 2024-04-07 DIAGNOSIS — R569 Unspecified convulsions: Secondary | ICD-10-CM | POA: Diagnosis not present

## 2024-04-07 DIAGNOSIS — Z7401 Bed confinement status: Secondary | ICD-10-CM | POA: Diagnosis not present

## 2024-04-07 DIAGNOSIS — Z8679 Personal history of other diseases of the circulatory system: Secondary | ICD-10-CM

## 2024-04-07 DIAGNOSIS — I959 Hypotension, unspecified: Secondary | ICD-10-CM | POA: Diagnosis not present

## 2024-04-07 DIAGNOSIS — G40909 Epilepsy, unspecified, not intractable, without status epilepticus: Secondary | ICD-10-CM | POA: Diagnosis not present

## 2024-04-07 DIAGNOSIS — G9389 Other specified disorders of brain: Secondary | ICD-10-CM | POA: Diagnosis not present

## 2024-04-07 LAB — CBC WITH DIFFERENTIAL/PLATELET
Abs Immature Granulocytes: 0.05 K/uL (ref 0.00–0.07)
Basophils Absolute: 0 K/uL (ref 0.0–0.1)
Basophils Relative: 0 %
Eosinophils Absolute: 0 K/uL (ref 0.0–0.5)
Eosinophils Relative: 0 %
HCT: 38 % (ref 36.0–46.0)
Hemoglobin: 12.5 g/dL (ref 12.0–15.0)
Immature Granulocytes: 1 %
Lymphocytes Relative: 17 %
Lymphs Abs: 1.8 K/uL (ref 0.7–4.0)
MCH: 27.8 pg (ref 26.0–34.0)
MCHC: 32.9 g/dL (ref 30.0–36.0)
MCV: 84.6 fL (ref 80.0–100.0)
Monocytes Absolute: 0.4 K/uL (ref 0.1–1.0)
Monocytes Relative: 4 %
Neutro Abs: 8.2 K/uL — ABNORMAL HIGH (ref 1.7–7.7)
Neutrophils Relative %: 78 %
Platelets: 278 K/uL (ref 150–400)
RBC: 4.49 MIL/uL (ref 3.87–5.11)
RDW: 12.5 % (ref 11.5–15.5)
WBC: 10.4 K/uL (ref 4.0–10.5)
nRBC: 0 % (ref 0.0–0.2)

## 2024-04-07 LAB — COMPREHENSIVE METABOLIC PANEL WITH GFR
ALT: 15 U/L (ref 0–44)
AST: 22 U/L (ref 15–41)
Albumin: 4.3 g/dL (ref 3.5–5.0)
Alkaline Phosphatase: 119 U/L (ref 38–126)
Anion gap: 16 — ABNORMAL HIGH (ref 5–15)
BUN: 8 mg/dL (ref 6–20)
CO2: 21 mmol/L — ABNORMAL LOW (ref 22–32)
Calcium: 10.1 mg/dL (ref 8.9–10.3)
Chloride: 105 mmol/L (ref 98–111)
Creatinine, Ser: 0.76 mg/dL (ref 0.44–1.00)
GFR, Estimated: >60 mL/min (ref >60)
Glucose, Bld: 135 mg/dL — ABNORMAL HIGH (ref 70–99)
Potassium: 3.9 mmol/L (ref 3.5–5.1)
Sodium: 142 mmol/L (ref 135–145)
Total Bilirubin: 0.6 mg/dL (ref 0.0–1.2)
Total Protein: 7.8 g/dL (ref 6.5–8.1)

## 2024-04-07 LAB — I-STAT CHEM 8, ED
BUN: 7 mg/dL (ref 6–20)
Calcium, Ion: 1.26 mmol/L (ref 1.15–1.40)
Chloride: 105 mmol/L (ref 98–111)
Creatinine, Ser: 0.9 mg/dL (ref 0.44–1.00)
Glucose, Bld: 122 mg/dL — ABNORMAL HIGH (ref 70–99)
HCT: 39 % (ref 36.0–46.0)
Hemoglobin: 13.3 g/dL (ref 12.0–15.0)
Potassium: 3.5 mmol/L (ref 3.5–5.1)
Sodium: 140 mmol/L (ref 135–145)
TCO2: 23 mmol/L (ref 22–32)

## 2024-04-07 LAB — PROTIME-INR
INR: 1 (ref 0.8–1.2)
Prothrombin Time: 13.4 s (ref 11.4–15.2)

## 2024-04-07 LAB — APTT: aPTT: <22 s — ABNORMAL LOW (ref 24–36)

## 2024-04-07 LAB — HIV ANTIBODY (ROUTINE TESTING W REFLEX): HIV Screen 4th Generation wRfx: NONREACTIVE

## 2024-04-07 LAB — ETHANOL: Alcohol, Ethyl (B): <15 mg/dL (ref <15)

## 2024-04-07 MED ORDER — ACETAMINOPHEN 650 MG RE SUPP: 650.0000 mg | RECTAL | Status: DC | PRN

## 2024-04-07 MED ORDER — ONDANSETRON HCL 4 MG/2ML IJ SOLN: 4.0000 mg | Freq: Four times a day (QID) | INTRAMUSCULAR | Status: DC | PRN

## 2024-04-07 MED ORDER — ASPIRIN 81 MG PO TBEC
81.0000 mg | DELAYED_RELEASE_TABLET | Freq: Every day | ORAL | Status: DC
  Administered 2024-04-07 – 2024-04-08 (×2): 81 mg via ORAL
  Filled 2024-04-07 (×2): qty 1

## 2024-04-07 MED ORDER — LORAZEPAM 2 MG/ML IJ SOLN: 2.0000 mg | INTRAMUSCULAR | Status: DC | PRN

## 2024-04-07 MED ORDER — LEVETIRACETAM (KEPPRA) 500 MG/5 ML ADULT IV PUSH
750.0000 mg | Freq: Two times a day (BID) | INTRAVENOUS | Status: DC
  Administered 2024-04-07 – 2024-04-08 (×2): 750 mg via INTRAVENOUS
  Filled 2024-04-07 (×3): qty 10

## 2024-04-07 MED ORDER — HEPARIN SODIUM (PORCINE) 5000 UNIT/ML IJ SOLN
5000.0000 [IU] | Freq: Three times a day (TID) | INTRAMUSCULAR | Status: DC
  Administered 2024-04-07 – 2024-04-08 (×3): 5000 [IU] via SUBCUTANEOUS
  Filled 2024-04-07 (×5): qty 1

## 2024-04-07 MED ORDER — LEVETIRACETAM (KEPPRA) 500 MG/5 ML ADULT IV PUSH
40.0000 mg/kg | Freq: Once | INTRAVENOUS | Status: AC
  Administered 2024-04-07: 3000 mg via INTRAVENOUS
  Filled 2024-04-07: qty 30

## 2024-04-07 MED ORDER — SENNOSIDES-DOCUSATE SODIUM 8.6-50 MG PO TABS: 1.0000 | ORAL_TABLET | Freq: Every evening | ORAL | Status: DC | PRN

## 2024-04-07 MED ORDER — ACETAMINOPHEN 325 MG PO TABS: 650.0000 mg | ORAL_TABLET | ORAL | Status: DC | PRN

## 2024-04-07 MED ORDER — ONDANSETRON HCL 4 MG PO TABS: 4.0000 mg | ORAL_TABLET | Freq: Four times a day (QID) | ORAL | Status: DC | PRN

## 2024-04-07 MED ORDER — KETOROLAC TROMETHAMINE 15 MG/ML IJ SOLN
7.5000 mg | Freq: Once | INTRAMUSCULAR | Status: AC
  Administered 2024-04-07: 7.5 mg via INTRAVENOUS
  Filled 2024-04-07: qty 1

## 2024-04-07 NOTE — ED Notes (Signed)
 Patient transported to MRI

## 2024-04-07 NOTE — Progress Notes (Signed)
 PT Note  Patient Details Name: April Reilly MRN: [id number] DOB: 1970-12-16   Cancelled Treatment:    Reason Eval/Treat Not Completed: Other (comment). Pt to transfer to [facility] for MRI. PT to continue to follow acutely.   [place], PT Acute Rehab   [place] VEAR Drone 04/07/2024, 9:32 AM

## 2024-04-07 NOTE — Hospital Course (Signed)
 April Reilly is a 53 y.o. 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

## 2024-04-07 NOTE — ED Notes (Signed)
 Patient states she is tired of taking her medications.  Family reports that patient did take Keppra  at 9PM today bu

## 2024-04-07 NOTE — ED Notes (Signed)
Patient sleeping, no sign of distress

## 2024-04-07 NOTE — Progress Notes (Signed)
  PROGRESS NOTE    April Reilly  FMW:[id number] DOB: Nov 23, 1970 DOA: 04/06/2024 PCP: Paseda, Folashade R, FNP   Same day note:   Patient seen and examined at the bedside. Daughter was also present. We discussed the results of the MRI brain and she is happy that she didn't have a stroke. She does follow up with a neurologist. She would like to stay on Keppra  on discharge. She was on lamictal  at home. She did bit her tongue and complaining of pain there.    [name], MD [facility]  If 7PM-7AM, please contact night-coverage  04/07/2024, 3:30 PM

## 2024-04-07 NOTE — H&P (Signed)
 History and Physical    April Reilly FMW:[id number] DOB: Jan 22, 1971 DOA: 04/06/2024  PCP: Paseda, Folashade R, FNP  Patient coming from: Home  I have personally briefly reviewed patient's old medical records in [facility]  Chief Complaint: Seizure  HPI: April Reilly is a 53 y.o. 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  Patient states that around 3:30 PM on 9/16 she had a fall.  She says prior to the fall she had an out of body experience and some slurred speech.  She says she fell to the ground with loss of consciousness.  She says she woke up in a puddle of her own urine and she had a bite to her left tongue.  She suspects that she had a seizure.  This was an unwitnessed event.  She admits that she has not been taking her Lamictal  regularly but did take it in the morning.    She also notes new weakness/heaviness in her left leg she states normally that she ambulates on her own and does not have any focal weakness at baseline.  ED Course  Labs/Imaging on admission: I have personally reviewed following labs and imaging studies.  Initial vitals showed BP 148/92, pulse 95, RR 16, temp 98.8 F, SpO2 94% on room air.  Labs showed sodium 142, potassium 3.9, bicarb 21, BUN 8, creatinine 0.76, serum glucose 135, LFTs within normal limits,WBC 10.4, hemoglobin 12.5, platelets 278, serum ethanol <15.  CT head without contrast negative for acute intracranial abnormality.  Chronic right temporal/subdural encephalomalacia noted.  Teleneurology consulted.  They recommended loading with IV Keppra  40 mg/kg now and checking Lamictal  level.  Patient was given IV Keppra  3 g.  The hospitalist service was consulted for admission.  Review of Systems: All systems reviewed and are negative except as documented in  history of present illness above.   Past Medical History:  Diagnosis Date   Asthma    Stroke [facility])    Vaginal bleeding, abnormal    for 4 months    Past Surgical History:  Procedure Laterality Date   FOOT SURGERY     TUBAL LIGATION      Social History: Social History   Tobacco Use   Smoking status: Never   Smokeless tobacco: Never  Substance Use Topics   Alcohol use: Not Currently   Drug use: Never    Allergies  Allergen Reactions   Megestrol  Other (See Comments) and Swelling    Caused blood clot in brain  Caused blood clot in brain    Caused blood clot in brain Caused blood clot in brain   Qulipta  [Atogepant ] Anxiety    HALLUCINATION    Family History  Problem Relation Age of Onset   Clotting disorder Mother      Prior to Admission medications   Medication Sig Start Date End Date Taking? Authorizing Provider  aspirin  EC 81 MG tablet Take 1 tablet (81 mg total) by mouth daily. Swallow whole. 01/09/24 01/03/25 Yes Gregg Lek, MD  albuterol  (VENTOLIN  HFA) 108 (90 Base) MCG/ACT inhaler Inhale 2 puffs into the lungs every 6 (six) hours as needed for wheezing or shortness of breath. 01/09/24   Gregg Lek, MD  cyclobenzaprine  (FLEXERIL ) 10 MG tablet Take 1 tablet (10 mg total) by mouth 3 (three) times daily as needed for muscle spasms. 01/09/24   Camara, Amadou, MD  gabapentin  (NEURONTIN ) 300 MG capsule Take 1 capsule (300 mg total) by mouth at bedtime. 01/09/24   Camara, Amadou, MD  ibuprofen  (ADVIL ) 800 MG tablet Take 1 tablet (800 mg total) by mouth every 8 (eight) hours as needed for headache. 01/09/24   Gregg Lek, MD  LamoTRIgine  200 MG TB24 24 hour tablet Take 1 tablet (200 mg total) by mouth daily. 01/09/24   Camara, Amadou, MD  PARoxetine  (PAXIL ) 20 MG tablet Take 1 tablet (20 mg total) by mouth daily. 01/09/24   Camara, Amadou, MD  propranolol  ER (INDERAL  LA) 60 MG 24 hr capsule Take 1 capsule (60 mg total) by mouth daily. 01/09/24   Gregg Lek, MD     Physical Exam: Vitals:   04/06/24 2302  BP: (!) 148/92  Pulse: 95  Resp: 16  Temp: 98.8 F (37.1 C)  TempSrc: Oral  SpO2: 94%   Constitutional: Resting in bed, NAD, calm, comfortable Eyes: EOMI, lids and conjunctivae normal ENMT: Mucous membranes are moist. Posterior pharynx clear of any exudate or lesions.Normal dentition.  Neck: normal, supple, no masses. Respiratory: clear to auscultation bilaterally, no wheezing, no crackles. Normal respiratory effort. No accessory muscle use.  Cardiovascular: Regular rate and rhythm, no murmurs / rubs / gallops. No extremity edema. 2+ pedal pulses. Abdomen: no tenderness, no masses palpated. Musculoskeletal: no clubbing / cyanosis. No joint deformity upper and lower extremities. Good ROM, no contractures. Normal muscle tone.  Skin: no rashes, lesions, ulcers. No induration Neurologic: Sensation intact. Strength 3/5 LLE otherwise 5/5 in other extremities. Psychiatric: Normal judgment and insight. Alert and oriented x 3. Normal mood.   EKG: Personally reviewed.  Ordered and pending.  Assessment/Plan Principal Problem:   Seizure (HCC) Active Problems:   History of intracerebral hemorrhage without residual deficit   April Reilly is a 53 y.o. 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  Assessment and Plan: Seizure: Patient presenting with suspected unwitnessed seizure.  She admits to inconsistent use of her Lamictal .  She also reports new left lower extremity weakness.  Teleneurology recommended IV Keppra  and awaiting Lamictal  level before restarting.  CT head negative for acute changes, chronic right temporal/supra encephalomalacia noted. 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  Continue aspirin  81 mg daily.   DVT prophylaxis: heparin  injection 5,000 Units Start: 04/07/24 0600 Code Status: Full code Family Communication: Sister at bedside Disposition Plan: Home, dispo pending clinical progress Consults called: Neurology Severity of Illness: The appropriate patient status for this patient is INPATIENT. Inpatient status is judged to be reasonable and necessary in order to provide the required intensity of service to ensure the patient's safety. The patient's presenting symptoms, physical exam findings, and initial radiographic and laboratory data in the context of their chronic comorbidities is felt to place them at high risk for further clinical deterioration. Furthermore, it is not anticipated that the patient will be medically stable for discharge from the hospital within 2 midnights of admission.   * I certify that at the point of admission it is my clinical judgment that the patient will require inpatient hospital care spanning beyond 2 midnights from the  point of admission due to high intensity of service, high risk for further deterioration and high frequency of surveillance required.DEWAINE Jorie Blanch MD [facility]  If 7PM-7AM, please contact night-coverage [email or web address]  04/07/2024, 1:32 AM

## 2024-04-07 NOTE — Consult Note (Signed)
 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 Unclear semiology of event due to it being unwitnessed, and while etiology likely secondary to medication noncompliance, the fact that patient is potentially noncompliant on lamictal  complicated her clinical picture, as a very low level (of greater than 5 days off this medication) would require patient to be again slowly titrated to her target dose over a 9-10 weeks period to avoid a severe complication (such as Mannie Louder Syndrome). For now, recommend empirically loading with keppra  and observation as well as obtaining lamotrigine  serum levels; if level is therapeutic or near therapeutic (between 3-15 uG/mL), ok to safely restart home lamictal  with slight increase in dose to 250mg  qAM/200mg  qPM or per inpatient Neurology team's recommendations; however, if level is less than 2ug/mL, would need to discontinue lamictal  altogether and restart keppra  empirically, with close outpatient Neurology follow up to discuss repeat medication titration or alternative medication, if appropriate.  Our recommendations are outlined below.  Recommendations:        Stroke/Telemetry Floor       Neuro Checks (Q4)       Bedside Swallow Eval       DVT  Prophylaxis       IV Fluids, Normal Saline       Head of Bed 30 Degrees       Euglycemia and Avoid Hyperthermia (PRN Acetaminophen )       Admit for observation       Keppra  40mg /kg load now, then start keppra  750mg  twice daily       Hold lamictal  for now; please obtain lamotrigine  serum concentration.       If lamictal  level is therapeutic or near therapeutic (between 3-15 uG/mL), ok to safely restart home lamictal  with slight increase in dose to 250mg  qAM/200mg  qPM or per inpatient Neurology team's recommendations; however, if level is less than 2ug/mL, would need to discontinue lamictal  altogether and continue keppra  empirically, at dose of 750mg  twice daily (appx 10mg /kg/dose twice daily)       Infectious and metabolic workup per primary team  Sign Out:       Discussed with Emergency Department Provider    ------------------------------------------------------------------------------  Advanced Imaging: Advanced Imaging Deferred because:  Stroke not suspected with clinical presentation and exam   Metrics: Last Known Well: 04/06/2024 15:30:00 Dispatch Time: 04/06/2024 23:31:05 Arrival Time: 04/06/2024 22:44:00 Initial Response Time: 04/06/2024 23:31:50 Symptoms: Breakthrough seizure. Initial patient interaction: 04/06/2024 23:34:30 NIHSS Assessment Completed: 04/06/2024 23:40:20 Patient is not a candidate for Thrombolytic. Thrombolytic Medical Decision: 04/06/2024 23:40:21 Patient was not deemed candidate for Thrombolytic because of following reasons: LKW outside 4.5 hr window. . History of previous intracranial hemorrhage, intracranial neoplasm . Weight Noted by Staff: 88 kg  CT Head: I personally reviewed all the CT images that were available to me and it showed: a large region of  encephalomalacia in the R posterior frontal-temporal-parietal-occipital area consistent with a chronic R distal MCA stroke  Primary Provider Notified of Diagnostic Impression and Management Plan  on: 04/07/2024 00:51:06    ------------------------------------------------------------------------------  History of Present Illness: Patient is a 53 year old Female.  Patient was brought by private transportation with symptoms of Breakthrough seizure. 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 Per patient, symptom onset at 1530 on 9/16; patient states that she was on phone when she started slurring her words, after which point she had a brief episode of out of body  sensation followed by loss of consciousness. Patient unsure of duration of loss of consciousness, yet notes that on waking, she did so in a pool of her own urine, and had bitten the L side of her tongue. Per staff and per family, high suspicion for multiple missed medication doses of patient's antiseizure medication (on Lamictal  200mg  twice daily).   Past Medical History:      Stroke      Seizures      There is no history of Atrial Fibrillation      There is no history of Dementia/MCI  Medications:  No Anticoagulant use  Antiplatelet use: Yes [id number] Reviewed EMR for current medications  Allergies:  Reviewed  Social History: Drug Use: No  Family History:  There is no family history of premature cerebrovascular disease pertinent to this consultation  ROS : 14 Points Review of Systems was performed and was negative except mentioned in HPI.  Past Surgical History: There Is No Surgical History Contributory To Today's Visit    Examination: BP(148/92), Pulse(95), 1A: Level of Consciousness - Alert; keenly responsive + 0 1B: Ask Month and Age - Both Questions Right + 0 1C: Blink Eyes & Squeeze Hands -  Performs Both Tasks + 0 2: Test Horizontal Extraocular Movements - Normal + 0 3: Test Visual Fields - No Visual Loss + 0 4: Test Facial Palsy (Use Grimace if Obtunded) - Normal symmetry + 0 5A: Test Left Arm Motor Drift - No Drift for 10 Seconds + 0 5B: Test Right Arm Motor Drift - No Drift for 10 Seconds + 0 6A: Test Left Leg Motor Drift - Drift, but doesn't hit bed + 1 6B: Test Right Leg Motor Drift - No Drift for 5 Seconds + 0 7: Test Limb Ataxia (FNF/Heel-Shin) - No Ataxia + 0 8: Test Sensation - Normal; No sensory loss + 0 9: Test Language/Aphasia - Normal; No aphasia + 0 10: Test Dysarthria - Normal + 0 11: Test Extinction/Inattention - No abnormality + 0  NIHSS Score: 1   Pre-Morbid Modified Rankin Scale: 1 Points = No significant disability despite symptoms; able to carry out all usual duties and activities  Spoke with : Dr Theadore I reviewed the available imaging via Rapid and initiated discussion with the primary provider  This consult was conducted in real time using interactive audio and video technology. Patient was informed of the technology being used for this visit and agreed to proceed. Patient located in hospital and provider located at home/office setting.   Patient is being evaluated for possible acute neurologic impairment and high probability of imminent or life-threatening deterioration. I spent total of 25 minutes  providing care to this patient, including time for face to face visit via telemedicine, review of medical records, imaging studies and discussion of findings with providers, the patient and/or family.    Dr Alonna Sanders   [facility] For Inpatient follow-up with [facility] physician please call [facility] at [phone]. As we are not an outpatient service for any post hospital discharge needs please contact the hospital for assistance. If you have any questions for the [facility] physicians or need to reconsult for clinical or diagnostic  changes please contact us  via [facility] at [phone].   Signature : Alonna Sanders

## 2024-04-07 NOTE — ED Notes (Signed)
Patient ambulated to restroom unassisted without difficulty

## 2024-04-07 NOTE — ED Notes (Signed)
 Patient transported to CT

## 2024-04-07 NOTE — ED Notes (Signed)
 Labs delayed due hard stick, Sickle cell patient requiring ultrasound IV

## 2024-04-07 NOTE — ED Provider Notes (Signed)
 [facility]-EMERGENCY DEPT [facility] Emergency Department Provider Note MRN:  [id number]  Arrival date & time: 04/07/24     Chief Complaint   Weakness   History of Present Illness   April Reilly is a 53 y.o. year-old female with a history of stroke, seizure presenting to the ED with chief complaint of weakness.  Patient explains that she had an out of body experience followed by migraine, followed by malaise fatigue today with trouble speaking.  Code stroke initiated in triage.  Review of Systems  A thorough review of systems was obtained and all systems are negative except as noted in the HPI and PMH.   Patient's Health History    Past Medical History:  Diagnosis Date   Asthma    Stroke [facility])    Vaginal bleeding, abnormal    for 4 months    Past Surgical History:  Procedure Laterality Date   FOOT SURGERY     TUBAL LIGATION      Family History  Problem Relation Age of Onset   Clotting disorder Mother     Social History   Socioeconomic History   Marital status: Single    Spouse name: Not on file   Number of children: 4   Years of education: Not on file   Highest education level: Not on file  Occupational History   Not on file  Tobacco Use   Smoking status: Never   Smokeless tobacco: Never  Substance and Sexual Activity   Alcohol use: Not Currently   Drug use: Never   Sexual activity: Not Currently  Other Topics Concern   Not on file  Social History Narrative      Right handed   Caffeine  ocassional   Lives alone with dog   Social Drivers of Health   Financial Resource Strain: Not on file  Food Insecurity: No Food Insecurity (01/01/2023)   Received from [employer] Insecurity    Within the past 12 months, you worried that your food would run out before you got the money to buy more.: Never true    Within the past 12 months, the food you bought just didn't last and you didn't have money to get more.: Never true  Transportation Needs: No  Transportation Needs (01/01/2023)   Received from [employer] - Transportation    Lack of Transportation (Medical): No    Lack of Transportation (Non-Medical): No  Physical Activity: Not on file  Stress: Low Risk (02/27/2020)   Received from [facility]   Stress    Stress Risk: Not on file    General Anxiety Disorder (GAD-7) Score: Not on file  Social Connections: Not on file  Intimate Partner Violence: Not At Risk (01/01/2023)   Received from [facility]   Intimate Partner Violence    In the past 12 months, has the patient been fearful of being subjected to or has experienced physical/emotional abuse from an intimate partner, other family member , friend or domestic partner?: No     Physical Exam   Vitals:   04/07/24 0300 04/07/24 0525  BP: 101/70 (!) 113/93  Pulse: 85 75  Resp: 16 16  Temp:  97.7 F (36.5 C)  SpO2: 96% 99%    CONSTITUTIONAL: Well-appearing, NAD NEURO/PSYCH:  Alert and oriented x 3, no focal deficits EYES:  eyes equal and reactive ENT/NECK:  no LAD, no JVD CARDIO: Regular rate, well-perfused, normal S1 and S2 PULM:  CTAB no wheezing or rhonchi GI/GU:  non-distended, non-tender MSK/SPINE:  No gross deformities, no edema SKIN:  no rash, atraumatic   *Additional and/or pertinent findings included in MDM below  Diagnostic and Interventional Summary    EKG Interpretation Date/Time:  Wednesday April 07 2024 01:35:43 EDT Ventricular Rate:  71 PR Interval:  126 QRS Duration:  88 QT Interval:  401 QTC Calculation: 436 R Axis:   11  Text Interpretation: Sinus rhythm Normal ECG When compared with ECG of 09/10/2021, No significant change was found Confirmed by Raford Lenis ([id number]) on 04/07/2024 3:58:37 AM       Labs Reviewed  APTT - Abnormal; Notable for the following components:      Result Value   aPTT <22 (*)    All other components within normal limits  COMPREHENSIVE METABOLIC PANEL WITH GFR - Abnormal; Notable for the  following components:   CO2 21 (*)    Glucose, Bld 135 (*)    Anion gap 16 (*)    All other components within normal limits  CBC WITH DIFFERENTIAL/PLATELET - Abnormal; Notable for the following components:   Neutro Abs 8.2 (*)    All other components within normal limits  I-STAT CHEM 8, ED - Abnormal; Notable for the following components:   Glucose, Bld 122 (*)    All other components within normal limits  CBG MONITORING, ED - Abnormal; Notable for the following components:   Glucose-Capillary 131 (*)    All other components within normal limits  PROTIME-INR  ETHANOL  LAMOTRIGINE  LEVEL  HIV ANTIBODY (ROUTINE TESTING W REFLEX)    CT HEAD CODE STROKE WO CONTRAST  Final Result    MR BRAIN WO CONTRAST    (Results Pending)    Medications  heparin  injection 5,000 Units (5,000 Units Subcutaneous Given 04/07/24 0551)  LORazepam  (ATIVAN ) injection 2 mg (has no administration in time range)  levETIRAcetam  (KEPPRA ) undiluted injection 750 mg (has no administration in time range)  acetaminophen  (TYLENOL ) tablet 650 mg (has no administration in time range)    Or  acetaminophen  (TYLENOL ) suppository 650 mg (has no administration in time range)  senna-docusate (Senokot-S) tablet 1 tablet (has no administration in time range)  ondansetron  (ZOFRAN ) tablet 4 mg (has no administration in time range)    Or  ondansetron  (ZOFRAN ) injection 4 mg (has no administration in time range)  aspirin  EC tablet 81 mg (has no administration in time range)  sodium chloride  flush (NS) 0.9 % injection 3 mL (3 mLs Intravenous Given 04/07/24 0114)  levETIRAcetam  (KEPPRA ) undiluted injection 3,000 mg (3,000 mg Intravenous Given 04/07/24 0113)     Procedures  /  Critical Care .Critical Care  Performed by: Theadore Ozell HERO, MD Authorized by: Theadore Ozell HERO, MD   Critical care provider statement:    Critical care time (minutes):  35   Critical care was necessary to treat or prevent imminent or life-threatening  deterioration of the following conditions:  CNS failure or compromise   Critical care was time spent personally by me on the following activities:  Development of treatment plan with patient or surrogate, discussions with consultants, evaluation of patient's response to treatment, examination of patient, ordering and review of laboratory studies, ordering and review of radiographic studies, ordering and performing treatments and interventions, pulse oximetry, re-evaluation of patient's condition and review of old charts   ED Course and Medical Decision Making  Initial Impression and Ddx Differential diagnosis includes seizure, stroke, LVO, complex migraine.  Past medical/surgical history that increases complexity of ED encounter: History of stroke,  seizure  Interpretation of Diagnostics I personally reviewed the Laboratory Testing and my interpretation is as follows: No significant blood count or electrolyte disturbance.  CT head unremarkable  Patient Reassessment and Ultimate Disposition/Management     Telene[facility]rology recommending admission for observation.  Presentation favored to be related to seizure.  Admitted to medicine.  Patient management required discussion with the following services or consulting groups:  Hospitalist Service  Complexity of Problems Addressed Acute illness or injury that poses threat of life of bodily function  Additional Data Reviewed and Analyzed Further history obtained from: Further history from spouse/family member  Additional Factors Impacting ED Encounter Risk Consideration of hospitalization  Ozell HERO. Theadore, MD [facility] Emergency Medicine [facility] [email or web address]  Final Clinical Impressions(s) / ED Diagnoses  No diagnosis found.  ED Discharge Orders     None        Discharge Instructions Discussed with and Provided to Patient:   Discharge Instructions   None      Theadore Ozell HERO, MD 04/07/24 [phone]

## 2024-04-07 NOTE — Progress Notes (Signed)
 Brief same day note:  Patient is a 46 female with history of right hemispheric ICH, cerebral venous sinus thrombosis, seizure disorder, sickle cell trait, depression, anxiety who presented from home for further evaluation of suspected seizure.  Also reported to have a fall, slurred speech, new weakness/heaviness on the left lower extremity.  On presentation she was mildly hypertensive otherwise hemodynamically stable.  CT head /MRI brian did not show any acute findings, showed chronic right temporal/subdural encephalomalacia.  Patient states she had not been taking Lamictal  for last 2 months. Neurology consulted.  As per neurology recommendation given loading dose of IV Keppra .  Patient being transferred to [facility] for neurological evaluation.  Patient seen and examined at bedside today.  She was hemodynamically stable.  Very comfortable, alert oriented, no focal deficits, speech was clear.  No new complaints.  Assessment and plan:  Seizure disorder: Takes Lamictal  at home.  Report of being noncompliant.  Did not take for last 2 months.  Suspected seizure episode at home along with new left lower extremity weakness.  Given a loading dose of Keppra . Lamictal  level pending.  Neurology already consulted and following.  Continue IV Keppra  for now.  MRI of the brain did not show acute findings.  Continue seizure precaution, frequent neurochecks  History of large right-sided hemispheric ICH/cerebral venous  sinus thrombosis: On aspirin  81 mg daily.  No residual deficits .  Ambulates well  Discussed management plan with the patient and her daughter at bedside

## 2024-04-08 ENCOUNTER — Encounter: Payer: Self-pay | Admitting: Neurology

## 2024-04-08 ENCOUNTER — Other Ambulatory Visit (HOSPITAL_COMMUNITY): Payer: Self-pay

## 2024-04-08 DIAGNOSIS — G40909 Epilepsy, unspecified, not intractable, without status epilepticus: Secondary | ICD-10-CM | POA: Diagnosis not present

## 2024-04-08 DIAGNOSIS — T426X6A Underdosing of other antiepileptic and sedative-hypnotic drugs, initial encounter: Secondary | ICD-10-CM

## 2024-04-08 DIAGNOSIS — R569 Unspecified convulsions: Secondary | ICD-10-CM | POA: Diagnosis not present

## 2024-04-08 DIAGNOSIS — Z91148 Patient's other noncompliance with medication regimen for other reason: Secondary | ICD-10-CM

## 2024-04-08 LAB — LAMOTRIGINE LEVEL: Lamotrigine Lvl: <1 ug/mL — ABNORMAL LOW (ref 2.0–20.0)

## 2024-04-08 MED ORDER — LEVETIRACETAM 750 MG PO TABS
750.0000 mg | ORAL_TABLET | Freq: Two times a day (BID) | ORAL | 0 refills | Status: DC
  Filled 2024-04-08: qty 60, 30d supply, fill #0

## 2024-04-08 MED ORDER — PANTOPRAZOLE SODIUM 40 MG PO TBEC
40.0000 mg | DELAYED_RELEASE_TABLET | Freq: Every day | ORAL | 0 refills | Status: DC
  Filled 2024-04-08 – 2024-04-12 (×2): qty 30, 30d supply, fill #0

## 2024-04-08 NOTE — Discharge Instructions (Addendum)
 Do not drive, operating heavy machinery, perform activities at heights, swimming or participation in water  activities or provide baby sitting services, due to  siezures until you have seen by Primary MD or a Neurologist and advised to do so again.   Seizure precautions: Per [place]  DMV statutes, patients with seizures are not allowed to drive until they have been seizure-free for six months and cleared by a physician    Use caution when using heavy equipment or power tools. Avoid working on ladders or at heights. Take showers instead of baths. Ensure the water  temperature is not too high on the home water  heater. Do not go swimming alone. Do not lock yourself in a room alone (i.e. bathroom). When caring for infants or small children, sit down when holding, feeding, or changing them to minimize risk of injury to the child in the event you have a seizure. Maintain good sleep hygiene. Avoid alcohol.    If patient has another seizure, call 911 and bring them back to the ED if: A.  The seizure lasts longer than 5 minutes.      B.  The patient doesn't wake shortly after the seizure or has new problems such as difficulty seeing, speaking or moving following the seizure C.  The patient was injured during the seizure D.  The patient has a temperature over 102 F (39C) E.  The patient vomited during the seizure and now is having trouble breathing    During the Seizure   - First, ensure adequate ventilation and place patients on the floor on their left side  Loosen clothing around the neck and ensure the airway is patent. If the patient is clenching the teeth, do not force the mouth open with any object as this can cause severe damage - Remove all items from the surrounding that can be hazardous. The patient may be oblivious to what's happening and may not even know what he or she is doing. If the patient is confused and wandering, either gently guide him/her away and block access to outside areas -  Reassure the individual and be comforting - Call 911. In most cases, the seizure ends before EMS arrives. However, there are cases when seizures may last over 3 to 5 minutes. Or the individual may have developed breathing difficulties or severe injuries. If a pregnant patient or a person with diabetes develops a seizure, it is prudent to call an ambulance.      After the Seizure (Postictal Stage)   After a seizure, most patients experience confusion, fatigue, muscle pain and/or a headache. Thus, one should permit the individual to sleep. For the next few days, reassurance is essential. Being calm and helping reorient the person is also of importance.   Most seizures are painless and end spontaneously. Seizures are not harmful to others but can lead to complications such as stress on the lungs, brain and the heart. Individuals with prior lung problems may develop labored breathing and respiratory distress.

## 2024-04-08 NOTE — Progress Notes (Addendum)
 Neurology Consultation Reason for Consult: Seizure Referring Physician: Dr Brayton Lye  CC: Seizure  History is obtained from: Patient, chart review  HPI: April Reilly is a 53 y.o. 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  Reportedly patient patient was on phone when she had slurred speech followed by brief episode of out of body sensation followed by loss of consciousness.  She did bite her tongue on the left side.  States she stopped taking her lamotrigine  because she was tired of taking it.  And had not taken it in almost 2 months.  Prior antiseizure medication: Keppra   ROS: negative except above   Past Medical History:  Diagnosis Date   Asthma    Stroke [facility])    Vaginal bleeding, abnormal    for 4 months    Family History  Problem Relation Age of Onset   Clotting disorder Mother      Social History:  reports that she has never smoked. She has never used smokeless tobacco. She reports that she does not currently use alcohol. She reports that she does not use drugs.  Examination  Vital signs in last 24 hours: Temp:  [98.4 F (36.9 C)] 98.4 F (36.9 C) (09/17 1300) Pulse Rate:  [68-87] 79 (09/18 0733) Resp:  [14-20] 15 (09/18 0733) BP: (103-115)/(63-82) 110/70 (09/18 0732) SpO2:  [97 %-100 %] 100 % (09/18 0733) Weight:  [76.2 kg] 76.2 kg (09/17 1428)  General: Sitting in bed, not in apparent distress Neuro: MS: Alert, oriented, follows commands CN: pupils equal and reactive,  EOMI, face symmetric, tongue midline, normal sensation over face, Motor: 5/5 strength in all 4 extremities Coordination: normal Gait: not tested  Basic Metabolic Panel: Recent Labs  Lab 04/06/24 2327 04/07/24 0111  NA 142 140  K 3.9 3.5  CL 105 105  CO2 21*  --   GLUCOSE 135* 122*  BUN 8 7  CREATININE 0.76 0.90  CALCIUM  10.1  --      CBC: Recent Labs  Lab 04/07/24 0019 04/07/24 0111  WBC 10.4  --   NEUTROABS 8.2*  --   HGB 12.5 13.3  HCT 38.0 39.0  MCV 84.6  --   PLT 278  --      Coagulation Studies: Recent Labs    04/06/24 05-06-2326  LABPROT 13.4  INR 1.0    Imaging personally reviewed  MRI brain without contrast 04/07/2024: No acute intracranial abnormality. Moderate to large area of chronic encephalomalacia with hemosiderin in the right hemisphere, mostly affecting Right PCA territory and corresponding to the 06-May-2022 infarct.  ASSESSMENT AND PLAN: 53 year old female with history of epilepsy who came in with breakthrough seizure in the setting of not taking her lamotrigine  for 2 months.   Epilepsy with breakthrough seizure due to medication (lamotrigine ) noncompliance -Patient states she was tired of taking the medication and therefore stopped taking it.  However now states she knows what happens if she does not take her medicine and therefore is willing to continue taking antiseizure medications - I discussed that because she has not taken lamotrigine  for months, we will need to start her at a low dose and gradually increase again.  She states she does not want to be on lamotrigine  and would prefer to stay on Keppra  - Therefore recommend continuing Keppra  750 mg twice daily - Discussed seizure precautions including no driving - Discussed plan with Dr. Lye via secure chat  Seizure precautions: Per Between  [place] statutes, patients with seizures are not allowed to drive until they have been seizure-free for six months and cleared by a physician    Use caution when using heavy equipment or power tools. Avoid working on ladders or at heights. Take showers instead of baths. Ensure the water  temperature is not too high on the home water  heater. Do not go swimming alone. Do not lock yourself in a room alone (i.e. bathroom). When caring for infants or small children, sit down when holding, feeding, or  changing them to minimize risk of injury to the child in the event you have a seizure. Maintain good sleep hygiene. Avoid alcohol.    If patient has another seizure, call 911 and bring them back to the ED if: A.  The seizure lasts longer than 5 minutes.      B.  The patient doesn't wake shortly after the seizure or has new problems such as difficulty seeing, speaking or moving following the seizure C.  The patient was injured during the seizure D.  The patient has a temperature over 102 F (39C) E.  The patient vomited during the seizure and now is having trouble breathing    During the Seizure   - First, ensure adequate ventilation and place patients on the floor on their left side  Loosen clothing around the neck and ensure the airway is patent. If the patient is clenching the teeth, do not force the mouth open with any object as this can cause severe damage - Remove all items from the surrounding that can be hazardous. The patient may be oblivious to what's happening and may not even know what he or she is doing. If the patient is confused and wandering, either gently guide him/her away and block access to outside areas - Reassure the individual and be comforting - Call 911. In most cases, the seizure ends before EMS arrives. However, there are cases when seizures may last over 3 to 5 minutes. Or the individual may have developed breathing difficulties or severe injuries. If a pregnant patient or a person with diabetes develops a seizure, it is prudent to call an ambulance.     After the Seizure (Postictal Stage)   After a seizure, most patients experience confusion, fatigue, muscle pain and/or a headache. Thus, one should permit the individual to sleep. For the next few days, reassurance is essential. Being calm and helping reorient the person is also of importance.   Most seizures are painless and end spontaneously. Seizures are not harmful to others but can lead to complications such as  stress on the lungs, brain and the heart. Individuals with prior lung problems may develop labored breathing and respiratory distress.       I personally spent a total of 38 minutes in the care of the patient today including getting/reviewing separately obtained history, performing a medically appropriate exam/evaluation, counseling and educating, placing orders, referring and communicating with other health care professionals, documenting clinical information in the EHR, independently interpreting results, and coordinating care.           Arlin Krebs [facility] For questions after 5pm please refer to AMION to reach the Neurologist on call

## 2024-04-08 NOTE — Telephone Encounter (Signed)
 Please add her to my schedule for next week. Thanks

## 2024-04-08 NOTE — Discharge Summary (Addendum)
 Physician Discharge Summary  Alixandra Alfieri FMW:[id number] DOB: Dec 06, 1970 DOA: 04/06/2024  PCP: Paseda, Folashade R, FNP  Admit date: 04/06/2024 Discharge date: 04/08/2024  Admitted From: (Home) Disposition:  (Home )  Recommendations for Outpatient Follow-up:  Follow up with PCP in 1-2 weeks Please obtain BMP/CBC in one week I have discussed seizures precaution and restriction for driving for 74-month and until she is seen by her neurologist   Diet recommendation:  Regular   Brief/Interim Summary: April Reilly is a 53 y.o. 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  She says she fell to the ground with loss of consciousness.  She says she woke up in a puddle of her own urine and she had a bite to her left tongue.  She suspects that she had a seizure.  This was an unwitnessed event.  She admits that she has not been taking her Lamictal  regularly but did take it in the morning.  As well she noted left leg heaviness which is new for her.  CT head without contrast negative for acute intracranial abnormality.  Chronic right temporal/subdural encephalomalacia noted.  MRI brain did not show any acute findings, only chronic left temporal/subdural encephalomalacia.  He was admitted and seen by neurology.   Seizures Todd's paralysis -Neurology input greatly appreciated, seizures most likely in the setting of her not being very compliant with her Lamictal , she she was started on IV Keppra , and after neurology discussed with the patient, they recommend discharging her on Keppra  750 mg oral twice daily. - I have discussed at length seizure precautions and restrictions on discharge. 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  Continue aspirin  81 mg daily.  Discharge Diagnoses:  Principal Problem:   Seizure [facility]) Active Problems:   History of intracerebral hemorrhage without residual deficit    Discharge Instructions  Discharge Instructions     Diet - low sodium heart healthy   Complete by: As directed    Increase activity slowly   Complete by: As directed       Allergies as of 04/08/2024       Reactions   Megestrol  Other (See Comments), Swelling   Caused blood clot in brain Caused blood clot in brain    Caused blood clot in brain Caused blood clot in brain   Qulipta  [atogepant ] Anxiety   HALLUCINATION        Medication List     STOP taking these medications    LamoTRIgine  200 MG Tb24 24 hour tablet       TAKE these medications    albuterol  108 (90 Base) MCG/ACT inhaler Commonly known as: VENTOLIN  HFA Inhale 2 puffs into the lungs every 6 (six) hours as needed for wheezing or shortness of breath.   aspirin  EC 81 MG tablet Take 1 tablet (81 mg total) by mouth daily. Swallow whole.   cyclobenzaprine  10 MG tablet Commonly known as: FLEXERIL  Take 1 tablet (10 mg total) by mouth 3 (three) times daily as needed for muscle spasms.   gabapentin  300 MG capsule Commonly known as: NEURONTIN  Take 1 capsule (300 mg total) by mouth at  bedtime.   ibuprofen  200 MG tablet Commonly known as: ADVIL  Take 200 mg by mouth every 6 (six) hours as needed. What changed: Another medication with the same name was removed. Continue taking this medication, and follow the directions you see here.   levETIRAcetam  750 MG tablet Commonly known as: Keppra  Take 1 tablet (750 mg total) by mouth 2 (two) times daily.   pantoprazole  40 MG tablet Commonly known as: Protonix  Take 1 tablet (40 mg total) by mouth daily.   PARoxetine  20 MG tablet Commonly  known as: PAXIL  Take 1 tablet (20 mg total) by mouth daily.   propranolol  ER 60 MG 24 hr capsule Commonly known as: INDERAL  LA Take 1 capsule (60 mg total) by mouth daily.        Allergies  Allergen Reactions   Megestrol  Other (See Comments) and Swelling    Caused blood clot in brain  Caused blood clot in brain    Caused blood clot in brain Caused blood clot in brain   Qulipta  [Atogepant ] Anxiety    HALLUCINATION    Consultations: Neurology   Procedures/Studies: MR BRAIN WO CONTRAST Result Date: 04/07/2024 CLINICAL DATA:  53 year old female code stroke presentation yesterday. Large right hemisphere infarct in 2023. EXAM: MRI HEAD WITHOUT CONTRAST TECHNIQUE: Multiplanar, multiecho pulse sequences of the brain and surrounding structures were obtained without intravenous contrast. COMPARISON:  Head CT yesterday.  Brain MRI 09/10/2021. FINDINGS: Brain: Moderate to large area of chronic encephalomalacia in the right hemisphere affecting right PCA and right PCA/MCA watershed areas with hemosiderin. Resolved intracranial mass effect seen in 2023. Ex vacuo enlargement of the right temporal and occipital horns. Background brain volume remains relatively normal for age. No superimposed restricted diffusion to suggest acute infarction. No midline shift, mass effect, evidence of mass lesion, ventriculomegaly, or acute intracranial hemorrhage. Cervicomedullary junction and pituitary are within normal limits. No other cortical encephalomalacia or chronic cerebral blood products identified. Deep gray nuclei relatively spared. Brainstem and cerebellum appear negative. And left hemisphere gray and white matter signal largely normal for age. Vascular: Major intracranial vascular flow voids are stable since 2023. Skull and upper cervical spine: Negative. Sinuses/Orbits: Negative. Other: Mastoids are clear.  Negative visible scalp and face. IMPRESSION: 1. No acute intracranial abnormality. 2. Moderate to  large area of chronic encephalomalacia with hemosiderin in the right hemisphere, mostly affecting Right PCA territory and corresponding to the 2023 infarct. Electronically Signed   By: VEAR Hurst M.D.   On: 04/07/2024 08:03   CT HEAD CODE STROKE WO CONTRAST Result Date: 04/06/2024 CLINICAL DATA:  Code stroke.  Neuro deficit, acute, stroke suspected EXAM: CT HEAD WITHOUT CONTRAST TECHNIQUE: Contiguous axial images were obtained from the base of the skull through the vertex without intravenous contrast. RADIATION DOSE REDUCTION: This exam was performed according to the departmental dose-optimization program which includes automated exposure control, adjustment of the mA and/or kV according to patient size and/or use of iterative reconstruction technique. COMPARISON:  CT 03/11/2022. FINDINGS: Brain: Chronic encephalomalacia in the right temporal and occipital lobes, unchanged. No evidence of acute large vascular territory infarct, acute hemorrhage, mass lesion or midline shift. Vascular: No hyperdense vessel. Skull: No acute fracture. Sinuses/Orbits: Clear sinuses.  No acute orbital findings. ASPECTS [facility] Stroke Program Early CT Score) Total score (0-10 with 10 being normal): 10. IMPRESSION: 1. No evidence of acute intracranial abnormality.  ASPECTS is 10. 2. Chronic right temporal/occipital encephalomalacia. Code stroke imaging results were communicated on 04/06/2024 at 11:44 pm to provider Bero via telephone.  Electronically Signed   By: Gilmore GORMAN Molt M.D.   On: 04/06/2024 23:45      Subjective: Denies any complaints this morning, reports no further left lower extremity weakness, reports she is back at baseline  Discharge Exam: Vitals:   04/08/24 0732 04/08/24 0733  BP: 110/70   Pulse:  79  Resp: 14 15  Temp:    SpO2:  100%   Vitals:   04/07/24 1300 04/07/24 1428 04/08/24 0732 04/08/24 0733  BP: 103/63  110/70   Pulse: 87   79  Resp: 20  14 15   Temp: 98.4 F (36.9 C)     TempSrc: Oral      SpO2: 100%   100%  Weight:  76.2 kg    Height:  4' 11 (1.499 m)      General: Pt is alert, awake, not in acute distress Cardiovascular: RRR, S1/S2 +, no rubs, no gallops Respiratory: CTA bilaterally, no wheezing, no rhonchi Abdominal: Soft, NT, ND, bowel sounds + Extremities: no edema, no cyanosis    The results of significant diagnostics from this hospitalization (including imaging, microbiology, ancillary and laboratory) are listed below for reference.     Microbiology: No results found for this or any previous visit (from the past 240 hours).   Labs: BNP (last 3 results) No results for input(s): BNP in the last 8760 hours. Basic Metabolic Panel: Recent Labs  Lab 04/06/24 2327 04/07/24 0111  NA 142 140  K 3.9 3.5  CL 105 105  CO2 21*  --   GLUCOSE 135* 122*  BUN 8 7  CREATININE 0.76 0.90  CALCIUM  10.1  --    Liver Function Tests: Recent Labs  Lab 04/06/24 2327  AST 22  ALT 15  ALKPHOS 119  BILITOT 0.6  PROT 7.8  ALBUMIN 4.3   No results for input(s): LIPASE, AMYLASE in the last 168 hours. No results for input(s): AMMONIA in the last 168 hours. CBC: Recent Labs  Lab 04/07/24 0019 04/07/24 0111  WBC 10.4  --   NEUTROABS 8.2*  --   HGB 12.5 13.3  HCT 38.0 39.0  MCV 84.6  --   PLT 278  --    Cardiac Enzymes: No results for input(s): CKTOTAL, CKMB, CKMBINDEX, TROPONINI in the last 168 hours. BNP: Invalid input(s): POCBNP CBG: Recent Labs  Lab 04/06/24 2342  GLUCAP 131*   D-Dimer No results for input(s): DDIMER in the last 72 hours. Hgb A1c No results for input(s): HGBA1C in the last 72 hours. Lipid Profile No results for input(s): CHOL, HDL, LDLCALC, TRIG, CHOLHDL, LDLDIRECT in the last 72 hours. Thyroid function studies No results for input(s): TSH, T4TOTAL, T3FREE, THYROIDAB in the last 72 hours.  Invalid input(s): FREET3 Anemia work up No results for input(s): VITAMINB12, FOLATE,  FERRITIN, TIBC, IRON, RETICCTPCT in the last 72 hours. Urinalysis    Component Value Date/Time   COLORURINE YELLOW 09/10/2021 1033   APPEARANCEUR HAZY (A) 09/10/2021 1033   LABSPEC 1.016 09/10/2021 1033   PHURINE 5.0 09/10/2021 1033   GLUCOSEU NEGATIVE 09/10/2021 1033   HGBUR MODERATE (A) 09/10/2021 1033   BILIRUBINUR NEGATIVE 09/10/2021 1033   KETONESUR 5 (A) 09/10/2021 1033   PROTEINUR 30 (A) 09/10/2021 1033   UROBILINOGEN 1.0 05/20/2021 1742   NITRITE NEGATIVE 09/10/2021 1033   LEUKOCYTESUR TRACE (A) 09/10/2021 1033   Sepsis Labs Recent Labs  Lab 04/07/24 0019  WBC 10.4   Microbiology No results found for this or any previous visit (from the past 240 hours).  Time coordinating discharge: Over 30 minutes  SIGNED:   Brayton Lye, MD  [facility] 04/08/2024, 12:39 PM Pager   If 7PM-7AM, please contact night-coverage [email or web address] Password [email or web address]

## 2024-04-08 NOTE — Plan of Care (Signed)
  Problem: Education: Goal: Knowledge of General Education information will improve Description: Including pain rating scale, medication(s)/side effects and non-pharmacologic comfort measures Outcome: Progressing   Problem: Activity: Goal: Risk for activity intolerance will decrease Outcome: Progressing   Problem: Pain Managment: Goal: General experience of comfort will improve and/or be controlled Outcome: Progressing

## 2024-04-08 NOTE — Care Management Obs Status (Signed)
 MEDICARE OBSERVATION STATUS NOTIFICATION   Patient Details  Name: April Reilly MRN: [id number] Date of Birth: 25-Oct-1970   Medicare Observation Status Notification Given:  No Patient refused to sign  Lashunda Greis Crawford 04/08/2024, 10:47 AM

## 2024-04-12 ENCOUNTER — Other Ambulatory Visit (HOSPITAL_COMMUNITY): Payer: Self-pay

## 2024-04-12 ENCOUNTER — Telehealth: Payer: Self-pay | Admitting: Neurology

## 2024-04-12 NOTE — Telephone Encounter (Signed)
 Patient called to schedule an appointment. Informed patient Dr. Camara has schedule an appointment on 04/15/24. Has sent to MyChart

## 2024-04-15 ENCOUNTER — Ambulatory Visit (INDEPENDENT_AMBULATORY_CARE_PROVIDER_SITE_OTHER): Admitting: Clinical

## 2024-04-15 ENCOUNTER — Ambulatory Visit (INDEPENDENT_AMBULATORY_CARE_PROVIDER_SITE_OTHER): Admitting: Neurology

## 2024-04-15 ENCOUNTER — Encounter (HOSPITAL_COMMUNITY): Payer: Self-pay | Admitting: Clinical

## 2024-04-15 ENCOUNTER — Encounter: Payer: Self-pay | Admitting: Neurology

## 2024-04-15 ENCOUNTER — Other Ambulatory Visit (HOSPITAL_COMMUNITY): Payer: Self-pay

## 2024-04-15 VITALS — BP 110/72 | HR 113 | Ht 59.0 in | Wt 168.0 lb

## 2024-04-15 DIAGNOSIS — G44229 Chronic tension-type headache, not intractable: Secondary | ICD-10-CM

## 2024-04-15 DIAGNOSIS — F0631 Mood disorder due to known physiological condition with depressive features: Secondary | ICD-10-CM

## 2024-04-15 DIAGNOSIS — S069XAD Unspecified intracranial injury with loss of consciousness status unknown, subsequent encounter: Secondary | ICD-10-CM

## 2024-04-15 DIAGNOSIS — G40919 Epilepsy, unspecified, intractable, without status epilepticus: Secondary | ICD-10-CM | POA: Diagnosis not present

## 2024-04-15 DIAGNOSIS — F064 Anxiety disorder due to known physiological condition: Secondary | ICD-10-CM

## 2024-04-15 DIAGNOSIS — I618 Other nontraumatic intracerebral hemorrhage: Secondary | ICD-10-CM

## 2024-04-15 MED ORDER — LEVETIRACETAM 750 MG PO TABS
750.0000 mg | ORAL_TABLET | Freq: Two times a day (BID) | ORAL | 0 refills | Status: DC
  Filled 2024-04-15: qty 60, 30d supply, fill #0

## 2024-04-15 MED ORDER — LAMOTRIGINE ER 200 MG PO TB24
200.0000 mg | ORAL_TABLET | Freq: Every day | ORAL | 3 refills | Status: DC
  Filled 2024-04-15: qty 90, 90d supply, fill #0

## 2024-04-15 MED ORDER — LAMOTRIGINE ER 25 MG PO TB24
ORAL_TABLET | ORAL | 0 refills | Status: DC
  Filled 2024-04-15: qty 161, 42d supply, fill #0

## 2024-04-15 NOTE — Progress Notes (Signed)
 [facility]  PATIENT: April Reilly DOB: Jun 10, 1971  REQUESTING CLINICIAN: Paseda, Folashade R, FNP HISTORY FROM: Patient REASON FOR VISIT: Headaches follow up   HISTORICAL  CHIEF COMPLAINT:  Chief Complaint  Patient presents with   RM12/HEADACHE    Pt is here Alone. Pt states that her headaches been bad. Pt states she was in the hospital 04/06/2024 for Seizures.     INTERVAL HISTORY 04/15/2024 Patient presents today for follow-up, last visit was in June at that time we continued her medications including lamotrigine , propranolol  and Paxil .  Unfortunately on September 17 she was admitted to the hospital due to breakthrough seizure.  She had a couple seizures at home with tongue biting and urinary incontinence.  She tells us  that she self discontinued her lamotrigine  for about 6 weeks prior to her seizure.  She did not call us  to inform us .  At discharge from the hospital, she was recommended Keppra  due to lamotrigine  needing another taper.  She is currently on Keppra  750 mg twice daily, does not report any side effect but would like to go back on her previous dose of lamotrigine  XR 200 mg daily.   INTERVAL HISTORY 01/09/2024 April Reilly presents today for follow-up, last visit was in April, at that time we continued her current medications and added propranolol  for headache prophylaxis.  Today she tells me she doing better, she has been traveling to [place]  recently.  Feels like her mood is better while she is in [place] , she does not experience any headaches but in [place]  she will have headaches once every couple days.  She also reports some increased stress related to her daughter and grand kid living with her.  No seizure or seizure like activity.  She still has to write things down in order to remember.   INTERVAL HISTORY 11/05/2023:  Patient presents today for follow-up, last visit was in January, since then she tells me that she is still having headaches, on  average 2-3 headaches per week.  Headache can last the whole day.  Ibuprofen  help but she still having constant headache. Tells me that her anxiety is still a problem.  She lies in bed but cannot sleep for about 3 to 4 hours usually.  Fall asleep around 5 or 6 AM and wake up around 9 AM.  She remains on Paxil  Denies any seizure or seizure activity.  She is currently on lamotrigine  200 mg daily, Keppra  discontinued.   INTERVAL HISTORY 08/20/2023:  Patient presents today for follow-up, she is alone.  Last visit was in October, since then she has not had any seizure or seizure-like activity.  She is compliant with Keppra  500 mg twice daily but has side effect of irritability but stated it is improving.  Still complaining of headaches, patient tells me that she is undergoing therapy to help with her anxiety and depression.  She has tried Ibuprofen  800 mg once and it was able to control her headaches. Currently her daughter and grandson are staying with her. Still having memory loss, uses a note book to keep track of her daily activity and keep important information.    INTERVAL HISTORY 04/22/2023:  Patient presents for follow-up, last visit was in July since then he has not had any seizure or seizure activity.  She is compliant with her medications including Keppra  500 mg twice daily and Flexeril .  She she is not sure if she is taking the amitriptyline  or not. We repeated her EEG which showed right  hemispheric slowing, no epileptiform discharges and no seizures.    complaining of chronic headaches, we prescribed her Nurtec but it was not approved and patient started on Qulipta .  While taking the Qulipta  she developed new hallucinations therefore discontinued.  Since discontinuing the Qulipta , she has not had any similar symptoms.  Her headaches, occur every other day.  She reports that Flexeril  helps.  She still struggles with memory, reports that she wants to go back to work but unsure if she can actually  hold a job because now she has right   INTERVAL HISTORY 01/28/2023:  Patient presents today for follow-up, she is alone.  Last visit was in January and since then she has travelled to [place]  and saw her PCP, had workup which was unrevealing.  She reports compliance with her medications, denies any seizure or seizure like activity.  She remains on the Keppra  500 mg twice daily.  She continues to complain of headaches, chronic headaches.  She has tried topiramate  and amitriptyline  without any benefit.  She continues on Motrin  as needed for the headaches.  Reports that her headaches are almost daily.  She still reports anxiety, feels like her anxiety is worse when she is home alone by herself, sometimes she hears a phone ring when no phone is ringing.  She reports a few weeks ago her sister came and stay with her and during that time, she did not have any hallucinations, did not have any symptom of anxiety.  While she was in [place]  also visiting family, she did not experience any hallucinations and no worsening anxiety while in the house but she will get anxious when she is in the crowd in public.   INTERVAL HISTORY 07/30/2022:  Patient presents today for follow-up, at last visit in October plan was to discontinue Topamax , start Elavil  and Paxil .  Patient has not started those medications and she is still on Topamax .  She still complains of severe headaches about 2-3 times per week.  When she had these bad headaches, she takes muscle relaxant.  She also reports increase in anxiety, trouble falling asleep, patient's reported she is scare to sleep because she lives alone.  Lately she has been sleeping with a loaded gun on her bed. She still has not set up care with a primary care physician   INTERVAL HISTORY 05/07/2022:  April Reilly presents today for follow-up, she is alone.  Since last visit she had an episode of severe vaginal bleeding, her Xarelto  was discontinued.  Since she has been anticoagulated for 8  months,  it was reasonable to discontinue Xarelto .  She is presenting today complaining of headache, at last visit we had increased her Topamax  to 100 mg nightly but she still reports headache that usually start in the back of the neck.  She reported Flexeril  helps but she ran out of medication.  She also tried Fioricet  which helped her.  She denies any migrainous features, denies any nausea or vomiting.  She reported she is very anxious and worry about her brain injury, she cannot remember her seizure and worried about having another seizure.  Her vision is still affected, and wonder if she will get her vision back. She has not seen oph due to lack of insurance.    INTERVAL HISTORY 03/12/2022:  Patient presents today for follow-up, last visit was in June at that time plan was to continue with Eliquis  and obtain a repeat CT venogram.  She reported she ran out of Eliquis  and actually  her insurance dropped her because of FMLA.  Luckily she did follow-up with Dr. Loretha, hematologist and patient was started on Xarelto .  She did have her CT venogram yesterday but pending official read.  She reports that she still having occasional headaches that is relieved by muscle relaxant, her vision is still poor and she is pending a ophthalmology follow-up next week.  Denies any recent fall.  Denies any new focal weakness.     INTERVAL HISTORY 12/24/21:  Patient presents today for follow-up, her CT venogram was repeated and it showed residual nonocclusive thrombus involving the distal right transverse and sigmoid sinus sinuses.,  Persistent but somewhat improved in appearance as compared to previous exam.  No new dural sinus thrombosis.  She remains on Eliquis  5 mg twice daily.  She did follow-up with primary care doctor and hematology.  She is pending a complete hematology work-up.  Currently she is complaining of left shoulder arm pain, some migraine headaches.  She ran out of some of her medications more than a month ago  and have not refill it.    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  She was initiated on anticoagulation.  Neurosurgery was consulted but patient did not have a craniectomy.  She developed seizures, started on Keppra   Her symptoms improve, she was discharge to acute rehab and her plan was to continue with anticoagulation for at least 6 months.  Patient report prior to her hospitalization she has been doing well, she has chronic anemia, sickle cell disease but she was not taken on any medication.  She reports a few weeks prior to presentation she developed vaginal bleeding and she was started on megestrol  acetate.  Since discharge from the hospital, daughter reported improvement of her mental status, confusion improved, she does continue with physical therapy and she is getting more independent.  She is compliant with all medications.  Patient reports prior to her hospitalization she was not taking any medication but since discharge she is on 13 medications, she feels like she does not need all of them.   Hospital course below. April Reilly is a 53 y.o. female with history of SS trait, uterine fibroid, abnormal vaginal bleeding x4 months who was admitted on 09/10/2021 with 3-day history of malaise, fall and progressive somnolence.  She was found to have RLL aspiration pneumonia as well as IPH due to right hemorrhagic infarct with edema and right frontal, parietal, occipital and temporal lobes and 9 mm right to left midline shift.  She was started on  hypertonic saline and EEG done showing evidence of epileptogenicity with high potential for seizures.  Work-up also revealed subocclusive clot in right distal right transverse sinus and right sigmoid dural venous sinus and she was started on IV heparin .  Dr. Jerri felt that patient with large right frontoparietal venous infarct with hemorrhagic conversion due to unclear etiology.  Neurosurgery was consulted due to concerns about the herniation and recommended conservative care with serial CCT for monitoring. She was loaded with Dilantin  due to concerns of seizure and placed  on long-term EEG.  Dilantin  was discontinued and Keppra  added on 02/26.  Hypertonic saline was weaned off and mentation was slowly improving.  Repeat CTA/V of 02/27 showed evolving hemorrhagic infarct with mild improvement in mass effect and improved nonocclusive thrombus in right distal transverse and sigmoid sinus.  She was transition to DOAC on 02/28 with neurology recommending 3 to 6 months of AC as well as close neurological follow-up.  Hospital course was significant for issues with headaches, hypotension, tachycardia, left-sided weakness with balance deficits as well as cognitive deficits and disorientation.  CIR was recommended due to functional decline.    OTHER MEDICAL CONDITIONS: SS Trait, anemia    REVIEW OF SYSTEMS: Full 14 system review of systems performed and negative with exception of: as noted in the HPI   ALLERGIES: Allergies  Allergen Reactions   Megestrol  Other (See Comments) and Swelling    Caused blood clot in brain  Caused blood clot in brain    Caused blood clot in brain Caused blood clot in brain   Qulipta  [Atogepant ] Anxiety    HALLUCINATION    HOME MEDICATIONS: Outpatient Medications Prior to Visit  Medication Sig Dispense Refill   albuterol  (VENTOLIN  HFA) 108 (90 Base) MCG/ACT inhaler Inhale 2 puffs into the lungs every 6 (six) hours as needed for wheezing or shortness of breath. 18 g 3    aspirin  EC 81 MG tablet Take 1 tablet (81 mg total) by mouth daily. Swallow whole. 90 tablet 3   cyclobenzaprine  (FLEXERIL ) 10 MG tablet Take 1 tablet (10 mg total) by mouth 3 (three) times daily as needed for muscle spasms. 30 tablet 3   ibuprofen  (ADVIL ) 200 MG tablet Take 200 mg by mouth every 6 (six) hours as needed.     pantoprazole  (PROTONIX ) 40 MG tablet Take 1 tablet (40 mg total) by mouth daily. 30 tablet 0   levETIRAcetam  (KEPPRA ) 750 MG tablet Take 1 tablet (750 mg total) by mouth 2 (two) times daily. 60 tablet 0   gabapentin  (NEURONTIN ) 300 MG capsule Take 1 capsule (300 mg total) by mouth at bedtime. (Patient not taking: Reported on 04/15/2024) 90 capsule 3   PARoxetine  (PAXIL ) 20 MG tablet Take 1 tablet (20 mg total) by mouth daily. (Patient not taking: Reported on 04/15/2024) 90 tablet 3   propranolol  ER (INDERAL  LA) 60 MG 24 hr capsule Take 1 capsule (60 mg total) by mouth daily. (Patient not taking: Reported on 04/15/2024) 90 capsule 3   No facility-administered medications prior to visit.    PAST MEDICAL HISTORY: Past Medical History:  Diagnosis Date   Asthma    Stroke (HCC)    Vaginal bleeding, abnormal    for 4 months    PAST SURGICAL HISTORY: Past Surgical History:  Procedure Laterality Date   FOOT SURGERY     TUBAL LIGATION      FAMILY HISTORY: Family History  Problem Relation Age of Onset   Clotting disorder Mother     SOCIAL HISTORY: Social History   Socioeconomic History   Marital status: Single    Spouse name: Not on file   Number of children: 4   Years of education: Not on file   Highest education level: Not on file  Occupational History   Not on file  Tobacco Use   Smoking status: Never   Smokeless tobacco: Never  Substance and Sexual Activity   Alcohol use: Not Currently   Drug use: Never   Sexual activity: Not Currently  Other Topics Concern  Not on file  Social History Narrative      Right handed   Caffeine  ocassional   Lives  alone with dog   Social Drivers of Health   Financial Resource Strain: Not on file  Food Insecurity: No Food Insecurity (04/07/2024)   Hunger Vital Sign    Worried About Running Out of Food in the Last Year: Never true    Ran Out of Food in the Last Year: Never true  Transportation Needs: No Transportation Needs (04/07/2024)   PRAPARE - Administrator, Civil Service (Medical): No    Lack of Transportation (Non-Medical): No  Physical Activity: Not on file  Stress: Low Risk (02/27/2020)   [facility]   Stress    Stress Risk: Not on file    General Anxiety Disorder (GAD-7) Score: Not on file  Social Connections: Not on file  Intimate Partner Violence: Not At Risk (04/07/2024)   Humiliation, Afraid, Rape, and Kick questionnaire    Fear of Current or Ex-Partner: No    Emotionally Abused: No    Physically Abused: No    Sexually Abused: No    PHYSICAL EXAM  GENERAL EXAM/CONSTITUTIONAL: Vitals:  Vitals:   04/15/24 1344  BP: 110/72  Pulse: (!) 113  Weight: 168 lb (76.2 kg)  Height: 4' 11 (1.499 m)     Body mass index is 33.93 kg/m. Wt Readings from Last 3 Encounters:  04/15/24 168 lb (76.2 kg)  04/07/24 168 lb (76.2 kg)  01/09/24 172 lb 3.2 oz (78.1 kg)   Patient is in no distress; well developed, nourished and groomed; neck is supple  MUSCULOSKELETAL: Gait, strength, tone, movements noted in Neurologic exam below  NEUROLOGIC: MENTAL STATUS:      No data to display         awake, alert, oriented to person, place and time Difficulty with recent memory, has to write thing down  normal attention and concentration language fluent, comprehension intact, naming intact fund of knowledge appropriate  CRANIAL NERVE:  2nd, 3rd, 4th, 6th - Left visual field cut, extraocular muscles intact, no nystagmus 5th - facial sensation symmetric 7th - facial strength symmetric 8th - hearing intact 9th - palate elevates symmetrically, uvula  midline 11th - shoulder shrug symmetric 12th - tongue protrusion midline  MOTOR:  normal bulk and tone, full strength in the BUE, BLE  SENSORY:  normal and symmetric to light touch  COORDINATION:  finger-nose-finger, fine finger movements normal  GAIT/STATION:  normal   DIAGNOSTIC DATA (LABS, IMAGING, TESTING) - I reviewed patient records, labs, notes, testing and imaging myself where available.  Lab Results  Component Value Date   WBC 10.4 04/07/2024   HGB 13.3 04/07/2024   HCT 39.0 04/07/2024   MCV 84.6 04/07/2024   PLT 278 04/07/2024      Component Value Date/Time   NA 140 04/07/2024 0111   K 3.5 04/07/2024 0111   CL 105 04/07/2024 0111   CO2 21 (L) 04/06/2024 2327   GLUCOSE 122 (H) 04/07/2024 0111   BUN 7 04/07/2024 0111   CREATININE 0.90 04/07/2024 0111   CALCIUM  10.1 04/06/2024 2327   PROT 7.8 04/06/2024 2327   ALBUMIN 4.3 04/06/2024 2327   AST 22 04/06/2024 2327   ALT 15 04/06/2024 2327   ALKPHOS 119 04/06/2024 2327   BILITOT 0.6 04/06/2024 2327   GFRNONAA >60 04/06/2024 2327   Lab Results  Component Value Date   CHOL 139 09/11/2021   HDL 46 09/11/2021  LDLCALC 84 09/11/2021   TRIG 47 09/11/2021   CHOLHDL 3.0 09/11/2021   Lab Results  Component Value Date   HGBA1C 5.3 09/11/2021   Lab Results  Component Value Date   VITAMINB12 364 09/11/2021   Lab Results  Component Value Date   TSH 0.34 (A) 10/01/2023    CT Venogram 11/28/2021 1. 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 Previously seen blood products have resolved. 2. Residual nonocclusive thrombus involving the distal right transverse and sigmoid sinuses, persistent but somewhat improved in appearance as compared to previous exam. No new dural sinus thrombosis. 3. No other new acute intracranial abnormality.  CT Venogram 09/17/21. Evolving hemorrhagic infarction primarily involving temporal and  occipital lobes with extension into the parietal lobe. No new hemorrhage. Mass effect is stable to mildly improved. Persistent but improved nonocclusive thrombus within the right distal transverse and sigmoid sinuses   Head CT 09/10/21 Large region of cortical/subcortical edema within the right frontal, parietal, occipital and temporal lobes, as well as right insula and subinsular region. Superimposed patchy foci of acute parenchymal hemorrhage within the right parietal, occipital and temporal lobes. These findings are favored to reflect an acute/early subacute infarct from arterial ischemia (with hemorrhagic conversion) or a hemorrhagic infarct due to intracranial venous thrombosis. An MRI of the brain with contrast (including thin-slice T1-weighted post-contrast imaging for assessment of the intracranial venous structures) is recommended for further evaluation. A CTA of the head/neck should also be considered.   Associated mass effect with significant partial effacement of the right lateral ventricle and 9 mm leftward midline shift. The basal cisterns are effaced. Neurosurgical consultation is advised.   Moderate-volume extension of hemorrhage into the right lateral ventricle is questioned. No definite evidence of hydrocephalus or ventricular entrapment at this time   MRI Brain 09/10/21 1. Large area of cortical infarction in the right temporal, occipital, parietal, and posterior frontal lobes, with significant associated edema and redemonstrated intraparenchymal hemorrhage. The mass effect causes 11 mm of right-to-left midline shift, medial displacement of the right uncus and right temporal horn and effacement of the basal cisterns, unchanged from the prior CT and raising concern for right uncal herniation. 2. Additional focal area of restricted diffusion in the medial aspect of the right basal ganglia, suspected to be in the preoptic area of the hypothalamus, likely sequela of aforementioned mass  effect. 3. Hemorrhage layering in the occipital horns of the lateral ventricles, with redemonstrated effacement of the right lateral and third ventricle. No evidence of hydrocephalus. 4. Small amount of subdural hemorrhage along the right frontal lobe, measuring up to 4 mm. 5. Filling defect in the distal right transverse sinus and proximal and mid right sigmoid sinus, consistent with thrombus, as seen on the same-day CTV.   LTM EEG 09/11/21 - Sharp waves,  right hemisphere, maximal right parieto-occipital region  - Continuous slow, generalized and lateralized right hemisphere   Routine EEG 02/17/2023 - Right frontotemporal slowing    ASSESSMENT AND PLAN  53 y.o. 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  Patient did have subsequent seizures and brain edema but no seizures since leaving the hospital in February 2023.  She has been anticoagulated initially on heparin  and DOAC and now off anticoagulation and on Aspirin  alone. She understands the Aspirin  is lifelong.  Unfortunately she did have a breakthrough  seizure in September 17 due to medication nonadherence.  She was restarted on Keppra  750 mg twice daily but would like to go back to her previous dose of lamotrigine  200 mg daily. Will start patient on lamotrigine  XR 25 mg daily with plan to increase to 200 mg daily in 6 weeks.  During this time patient understand to continue with Keppra  750 mg twice daily.  She will present to the office in 6 to 8 weeks for labs, at that time we will decrease and then stop Keppra .    1. Breakthrough seizure (HCC)   2. Other right-sided nontraumatic intracerebral hemorrhage (HCC)   3. Chronic tension-type headache, not intractable       Patient Instructions  Continue with Keppra  7 5 mg twice daily Start lamotrigine  XR 25 mg daily for 1 week Then  increase to 50 mg daily for 1 week Then increase to 100 mg daily for 2 weeks Then increase to 100 mg daily for 2 weeks Then increase to 200 mg daily Once she start taking 200 mg daily please return to the office for blood for blood work Please contact us  if you have any side effect or any breakthrough seizure.   No orders of the defined types were placed in this encounter.   Meds ordered this encounter  Medications   levETIRAcetam  (KEPPRA ) 750 MG tablet    Sig: Take 1 tablet (750 mg total) by mouth 2 (two) times daily.    Dispense:  60 tablet    Refill:  0   LamoTRIgine  (LAMICTAL  XR) 25 MG TB24 24 hour tablet    Sig: Take 1 tablet (25 mg total) by mouth daily for 7 days, THEN 2 tablets (50 mg total) daily for 7 days, THEN 4 tablets (100 mg total) daily for 14 days, THEN 6 tablets (150 mg total) daily for 14 days.    Dispense:  161 tablet    Refill:  0   LamoTRIgine  200 MG TB24 24 hour tablet    Sig: Take 1 tablet (200 mg total) by mouth daily.    Dispense:  90 tablet    Refill:  3    No follow-ups on file.   Pastor Falling, MD 04/16/2024, 9:19 AM  [facility] [address], [address] [place], [place] [address] [phone]

## 2024-04-15 NOTE — Progress Notes (Addendum)
 THERAPIST PROGRESS NOTE  Session Time:   4:13pm-5:00pm  Session #39  Participation Level: Active  Behavioral Response: Casual Alert Irritable   Type of Therapy: Individual Therapy   Treatment Goals addressed:  STG: Merion will practice problem solving skills 3 times per week for the next 4 weeks. STG: Desera will reduce frequency of avoidant behaviors by 50% as evidenced by self-report in therapy sessions LTG: Celie will be able to acknowledge and accept her new way of life with the brain injury 5 days out of 7 STG: Alorah will practice how to handle anxiety-provoking situations where she is faced with sharing information about her brain injury and/or making decisions LTG: Reduce frequency, intensity, and duration of depression symptoms so that daily functioning is improved LTG: Yaniah will be able to identify reasons her brain injury can cause depression symptoms and recognize when this is happening. STG: Artina will be able to talk about her brain injury without becoming overwhelmed with negative emotion. LTG: Naliah will engage in at least 1 social activity outside of family per month STG: Ruthann will learn about boundaries and how to set them AEB her ability to successful set boundaries by self-report STG: Score less than 9 on the PHQ-9 and less than 5 on the GAD-7 as evidenced by intermittent administration of the questionnaires to determine progress in managing depression and anxiety.  LTG: Learn and practice communication techniques such as active listening, I statements, open-ended questions, fair fighting rules, initiating conversations;  learn about boundary types and how to implement/enforce them AEB self-report of use of same.  STG: Learn breathing techniques and grounding techniques at an age-appropriate and ability-appropriate level and demonstrate mastery in session then report independent use of these skills out of session.    STG: Process life events to the extent  needed so that will be able to move forward with various areas of life in a better frame of mind per self-report.    ProgressTowards Goals: Progressing  Interventions: Supportive    Summary: April Reilly is a 53 y.o. 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  She presented oriented x5 and stated she was feeling I've been aggravated.  CSW evaluated patient's medication compliance, use of coping tools, and self-care, as applicable.  She provided an update on various aspects of her life that are normally discussed in therapy, including mostly the incident of having 2 seizures that ended up in her staying in the hospital under observation for 3 days.  She had a lot of irritation throughout that process, was ordering people out of her room because she did not want anybody but her own neurologist who very rarely does hospital rounds.  She finally disclosed that she had on her own decided to stop her seizure medication, finally with prodding revealed that she had gone 4-6 months without her medication because I was not born with epilepsy.  CSW admonished her that she is not a doctor, which made her laugh.  She talked about how having espresso drinks was thought to have contributed to her seizures.  On the same day that she had the seizures, she sat down earlier in the day with a complete stranger, a young man who was talking about suicide.  CSW pointed out to her that she can be so kind to a stranger, then when it comes to people she knows she can be quite abrupt.  She laughed at this, but  acknowledged that she has a rough edge to her, but feels that people deserve the treatment they get.  We explored this some and she ultimately came to understand that her hostility is very wrapped up in the fact that she had such a horrible life-changing incident that happened at a hospital in the system so it takes longevity for  her to trust new doctors.  She very much trusts Dr. Gregg, however.  We also discussed her pending visit to [place]  to see her boyfriend and her eventual move there, where she really feels she will be happier.  Suicidal/Homicidal: No without intent/plan  Therapist Response:   Patient is progressing AEB engaging in scheduled therapy session.  Throughout the session, CSW gave patient the opportunity to explore thoughts and feelings associated with current life situations and past/present stressors.   CSW challenged patient gently and appropriately to consider different ways of looking at reported issues. CSW encouraged patient's expression of feelings and validated these using empathy, active listening, open body language, and unconditional positive regard.        Plan/Recommendations:  Return to therapy in 2 weeks to next scheduled appointment on 10/9, reflect on what was discussed in session, engage in self care behaviors as explored in session, do homework as assigned (take her medicine as prescribed not as she decides), and return to next session prepared to talk about experience with new coping methods.   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 Patient/Guardian was advised if they have not already done so to contact the registration department to sign all necessary forms in order for us  to release information regarding their care.   Consent: Patient/Guardian gives verbal consent for treatment and assignment of benefits for services provided during this visit. Patient/Guardian expressed understanding and agreed to  proceed.   [name], LCSW 04/15/2024

## 2024-04-16 ENCOUNTER — Other Ambulatory Visit (HOSPITAL_COMMUNITY): Payer: Self-pay

## 2024-04-16 NOTE — Patient Instructions (Addendum)
 Continue with Keppra  7 5 mg twice daily Start lamotrigine  XR 25 mg daily for 1 week Then increase to 50 mg daily for 1 week Then increase to 100 mg daily for 2 weeks Then increase to 100 mg daily for 2 weeks Then increase to 200 mg daily Once she start taking 200 mg daily please return to the office for blood for blood work Please contact us  if you have any side effect or any breakthrough seizure.

## 2024-04-26 ENCOUNTER — Other Ambulatory Visit (HOSPITAL_COMMUNITY): Payer: Self-pay

## 2024-04-29 ENCOUNTER — Ambulatory Visit (INDEPENDENT_AMBULATORY_CARE_PROVIDER_SITE_OTHER): Admitting: Clinical

## 2024-04-29 DIAGNOSIS — F0631 Mood disorder due to known physiological condition with depressive features: Secondary | ICD-10-CM | POA: Diagnosis not present

## 2024-04-29 DIAGNOSIS — S069XAD Unspecified intracranial injury with loss of consciousness status unknown, subsequent encounter: Secondary | ICD-10-CM | POA: Diagnosis not present

## 2024-04-29 DIAGNOSIS — F064 Anxiety disorder due to known physiological condition: Secondary | ICD-10-CM

## 2024-04-29 NOTE — Progress Notes (Unsigned)
 THERAPIST PROGRESS NOTE  Session Time:   4:10pm-5:07pm  Session #40  Participation Level: Active  Behavioral Response: Casual Alert Euthymic   Type of Therapy: Individual Therapy   Treatment Goals addressed:  New treatment goals established, current goals reviewed:  STG: Score less than 9 on the PHQ-9 and less than 5 on the GAD-7 as evidenced by intermittent administration of the questionnaires to determine progress in managing depression and anxiety. STG: Explore personal core beliefs, rules and assumptions, and cognitive distortions through therapist using Cognitive Behavioral Therapy; learn about replacement thoughts, Behavioral Activation and Acting As If AEB using 2 times weekly.  STG: Process life events to the extent needed so that will be able to move forward with various areas of life in a better frame of mind per self-report of improved satisfaction with life 5 out of 7 days over the next 6 months.  STG: Learn about boundary styles and types, how to implement them, and how to enforce them, then report feeling more empowered and content with being able to maintain more helpful, appropriate boundaries in the future for a more balanced result. LTG: April Reilly will be able to acknowledge and accept her new way of life with the brain injury 5 days out of 7  STG: Learn breathing techniques and grounding techniques at an age-appropriate and ability-appropriate level and demonstrate mastery in session then report independent use of these skills out of session.  LTG: April Reilly will be able to identify at least 2 reasons her brain injury can cause depression symptoms, recognize when this is happening, and become able to verbalize that this is not something she can control. [facility] and practice communication techniques such as active listening, I statements, open-ended questions, reflective listening, assertiveness, fair fighting rules, initiating conversations, and more as necessary and taught in  session. STG: April Reilly will be able to talk about her brain injury without becoming overwhelmed with negative emotion and/or agitated 3 out of 4 sessions with therapist.  ProgressTowards Goals: Progressing  Interventions: Supportive and Other: processing of brain injury    Summary: April Reilly is a 53 y.o. 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  She presented oriented x5 and stated she was feeling tired and sleepy all the time, ever since getting out of the hospital I've been so tired.  CSW evaluated patient's medication compliance, use of coping tools, and self-care, as applicable.  She provided an update on various aspects of her life that are normally discussed in therapy, including her problematic sleeping all the time, an event during which she lost track of time while cooking and burned up a pan, her gas being turned off due to being too high to pay it, reconciliation with her oldest daughter after first having a fight to express her feelings, and feelings about daughter and grandson who live with her.  We explored her feelings at length.  She remains irritable and defiant about having a brain injury, does not accept at all that this changes her life, states I wasn't born this way.  CSW used reflective listening to explore further.  A new treatment plan was written, indicating that she is not making progress specifically in dealing with the reality of her brain injury and the subsequent changes to her life.    Suicidal/Homicidal: No without intent/plan  Therapist Response:   Patient is progressing AEB engaging in scheduled therapy session.  Throughout the session, CSW gave  patient the opportunity to explore thoughts and feelings associated with current life situations and past/present stressors.   CSW challenged patient gently and appropriately to consider different ways of looking at reported  issues. CSW encouraged patient's expression of feelings and validated these using empathy, active listening, open body language, and unconditional positive regard.        Plan/Recommendations:  Return to therapy in 2 weeks to next scheduled appointment on 10/23, reflect on what was discussed in session, engage in self care behaviors as explored in session, do homework as assigned (medication adherence, get daughters' help to write what she wants to say on paperwork), and return to next session prepared to talk about experience with new coping methods.   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 Patient/Guardian was advised if they have not already done so to contact the registration department to sign all necessary forms in order for us  to release information regarding their care.   Consent: Patient/Guardian gives verbal consent for treatment and assignment of benefits for services provided during this visit. Patient/Guardian expressed understanding and agreed to proceed.   April JINNY Crest, LCSW 04/29/2024

## 2024-04-30 ENCOUNTER — Encounter (HOSPITAL_COMMUNITY): Payer: Self-pay | Admitting: Clinical

## 2024-05-13 ENCOUNTER — Ambulatory Visit (HOSPITAL_COMMUNITY): Admitting: Clinical

## 2024-05-13 NOTE — Progress Notes (Signed)
 Therapy Progress Note  Patient had an appointment scheduled with therapist on 05/13/2024  at 4:00pm.  She called in at 2:30pm to state she cannot come today.  This will be shown as a no-show.  Encounter Diagnosis  Name Primary?   No-show for appointment Yes     Elgie Crest, LCSW 05/13/2024, 4:08 PM

## 2024-05-24 ENCOUNTER — Encounter: Payer: Self-pay | Admitting: Radiology

## 2024-05-25 ENCOUNTER — Encounter: Payer: Self-pay | Admitting: Neurology

## 2024-05-25 ENCOUNTER — Ambulatory Visit: Admitting: Neurology

## 2024-05-25 VITALS — BP 173/44 | HR 69 | Ht 59.0 in | Wt 170.0 lb

## 2024-05-25 DIAGNOSIS — H5340 Unspecified visual field defects: Secondary | ICD-10-CM | POA: Diagnosis not present

## 2024-05-25 DIAGNOSIS — G40919 Epilepsy, unspecified, intractable, without status epilepticus: Secondary | ICD-10-CM | POA: Diagnosis not present

## 2024-05-25 DIAGNOSIS — G44229 Chronic tension-type headache, not intractable: Secondary | ICD-10-CM | POA: Diagnosis not present

## 2024-05-25 DIAGNOSIS — G08 Intracranial and intraspinal phlebitis and thrombophlebitis: Secondary | ICD-10-CM

## 2024-05-25 DIAGNOSIS — Z5181 Encounter for therapeutic drug level monitoring: Secondary | ICD-10-CM | POA: Diagnosis not present

## 2024-05-25 MED ORDER — LAMOTRIGINE ER 200 MG PO TB24: 200.0000 mg | ORAL_TABLET | Freq: Every day | ORAL | 3 refills | Status: AC

## 2024-05-25 NOTE — Patient Instructions (Signed)
 Will obtain a lamotrigine  level and BMP If lamotrigine  level in range, plan will be to reduce Keppra  to 750 mg nightly for 2 weeks then further reduce to 375 mg nightly for 2 weeks then stop Please contact me if you have another breakthrough seizure or any other questions or concerns Return as scheduled in January

## 2024-05-25 NOTE — Progress Notes (Signed)
 [facility]  PATIENT: April Reilly DOB: April 14, 1971  REQUESTING CLINICIAN: Paseda, Folashade R, FNP HISTORY FROM: Patient REASON FOR VISIT: Headaches follow up   HISTORICAL  CHIEF COMPLAINT:  Chief Complaint  Patient presents with   RM13    Pt is here Alone.     INTERVAL HISTORY 05/25/2024 April Reilly presents today for follow-up, last visit was in September at that time we added lamotrigine  with the goal of switching Keppra  to lamotrigine .  She has completed titration, and currently taking lamotrigine  XR 200 mg daily.  Denies any side effect from the medication.  She is still taking the Keppra  and reports some somnolence and some mood swings.  Denies any seizure or seizure-like activity, tells me that her headaches are better. She had also self discontinued her Paxil  and propranolol    INTERVAL HISTORY 04/15/2024 Patient presents today for follow-up, last visit was in June at that time we continued her medications including lamotrigine , propranolol  and Paxil .  Unfortunately on September 17 she was admitted to the hospital due to breakthrough seizure.  She had a couple seizures at home with tongue biting and urinary incontinence.  She tells us  that she self discontinued her lamotrigine  for about 6 weeks prior to her seizure.  She did not call us  to inform us .  At discharge from the hospital, she was recommended Keppra  due to lamotrigine  needing another taper.  She is currently on Keppra  750 mg twice daily, does not report any side effect but would like to go back on her previous dose of lamotrigine  XR 200 mg daily.   INTERVAL HISTORY 01/09/2024 April Reilly presents today for follow-up, last visit was in April, at that time we continued her current medications and added propranolol  for headache prophylaxis.  Today she tells me she doing better, she has been traveling to [place]  recently.  Feels like her mood is better while she is in [place] , she does not experience any  headaches but in [place]  she will have headaches once every couple days.  She also reports some increased stress related to her daughter and grand kid living with her.  No seizure or seizure like activity.  She still has to write things down in order to remember.   INTERVAL HISTORY 11/05/2023:  Patient presents today for follow-up, last visit was in January, since then she tells me that she is still having headaches, on average 2-3 headaches per week.  Headache can last the whole day.  Ibuprofen  help but she still having constant headache. Tells me that her anxiety is still a problem.  She lies in bed but cannot sleep for about 3 to 4 hours usually.  Fall asleep around 5 or 6 AM and wake up around 9 AM.  She remains on Paxil  Denies any seizure or seizure activity.  She is currently on lamotrigine  200 mg daily, Keppra  discontinued.   INTERVAL HISTORY 08/20/2023:  Patient presents today for follow-up, she is alone.  Last visit was in October, since then she has not had any seizure or seizure-like activity.  She is compliant with Keppra  500 mg twice daily but has side effect of irritability but stated it is improving.  Still complaining of headaches, patient tells me that she is undergoing therapy to help with her anxiety and depression.  She has tried Ibuprofen  800 mg once and it was able to control her headaches. Currently her daughter and grandson are staying with her. Still having memory loss, uses a note book to keep track  of her daily activity and keep important information.    INTERVAL HISTORY 04/22/2023:  Patient presents for follow-up, last visit was in July since then he has not had any seizure or seizure activity.  She is compliant with her medications including Keppra  500 mg twice daily and Flexeril .  She she is not sure if she is taking the amitriptyline  or not. We repeated her EEG which showed right hemispheric slowing, no epileptiform discharges and no seizures.    complaining of  chronic headaches, we prescribed her Nurtec but it was not approved and patient started on Qulipta .  While taking the Qulipta  she developed new hallucinations therefore discontinued.  Since discontinuing the Qulipta , she has not had any similar symptoms.  Her headaches, occur every other day.  She reports that Flexeril  helps.  She still struggles with memory, reports that she wants to go back to work but unsure if she can actually hold a job because now she has right   INTERVAL HISTORY 01/28/2023:  Patient presents today for follow-up, she is alone.  Last visit was in January and since then she has travelled to [place]  and saw her PCP, had workup which was unrevealing.  She reports compliance with her medications, denies any seizure or seizure like activity.  She remains on the Keppra  500 mg twice daily.  She continues to complain of headaches, chronic headaches.  She has tried topiramate  and amitriptyline  without any benefit.  She continues on Motrin  as needed for the headaches.  Reports that her headaches are almost daily.  She still reports anxiety, feels like her anxiety is worse when she is home alone by herself, sometimes she hears a phone ring when no phone is ringing.  She reports a few weeks ago her sister came and stay with her and during that time, she did not have any hallucinations, did not have any symptom of anxiety.  While she was in [place]  also visiting family, she did not experience any hallucinations and no worsening anxiety while in the house but she will get anxious when she is in the crowd in public.   INTERVAL HISTORY 07/30/2022:  Patient presents today for follow-up, at last visit in October plan was to discontinue Topamax , start Elavil  and Paxil .  Patient has not started those medications and she is still on Topamax .  She still complains of severe headaches about 2-3 times per week.  When she had these bad headaches, she takes muscle relaxant.  She also reports increase in anxiety,  trouble falling asleep, patient's reported she is scare to sleep because she lives alone.  Lately she has been sleeping with a loaded gun on her bed. She still has not set up care with a primary care physician   INTERVAL HISTORY 05/07/2022:  Miya presents today for follow-up, she is alone.  Since last visit she had an episode of severe vaginal bleeding, her Xarelto  was discontinued.  Since she has been anticoagulated for 8 months,  it was reasonable to discontinue Xarelto .  She is presenting today complaining of headache, at last visit we had increased her Topamax  to 100 mg nightly but she still reports headache that usually start in the back of the neck.  She reported Flexeril  helps but she ran out of medication.  She also tried Fioricet  which helped her.  She denies any migrainous features, denies any nausea or vomiting.  She reported she is very anxious and worry about her brain injury, she cannot remember her seizure and worried  about having another seizure.  Her vision is still affected, and wonder if she will get her vision back. She has not seen oph due to lack of insurance.    INTERVAL HISTORY 03/12/2022:  Patient presents today for follow-up, last visit was in June at that time plan was to continue with Eliquis  and obtain a repeat CT venogram.  She reported she ran out of Eliquis  and actually her insurance dropped her because of FMLA.  Luckily she did follow-up with Dr. Loretha, hematologist and patient was started on Xarelto .  She did have her CT venogram yesterday but pending official read.  She reports that she still having occasional headaches that is relieved by muscle relaxant, her vision is still poor and she is pending a ophthalmology follow-up next week.  Denies any recent fall.  Denies any new focal weakness.     INTERVAL HISTORY 12/24/21:  Patient presents today for follow-up, her CT venogram was repeated and it showed residual nonocclusive thrombus involving the distal right transverse  and sigmoid sinus sinuses.,  Persistent but somewhat improved in appearance as compared to previous exam.  No new dural sinus thrombosis.  She remains on Eliquis  5 mg twice daily.  She did follow-up with primary care doctor and hematology.  She is pending a complete hematology work-up.  Currently she is complaining of left shoulder arm pain, some migraine headaches.  She ran out of some of her medications more than a month ago and have not refill it.    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  She was initiated on anticoagulation.  Neurosurgery was consulted but patient did not have a craniectomy.  She developed seizures, started on Keppra   Her symptoms improve, she was discharge to acute rehab and her plan was to continue with anticoagulation for at least 6 months.  Patient report prior to her hospitalization she has been doing well, she has chronic anemia, sickle cell disease but she was not taken on any medication.  She reports a few weeks prior to presentation she developed vaginal bleeding and she was started on megestrol  acetate.  Since discharge from the hospital, daughter reported improvement of her mental status, confusion improved, she does continue with physical therapy and she is getting more independent.  She is compliant with all medications.  Patient reports prior to her hospitalization she was not taking any medication but since discharge she is on 13 medications, she feels like she does not need all of them.   Hospital course  below. Haja Crego is a 53 y.o. female with history of SS trait, uterine fibroid, abnormal vaginal bleeding x4 months who was admitted on 09/10/2021 with 3-day history of malaise, fall and progressive somnolence.  She was found to have RLL aspiration pneumonia as well as IPH due to right hemorrhagic infarct with edema and right frontal, parietal, occipital and temporal lobes and 9 mm right to left midline shift.  She was started on hypertonic saline and EEG done showing evidence of epileptogenicity with high potential for seizures.  Work-up also revealed  subocclusive clot in right distal right transverse sinus and right sigmoid dural venous sinus and she was started on IV heparin .  Dr. Jerri felt that patient with large right frontoparietal venous infarct with hemorrhagic conversion due to unclear etiology.  Neurosurgery was consulted due to concerns about the herniation and recommended conservative care with serial CCT for monitoring. She was loaded with Dilantin  due to concerns of seizure and placed on long-term EEG.  Dilantin  was discontinued and Keppra  added on 02/26.  Hypertonic saline was weaned off and mentation was slowly improving.  Repeat CTA/V of 02/27 showed evolving hemorrhagic infarct with mild improvement in mass effect and improved nonocclusive thrombus in right distal transverse and sigmoid sinus.  She was transition to DOAC on 02/28 with neurology recommending 3 to 6 months of AC as well as close neurological follow-up.  Hospital course was significant for issues with headaches, hypotension, tachycardia, left-sided weakness with balance deficits as well as cognitive deficits and disorientation.  CIR was recommended due to functional decline.    OTHER MEDICAL CONDITIONS: SS Trait, anemia    REVIEW OF SYSTEMS: Full 14 system review of systems performed and negative with exception of: as noted in the HPI   ALLERGIES: Allergies  Allergen Reactions   Megestrol  Other (See Comments) and  Swelling    Caused blood clot in brain  Caused blood clot in brain    Caused blood clot in brain Caused blood clot in brain   Qulipta  [Atogepant ] Anxiety    HALLUCINATION    HOME MEDICATIONS: Outpatient Medications Prior to Visit  Medication Sig Dispense Refill   albuterol  (VENTOLIN  HFA) 108 (90 Base) MCG/ACT inhaler Inhale 2 puffs into the lungs every 6 (six) hours as needed for wheezing or shortness of breath. 18 g 3   aspirin  EC 81 MG tablet Take 1 tablet (81 mg total) by mouth daily. Swallow whole. 90 tablet 3   cyclobenzaprine  (FLEXERIL ) 10 MG tablet Take 1 tablet (10 mg total) by mouth 3 (three) times daily as needed for muscle spasms. 30 tablet 3   ibuprofen  (ADVIL ) 200 MG tablet Take 200 mg by mouth every 6 (six) hours as needed.     levETIRAcetam  (KEPPRA ) 750 MG tablet Take 1 tablet (750 mg total) by mouth 2 (two) times daily. 60 tablet 0   pantoprazole  (PROTONIX ) 40 MG tablet Take 1 tablet (40 mg total) by mouth daily. 30 tablet 0   LamoTRIgine  (LAMICTAL  XR) 25 MG TB24 24 hour tablet Take 1 tablet (25 mg total) by mouth daily for 7 days, THEN 2 tablets (50 mg total) daily for 7 days, THEN 4 tablets (100 mg total) daily for 14 days, THEN 6 tablets (150 mg total) daily for 14 days. 161 tablet 0   LamoTRIgine  200 MG TB24 24 hour tablet Take 1 tablet (200 mg total) by mouth daily. 90 tablet 3   gabapentin  (NEURONTIN ) 300 MG capsule Take 1 capsule (300 mg total) by mouth at bedtime. (Patient not taking: Reported on 05/25/2024) 90 capsule 3   PARoxetine  (PAXIL ) 20 MG tablet Take 1 tablet (20 mg total) by mouth daily. (Patient not taking: Reported on 05/25/2024) 90 tablet 3   propranolol  ER (INDERAL  LA) 60 MG 24 hr capsule Take 1 capsule (60 mg total) by mouth daily. (Patient not taking: Reported on 05/25/2024) 90 capsule 3   No facility-administered medications prior to visit.    PAST MEDICAL HISTORY: Past Medical History:  Diagnosis Date   Asthma    Stroke (HCC)  Vaginal  bleeding, abnormal    for 4 months    PAST SURGICAL HISTORY: Past Surgical History:  Procedure Laterality Date   FOOT SURGERY     TUBAL LIGATION      FAMILY HISTORY: Family History  Problem Relation Age of Onset   Clotting disorder Mother     SOCIAL HISTORY: Social History   Socioeconomic History   Marital status: Single    Spouse name: Not on file   Number of children: 4   Years of education: Not on file   Highest education level: Not on file  Occupational History   Not on file  Tobacco Use   Smoking status: Never   Smokeless tobacco: Never  Substance and Sexual Activity   Alcohol use: Not Currently   Drug use: Never   Sexual activity: Not Currently  Other Topics Concern   Not on file  Social History Narrative      Right handed   Caffeine  ocassional   Lives alone with dog   Social Drivers of Health   Financial Resource Strain: Not on file  Food Insecurity: No Food Insecurity (04/07/2024)   Hunger Vital Sign    Worried About Running Out of Food in the Last Year: Never true    Ran Out of Food in the Last Year: Never true  Transportation Needs: No Transportation Needs (04/07/2024)   PRAPARE - Administrator, Civil Service (Medical): No    Lack of Transportation (Non-Medical): No  Physical Activity: Not on file  Stress: Low Risk (02/27/2020)   Received from [facility]   Stress    Stress Risk: Not on file    General Anxiety Disorder (GAD-7) Score: Not on file  Social Connections: Not on file  Intimate Partner Violence: Not At Risk (04/07/2024)   Humiliation, Afraid, Rape, and Kick questionnaire    Fear of Current or Ex-Partner: No    Emotionally Abused: No    Physically Abused: No    Sexually Abused: No    PHYSICAL EXAM  GENERAL EXAM/CONSTITUTIONAL: Vitals:  Vitals:   05/25/24 1450  BP: (!) 173/44  Pulse: 69  Weight: 170 lb (77.1 kg)  Height: 4' 11 (1.499 m)      Body mass index is 34.34 kg/m. Wt Readings from Last 3  Encounters:  05/25/24 170 lb (77.1 kg)  04/15/24 168 lb (76.2 kg)  04/07/24 168 lb (76.2 kg)   Patient is in no distress; well developed, nourished and groomed; neck is supple  MUSCULOSKELETAL: Gait, strength, tone, movements noted in Neurologic exam below  NEUROLOGIC: MENTAL STATUS:      No data to display         awake, alert, oriented to person, place and time Difficulty with recent memory, has to write thing down  normal attention and concentration language fluent, comprehension intact, naming intact fund of knowledge appropriate  CRANIAL NERVE:  2nd, 3rd, 4th, 6th - Left visual field cut, extraocular muscles intact, no nystagmus 5th - facial sensation symmetric 7th - facial strength symmetric 8th - hearing intact 9th - palate elevates symmetrically, uvula midline 11th - shoulder shrug symmetric 12th - tongue protrusion midline  MOTOR:  normal bulk and tone, full strength in the BUE, BLE  SENSORY:  normal and symmetric to light touch  COORDINATION:  finger-nose-finger, fine finger movements normal  GAIT/STATION:  normal   DIAGNOSTIC DATA (LABS, IMAGING, TESTING) - I reviewed patient records, labs, notes, testing and imaging myself where available.  Lab Results  Component  Value Date   WBC 10.4 04/07/2024   HGB 13.3 04/07/2024   HCT 39.0 04/07/2024   MCV 84.6 04/07/2024   PLT 278 04/07/2024      Component Value Date/Time   NA 140 04/07/2024 0111   K 3.5 04/07/2024 0111   CL 105 04/07/2024 0111   CO2 21 (L) 04/06/2024 2327   GLUCOSE 122 (H) 04/07/2024 0111   BUN 7 04/07/2024 0111   CREATININE 0.90 04/07/2024 0111   CALCIUM  10.1 04/06/2024 2327   PROT 7.8 04/06/2024 2327   ALBUMIN 4.3 04/06/2024 2327   AST 22 04/06/2024 2327   ALT 15 04/06/2024 2327   ALKPHOS 119 04/06/2024 2327   BILITOT 0.6 04/06/2024 2327   GFRNONAA >60 04/06/2024 2327   Lab Results  Component Value Date   CHOL 139 09/11/2021   HDL 46 09/11/2021   LDLCALC 84 09/11/2021    TRIG 47 09/11/2021   CHOLHDL 3.0 09/11/2021   Lab Results  Component Value Date   HGBA1C 5.3 09/11/2021   Lab Results  Component Value Date   VITAMINB12 364 09/11/2021   Lab Results  Component Value Date   TSH 0.34 (A) 10/01/2023    CT Venogram 11/28/2021 1. 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 Previously seen blood products have resolved. 2. Residual nonocclusive thrombus involving the distal right transverse and sigmoid sinuses, persistent but somewhat improved in appearance as compared to previous exam. No new dural sinus thrombosis. 3. No other new acute intracranial abnormality.  CT Venogram 09/17/21. Evolving hemorrhagic infarction primarily involving temporal and occipital lobes with extension into the parietal lobe. No new hemorrhage. Mass effect is stable to mildly improved. Persistent but improved nonocclusive thrombus within the right distal transverse and sigmoid sinuses   Head CT 09/10/21 Large region of cortical/subcortical edema within the right frontal, parietal, occipital and temporal lobes, as well as right insula and subinsular region. Superimposed patchy foci of acute parenchymal hemorrhage within the right parietal, occipital and temporal lobes. These findings are favored to reflect an acute/early subacute infarct from arterial ischemia (with hemorrhagic conversion) or a hemorrhagic infarct due to intracranial venous thrombosis. An MRI of the brain with contrast (including thin-slice T1-weighted post-contrast imaging for assessment of the intracranial venous structures) is recommended for further evaluation. A CTA of the head/neck should also be considered.   Associated mass effect with significant partial effacement of the right lateral ventricle and 9 mm leftward midline shift. The basal cisterns are effaced. Neurosurgical consultation is advised.   Moderate-volume extension  of hemorrhage into the right lateral ventricle is questioned. No definite evidence of hydrocephalus or ventricular entrapment at this time   MRI Brain 09/10/21 1. Large area of cortical infarction in the right temporal, occipital, parietal, and posterior frontal lobes, with significant associated edema and redemonstrated intraparenchymal hemorrhage. The mass effect causes 11 mm of right-to-left midline shift, medial displacement of the right uncus and right temporal horn and effacement of the basal cisterns, unchanged from the prior CT and raising concern for right uncal herniation. 2. Additional focal area of restricted diffusion in the medial aspect of the right basal ganglia, suspected to be in the preoptic area of the hypothalamus, likely sequela of aforementioned mass effect. 3. Hemorrhage layering in the occipital horns of the lateral ventricles, with redemonstrated effacement of the right lateral and third ventricle. No evidence of hydrocephalus. 4. Small amount of subdural hemorrhage along the right frontal lobe, measuring up to 4  mm. 5. Filling defect in the distal right transverse sinus and proximal and mid right sigmoid sinus, consistent with thrombus, as seen on the same-day CTV.   LTM EEG 09/11/21 - Sharp waves,  right hemisphere, maximal right parieto-occipital region  - Continuous slow, generalized and lateralized right hemisphere   Routine EEG 02/17/2023 - Right frontotemporal slowing    ASSESSMENT AND PLAN  53 y.o. 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  Patient did have subsequent seizures and brain edema but no seizures since leaving the hospital in February 2023.  She has been anticoagulated initially on heparin  and DOAC and now off anticoagulation and on Aspirin  alone. She understands the Aspirin  is lifelong.   She is  currently on lamotrigine  XR 200 mg daily and Keppra  750 mg twice daily.  Plan will be to obtain a lamotrigine  level and if in range we will plan to reduce Keppra  with plan of discontinuing.  I will contact her to go over the result and next plans.  I will see her as scheduled in January.  Her last seizure was on April 07, 2024   1. Breakthrough seizure (HCC)   2. Therapeutic drug monitoring   3. Cerebral venous sinus thrombosis   4. Visual field cut   5. Chronic tension-type headache, not intractable       Patient Instructions  Will obtain a lamotrigine  level and BMP If lamotrigine  level in range, plan will be to reduce Keppra  to 750 mg nightly for 2 weeks then further reduce to 375 mg nightly for 2 weeks then stop Please contact me if you have another breakthrough seizure or any other questions or concerns Return as scheduled in January   Orders Placed This Encounter  Procedures   Lamotrigine  level   Basic Metabolic Panel   Vitamin D, 25-hydroxy    Meds ordered this encounter  Medications   LamoTRIgine  200 MG TB24 24 hour tablet    Sig: Take 1 tablet (200 mg total) by mouth daily.    Dispense:  90 tablet    Refill:  3    Return in about 4 months (around 09/17/2024).  The patient's condition of seizure, cerebral venous thrombosis and stroke requires frequent monitoring and adjustments in the treatment plan, reflecting the ongoing complexity of care.  This provider is the continuing focal point for all needed services for this condition.   Pastor Falling, MD 05/25/2024, 5:47 PM  [facility] [address], [address] [place], [place] [address] [phone]

## 2024-05-27 ENCOUNTER — Ambulatory Visit (INDEPENDENT_AMBULATORY_CARE_PROVIDER_SITE_OTHER): Admitting: Clinical

## 2024-05-27 DIAGNOSIS — F064 Anxiety disorder due to known physiological condition: Secondary | ICD-10-CM

## 2024-05-27 DIAGNOSIS — F0631 Mood disorder due to known physiological condition with depressive features: Secondary | ICD-10-CM

## 2024-05-27 DIAGNOSIS — S069XAD Unspecified intracranial injury with loss of consciousness status unknown, subsequent encounter: Secondary | ICD-10-CM | POA: Diagnosis not present

## 2024-05-27 LAB — BASIC METABOLIC PANEL WITH GFR
BUN/Creatinine Ratio: 11 (ref 9–23)
BUN: 9 mg/dL (ref 6–24)
CO2: 23 mmol/L (ref 20–29)
Calcium: 10.1 mg/dL (ref 8.7–10.2)
Chloride: 101 mmol/L (ref 96–106)
Creatinine, Ser: 0.82 mg/dL (ref 0.57–1.00)
Glucose: 86 mg/dL (ref 70–99)
Potassium: 4.2 mmol/L (ref 3.5–5.2)
Sodium: 138 mmol/L (ref 134–144)
eGFR: 85 mL/min/1.73 (ref >59)

## 2024-05-27 LAB — VITAMIN D 25 HYDROXY (VIT D DEFICIENCY, FRACTURES): Vit D, 25-Hydroxy: 8 ng/mL — ABNORMAL LOW (ref 30.0–100.0)

## 2024-05-27 LAB — LAMOTRIGINE LEVEL: Lamotrigine Lvl: <1 ug/mL — AB (ref 2.0–20.0)

## 2024-05-28 ENCOUNTER — Ambulatory Visit: Payer: Self-pay | Admitting: Neurology

## 2024-05-28 DIAGNOSIS — G40919 Epilepsy, unspecified, intractable, without status epilepticus: Secondary | ICD-10-CM

## 2024-05-28 DIAGNOSIS — Z5181 Encounter for therapeutic drug level monitoring: Secondary | ICD-10-CM

## 2024-05-28 MED ORDER — VITAMIN D (ERGOCALCIFEROL) 1.25 MG (50000 UNIT) PO CAPS: 50000.0000 [IU] | ORAL_CAPSULE | ORAL | 0 refills | Status: DC

## 2024-05-29 ENCOUNTER — Encounter (HOSPITAL_COMMUNITY): Payer: Self-pay | Admitting: Clinical

## 2024-05-29 NOTE — Progress Notes (Signed)
 THERAPIST PROGRESS NOTE  Session Time:   4:08pm-5:08pm  Session #41  Participation Level: Active  Behavioral Response: Casual Alert Negative and Irritable   Type of Therapy: Individual Therapy   Treatment Goals addressed:  STG: Score less than 9 on the PHQ-9 and less than 5 on the GAD-7 as evidenced by intermittent administration of the questionnaires to determine progress in managing depression and anxiety. STG: Explore personal core beliefs, rules and assumptions, and cognitive distortions through therapist using Cognitive Behavioral Therapy; learn about replacement thoughts, Behavioral Activation and Acting As If AEB using 2 times weekly.  STG: Process life events to the extent needed so that will be able to move forward with various areas of life in a better frame of mind per self-report of improved satisfaction with life 5 out of 7 days over the next 6 months.  STG: Learn about boundary styles and types, how to implement them, and how to enforce them, then report feeling more empowered and content with being able to maintain more helpful, appropriate boundaries in the future for a more balanced result. LTG: April Reilly will be able to acknowledge and accept her new way of life with the brain injury 5 days out of 7  STG: Learn breathing techniques and grounding techniques at an age-appropriate and ability-appropriate level and demonstrate mastery in session then report independent use of these skills out of session.  LTG: April Reilly will be able to identify at least 2 reasons her brain injury can cause depression symptoms, recognize when this is happening, and become able to verbalize that this is not something she can control. [facility]:Ozjmw and practice communication techniques such as active listening, I statements, open-ended questions, reflective listening, assertiveness, fair fighting rules, initiating conversations, and more as necessary and taught in session. STG: April Reilly will be able to talk  about her brain injury without becoming overwhelmed with negative emotion and/or agitated 3 out of 4 sessions with therapist.  ProgressTowards Goals: Progressing  Interventions: Assertiveness Training, Supportive, and Other: detachment    Summary: April Reilly is a 53 y.o. 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  She presented oriented x5 and stated she was feeling emotional lately, angry, irritable, depressed.  CSW evaluated patient's medication compliance, use of coping tools, and self-care, as applicable.  She provided an update on various aspects of her life that are normally discussed in therapy, including problems in relationships with and between daughters and her desire to get a job despite doctor saying she should not try to work.  She explained and we processed a number of incidents that have occurred to create friction among the daughters as well as with her, including ongoing problems with the daughter and grandson who are living with patient.  CSW explained the idea of detachment and reviewed how this can be applied to her children.  We also explored her idea of returning to work.  Suicidal/Homicidal: No without intent/plan  Therapist Response:   Patient is progressing AEB engaging in scheduled therapy session.  Throughout the session, CSW gave patient the opportunity to explore thoughts and feelings associated with current life situations and past/present stressors.   CSW challenged patient gently and appropriately to consider different ways of looking at reported issues. CSW encouraged patient's expression of feelings and validated these using empathy, active listening, open body language, and unconditional positive regard.        Plan/Recommendations:  Return to therapy in  2 weeks to next scheduled appointment on 11/19, reflect on what was discussed in session, engage in self care  behaviors as explored in session, do homework as assigned (think about boundaries and detachment as explained in session), and return to next session prepared to talk about experience with new coping methods.   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 Patient/Guardian was advised if they have not already done so to contact the registration department to sign all necessary forms in order for us  to release information regarding their care.   Consent: Patient/Guardian gives verbal consent for treatment and assignment of benefits for services provided during this visit. Patient/Guardian expressed understanding and agreed to proceed.   April JINNY Crest, LCSW 05/29/2024

## 2024-06-03 ENCOUNTER — Other Ambulatory Visit: Payer: Self-pay

## 2024-06-03 ENCOUNTER — Other Ambulatory Visit (INDEPENDENT_AMBULATORY_CARE_PROVIDER_SITE_OTHER): Payer: Self-pay

## 2024-06-03 DIAGNOSIS — G40919 Epilepsy, unspecified, intractable, without status epilepticus: Secondary | ICD-10-CM | POA: Diagnosis not present

## 2024-06-03 DIAGNOSIS — Z0289 Encounter for other administrative examinations: Secondary | ICD-10-CM

## 2024-06-03 DIAGNOSIS — Z5181 Encounter for therapeutic drug level monitoring: Secondary | ICD-10-CM

## 2024-06-05 ENCOUNTER — Ambulatory Visit: Payer: Self-pay | Admitting: Neurology

## 2024-06-05 LAB — LAMOTRIGINE LEVEL: Lamotrigine Lvl: 12 ug/mL (ref 2.0–20.0)

## 2024-06-09 ENCOUNTER — Encounter (HOSPITAL_COMMUNITY): Payer: Self-pay | Admitting: Clinical

## 2024-06-09 ENCOUNTER — Ambulatory Visit (INDEPENDENT_AMBULATORY_CARE_PROVIDER_SITE_OTHER): Admitting: Clinical

## 2024-06-09 DIAGNOSIS — F064 Anxiety disorder due to known physiological condition: Secondary | ICD-10-CM

## 2024-06-09 DIAGNOSIS — F0631 Mood disorder due to known physiological condition with depressive features: Secondary | ICD-10-CM

## 2024-06-09 NOTE — Progress Notes (Unsigned)
 THERAPIST PROGRESS NOTE  Session Time:   4:10pm-5:00pm  Session #42  Virtual Visit via Video Note  I connected with April Reilly on 06/09/24 at  4:00 PM EST by a video enabled telemedicine application and verified that I am speaking with the correct person using two identifiers.  Location: Patient: home Provider: [facility] outpatient therapy office - [place]    I discussed the limitations of evaluation and management by telemedicine and the availability of in person appointments. The patient expressed understanding and agreed to proceed.   I discussed the assessment and treatment plan with the patient. The patient was provided an opportunity to ask questions and all were answered. The patient agreed with the plan and demonstrated an understanding of the instructions.   The patient was advised to call back or seek an in-person evaluation if the symptoms worsen or if the condition fails to improve as anticipated.  I provided 50 minutes of non-face-to-face time during this encounter.  April JINNY Crest, LCSW   Participation Level: Active  Behavioral Response: Casual Alert Irritable   Type of Therapy: Individual Therapy   Treatment Goals addressed:  STG: Score less than 9 on the PHQ-9 and less than 5 on the GAD-7 as evidenced by intermittent administration of the questionnaires to determine progress in managing depression and anxiety. STG: Explore personal core beliefs, rules and assumptions, and cognitive distortions through therapist using Cognitive Behavioral Therapy; learn about replacement thoughts, Behavioral Activation and Acting As If AEB using 2 times weekly.  STG: Process life events to the extent needed so that will be able to move forward with various areas of life in a better frame of mind per self-report of improved satisfaction with life 5 out of 7 days over the next 6 months.  STG: Learn about boundary styles and types, how to implement them, and how to enforce them,  then report feeling more empowered and content with being able to maintain more helpful, appropriate boundaries in the future for a more balanced result. LTG: [name] will be able to acknowledge and accept her new way of life with the brain injury 5 days out of 7  STG: Learn breathing techniques and grounding techniques at an age-appropriate and ability-appropriate level and demonstrate mastery in session then report independent use of these skills out of session.  LTG: [name] will be able to identify at least 2 reasons her brain injury can cause depression symptoms, recognize when this is happening, and become able to verbalize that this is not something she can control. O[facility]:Ozjmw and practice communication techniques such as active listening, I statements, open-ended questions, reflective listening, assertiveness, fair fighting rules, initiating conversations, and more as necessary and taught in session. STG: [name] will be able to talk about her brain injury without becoming overwhelmed with negative emotion and/or agitated 3 out of 4 sessions with therapist.  ProgressTowards Goals: Progressing  Interventions: Solution Focused and Other: Processing    Summary: April Reilly is a 53 y.o. 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  She presented oriented x5 and stated she was feeling irritated.  CSW evaluated patient's medication compliance, use of coping tools, and self-care, as applicable.  She provided an update on various aspects of her life that are normally discussed in therapy, including her thought that her favorite doctor in [place] is angry with her, her ongoing problems with daughter and grandson who live with her currently, and her  desire to get a job even if it means loss of her disability check.  The lack of consideration from 3 out of 4 of her daughters disturbs her greatly.  She was planning  to have another talk with the one living with her this evening.  She has already told that daughter that she and her son have to move out as soon as she gets her first paycheck and ability to pay for a place.  It is the lack of disposable income that is inducing the patient to want to return to the workplace, even though her neurologist has said she cannot do that.  Her daughter continues to refuse to provide any funds to cover their stay, although her bills have gone up considerably with them in the home using more water , electricity, and such.  CSW helped her to remember various reasons working could be difficult and also reminded her that if she still intends to join her boyfriend in [place]  next year, they would then have 2 incomes for bills so she would not be in the dire straits she is in currently.  CSW suggested that she consider a part-time job to start and asked her to take her time to consider all sides of the issue and reminded her that part of her brain injury has been making decision-making a challenge, particularly with mood swings.  Suicidal/Homicidal: No without intent/plan  Therapist Response:   Patient is progressing AEB engaging in scheduled therapy session.  Throughout the session, CSW gave patient the opportunity to explore thoughts and feelings associated with current life situations and past/present stressors.   CSW challenged patient gently and appropriately to consider different ways of looking at reported issues. CSW encouraged patient's expression of feelings and validated these using empathy, active listening, open body language, and unconditional positive regard.        Plan/Recommendations:  Return to therapy in 2 weeks to next scheduled appointment on 12/4, reflect on what was discussed in session, engage in self care behaviors as explored in session, do homework as assigned (take time making decision about returning to work), and return to next session prepared to talk about  experience with new coping methods.   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 Patient/Guardian was advised if they have not already done so to contact the registration department to sign all necessary forms in order for us  to release information regarding their care.   Consent: Patient/Guardian gives verbal consent for treatment and assignment of benefits for services provided during this visit. Patient/Guardian expressed understanding and agreed to proceed.   April JINNY Crest, LCSW 06/09/2024

## 2024-06-10 ENCOUNTER — Ambulatory Visit (HOSPITAL_COMMUNITY): Admitting: Clinical

## 2024-06-24 ENCOUNTER — Ambulatory Visit (INDEPENDENT_AMBULATORY_CARE_PROVIDER_SITE_OTHER): Admitting: Clinical

## 2024-06-24 DIAGNOSIS — F0631 Mood disorder due to known physiological condition with depressive features: Secondary | ICD-10-CM | POA: Diagnosis not present

## 2024-06-24 DIAGNOSIS — S069XAD Unspecified intracranial injury with loss of consciousness status unknown, subsequent encounter: Secondary | ICD-10-CM

## 2024-06-24 DIAGNOSIS — F064 Anxiety disorder due to known physiological condition: Secondary | ICD-10-CM | POA: Diagnosis not present

## 2024-06-24 NOTE — Progress Notes (Unsigned)
 THERAPIST PROGRESS NOTE  Session Time:   4:05pm-5:05pm  Session #43  Participation Level: Active  Behavioral Response: Casual Alert Irritable   Type of Therapy: Individual Therapy   Treatment Goals addressed:  STG: Score less than 9 on the PHQ-9 and less than 5 on the GAD-7 as evidenced by intermittent administration of the questionnaires to determine progress in managing depression and anxiety. STG: Explore personal core beliefs, rules and assumptions, and cognitive distortions through therapist using Cognitive Behavioral Therapy; learn about replacement thoughts, Behavioral Activation and Acting As If AEB using 2 times weekly.  STG: Process life events to the extent needed so that will be able to move forward with various areas of life in a better frame of mind per self-report of improved satisfaction with life 5 out of 7 days over the next 6 months.  STG: Learn about boundary styles and types, how to implement them, and how to enforce them, then report feeling more empowered and content with being able to maintain more helpful, appropriate boundaries in the future for a more balanced result. LTG: Tahnee will be able to acknowledge and accept her new way of life with the brain injury 5 days out of 7  STG: Learn breathing techniques and grounding techniques at an age-appropriate and ability-appropriate level and demonstrate mastery in session then report independent use of these skills out of session.  LTG: Chantille will be able to identify at least 2 reasons her brain injury can cause depression symptoms, recognize when this is happening, and become able to verbalize that this is not something she can control. O[facility]:Ozjmw and practice communication techniques such as active listening, I statements, open-ended questions, reflective listening, assertiveness, fair fighting rules, initiating conversations, and more as necessary and taught in session. STG: Tamyra will be able to talk about her  brain injury without becoming overwhelmed with negative emotion and/or agitated 3 out of 4 sessions with therapist.  ProgressTowards Goals: Progressing  Interventions: Solution Focused and Supportive    Summary: April Reilly is a 53 y.o. 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  She presented oriented x5 and stated she was feeling content.  CSW evaluated patient's medication compliance, use of coping tools, and self-care, as applicable.  She provided an update on various aspects of her life that are normally discussed in therapy, including how Thanksgiving turned out that she cooked for someone then went home to bed with swollen ankles.  She expressed that she could say she felt content because she is pulling away from her family and their various demands.  She was able to get her gas turned back on because daughter and grandson wanted to be able to take hot showers at home, so daughter gave her half the money.  She specifically told daughter this was not going to be a loan, as that has ended up being the case with everything else daughter has given her money toward during the year plus that they have lived with patient.  CSW asked her to think about how she is handling things differently than when she first got out of the hospital after her strokes.  She stated she cried a lot back then and was over-emotional.  One way in which she was proud to say she has changed is that she is saying No to her daughters when she does not want something, rather than saying yes and resenting it.  She is  upset that another daughter is paying someone else to put on a baby shower for her, just as her oldest daughter did -- upsetting her because she feels it puts her back into that time just after her hospital stay when they had to help her so much and she could not do as much as she can do now.  She also expressed  significant anger at doctor/neurologist because he wants to take her of Keppra  because of her mood swings.  He cited her daughter's opinion and this continues to irritate patient.  CSW helped patient to reframe her thinking about this and challenged her thinking about daughters' intentions.  Suicidal/Homicidal: No without intent/plan  Therapist Response:   Patient is progressing AEB engaging in scheduled therapy session.  Throughout the session, CSW gave patient the opportunity to explore thoughts and feelings associated with current life situations and past/present stressors.   CSW challenged patient gently and appropriately to consider different ways of looking at reported issues. CSW encouraged patient's expression of feelings and validated these using empathy, active listening, open body language, and unconditional positive regard.        Plan/Recommendations:  Return to therapy in 2 weeks to next scheduled appointment on 12/18, reflect on what was discussed in session, engage in self care behaviors as explored in session, do homework as assigned (use phrase Help me to understand with daughters, continue setting boundaries that make her happy rather than focusing on making others happy), and return to next session prepared to talk about experience with new coping methods.   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 Patient/Guardian was advised if they have not already done so to contact the registration department to sign all necessary forms in order for us  to release information regarding their care.   Consent:  Patient/Guardian gives verbal consent for treatment and assignment of benefits for services provided during this visit. Patient/Guardian expressed understanding and agreed to proceed.   April JINNY Crest, LCSW 06/29/2024

## 2024-06-29 ENCOUNTER — Encounter (HOSPITAL_COMMUNITY): Payer: Self-pay | Admitting: Clinical

## 2024-07-08 ENCOUNTER — Ambulatory Visit (INDEPENDENT_AMBULATORY_CARE_PROVIDER_SITE_OTHER): Admitting: Clinical

## 2024-07-08 DIAGNOSIS — F064 Anxiety disorder due to known physiological condition: Secondary | ICD-10-CM

## 2024-07-08 DIAGNOSIS — S069XAD Unspecified intracranial injury with loss of consciousness status unknown, subsequent encounter: Secondary | ICD-10-CM | POA: Diagnosis not present

## 2024-07-08 DIAGNOSIS — F0631 Mood disorder due to known physiological condition with depressive features: Secondary | ICD-10-CM | POA: Diagnosis not present

## 2024-07-13 ENCOUNTER — Encounter (HOSPITAL_COMMUNITY): Payer: Self-pay | Admitting: Clinical
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
## 2024-07-27 ENCOUNTER — Ambulatory Visit: Admitting: Neurology

## 2024-07-27 ENCOUNTER — Encounter: Payer: Self-pay | Admitting: Neurology

## 2024-07-27 VITALS — BP 119/79 | HR 101 | Ht 59.0 in | Wt 168.0 lb

## 2024-07-27 DIAGNOSIS — G44229 Chronic tension-type headache, not intractable: Secondary | ICD-10-CM

## 2024-07-27 DIAGNOSIS — H5340 Unspecified visual field defects: Secondary | ICD-10-CM

## 2024-07-27 DIAGNOSIS — F419 Anxiety disorder, unspecified: Secondary | ICD-10-CM

## 2024-07-27 DIAGNOSIS — F32A Depression, unspecified: Secondary | ICD-10-CM | POA: Diagnosis not present

## 2024-07-27 DIAGNOSIS — G40919 Epilepsy, unspecified, intractable, without status epilepticus: Secondary | ICD-10-CM

## 2024-07-27 DIAGNOSIS — G08 Intracranial and intraspinal phlebitis and thrombophlebitis: Secondary | ICD-10-CM | POA: Diagnosis not present

## 2024-07-27 NOTE — Progress Notes (Signed)
 "   [facility]  PATIENT: April Reilly DOB: [birth year]  REQUESTING CLINICIAN: Paseda, Folashade R, FNP HISTORY FROM: Patient REASON FOR VISIT: Headaches follow up   HISTORICAL  CHIEF COMPLAINT:  Chief Complaint  Patient presents with   Follow-up    Room 13 Alone Breakthrough seizure- last 9/25    INTERVAL HISTORY 07/27/2024 April Reilly presents today for follow up. Last visit was in November. At that time she was on both Lamotrigine  and Levetiracetam . She denies any seizure or seizure like activity. We had plan to reduce and discontinue Levetiracetam  but she would like to continue on both medications at the same dose since they have been helpful.   INTERVAL HISTORY 05/25/2024 April Reilly presents today for follow-up, last visit was in September at that time we added lamotrigine  with the goal of switching Keppra  to lamotrigine .  She has completed titration, and currently taking lamotrigine  XR 200 mg daily.  Denies any side effect from the medication.  She is still taking the Keppra  and reports some somnolence and some mood swings.  Denies any seizure or seizure-like activity, tells me that her headaches are better. She had also self discontinued her Paxil  and propranolol    INTERVAL HISTORY 04/15/2024 Patient presents today for follow-up, last visit was in June at that time we continued her medications including lamotrigine , propranolol  and Paxil .  Unfortunately on September 17 she was admitted to the hospital due to breakthrough seizure.  She had a couple seizures at home with tongue biting and urinary incontinence.  She tells us  that she self discontinued her lamotrigine  for about 6 weeks prior to her seizure.  She did not call us  to inform us .  At discharge from the hospital, she was recommended Keppra  due to lamotrigine  needing another taper.  She is currently on Keppra  750 mg twice daily, does not report any side effect but would like to go back on her previous dose of  lamotrigine  XR 200 mg daily.   INTERVAL HISTORY 01/09/2024 April Reilly presents today for follow-up, last visit was in April, at that time we continued her current medications and added propranolol  for headache prophylaxis.  Today she tells me she doing better, she has been traveling to [place]  recently.  Feels like her mood is better while she is in [place] , she does not experience any headaches but in [place]  she will have headaches once every couple days.  She also reports some increased stress related to her daughter and grand kid living with her.  No seizure or seizure like activity.  She still has to write things down in order to remember.   INTERVAL HISTORY 11/05/2023:  Patient presents today for follow-up, last visit was in January, since then she tells me that she is still having headaches, on average 2-3 headaches per week.  Headache can last the whole day.  Ibuprofen  help but she still having constant headache. Tells me that her anxiety is still a problem.  She lies in bed but cannot sleep for about 3 to 4 hours usually.  Fall asleep around 5 or 6 AM and wake up around 9 AM.  She remains on Paxil  Denies any seizure or seizure activity.  She is currently on lamotrigine  200 mg daily, Keppra  discontinued.   INTERVAL HISTORY 08/20/2023:  Patient presents today for follow-up, she is alone.  Last visit was in October, since then she has not had any seizure or seizure-like activity.  She is compliant with Keppra  500 mg twice daily but has side  effect of irritability but stated it is improving.  Still complaining of headaches, patient tells me that she is undergoing therapy to help with her anxiety and depression.  She has tried Ibuprofen  800 mg once and it was able to control her headaches. Currently her daughter and grandson are staying with her. Still having memory loss, uses a note book to keep track of her daily activity and keep important information.    INTERVAL HISTORY 04/22/2023:   Patient presents for follow-up, last visit was in July since then he has not had any seizure or seizure activity.  She is compliant with her medications including Keppra  500 mg twice daily and Flexeril .  She she is not sure if she is taking the amitriptyline  or not. We repeated her EEG which showed right hemispheric slowing, no epileptiform discharges and no seizures.    complaining of chronic headaches, we prescribed her Nurtec but it was not approved and patient started on Qulipta .  While taking the Qulipta  she developed new hallucinations therefore discontinued.  Since discontinuing the Qulipta , she has not had any similar symptoms.  Her headaches, occur every other day.  She reports that Flexeril  helps.  She still struggles with memory, reports that she wants to go back to work but unsure if she can actually hold a job because now she has right   INTERVAL HISTORY 01/28/2023:  Patient presents today for follow-up, she is alone.  Last visit was in January and since then she has travelled to [place]  and saw her PCP, had workup which was unrevealing.  She reports compliance with her medications, denies any seizure or seizure like activity.  She remains on the Keppra  500 mg twice daily.  She continues to complain of headaches, chronic headaches.  She has tried topiramate  and amitriptyline  without any benefit.  She continues on Motrin  as needed for the headaches.  Reports that her headaches are almost daily.  She still reports anxiety, feels like her anxiety is worse when she is home alone by herself, sometimes she hears a phone ring when no phone is ringing.  She reports a few weeks ago her sister came and stay with her and during that time, she did not have any hallucinations, did not have any symptom of anxiety.  While she was in [place]  also visiting family, she did not experience any hallucinations and no worsening anxiety while in the house but she will get anxious when she is in the crowd in  public.   INTERVAL HISTORY 07/30/2022:  Patient presents today for follow-up, at last visit in October plan was to discontinue Topamax , start Elavil  and Paxil .  Patient has not started those medications and she is still on Topamax .  She still complains of severe headaches about 2-3 times per week.  When she had these bad headaches, she takes muscle relaxant.  She also reports increase in anxiety, trouble falling asleep, patient's reported she is scare to sleep because she lives alone.  Lately she has been sleeping with a loaded gun on her bed. She still has not set up care with a primary care physician   INTERVAL HISTORY 05/07/2022:  Savannah presents today for follow-up, she is alone.  Since last visit she had an episode of severe vaginal bleeding, her Xarelto  was discontinued.  Since she has been anticoagulated for 8 months,  it was reasonable to discontinue Xarelto .  She is presenting today complaining of headache, at last visit we had increased her Topamax  to 100 mg nightly  but she still reports headache that usually start in the back of the neck.  She reported Flexeril  helps but she ran out of medication.  She also tried Fioricet  which helped her.  She denies any migrainous features, denies any nausea or vomiting.  She reported she is very anxious and worry about her brain injury, she cannot remember her seizure and worried about having another seizure.  Her vision is still affected, and wonder if she will get her vision back. She has not seen oph due to lack of insurance.    INTERVAL HISTORY 03/12/2022:  Patient presents today for follow-up, last visit was in June at that time plan was to continue with Eliquis  and obtain a repeat CT venogram.  She reported she ran out of Eliquis  and actually her insurance dropped her because of FMLA.  Luckily she did follow-up with Dr. Loretha, hematologist and patient was started on Xarelto .  She did have her CT venogram yesterday but pending official read.  She reports  that she still having occasional headaches that is relieved by muscle relaxant, her vision is still poor and she is pending a ophthalmology follow-up next week.  Denies any recent fall.  Denies any new focal weakness.     INTERVAL HISTORY 12/24/21:  Patient presents today for follow-up, her CT venogram was repeated and it showed residual nonocclusive thrombus involving the distal right transverse and sigmoid sinus sinuses.,  Persistent but somewhat improved in appearance as compared to previous exam.  No new dural sinus thrombosis.  She remains on Eliquis  5 mg twice daily.  She did follow-up with primary care doctor and hematology.  She is pending a complete hematology work-up.  Currently she is complaining of left shoulder arm pain, some migraine headaches.  She ran out of some of her medications more than a month ago and have not refill it.    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  She was initiated on anticoagulation.  Neurosurgery was consulted but patient did not have a craniectomy.  She developed seizures, started on Keppra   Her symptoms improve, she was discharge to acute rehab and her plan was to continue with anticoagulation for at least 6 months.  Patient report prior to her hospitalization she has been doing well, she has chronic anemia, sickle cell disease but she was not taken on any medication.  She reports a few weeks prior to presentation she developed vaginal bleeding and she was started on megestrol  acetate.  Since discharge from  the hospital, daughter reported improvement of her mental status, confusion improved, she does continue with physical therapy and she is getting more independent.  She is compliant with all medications.  Patient reports prior to her hospitalization she was not taking any medication but since discharge she is on 13 medications, she feels like she does not need all of them.   Hospital course below. Avaline Stillson is a 54 y.o. female with history of SS trait, uterine fibroid, abnormal vaginal bleeding x4 months who was admitted on 09/10/2021 with 3-day history of  malaise, fall and progressive somnolence.  She was found to have RLL aspiration pneumonia as well as IPH due to right hemorrhagic infarct with edema and right frontal, parietal, occipital and temporal lobes and 9 mm right to left midline shift.  She was started on hypertonic saline and EEG done showing evidence of epileptogenicity with high potential for seizures.  Work-up also revealed subocclusive clot in right distal right transverse sinus and right sigmoid dural venous sinus and she was started on IV heparin .  Dr. Jerri felt that patient with large right frontoparietal venous infarct with hemorrhagic conversion due to unclear etiology.  Neurosurgery was consulted due to concerns about the herniation and recommended conservative care with serial CCT for monitoring. She was loaded with Dilantin  due to concerns of seizure and placed on long-term EEG.  Dilantin  was discontinued and Keppra  added on 02/26.  Hypertonic saline was weaned off and mentation was slowly improving.  Repeat CTA/V of 02/27 showed evolving hemorrhagic infarct with mild improvement in mass effect and improved nonocclusive thrombus in right distal transverse and sigmoid sinus.  She was transition to DOAC on 02/28 with neurology recommending 3 to 6 months of AC as well as close neurological follow-up.  Hospital course was significant for issues with headaches, hypotension, tachycardia,  left-sided weakness with balance deficits as well as cognitive deficits and disorientation.  CIR was recommended due to functional decline.    OTHER MEDICAL CONDITIONS: SS Trait, anemia    REVIEW OF SYSTEMS: Full 14 system review of systems performed and negative with exception of: as noted in the HPI   ALLERGIES: Allergies  Allergen Reactions   Megestrol  Other (See Comments) and Swelling    Caused blood clot in brain  Caused blood clot in brain    Caused blood clot in brain Caused blood clot in brain   Qulipta  [Atogepant ] Anxiety    HALLUCINATION    HOME MEDICATIONS: Outpatient Medications Prior to Visit  Medication Sig Dispense Refill   albuterol  (VENTOLIN  HFA) 108 (90 Base) MCG/ACT inhaler Inhale 2 puffs into the lungs every 6 (six) hours as needed for wheezing or shortness of breath. 18 g 3   aspirin  EC 81 MG tablet Take 1 tablet (81 mg total) by mouth daily. Swallow whole. 90 tablet 3   cyclobenzaprine  (FLEXERIL ) 10 MG tablet Take 1 tablet (10 mg total) by mouth 3 (three) times daily as needed for muscle spasms. 30 tablet 3   gabapentin  (NEURONTIN ) 300 MG capsule Take 1 capsule (300 mg total) by mouth at bedtime. 90 capsule 3   ibuprofen  (ADVIL ) 200 MG tablet Take 200 mg by mouth every 6 (six) hours as needed.     LamoTRIgine  200 MG TB24 24 hour tablet Take 1 tablet (200 mg total) by mouth daily. 90 tablet 3   levETIRAcetam  (KEPPRA ) 750 MG tablet Take 1 tablet (750 mg total) by mouth 2 (two) times daily. 60 tablet 0   pantoprazole  (PROTONIX ) 40 MG tablet Take 1 tablet (40 mg total) by mouth daily. 30 tablet 0   PARoxetine  (PAXIL ) 20 MG tablet Take 1 tablet (20 mg total) by mouth daily. 90 tablet 3   propranolol  ER (INDERAL  LA) 60 MG 24 hr capsule Take 1 capsule (60 mg total) by mouth daily. 90 capsule 3   Vitamin D , Ergocalciferol , (DRISDOL ) 1.25 MG (50000 UNIT) CAPS capsule Take 1 capsule (50,000 Units total) by mouth every 7 (seven) days. 12 capsule 0   No  facility-administered medications prior to visit.    PAST  MEDICAL HISTORY: Past Medical History:  Diagnosis Date   Asthma    Stroke (HCC)    Vaginal bleeding, abnormal    for 4 months    PAST SURGICAL HISTORY: Past Surgical History:  Procedure Laterality Date   FOOT SURGERY     TUBAL LIGATION      FAMILY HISTORY: Family History  Problem Relation Age of Onset   Clotting disorder Mother     SOCIAL HISTORY: Social History   Socioeconomic History   Marital status: Single    Spouse name: Not on file   Number of children: 4   Years of education: Not on file   Highest education level: Not on file  Occupational History   Not on file  Tobacco Use   Smoking status: Never   Smokeless tobacco: Never  Substance and Sexual Activity   Alcohol use: Not Currently   Drug use: Never   Sexual activity: Not Currently  Other Topics Concern   Not on file  Social History Narrative      Right handed   Caffeine  ocassional   Lives alone with dog   Social Drivers of Health   Tobacco Use: Low Risk (07/27/2024)   Patient History    Smoking Tobacco Use: Never    Smokeless Tobacco Use: Never    Passive Exposure: Not on file  Financial Resource Strain: Not on file  Food Insecurity: No Food Insecurity (04/07/2024)   Epic    Worried About Programme Researcher, Broadcasting/film/video in the Last Year: Never true    Ran Out of Food in the Last Year: Never true  Transportation Needs: No Transportation Needs (04/07/2024)   Epic    Lack of Transportation (Medical): No    Lack of Transportation (Non-Medical): No  Physical Activity: Not on file  Stress: Not on file  Social Connections: Not on file  Intimate Partner Violence: Not At Risk (04/07/2024)   Epic    Fear of Current or Ex-Partner: No    Emotionally Abused: No    Physically Abused: No    Sexually Abused: No  Depression (PHQ2-9): High Risk (11/03/2023)   Depression (PHQ2-9)    PHQ-2 Score: 19  Alcohol Screen: Not on file  Housing: Low Risk (04/07/2024)    Epic    Unable to Pay for Housing in the Last Year: No    Number of Times Moved in the Last Year: 0    Homeless in the Last Year: No  Utilities: Not At Risk (04/07/2024)   Epic    Threatened with loss of utilities: No  Health Literacy: Not on file    PHYSICAL EXAM  GENERAL EXAM/CONSTITUTIONAL: Vitals:  Vitals:   07/27/24 1114  BP: 119/79  Pulse: (!) 101  SpO2: 90%  Weight: 168 lb (76.2 kg)  Height: 4' 11 (1.499 m)      Body mass index is 33.93 kg/m. Wt Readings from Last 3 Encounters:  07/27/24 168 lb (76.2 kg)  05/25/24 170 lb (77.1 kg)  04/15/24 168 lb (76.2 kg)   Patient is in no distress; well developed, nourished and groomed; neck is supple  MUSCULOSKELETAL: Gait, strength, tone, movements noted in Neurologic exam below  NEUROLOGIC: MENTAL STATUS:      No data to display         awake, alert, oriented to person, place and time Difficulty with recent memory, has to write thing down  normal attention and concentration language fluent, comprehension intact, naming intact fund of knowledge appropriate  CRANIAL NERVE:  2nd, 3rd, 4th, 6th - Left visual field cut, extraocular muscles intact, no nystagmus 5th - facial sensation symmetric 7th - facial strength symmetric 8th - hearing intact 9th - palate elevates symmetrically, uvula midline 11th - shoulder shrug symmetric 12th - tongue protrusion midline  MOTOR:  normal bulk and tone, full strength in the BUE, BLE  SENSORY:  normal and symmetric to light touch  COORDINATION:  finger-nose-finger, fine finger movements normal  GAIT/STATION:  normal   DIAGNOSTIC DATA (LABS, IMAGING, TESTING) - I reviewed patient records, labs, notes, testing and imaging myself where available.  Lab Results  Component Value Date   WBC 10.4 04/07/2024   HGB 13.3 04/07/2024   HCT 39.0 04/07/2024   MCV 84.6 04/07/2024   PLT 278 04/07/2024      Component Value Date/Time   NA 138 05/25/2024 1538   K 4.2  05/25/2024 1538   CL 101 05/25/2024 1538   CO2 23 05/25/2024 1538   GLUCOSE 86 05/25/2024 1538   GLUCOSE 122 (H) 04/07/2024 0111   BUN 9 05/25/2024 1538   CREATININE 0.82 05/25/2024 1538   CALCIUM  10.1 05/25/2024 1538   PROT 7.8 04/06/2024 2327   ALBUMIN 4.3 04/06/2024 2327   AST 22 04/06/2024 2327   ALT 15 04/06/2024 2327   ALKPHOS 119 04/06/2024 2327   BILITOT 0.6 04/06/2024 2327   GFRNONAA >60 04/06/2024 2327   Lab Results  Component Value Date   CHOL 139 09/11/2021   HDL 46 09/11/2021   LDLCALC 84 09/11/2021   TRIG 47 09/11/2021   CHOLHDL 3.0 09/11/2021   Lab Results  Component Value Date   HGBA1C 5.3 09/11/2021   Lab Results  Component Value Date   VITAMINB12 364 09/11/2021   Lab Results  Component Value Date   TSH 0.34 (A) 10/01/2023    CT Venogram 11/28/2021 1. 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 Previously seen blood products have resolved. 2. Residual nonocclusive thrombus involving the distal right transverse and sigmoid sinuses, persistent but somewhat improved in appearance as compared to previous exam. No new dural sinus thrombosis. 3. No other new acute intracranial abnormality.  CT Venogram 09/17/21. Evolving hemorrhagic infarction primarily involving temporal and occipital lobes with extension into the parietal lobe. No new hemorrhage. Mass effect is stable to mildly improved. Persistent but improved nonocclusive thrombus within the right distal transverse and sigmoid sinuses   Head CT 09/10/21 Large region of cortical/subcortical edema within the right frontal, parietal, occipital and temporal lobes, as well as right insula and subinsular region. Superimposed patchy foci of acute parenchymal hemorrhage within the right parietal, occipital and temporal lobes. These findings are favored to reflect an acute/early subacute infarct from arterial ischemia (with  hemorrhagic conversion) or a hemorrhagic infarct due to intracranial venous thrombosis. An MRI of the brain with contrast (including thin-slice T1-weighted post-contrast imaging for assessment of the intracranial venous structures) is recommended for further evaluation. A CTA of the head/neck should also be considered.   Associated mass effect with significant partial effacement of the right lateral ventricle and 9 mm leftward midline shift. The basal cisterns are effaced. Neurosurgical consultation is advised.   Moderate-volume extension of hemorrhage into the right lateral ventricle is questioned. No definite evidence of hydrocephalus or ventricular entrapment at this time   MRI Brain 09/10/21 1. Large area of cortical infarction in the right temporal, occipital, parietal, and posterior frontal lobes, with significant associated edema and redemonstrated intraparenchymal hemorrhage. The  mass effect causes 11 mm of right-to-left midline shift, medial displacement of the right uncus and right temporal horn and effacement of the basal cisterns, unchanged from the prior CT and raising concern for right uncal herniation. 2. Additional focal area of restricted diffusion in the medial aspect of the right basal ganglia, suspected to be in the preoptic area of the hypothalamus, likely sequela of aforementioned mass effect. 3. Hemorrhage layering in the occipital horns of the lateral ventricles, with redemonstrated effacement of the right lateral and third ventricle. No evidence of hydrocephalus. 4. Small amount of subdural hemorrhage along the right frontal lobe, measuring up to 4 mm. 5. Filling defect in the distal right transverse sinus and proximal and mid right sigmoid sinus, consistent with thrombus, as seen on the same-day CTV.   LTM EEG 09/11/21 - Sharp waves,  right hemisphere, maximal right parieto-occipital region  - Continuous slow, generalized and lateralized right hemisphere   Routine EEG  02/17/2023 - Right frontotemporal slowing    ASSESSMENT AND PLAN  54 y.o. 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  She has been anticoagulated initially on heparin  and DOAC and now off anticoagulation and on Aspirin  alone. She understands the Aspirin  is lifelong.   She is currently on lamotrigine  XR 200 mg daily and Keppra  750 mg twice daily. She is comfortable with both medications at this current dose and does not want to decrease Keppra . Will continue patient on both Lamotrigine  XR 200 mg daily and Levetiracetam  750 mg twice daily. I will see her schedule end of February. Her last seizure was on April 07, 2024   1. Breakthrough seizure (HCC)   2. Cerebral venous sinus thrombosis   3. Visual field cut   4. Chronic tension-type headache, not intractable   5. Anxiety and depression      Patient Instructions  Continue current medications for now, no changes  I will see you as scheduled end of February  Please call if you do have a breakthrough seizure.    No orders of the defined types were placed in this encounter.   No orders of the defined types were placed in this encounter.   Return in about 7 weeks (around 09/17/2024).  The patient's condition of seizure, cerebral venous thrombosis and stroke requires frequent monitoring and adjustments in the treatment plan, reflecting the ongoing complexity of care.  This provider is the continuing focal point for all needed services for this condition.   Pastor Falling, MD 07/27/2024, 12:28 PM  [facility] [address] [place], [place] [address] [phone] "

## 2024-07-27 NOTE — Patient Instructions (Signed)
 Continue current medications for now, no changes  I will see you as scheduled end of February  Please call if you do have a breakthrough seizure.

## 2024-07-30 ENCOUNTER — Other Ambulatory Visit (HOSPITAL_COMMUNITY): Payer: Self-pay

## 2024-07-30 ENCOUNTER — Other Ambulatory Visit: Payer: Self-pay | Admitting: Neurology

## 2024-07-30 ENCOUNTER — Telehealth: Payer: Self-pay | Admitting: Neurology

## 2024-07-30 MED ORDER — IBUPROFEN 200 MG PO TABS: 200.0000 mg | ORAL_TABLET | Freq: Four times a day (QID) | ORAL | 1 refills | Status: DC | PRN

## 2024-07-30 MED ORDER — CYCLOBENZAPRINE HCL 10 MG PO TABS
10.0000 mg | ORAL_TABLET | Freq: Three times a day (TID) | ORAL | 3 refills | Status: AC | PRN
  Filled 2024-07-30: qty 30, 10d supply, fill #0

## 2024-07-30 MED ORDER — CYCLOBENZAPRINE HCL 10 MG PO TABS: 10.0000 mg | ORAL_TABLET | Freq: Three times a day (TID) | ORAL | 3 refills | Status: DC | PRN

## 2024-07-30 MED ORDER — LEVETIRACETAM 750 MG PO TABS
750.0000 mg | ORAL_TABLET | Freq: Two times a day (BID) | ORAL | 4 refills | Status: AC
  Filled 2024-07-30: qty 180, 90d supply, fill #0

## 2024-07-30 MED ORDER — IBUPROFEN 200 MG PO TABS
200.0000 mg | ORAL_TABLET | Freq: Four times a day (QID) | ORAL | 1 refills | Status: AC | PRN
  Filled 2024-07-30: qty 30, 8d supply, fill #0

## 2024-07-30 MED ORDER — LEVETIRACETAM 750 MG PO TABS: 750.0000 mg | ORAL_TABLET | Freq: Two times a day (BID) | ORAL | 4 refills | Status: DC

## 2024-07-30 NOTE — Telephone Encounter (Signed)
 Patient request refill for ibuprofen  (ADVIL ) 200 MG tablet and cyclobenzaprine  (FLEXERIL ) 10 MG tablet. Advil  suppose to be 600 mg. Send to [facility]

## 2024-07-30 NOTE — Telephone Encounter (Signed)
 Meds sent. Thanks!

## 2024-08-03 ENCOUNTER — Ambulatory Visit (INDEPENDENT_AMBULATORY_CARE_PROVIDER_SITE_OTHER): Admitting: Clinical

## 2024-08-03 ENCOUNTER — Encounter (HOSPITAL_COMMUNITY): Payer: Self-pay | Admitting: Clinical

## 2024-08-03 DIAGNOSIS — S069XAD Unspecified intracranial injury with loss of consciousness status unknown, subsequent encounter: Secondary | ICD-10-CM

## 2024-08-03 DIAGNOSIS — F064 Anxiety disorder due to known physiological condition: Secondary | ICD-10-CM | POA: Diagnosis not present

## 2024-08-03 DIAGNOSIS — F0631 Mood disorder due to known physiological condition with depressive features: Secondary | ICD-10-CM

## 2024-08-03 NOTE — Progress Notes (Unsigned)
 THERAPIST PROGRESS NOTE  Session Time:   4:02pm-5:01pm  Session #45  Participation Level: Active  Behavioral Response: Casual Alert Negative and Irritable   Type of Therapy: Individual Therapy   Treatment Goals addressed:  STG: Score less than 9 on the PHQ-9 and less than 5 on the GAD-7 as evidenced by intermittent administration of the questionnaires to determine progress in managing depression and anxiety. STG: Explore personal core beliefs, rules and assumptions, and cognitive distortions through therapist using Cognitive Behavioral Therapy; learn about replacement thoughts, Behavioral Activation and Acting As If AEB using 2 times weekly.  STG: Process life events to the extent needed so that will be able to move forward with various areas of life in a better frame of mind per self-report of improved satisfaction with life 5 out of 7 days over the next 6 months.  STG: Learn about boundary styles and types, how to implement them, and how to enforce them, then report feeling more empowered and content with being able to maintain more helpful, appropriate boundaries in the future for a more balanced result. LTG: Ardine will be able to acknowledge and accept her new way of life with the brain injury 5 days out of 7  STG: Learn breathing techniques and grounding techniques at an age-appropriate and ability-appropriate level and demonstrate mastery in session then report independent use of these skills out of session.  LTG: Julien will be able to identify at least 2 reasons her brain injury can cause depression symptoms, recognize when this is happening, and become able to verbalize that this is not something she can control. [facility]:Ozjmw and practice communication techniques such as active listening, I statements, open-ended questions, reflective listening, assertiveness, fair fighting rules, initiating conversations, and more as necessary and taught in session. STG: Cheria will be able to talk  about her brain injury without becoming overwhelmed with negative emotion and/or agitated 3 out of 4 sessions with therapist.  ProgressTowards Goals: Progressing  Interventions: Supportive, Reframing, and Other: processing re brain injury, loss of opportunity due to disability    Summary: April Reilly is a 54 y.o. 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  Patient presented as irritable and dysphoric throughout the session, a notable change from her typical affect, following a recent trip to [place]  to visit her boyfriend. She described multiple relational stressors with him that were distressing at the time of occurrence, though she reports they have since been addressed. Patient expressed significant disappointment after not being hired by the [employer] due to being classified as disabled, stating this experience negatively impacted her motivation to pursue other employment opportunities. With insight, she articulated a belief that obtaining the job would have altered how others perceive and treat her, particularly regarding her disability. Patient reported discussions with her daughters that resulted in a decision to remain in the current area this year rather than moving back to [place] . She expressed a desire to relocate locally to a residence without negative emotional associations and identified firm boundaries regarding housing preferences, including avoiding shared-wall apartments, unsafe neighborhoods, and predominantly white neighborhoods. Patient acknowledged needing assistance from her daughters in searching for housing and potentially financial support, noting this represents a role reversal from her longstanding pattern of providing support to them. She has not yet informed her boyfriend of her decision not to relocate and stated minimal concern about his reaction. Patient  emphasized a growing recognition  of the need to prioritize her own well-being, citing illness following exposure to her boyfriend as an example of past self-neglect. Therapist provided empathy, active listening, validation, and unconditional positive regard throughout discussion of interactions with her daughters, boyfriend, and neurologist.  Suicidal/Homicidal: No without intent/plan  Therapist Response:   Patient appears emotionally impacted by recent relational stressors, perceived stigma related to disability, and disappointment regarding employment. Mood was depressed and irritable, though insight was intact. Patient demonstrated emerging self-awareness and boundary-setting, particularly around prioritizing her own needs and challenging longstanding caregiving roles. No safety concerns were reported during session.     Plan/Recommendations:  Return to therapy at next scheduled appointment on 1/21, reflect on what was discussed in session, engage in self care behaviors as explored in session.  Continue individual therapy to support mood stabilization, identity and self-worth independent of external validation, and adaptive coping related to disability-related stigma. Explore housing transition stressors and assist with realistic planning and boundary-setting with family and partner. Reinforce self-care practices and assertive communication skills. Follow up next session to assess emotional adjustment and progress with housing and employment-related decisions.  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 Patient/Guardian  was advised if they have not already done so to contact the registration department to sign all necessary forms in order for us  to release information regarding their care.   Consent: Patient/Guardian gives verbal consent for treatment and assignment of benefits for services provided during this visit. Patient/Guardian expressed understanding and agreed to proceed.   [name], LCSW 08/03/2024

## 2024-08-11 ENCOUNTER — Encounter (HOSPITAL_COMMUNITY): Payer: Self-pay | Admitting: Clinical

## 2024-08-11 ENCOUNTER — Ambulatory Visit (HOSPITAL_COMMUNITY): Admitting: Clinical

## 2024-08-11 DIAGNOSIS — F064 Anxiety disorder due to known physiological condition: Secondary | ICD-10-CM

## 2024-08-11 DIAGNOSIS — S069XAD Unspecified intracranial injury with loss of consciousness status unknown, subsequent encounter: Secondary | ICD-10-CM

## 2024-08-11 DIAGNOSIS — F0631 Mood disorder due to known physiological condition with depressive features: Secondary | ICD-10-CM | POA: Diagnosis not present

## 2024-08-11 NOTE — Progress Notes (Unsigned)
 THERAPIST PROGRESS NOTE  Session Time:   4:15pm-5:01pm  Session #46  Virtual Visit via Video Note  I connected with April Reilly on 08/11/24 at  4:00 PM EST by a video enabled telemedicine application and verified that I am speaking with the correct person using two identifiers.  Location: Patient: home Provider: [facility] outpatient therapy office - [place]    I discussed the limitations of evaluation and management by telemedicine and the availability of in person appointments. The patient expressed understanding and agreed to proceed.   I discussed the assessment and treatment plan with the patient. The patient was provided an opportunity to ask questions and all were answered. The patient agreed with the plan and demonstrated an understanding of the instructions.   The patient was advised to call back or seek an in-person evaluation if the symptoms worsen or if the condition fails to improve as anticipated.  I provided 46 minutes of non-face-to-face time during this encounter.  [name], LCSW   Participation Level: Active  Behavioral Response: Casual Alert Euthymic   Type of Therapy: Individual Therapy   Treatment Goals addressed:  New treatment goals established, current goals reviewed:  LTG: April Reilly will be able to acknowledge and accept her new way of life with the brain injury 5 days out of 7 STG: Score less than 9 on the PHQ-9 and less than 5 on the GAD-7 as evidenced by intermittent administration of the questionnaires to determine progress in managing depression and anxiety.  STG: Learn breathing techniques and grounding techniques at an age-appropriate and ability-appropriate level and demonstrate mastery in session then report independent use of these skills out of session.    STG: Explore personal core beliefs, rules and assumptions, and cognitive distortions through therapist using Cognitive Behavioral Therapy; learn about replacement thoughts,  Behavioral Activation and Acting As If AEB using 2 times weekly. STG: Process life events to the extent needed so that will be able to move forward with various areas of life in a better frame of mind per self-report of improved satisfaction with life 5 out of 7 days over the next 6 months.  [facility]:Ozjmw and practice communication techniques such as active listening, I statements, open-ended questions, reflective listening, assertiveness, fair fighting rules, initiating conversations, and more as necessary and taught in session.  STG: Learn about boundary styles and types, how to implement them, and how to enforce them, then report feeling more empowered and content with being able to maintain more helpful, appropriate boundaries in the future for a more balanced result. LTG: Learn 10+ coping skills from CBT, Stages of Change, DBT, shame resilience theory, ACT, SFBT, MI, trauma-informed therapy, ERP, IFS, and forgiveness curriculum to be able to manage mental health symptoms, AEB home practice and reporting back.    LTG: Demonstrate improved emotional regulation by reporting a reduction in intensity or duration of distress during high-stress periods, as measured by self-report and session processing, over the next 3 months.  STG: Verbalize at least one self-care action taken each week to prioritize her physical or emotional wellbeing, as reported in session, for 4 consecutive sessions.   ProgressTowards Goals: Progressing  Interventions: Supportive and Other: check on treatment progress    Summary: April Reilly is a 54 y.o. 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  Patient reported she is still not feeling great and stated she remained at home for the entire week in an  effort to recover. She shared that she has not been speaking much with her boyfriend over the weekend, as she continues to feel angry with  him for exposing her to her current illness. Patient stated an intention to make caring for herself her primary focus this year. Session explored her reasons for this decision and ways to support increased self-care. When asked what she hopes to accomplish through therapy over the coming months and/or year, patient had difficulty articulating formal goals but was able to clearly identify the benefits she experiences from therapy. She stated therapy assists her in a variety of ways: makes me feel sane,  helps her feel heard,  provides a nonjudgmental space,  allows her to discuss life issues with trust, noting difficulty trusting many others, including family members changing my brain following prior brain-related issues,  provides strategies for managing life stressors,  and serves as a stabilizing influence during periods of intense distress by helping her regulate overwhelming thoughts and emotions.   Patient further shared that therapy has helped her reconnect with her sense of self after feeling she had lost herself.  Suicidal/Homicidal: No without intent/plan  Therapist Response:   Patient presents with low energy and ongoing physical and emotional distress but demonstrates insight, motivation for self-care, and strong engagement in treatment. Therapy continues to function as a stabilizing, grounding, and corrective relational experience. Patient benefits from validation, trust-building, cognitive support, and skills-based interventions. Difficulty identifying future goals appears related to fatigue and emotional depletion rather than lack of therapeutic progress. Protective factors include a strong therapeutic alliance, insight, and use of therapy to manage distressing thoughts.     Plan/Recommendations:  Return to therapy at next scheduled appointment on 1/27, reflect on what was discussed in session, engage in self care behaviors as explored in session.  Continue providing a consistent,  nonjudgmental therapeutic space emphasizing stabilization and validation.  Support patients focus on self-care and appropriate pacing during recovery.  Continue cognitive and skills-based interventions to support emotional regulation and coping with life stressors.  Gradually assist patient in translating identified therapy benefits into longer-term treatment goals as capacity improves.  Continue monitoring mood, stress tolerance, and overall functioning.   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 Patient/Guardian was advised if they have not already done so to contact the registration department to sign all necessary forms in order for us  to release information regarding their care.   Consent: Patient/Guardian gives verbal consent for treatment and assignment of benefits for services provided during this visit. Patient/Guardian expressed understanding and agreed to proceed.   [name], LCSW 08/11/2024

## 2024-08-17 ENCOUNTER — Encounter (HOSPITAL_COMMUNITY): Payer: Self-pay | Admitting: Clinical

## 2024-08-17 ENCOUNTER — Ambulatory Visit (INDEPENDENT_AMBULATORY_CARE_PROVIDER_SITE_OTHER): Admitting: Clinical

## 2024-08-17 DIAGNOSIS — F064 Anxiety disorder due to known physiological condition: Secondary | ICD-10-CM

## 2024-08-17 DIAGNOSIS — S069X0D Unspecified intracranial injury without loss of consciousness, subsequent encounter: Secondary | ICD-10-CM

## 2024-08-17 DIAGNOSIS — F0631 Mood disorder due to known physiological condition with depressive features: Secondary | ICD-10-CM

## 2024-08-17 NOTE — Progress Notes (Signed)
 THERAPIST PROGRESS NOTE  Session Time:   4:06pm-5:04pm  Session #47  Virtual Visit via Video Note  I connected with April Reilly on 08/17/24 at  4:00 PM EST by a video enabled telemedicine application and verified that I am speaking with the correct person using two identifiers.  Location: Patient: home Provider: home office   I discussed the limitations of evaluation and management by telemedicine and the availability of in person appointments. The patient expressed understanding and agreed to proceed.   I discussed the assessment and treatment plan with the patient. The patient was provided an opportunity to ask questions and all were answered. The patient agreed with the plan and demonstrated an understanding of the instructions.   The patient was advised to call back or seek an in-person evaluation if the symptoms worsen or if the condition fails to improve as anticipated.  I provided 58 minutes of non-face-to-face time during this encounter.  [name], LCSW   Participation Level: Active  Behavioral Response: Casual Alert Euthymic and Irritable   Type of Therapy: Individual Therapy   Treatment Goals addressed:  LTG: April Reilly will be able to acknowledge and accept her new way of life with the brain injury 5 days out of 7 STG: Score less than 9 on the PHQ-9 and less than 5 on the GAD-7 as evidenced by intermittent administration of the questionnaires to determine progress in managing depression and anxiety.  STG: Learn breathing techniques and grounding techniques at an age-appropriate and ability-appropriate level and demonstrate mastery in session then report independent use of these skills out of session.    STG: Explore personal core beliefs, rules and assumptions, and cognitive distortions through therapist using Cognitive Behavioral Therapy; learn about replacement thoughts, Behavioral Activation and Acting As If AEB using 2 times weekly. STG: Process life  events to the extent needed so that will be able to move forward with various areas of life in a better frame of mind per self-report of improved satisfaction with life 5 out of 7 days over the next 6 months.  OU[facility]:Ozjmw and practice communication techniques such as active listening, I statements, open-ended questions, reflective listening, assertiveness, fair fighting rules, initiating conversations, and more as necessary and taught in session.  STG: Learn about boundary styles and types, how to implement them, and how to enforce them, then report feeling more empowered and content with being able to maintain more helpful, appropriate boundaries in the future for a more balanced result. LTG: Learn 10+ coping skills from CBT, Stages of Change, DBT, shame resilience theory, ACT, SFBT, MI, trauma-informed therapy, ERP, IFS, and forgiveness curriculum to be able to manage mental health symptoms, AEB home practice and reporting back.    LTG: Demonstrate improved emotional regulation by reporting a reduction in intensity or duration of distress during high-stress periods, as measured by self-report and session processing, over the next 3 months.  STG: Verbalize at least one self-care action taken each week to prioritize her physical or emotional wellbeing, as reported in session, for 4 consecutive sessions.   ProgressTowards Goals: Progressing  Interventions: Supportive    Summary: April Reilly is a 54 y.o. 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  Patient reported feeling better, noting that she is eating more and coaching herself to get out of bed more frequently. She continues to experience migraines, which cause her worry. Patient stated that for the most part she  does not want to do anything; CSW provided psychoeducation about anhedonia and its relevance as a potential indicator of depression. Medication  was not discussed during this session but may be addressed in future sessions.  Patient expressed that she does not want to have a pity party and reflected on videos of her pre-stroke self, noting that she sees someone fun. Two of her daughters suggested she bake goods for [employer] to earn extra money, but she did not immediately engage in this activity as she has in the past. She reported spending at least one hour at a time, three times per day, in her room with the door closed and lights off, engaging in prayer, silence, and/or writing, which she described as very beneficial for her peace of mind. Patient noted that she is spending less time on the phone with her daughters, sisters, and boyfriend, preferring to use that time for quiet reflection. She has set boundaries by shutting down conversations involving gossip, stating she will not listen to it.  Patient was provided positive reinforcement for attending to both her mental and physical health. She also discussed an upcoming conversation with her daughter regarding moving out, stating, Its going to get ugly because thats the person she is. CSW and patient explored strategies for keeping the conversation as positive as possible  Suicidal/Homicidal: No without intent/plan  Therapist Response:   Patient demonstrates increased insight and motivation to prioritize mental health and self-care. She exhibits adaptive coping strategies, including structured quiet time, journaling, prayer, and boundary-setting, which appear to support emotional regulation. Mild depressive symptoms, including anhedonia and social withdrawal, persist but are being managed. Patient maintains reflective capacity and problem-solving skills, particularly regarding interpersonal challenges. No safety concerns reported.     Plan/Recommendations:  Return to therapy at next scheduled appointment on 2/12, reflect on what was discussed in session, engage in self care  behaviors as explored in session.  Continue therapy focused on depression management, coping skills, and boundary-setting. Monitor anhedonia, mood, and migraine symptoms. Support patient in preparation for challenging conversations with family members. Explore the potential role of medication in future sessions if depressive symptoms persist or worsen. Reinforce engagement in self-care routines, reflection, and structured quiet time.  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 Patient/Guardian was advised if they have not already done so to contact the registration department to sign all necessary forms in order for us  to release information regarding their care.   Consent: Patient/Guardian gives verbal consent for treatment and assignment of benefits for services provided during this visit. Patient/Guardian expressed understanding and agreed to proceed.   [name], LCSW 08/17/2024

## 2024-08-19 ENCOUNTER — Ambulatory Visit (HOSPITAL_COMMUNITY): Admitting: Clinical

## 2024-09-02 ENCOUNTER — Ambulatory Visit (HOSPITAL_COMMUNITY): Admitting: Clinical

## 2024-09-16 ENCOUNTER — Ambulatory Visit (HOSPITAL_COMMUNITY): Admitting: Clinical

## 2024-09-17 ENCOUNTER — Ambulatory Visit: Admitting: Neurology

## 2024-09-30 ENCOUNTER — Ambulatory Visit (HOSPITAL_COMMUNITY): Admitting: Clinical

## 2024-10-14 ENCOUNTER — Ambulatory Visit (HOSPITAL_COMMUNITY): Admitting: Clinical
